# Patient Record
Sex: Female | Born: 1935 | Race: Black or African American | Hispanic: No | Smoking: Never smoker
Health system: Southern US, Community
[De-identification: ages and names within clinical notes are randomized; demographics above are authoritative.]

## PROBLEM LIST (undated history)

## (undated) DIAGNOSIS — G4733 Obstructive sleep apnea (adult) (pediatric): Secondary | ICD-10-CM

## (undated) DIAGNOSIS — G4730 Sleep apnea, unspecified: Secondary | ICD-10-CM

## (undated) DIAGNOSIS — I639 Cerebral infarction, unspecified: Secondary | ICD-10-CM

## (undated) DIAGNOSIS — D649 Anemia, unspecified: Secondary | ICD-10-CM

## (undated) DIAGNOSIS — E119 Type 2 diabetes mellitus without complications: Secondary | ICD-10-CM

## (undated) DIAGNOSIS — I272 Pulmonary hypertension, unspecified: Secondary | ICD-10-CM

## (undated) DIAGNOSIS — G43909 Migraine, unspecified, not intractable, without status migrainosus: Secondary | ICD-10-CM

## (undated) DIAGNOSIS — F329 Major depressive disorder, single episode, unspecified: Secondary | ICD-10-CM

## (undated) DIAGNOSIS — M1990 Unspecified osteoarthritis, unspecified site: Secondary | ICD-10-CM

## (undated) DIAGNOSIS — I10 Essential (primary) hypertension: Secondary | ICD-10-CM

## (undated) DIAGNOSIS — M545 Low back pain, unspecified: Secondary | ICD-10-CM

## (undated) DIAGNOSIS — C50911 Malignant neoplasm of unspecified site of right female breast: Secondary | ICD-10-CM

## (undated) DIAGNOSIS — F32A Depression, unspecified: Secondary | ICD-10-CM

## (undated) DIAGNOSIS — I739 Peripheral vascular disease, unspecified: Secondary | ICD-10-CM

## (undated) DIAGNOSIS — Z950 Presence of cardiac pacemaker: Secondary | ICD-10-CM

## (undated) DIAGNOSIS — M810 Age-related osteoporosis without current pathological fracture: Secondary | ICD-10-CM

## (undated) DIAGNOSIS — G8929 Other chronic pain: Secondary | ICD-10-CM

## (undated) DIAGNOSIS — Z9989 Dependence on other enabling machines and devices: Principal | ICD-10-CM

## (undated) DIAGNOSIS — E785 Hyperlipidemia, unspecified: Secondary | ICD-10-CM

## (undated) DIAGNOSIS — F419 Anxiety disorder, unspecified: Secondary | ICD-10-CM

## (undated) DIAGNOSIS — N952 Postmenopausal atrophic vaginitis: Secondary | ICD-10-CM

## (undated) HISTORY — DX: Postmenopausal atrophic vaginitis: N95.2

## (undated) HISTORY — PX: CATARACT EXTRACTION W/ INTRAOCULAR LENS  IMPLANT, BILATERAL: SHX1307

## (undated) HISTORY — PX: BREAST LUMPECTOMY: SHX2

## (undated) HISTORY — DX: Anemia, unspecified: D64.9

## (undated) HISTORY — DX: Age-related osteoporosis without current pathological fracture: M81.0

## (undated) HISTORY — DX: Pulmonary hypertension, unspecified: I27.20

## (undated) HISTORY — DX: Cerebral infarction, unspecified: I63.9

## (undated) HISTORY — DX: Sleep apnea, unspecified: G47.30

## (undated) HISTORY — DX: Hyperlipidemia, unspecified: E78.5

## (undated) HISTORY — DX: Obstructive sleep apnea (adult) (pediatric): G47.33

## (undated) HISTORY — DX: Dependence on other enabling machines and devices: Z99.89

## (undated) HISTORY — DX: Peripheral vascular disease, unspecified: I73.9

---

## 1999-12-24 DIAGNOSIS — C50911 Malignant neoplasm of unspecified site of right female breast: Secondary | ICD-10-CM

## 1999-12-24 HISTORY — DX: Malignant neoplasm of unspecified site of right female breast: C50.911

## 2000-07-26 HISTORY — PX: BREAST LUMPECTOMY: SHX2

## 2000-10-31 ENCOUNTER — Encounter: Admission: RE | Admit: 2000-10-31 | Discharge: 2000-10-31 | Payer: Self-pay | Admitting: General Surgery

## 2000-10-31 ENCOUNTER — Other Ambulatory Visit: Admission: RE | Admit: 2000-10-31 | Discharge: 2000-10-31 | Payer: Self-pay | Admitting: General Surgery

## 2000-10-31 ENCOUNTER — Encounter: Payer: Self-pay | Admitting: General Surgery

## 2000-10-31 ENCOUNTER — Encounter (INDEPENDENT_AMBULATORY_CARE_PROVIDER_SITE_OTHER): Payer: Self-pay | Admitting: Specialist

## 2000-11-11 ENCOUNTER — Encounter: Payer: Self-pay | Admitting: General Surgery

## 2000-11-11 ENCOUNTER — Ambulatory Visit (HOSPITAL_BASED_OUTPATIENT_CLINIC_OR_DEPARTMENT_OTHER): Admission: RE | Admit: 2000-11-11 | Discharge: 2000-11-12 | Payer: Self-pay | Admitting: General Surgery

## 2000-11-11 ENCOUNTER — Encounter (INDEPENDENT_AMBULATORY_CARE_PROVIDER_SITE_OTHER): Payer: Self-pay | Admitting: *Deleted

## 2001-05-05 ENCOUNTER — Ambulatory Visit: Admission: RE | Admit: 2001-05-05 | Discharge: 2001-08-03 | Payer: Self-pay | Admitting: Radiation Oncology

## 2001-06-29 ENCOUNTER — Ambulatory Visit (HOSPITAL_COMMUNITY): Admission: RE | Admit: 2001-06-29 | Discharge: 2001-06-29 | Payer: Self-pay | Admitting: Radiation Oncology

## 2001-09-10 ENCOUNTER — Encounter: Payer: Self-pay | Admitting: *Deleted

## 2001-09-10 ENCOUNTER — Encounter: Admission: RE | Admit: 2001-09-10 | Discharge: 2001-09-10 | Payer: Self-pay | Admitting: *Deleted

## 2001-10-27 ENCOUNTER — Encounter: Payer: Self-pay | Admitting: *Deleted

## 2001-10-27 ENCOUNTER — Encounter: Admission: RE | Admit: 2001-10-27 | Discharge: 2001-10-27 | Payer: Self-pay | Admitting: *Deleted

## 2002-04-27 ENCOUNTER — Encounter: Admission: RE | Admit: 2002-04-27 | Discharge: 2002-04-27 | Payer: Self-pay | Admitting: Internal Medicine

## 2002-04-27 ENCOUNTER — Encounter (HOSPITAL_BASED_OUTPATIENT_CLINIC_OR_DEPARTMENT_OTHER): Payer: Self-pay | Admitting: Internal Medicine

## 2002-11-02 ENCOUNTER — Encounter: Payer: Self-pay | Admitting: *Deleted

## 2002-11-02 ENCOUNTER — Encounter: Admission: RE | Admit: 2002-11-02 | Discharge: 2002-11-02 | Payer: Self-pay | Admitting: *Deleted

## 2003-01-26 ENCOUNTER — Encounter: Payer: Self-pay | Admitting: *Deleted

## 2003-01-26 ENCOUNTER — Ambulatory Visit (HOSPITAL_COMMUNITY): Admission: RE | Admit: 2003-01-26 | Discharge: 2003-01-26 | Payer: Self-pay | Admitting: *Deleted

## 2003-11-04 ENCOUNTER — Encounter: Admission: RE | Admit: 2003-11-04 | Discharge: 2003-11-04 | Payer: Self-pay | Admitting: Oncology

## 2004-04-19 ENCOUNTER — Other Ambulatory Visit: Admission: RE | Admit: 2004-04-19 | Discharge: 2004-04-19 | Payer: Self-pay | Admitting: Obstetrics and Gynecology

## 2004-09-24 ENCOUNTER — Encounter: Admission: RE | Admit: 2004-09-24 | Discharge: 2004-09-24 | Payer: Self-pay | Admitting: Oncology

## 2004-09-27 ENCOUNTER — Ambulatory Visit (HOSPITAL_COMMUNITY): Admission: RE | Admit: 2004-09-27 | Discharge: 2004-09-27 | Payer: Self-pay | Admitting: Oncology

## 2004-10-04 ENCOUNTER — Encounter: Admission: RE | Admit: 2004-10-04 | Discharge: 2004-10-04 | Payer: Self-pay | Admitting: Oncology

## 2004-10-16 ENCOUNTER — Other Ambulatory Visit: Admission: RE | Admit: 2004-10-16 | Discharge: 2004-10-16 | Payer: Self-pay | Admitting: Obstetrics and Gynecology

## 2004-10-25 ENCOUNTER — Ambulatory Visit: Payer: Self-pay | Admitting: Oncology

## 2004-11-05 ENCOUNTER — Encounter: Admission: RE | Admit: 2004-11-05 | Discharge: 2004-11-05 | Payer: Self-pay | Admitting: Oncology

## 2004-12-13 ENCOUNTER — Ambulatory Visit: Payer: Self-pay | Admitting: Oncology

## 2005-04-10 ENCOUNTER — Ambulatory Visit: Payer: Self-pay | Admitting: Hematology & Oncology

## 2005-04-22 ENCOUNTER — Other Ambulatory Visit: Admission: RE | Admit: 2005-04-22 | Discharge: 2005-04-22 | Payer: Self-pay | Admitting: Obstetrics and Gynecology

## 2005-06-14 ENCOUNTER — Encounter: Admission: RE | Admit: 2005-06-14 | Discharge: 2005-06-14 | Payer: Self-pay | Admitting: Internal Medicine

## 2005-11-01 ENCOUNTER — Ambulatory Visit: Payer: Self-pay | Admitting: Hematology & Oncology

## 2005-11-15 ENCOUNTER — Encounter: Admission: RE | Admit: 2005-11-15 | Discharge: 2005-11-15 | Payer: Self-pay | Admitting: Hematology & Oncology

## 2006-01-03 ENCOUNTER — Other Ambulatory Visit: Admission: RE | Admit: 2006-01-03 | Discharge: 2006-01-03 | Payer: Self-pay | Admitting: Obstetrics and Gynecology

## 2006-05-01 ENCOUNTER — Ambulatory Visit: Payer: Self-pay | Admitting: Hematology & Oncology

## 2006-05-05 LAB — CBC WITH DIFFERENTIAL/PLATELET
Basophils Absolute: 0 10*3/uL (ref 0.0–0.1)
Eosinophils Absolute: 0.2 10*3/uL (ref 0.0–0.5)
HGB: 11.6 g/dL (ref 11.6–15.9)
LYMPH%: 6.2 % — ABNORMAL LOW (ref 14.0–48.0)
MCV: 88.6 fL (ref 81.0–101.0)
MONO%: 8 % (ref 0.0–13.0)
NEUT#: 6.6 10*3/uL — ABNORMAL HIGH (ref 1.5–6.5)
Platelets: 230 10*3/uL (ref 145–400)
RBC: 3.87 10*6/uL (ref 3.70–5.32)

## 2006-05-05 LAB — COMPREHENSIVE METABOLIC PANEL
Alkaline Phosphatase: 47 U/L (ref 39–117)
BUN: 26 mg/dL — ABNORMAL HIGH (ref 6–23)
Glucose, Bld: 163 mg/dL — ABNORMAL HIGH (ref 70–99)
Total Bilirubin: 0.4 mg/dL (ref 0.3–1.2)

## 2006-05-06 LAB — ABO AND RH: Rh Type: POSITIVE

## 2006-05-26 LAB — CBC WITH DIFFERENTIAL/PLATELET
BASO%: 0.5 % (ref 0.0–2.0)
LYMPH%: 17.9 % (ref 14.0–48.0)
MCHC: 33.1 g/dL (ref 32.0–36.0)
MCV: 90.7 fL (ref 81.0–101.0)
MONO%: 7.5 % (ref 0.0–13.0)
Platelets: 256 10*3/uL (ref 145–400)
RBC: 4.05 10*6/uL (ref 3.70–5.32)

## 2006-06-16 ENCOUNTER — Ambulatory Visit: Payer: Self-pay | Admitting: Hematology & Oncology

## 2006-06-16 LAB — CBC WITH DIFFERENTIAL/PLATELET
BASO%: 0.4 % (ref 0.0–2.0)
EOS%: 2 % (ref 0.0–7.0)
LYMPH%: 18 % (ref 14.0–48.0)
MCH: 30 pg (ref 26.0–34.0)
MCHC: 33.6 g/dL (ref 32.0–36.0)
MONO#: 0.5 10*3/uL (ref 0.1–0.9)
Platelets: 248 10*3/uL (ref 145–400)
RBC: 3.97 10*6/uL (ref 3.70–5.32)
WBC: 6.7 10*3/uL (ref 3.9–10.0)
lymph#: 1.2 10*3/uL (ref 0.9–3.3)

## 2006-07-07 LAB — CBC WITH DIFFERENTIAL/PLATELET
Basophils Absolute: 0 10*3/uL (ref 0.0–0.1)
Eosinophils Absolute: 0.2 10*3/uL (ref 0.0–0.5)
HGB: 12.3 g/dL (ref 11.6–15.9)
LYMPH%: 20.8 % (ref 14.0–48.0)
MCV: 90.9 fL (ref 81.0–101.0)
MONO%: 9 % (ref 0.0–13.0)
NEUT#: 3.6 10*3/uL (ref 1.5–6.5)
NEUT%: 66.6 % (ref 39.6–76.8)
Platelets: 243 10*3/uL (ref 145–400)

## 2006-07-28 ENCOUNTER — Ambulatory Visit: Payer: Self-pay | Admitting: Hematology & Oncology

## 2006-07-28 LAB — CBC WITH DIFFERENTIAL/PLATELET
BASO%: 0.4 % (ref 0.0–2.0)
EOS%: 2.1 % (ref 0.0–7.0)
MCH: 30.1 pg (ref 26.0–34.0)
MCHC: 33.5 g/dL (ref 32.0–36.0)
RBC: 3.96 10*6/uL (ref 3.70–5.32)
RDW: 15.1 % — ABNORMAL HIGH (ref 11.3–14.5)
WBC: 7.3 10*3/uL (ref 3.9–10.0)
lymph#: 1.1 10*3/uL (ref 0.9–3.3)

## 2006-08-18 LAB — CBC WITH DIFFERENTIAL/PLATELET
Basophils Absolute: 0 10*3/uL (ref 0.0–0.1)
EOS%: 2.4 % (ref 0.0–7.0)
Eosinophils Absolute: 0.1 10*3/uL (ref 0.0–0.5)
HGB: 12.6 g/dL (ref 11.6–15.9)
MCH: 30.3 pg (ref 26.0–34.0)
MONO#: 0.5 10*3/uL (ref 0.1–0.9)
NEUT#: 4.2 10*3/uL (ref 1.5–6.5)
RDW: 15.4 % — ABNORMAL HIGH (ref 11.3–14.5)
WBC: 6.1 10*3/uL (ref 3.9–10.0)
lymph#: 1.2 10*3/uL (ref 0.9–3.3)

## 2006-09-08 LAB — CBC WITH DIFFERENTIAL/PLATELET
Basophils Absolute: 0 10*3/uL (ref 0.0–0.1)
Eosinophils Absolute: 0.2 10*3/uL (ref 0.0–0.5)
HGB: 11.9 g/dL (ref 11.6–15.9)
MCV: 89.9 fL (ref 81.0–101.0)
MONO%: 7.4 % (ref 0.0–13.0)
NEUT#: 5.5 10*3/uL (ref 1.5–6.5)
RDW: 15.1 % — ABNORMAL HIGH (ref 11.3–14.5)

## 2006-09-25 ENCOUNTER — Ambulatory Visit: Payer: Self-pay | Admitting: Hematology & Oncology

## 2006-10-20 LAB — CBC WITH DIFFERENTIAL/PLATELET
BASO%: 0.3 % (ref 0.0–2.0)
EOS%: 2.5 % (ref 0.0–7.0)
HCT: 36.4 % (ref 34.8–46.6)
LYMPH%: 15 % (ref 14.0–48.0)
MCH: 30.2 pg (ref 26.0–34.0)
MCHC: 33.7 g/dL (ref 32.0–36.0)
NEUT%: 76.4 % (ref 39.6–76.8)
Platelets: 243 10*3/uL (ref 145–400)
RBC: 4.06 10*6/uL (ref 3.70–5.32)

## 2006-11-06 ENCOUNTER — Ambulatory Visit: Payer: Self-pay | Admitting: Hematology & Oncology

## 2006-11-10 LAB — CBC WITH DIFFERENTIAL/PLATELET
BASO%: 0.5 % (ref 0.0–2.0)
HCT: 35 % (ref 34.8–46.6)
LYMPH%: 18.2 % (ref 14.0–48.0)
MCHC: 33.9 g/dL (ref 32.0–36.0)
MCV: 89.1 fL (ref 81.0–101.0)
MONO#: 0.6 10*3/uL (ref 0.1–0.9)
MONO%: 9.4 % (ref 0.0–13.0)
NEUT%: 68.8 % (ref 39.6–76.8)
Platelets: 239 10*3/uL (ref 145–400)
RBC: 3.93 10*6/uL (ref 3.70–5.32)
WBC: 6.1 10*3/uL (ref 3.9–10.0)

## 2006-11-17 ENCOUNTER — Encounter: Admission: RE | Admit: 2006-11-17 | Discharge: 2006-11-17 | Payer: Self-pay | Admitting: Internal Medicine

## 2006-12-01 LAB — CBC WITH DIFFERENTIAL/PLATELET
Basophils Absolute: 0 10*3/uL (ref 0.0–0.1)
Eosinophils Absolute: 0.2 10*3/uL (ref 0.0–0.5)
HGB: 12.2 g/dL (ref 11.6–15.9)
MCV: 90.6 fL (ref 81.0–101.0)
MONO#: 0.7 10*3/uL (ref 0.1–0.9)
NEUT#: 5.5 10*3/uL (ref 1.5–6.5)
RBC: 4.08 10*6/uL (ref 3.70–5.32)
RDW: 15.4 % — ABNORMAL HIGH (ref 11.3–14.5)
WBC: 7.7 10*3/uL (ref 3.9–10.0)
lymph#: 1.3 10*3/uL (ref 0.9–3.3)

## 2006-12-17 ENCOUNTER — Ambulatory Visit: Payer: Self-pay | Admitting: Hematology & Oncology

## 2006-12-22 LAB — CBC WITH DIFFERENTIAL/PLATELET
Basophils Absolute: 0 10*3/uL (ref 0.0–0.1)
Eosinophils Absolute: 0.2 10*3/uL (ref 0.0–0.5)
HCT: 35.5 % (ref 34.8–46.6)
HGB: 11.8 g/dL (ref 11.6–15.9)
LYMPH%: 13.5 % — ABNORMAL LOW (ref 14.0–48.0)
MCV: 89.6 fL (ref 81.0–101.0)
MONO#: 0.6 10*3/uL (ref 0.1–0.9)
MONO%: 6.7 % (ref 0.0–13.0)
NEUT#: 6.4 10*3/uL (ref 1.5–6.5)
NEUT%: 76.6 % (ref 39.6–76.8)
Platelets: 237 10*3/uL (ref 145–400)
RBC: 3.96 10*6/uL (ref 3.70–5.32)
WBC: 8.4 10*3/uL (ref 3.9–10.0)

## 2007-01-15 LAB — CBC WITH DIFFERENTIAL/PLATELET
BASO%: 0.4 % (ref 0.0–2.0)
Basophils Absolute: 0 10*3/uL (ref 0.0–0.1)
HCT: 39.4 % (ref 34.8–46.6)
HGB: 13.2 g/dL (ref 11.6–15.9)
MONO#: 0.6 10*3/uL (ref 0.1–0.9)
NEUT#: 5.3 10*3/uL (ref 1.5–6.5)
NEUT%: 74.8 % (ref 39.6–76.8)
WBC: 7.1 10*3/uL (ref 3.9–10.0)
lymph#: 1 10*3/uL (ref 0.9–3.3)

## 2007-01-29 ENCOUNTER — Ambulatory Visit: Payer: Self-pay | Admitting: Hematology & Oncology

## 2007-02-02 LAB — CBC WITH DIFFERENTIAL/PLATELET
Basophils Absolute: 0.1 10*3/uL (ref 0.0–0.1)
EOS%: 3.3 % (ref 0.0–7.0)
HCT: 39.5 % (ref 34.8–46.6)
HGB: 13.1 g/dL (ref 11.6–15.9)
MCH: 29.8 pg (ref 26.0–34.0)
MCV: 90.1 fL (ref 81.0–101.0)
NEUT%: 71.3 % (ref 39.6–76.8)
lymph#: 1.4 10*3/uL (ref 0.9–3.3)

## 2007-02-23 LAB — CBC WITH DIFFERENTIAL/PLATELET
Basophils Absolute: 0 10*3/uL (ref 0.0–0.1)
EOS%: 4.1 % (ref 0.0–7.0)
HGB: 12 g/dL (ref 11.6–15.9)
LYMPH%: 19 % (ref 14.0–48.0)
MCH: 29.4 pg (ref 26.0–34.0)
MCV: 88 fL (ref 81.0–101.0)
MONO%: 10.1 % (ref 0.0–13.0)
NEUT%: 66.3 % (ref 39.6–76.8)
Platelets: 217 10*3/uL (ref 145–400)
RDW: 15.1 % — ABNORMAL HIGH (ref 11.3–14.5)

## 2007-02-24 ENCOUNTER — Ambulatory Visit: Payer: Self-pay | Admitting: *Deleted

## 2007-04-30 ENCOUNTER — Ambulatory Visit: Payer: Self-pay | Admitting: Hematology & Oncology

## 2007-05-04 LAB — CBC WITH DIFFERENTIAL/PLATELET
BASO%: 0.4 % (ref 0.0–2.0)
Basophils Absolute: 0 10*3/uL (ref 0.0–0.1)
EOS%: 2.6 % (ref 0.0–7.0)
HGB: 11.9 g/dL (ref 11.6–15.9)
MCH: 29.8 pg (ref 26.0–34.0)
NEUT#: 6.9 10*3/uL — ABNORMAL HIGH (ref 1.5–6.5)
RBC: 3.98 10*6/uL (ref 3.70–5.32)
RDW: 14.7 % — ABNORMAL HIGH (ref 11.3–14.5)
lymph#: 1 10*3/uL (ref 0.9–3.3)

## 2007-05-04 LAB — COMPREHENSIVE METABOLIC PANEL
ALT: 9 U/L (ref 0–35)
AST: 16 U/L (ref 0–37)
Albumin: 4.4 g/dL (ref 3.5–5.2)
Alkaline Phosphatase: 52 U/L (ref 39–117)
BUN: 22 mg/dL (ref 6–23)
Calcium: 9.9 mg/dL (ref 8.4–10.5)
Chloride: 104 mEq/L (ref 96–112)
Potassium: 3.9 mEq/L (ref 3.5–5.3)
Sodium: 140 mEq/L (ref 135–145)
Total Protein: 7.7 g/dL (ref 6.0–8.3)

## 2007-11-03 ENCOUNTER — Emergency Department (HOSPITAL_COMMUNITY): Admission: EM | Admit: 2007-11-03 | Discharge: 2007-11-03 | Payer: Self-pay | Admitting: Emergency Medicine

## 2007-11-24 ENCOUNTER — Encounter: Admission: RE | Admit: 2007-11-24 | Discharge: 2007-11-24 | Payer: Self-pay | Admitting: Internal Medicine

## 2007-11-26 ENCOUNTER — Ambulatory Visit: Payer: Self-pay | Admitting: Hematology & Oncology

## 2007-11-26 LAB — COMPREHENSIVE METABOLIC PANEL
AST: 15 U/L (ref 0–37)
Albumin: 4.2 g/dL (ref 3.5–5.2)
Alkaline Phosphatase: 51 U/L (ref 39–117)
Glucose, Bld: 130 mg/dL — ABNORMAL HIGH (ref 70–99)
Potassium: 4.2 mEq/L (ref 3.5–5.3)
Sodium: 142 mEq/L (ref 135–145)
Total Bilirubin: 0.5 mg/dL (ref 0.3–1.2)
Total Protein: 7.9 g/dL (ref 6.0–8.3)

## 2007-11-26 LAB — CBC WITH DIFFERENTIAL/PLATELET
EOS%: 2.8 % (ref 0.0–7.0)
LYMPH%: 15.7 % (ref 14.0–48.0)
MCH: 29.2 pg (ref 26.0–34.0)
MCHC: 33.3 g/dL (ref 32.0–36.0)
MCV: 87.7 fL (ref 81.0–101.0)
MONO%: 7.1 % (ref 0.0–13.0)
Platelets: 223 10*3/uL (ref 145–400)
RBC: 3.99 10*6/uL (ref 3.70–5.32)
RDW: 14.9 % — ABNORMAL HIGH (ref 11.3–14.5)

## 2007-12-07 ENCOUNTER — Ambulatory Visit (HOSPITAL_COMMUNITY): Admission: RE | Admit: 2007-12-07 | Discharge: 2007-12-07 | Payer: Self-pay | Admitting: Hematology & Oncology

## 2008-02-04 ENCOUNTER — Ambulatory Visit: Payer: Self-pay | Admitting: Hematology & Oncology

## 2008-02-13 ENCOUNTER — Encounter: Admission: RE | Admit: 2008-02-13 | Discharge: 2008-02-13 | Payer: Self-pay | Admitting: Internal Medicine

## 2008-02-21 ENCOUNTER — Emergency Department (HOSPITAL_COMMUNITY): Admission: EM | Admit: 2008-02-21 | Discharge: 2008-02-21 | Payer: Self-pay | Admitting: Emergency Medicine

## 2008-04-12 ENCOUNTER — Other Ambulatory Visit: Admission: RE | Admit: 2008-04-12 | Discharge: 2008-04-12 | Payer: Self-pay | Admitting: Obstetrics & Gynecology

## 2008-05-21 ENCOUNTER — Encounter: Admission: RE | Admit: 2008-05-21 | Discharge: 2008-05-21 | Payer: Self-pay | Admitting: Orthopedic Surgery

## 2008-05-31 ENCOUNTER — Ambulatory Visit: Payer: Self-pay | Admitting: Hematology & Oncology

## 2008-06-02 LAB — CBC WITH DIFFERENTIAL/PLATELET
Basophils Absolute: 0.1 10*3/uL (ref 0.0–0.1)
Eosinophils Absolute: 0.3 10*3/uL (ref 0.0–0.5)
HGB: 12.3 g/dL (ref 11.6–15.9)
LYMPH%: 22.7 % (ref 14.0–48.0)
MCV: 89.6 fL (ref 81.0–101.0)
MONO#: 0.7 10*3/uL (ref 0.1–0.9)
MONO%: 10 % (ref 0.0–13.0)
NEUT#: 4.2 10*3/uL (ref 1.5–6.5)
Platelets: 198 10*3/uL (ref 145–400)
RDW: 13.7 % (ref 11.3–14.5)
WBC: 6.8 10*3/uL (ref 3.9–10.0)

## 2008-06-02 LAB — COMPREHENSIVE METABOLIC PANEL
Albumin: 4.4 g/dL (ref 3.5–5.2)
Alkaline Phosphatase: 52 U/L (ref 39–117)
BUN: 31 mg/dL — ABNORMAL HIGH (ref 6–23)
CO2: 22 mEq/L (ref 19–32)
Glucose, Bld: 111 mg/dL — ABNORMAL HIGH (ref 70–99)
Potassium: 4.3 mEq/L (ref 3.5–5.3)
Sodium: 140 mEq/L (ref 135–145)
Total Protein: 7.7 g/dL (ref 6.0–8.3)

## 2008-06-17 ENCOUNTER — Encounter: Admission: RE | Admit: 2008-06-17 | Discharge: 2008-06-17 | Payer: Self-pay | Admitting: Internal Medicine

## 2008-07-06 ENCOUNTER — Encounter: Admission: RE | Admit: 2008-07-06 | Discharge: 2008-07-06 | Payer: Self-pay | Admitting: Internal Medicine

## 2008-07-19 ENCOUNTER — Encounter: Admission: RE | Admit: 2008-07-19 | Discharge: 2008-07-19 | Payer: Self-pay | Admitting: Internal Medicine

## 2008-08-15 ENCOUNTER — Ambulatory Visit: Payer: Self-pay | Admitting: Hematology & Oncology

## 2008-08-15 LAB — CBC WITH DIFFERENTIAL/PLATELET
Basophils Absolute: 0 10*3/uL (ref 0.0–0.1)
Eosinophils Absolute: 0.1 10*3/uL (ref 0.0–0.5)
HGB: 12 g/dL (ref 11.6–15.9)
MONO#: 0.7 10*3/uL (ref 0.1–0.9)
NEUT#: 4.6 10*3/uL (ref 1.5–6.5)
RBC: 3.82 10*6/uL (ref 3.70–5.32)
RDW: 15.2 % — ABNORMAL HIGH (ref 11.3–14.5)
WBC: 6.6 10*3/uL (ref 3.9–10.0)
lymph#: 1.2 10*3/uL (ref 0.9–3.3)

## 2008-11-29 ENCOUNTER — Encounter: Admission: RE | Admit: 2008-11-29 | Discharge: 2008-11-29 | Payer: Self-pay | Admitting: Internal Medicine

## 2008-12-23 HISTORY — PX: COLONOSCOPY: SHX174

## 2009-04-18 ENCOUNTER — Other Ambulatory Visit: Admission: RE | Admit: 2009-04-18 | Discharge: 2009-04-18 | Payer: Self-pay | Admitting: Obstetrics & Gynecology

## 2009-12-01 ENCOUNTER — Encounter: Admission: RE | Admit: 2009-12-01 | Discharge: 2009-12-01 | Payer: Self-pay | Admitting: Internal Medicine

## 2010-03-02 ENCOUNTER — Encounter: Admission: RE | Admit: 2010-03-02 | Discharge: 2010-03-02 | Payer: Self-pay | Admitting: Internal Medicine

## 2010-04-25 LAB — HM PAP SMEAR: HM Pap smear: NEGATIVE

## 2010-05-01 ENCOUNTER — Encounter: Admission: RE | Admit: 2010-05-01 | Discharge: 2010-06-19 | Payer: Self-pay | Admitting: Neurology

## 2010-12-04 ENCOUNTER — Encounter
Admission: RE | Admit: 2010-12-04 | Discharge: 2010-12-04 | Payer: Self-pay | Source: Home / Self Care | Attending: Internal Medicine | Admitting: Internal Medicine

## 2011-04-21 ENCOUNTER — Emergency Department (HOSPITAL_COMMUNITY): Payer: Medicare Other

## 2011-04-21 ENCOUNTER — Emergency Department (HOSPITAL_COMMUNITY)
Admission: EM | Admit: 2011-04-21 | Discharge: 2011-04-21 | Disposition: A | Discharge status: Home / Self Care | Payer: Medicare Other | Attending: Emergency Medicine | Admitting: Emergency Medicine

## 2011-04-21 DIAGNOSIS — R0602 Shortness of breath: Secondary | ICD-10-CM | POA: Insufficient documentation

## 2011-04-21 DIAGNOSIS — R079 Chest pain, unspecified: Secondary | ICD-10-CM | POA: Insufficient documentation

## 2011-04-21 DIAGNOSIS — M25519 Pain in unspecified shoulder: Secondary | ICD-10-CM | POA: Insufficient documentation

## 2011-04-21 DIAGNOSIS — M62838 Other muscle spasm: Secondary | ICD-10-CM | POA: Insufficient documentation

## 2011-04-21 DIAGNOSIS — R109 Unspecified abdominal pain: Secondary | ICD-10-CM | POA: Insufficient documentation

## 2011-04-21 DIAGNOSIS — Z853 Personal history of malignant neoplasm of breast: Secondary | ICD-10-CM | POA: Insufficient documentation

## 2011-04-21 DIAGNOSIS — E119 Type 2 diabetes mellitus without complications: Secondary | ICD-10-CM | POA: Insufficient documentation

## 2011-04-21 DIAGNOSIS — M549 Dorsalgia, unspecified: Secondary | ICD-10-CM | POA: Insufficient documentation

## 2011-04-21 DIAGNOSIS — I10 Essential (primary) hypertension: Secondary | ICD-10-CM | POA: Insufficient documentation

## 2011-04-21 LAB — CBC
Hemoglobin: 12.9 g/dL (ref 12.0–15.0)
MCHC: 33.9 g/dL (ref 30.0–36.0)
Platelets: 223 10*3/uL (ref 150–400)
RDW: 14.3 % (ref 11.5–15.5)

## 2011-04-21 LAB — URINALYSIS, ROUTINE W REFLEX MICROSCOPIC
Ketones, ur: NEGATIVE mg/dL
Nitrite: NEGATIVE
Specific Gravity, Urine: 1.013 (ref 1.005–1.030)
pH: 7 (ref 5.0–8.0)

## 2011-04-21 LAB — DIFFERENTIAL
Basophils Absolute: 0 10*3/uL (ref 0.0–0.1)
Eosinophils Absolute: 0.2 10*3/uL (ref 0.0–0.7)
Lymphs Abs: 1.3 10*3/uL (ref 0.7–4.0)
Monocytes Relative: 9 % (ref 3–12)

## 2011-04-21 LAB — BASIC METABOLIC PANEL
CO2: 26 mEq/L (ref 19–32)
Calcium: 9.7 mg/dL (ref 8.4–10.5)
GFR calc Af Amer: 56 mL/min — ABNORMAL LOW (ref >60)
GFR calc non Af Amer: 46 mL/min — ABNORMAL LOW (ref >60)
Sodium: 139 mEq/L (ref 135–145)

## 2011-04-21 MED ORDER — IOHEXOL 350 MG/ML SOLN
100.0000 mL | Freq: Once | INTRAVENOUS | Status: AC | PRN
  Administered 2011-04-21: 100 mL via INTRAVENOUS

## 2011-06-24 ENCOUNTER — Encounter: Payer: Self-pay | Admitting: Podiatry

## 2011-06-24 DIAGNOSIS — E119 Type 2 diabetes mellitus without complications: Secondary | ICD-10-CM | POA: Insufficient documentation

## 2011-09-16 LAB — CBC
HCT: 33.6 — ABNORMAL LOW
MCV: 88
Platelets: 184
RDW: 16 — ABNORMAL HIGH

## 2011-09-16 LAB — URINALYSIS, ROUTINE W REFLEX MICROSCOPIC
Hgb urine dipstick: NEGATIVE
Nitrite: NEGATIVE
Specific Gravity, Urine: 1.008
Urobilinogen, UA: 0.2
pH: 5.5

## 2011-09-16 LAB — DIFFERENTIAL
Basophils Absolute: 0
Lymphocytes Relative: 13
Lymphs Abs: 0.7
Monocytes Absolute: 0.7
Monocytes Relative: 14 — ABNORMAL HIGH
Neutro Abs: 3.6

## 2011-09-16 LAB — COMPREHENSIVE METABOLIC PANEL
Albumin: 3.2 — ABNORMAL LOW
BUN: 20
Calcium: 8.6
Creatinine, Ser: 1.37 — ABNORMAL HIGH
GFR calc Af Amer: 45 — ABNORMAL LOW
Total Protein: 6.9

## 2011-09-16 LAB — URINE CULTURE: Colony Count: 1000

## 2011-10-30 ENCOUNTER — Other Ambulatory Visit: Payer: Self-pay | Admitting: Internal Medicine

## 2011-11-05 ENCOUNTER — Other Ambulatory Visit: Payer: Self-pay | Admitting: Internal Medicine

## 2011-11-05 DIAGNOSIS — N644 Mastodynia: Secondary | ICD-10-CM

## 2011-12-13 DIAGNOSIS — I779 Disorder of arteries and arterioles, unspecified: Secondary | ICD-10-CM

## 2011-12-13 HISTORY — DX: Disorder of arteries and arterioles, unspecified: I77.9

## 2011-12-27 DIAGNOSIS — E782 Mixed hyperlipidemia: Secondary | ICD-10-CM | POA: Diagnosis not present

## 2011-12-27 DIAGNOSIS — I10 Essential (primary) hypertension: Secondary | ICD-10-CM | POA: Diagnosis not present

## 2011-12-27 DIAGNOSIS — I739 Peripheral vascular disease, unspecified: Secondary | ICD-10-CM | POA: Diagnosis not present

## 2012-01-13 DIAGNOSIS — M79609 Pain in unspecified limb: Secondary | ICD-10-CM | POA: Diagnosis not present

## 2012-01-13 DIAGNOSIS — B351 Tinea unguium: Secondary | ICD-10-CM | POA: Diagnosis not present

## 2012-01-15 ENCOUNTER — Ambulatory Visit
Admission: RE | Admit: 2012-01-15 | Discharge: 2012-01-15 | Disposition: A | Discharge status: Home / Self Care | Payer: Medicare Other | Source: Ambulatory Visit | Attending: Internal Medicine | Admitting: Internal Medicine

## 2012-01-15 ENCOUNTER — Other Ambulatory Visit: Payer: Self-pay | Admitting: Internal Medicine

## 2012-01-15 DIAGNOSIS — N644 Mastodynia: Secondary | ICD-10-CM

## 2012-01-15 DIAGNOSIS — N63 Unspecified lump in unspecified breast: Secondary | ICD-10-CM | POA: Diagnosis not present

## 2012-01-22 DIAGNOSIS — Z79899 Other long term (current) drug therapy: Secondary | ICD-10-CM | POA: Diagnosis not present

## 2012-03-10 DIAGNOSIS — R269 Unspecified abnormalities of gait and mobility: Secondary | ICD-10-CM | POA: Diagnosis not present

## 2012-03-10 DIAGNOSIS — E1159 Type 2 diabetes mellitus with other circulatory complications: Secondary | ICD-10-CM | POA: Diagnosis not present

## 2012-03-10 DIAGNOSIS — R634 Abnormal weight loss: Secondary | ICD-10-CM | POA: Diagnosis not present

## 2012-03-10 DIAGNOSIS — R300 Dysuria: Secondary | ICD-10-CM | POA: Diagnosis not present

## 2012-04-13 DIAGNOSIS — E1149 Type 2 diabetes mellitus with other diabetic neurological complication: Secondary | ICD-10-CM | POA: Diagnosis not present

## 2012-04-13 DIAGNOSIS — Q828 Other specified congenital malformations of skin: Secondary | ICD-10-CM | POA: Diagnosis not present

## 2012-04-13 DIAGNOSIS — L608 Other nail disorders: Secondary | ICD-10-CM | POA: Diagnosis not present

## 2012-04-28 DIAGNOSIS — E782 Mixed hyperlipidemia: Secondary | ICD-10-CM | POA: Diagnosis not present

## 2012-04-28 DIAGNOSIS — I10 Essential (primary) hypertension: Secondary | ICD-10-CM | POA: Diagnosis not present

## 2012-06-03 DIAGNOSIS — Z01419 Encounter for gynecological examination (general) (routine) without abnormal findings: Secondary | ICD-10-CM | POA: Diagnosis not present

## 2012-06-03 DIAGNOSIS — Z124 Encounter for screening for malignant neoplasm of cervix: Secondary | ICD-10-CM | POA: Diagnosis not present

## 2012-06-24 DIAGNOSIS — I739 Peripheral vascular disease, unspecified: Secondary | ICD-10-CM | POA: Diagnosis not present

## 2012-06-24 DIAGNOSIS — E1159 Type 2 diabetes mellitus with other circulatory complications: Secondary | ICD-10-CM | POA: Diagnosis not present

## 2012-06-24 DIAGNOSIS — R350 Frequency of micturition: Secondary | ICD-10-CM | POA: Diagnosis not present

## 2012-07-13 DIAGNOSIS — M79609 Pain in unspecified limb: Secondary | ICD-10-CM | POA: Diagnosis not present

## 2012-07-13 DIAGNOSIS — B351 Tinea unguium: Secondary | ICD-10-CM | POA: Diagnosis not present

## 2012-08-03 ENCOUNTER — Emergency Department (HOSPITAL_COMMUNITY): Payer: Medicare Other

## 2012-08-03 ENCOUNTER — Encounter (HOSPITAL_COMMUNITY): Payer: Self-pay | Admitting: Emergency Medicine

## 2012-08-03 ENCOUNTER — Emergency Department (HOSPITAL_COMMUNITY)
Admission: EM | Admit: 2012-08-03 | Discharge: 2012-08-03 | Disposition: A | Discharge status: Home / Self Care | Payer: Medicare Other | Attending: Emergency Medicine | Admitting: Emergency Medicine

## 2012-08-03 DIAGNOSIS — I10 Essential (primary) hypertension: Secondary | ICD-10-CM | POA: Diagnosis not present

## 2012-08-03 DIAGNOSIS — M545 Low back pain, unspecified: Secondary | ICD-10-CM | POA: Insufficient documentation

## 2012-08-03 DIAGNOSIS — Z043 Encounter for examination and observation following other accident: Secondary | ICD-10-CM | POA: Diagnosis not present

## 2012-08-03 DIAGNOSIS — E119 Type 2 diabetes mellitus without complications: Secondary | ICD-10-CM | POA: Insufficient documentation

## 2012-08-03 DIAGNOSIS — M549 Dorsalgia, unspecified: Secondary | ICD-10-CM | POA: Diagnosis not present

## 2012-08-03 HISTORY — DX: Essential (primary) hypertension: I10

## 2012-08-03 MED ORDER — OXYCODONE-ACETAMINOPHEN 5-325 MG PO TABS: ORAL_TABLET | ORAL | Status: AC

## 2012-08-03 NOTE — ED Provider Notes (Signed)
History     CSN: [id number]  Arrival date & time 08/03/12  1045   First MD Initiated Contact with Patient 08/03/12 1152      Chief Complaint  Patient presents with  . Fall    7/28 tripped and fell at home  . Back Pain    low back aching pain, stiffness    (Consider location/radiation/quality/duration/timing/severity/associated sxs/prior treatment) HPI  This is an 78 year old female in no acute distress complaining of bilateral low back pain status post trip and fall 2 weeks ago. Patient did not seek medical care at that time and has no imaging. Patient reports a normal distal sensation bilaterally. With no change in bowel or bladder function. Pain is described as severe at 8/10 sharp and exacerbated by weightbearing. She has been taking Tylenol for pain control with little relief.  PCP Dr. Vonita Moss.   Past Medical History  Diagnosis Date  . Diabetes mellitus   . Hypertension   . Cancer     Past Surgical History  Procedure Date  . Breast surgery     Family History  Problem Relation Age of Onset  . Diabetes Other     History  Substance Use Topics  . Smoking status: Never Smoker   . Smokeless tobacco: Not on file  . Alcohol Use: No    OB History    Grav Para Term Preterm Abortions TAB SAB Ect Mult Living                  Review of Systems  Musculoskeletal: Positive for back pain.  Neurological: Negative for weakness.  All other systems reviewed and are negative.    Allergies  Review of patient's allergies indicates no known allergies.  Home Medications   Current Outpatient Rx  Name Route Sig Dispense Refill  . CARVEDILOL 25 MG PO TABS Oral Take 25 mg by mouth 2 (two) times daily with a meal.    . GLIMEPIRIDE 1 MG PO TABS Oral Take 0.5 mg by mouth daily before breakfast.    . LISINOPRIL 20 MG PO TABS Oral Take 20 mg by mouth daily.    Marland Kitchen METFORMIN HCL ER 500 MG PO TB24 Oral Take 500 mg by mouth 2 (two) times daily.    . OMEGA-3-ACID ETHYL ESTERS 1 G  PO CAPS Oral Take 1 g by mouth daily.    Marland Kitchen PRAVASTATIN SODIUM 40 MG PO TABS Oral Take 40 mg by mouth daily.    Marland Kitchen PROMETHAZINE HCL 25 MG PO TABS Oral Take 25 mg by mouth 3 (three) times daily as needed. nausea      BP 125/75  Pulse 58  Temp 97.8 F (36.6 C)  Resp 18  SpO2 98%  Physical Exam  Nursing note and vitals reviewed. Constitutional: She is oriented to person, place, and time. She appears well-developed and well-nourished. No distress.  HENT:  Head: Normocephalic and atraumatic.  Right Ear: External ear normal.  Left Ear: External ear normal.  Mouth/Throat: Oropharynx is clear and moist.  Eyes: Conjunctivae and EOM are normal. Pupils are equal, round, and reactive to light.  Neck: Normal range of motion. Neck supple.  Cardiovascular: Normal rate.   Pulmonary/Chest: Effort normal and breath sounds normal.  Abdominal: Soft. Bowel sounds are normal.  Musculoskeletal: Normal range of motion.       No midline tenderness or step-offs appreciated, straight leg raise is negative bilaterally  Neurological: She is alert and oriented to person, place, and time. She exhibits normal muscle tone.  Coordination normal.       And was with a coordinated gait. Strength is 5 out of 5x4 extremities. Sensation intact to light touch and pinprick x4 extremities  Psychiatric: She has a normal mood and affect.    ED Course  Procedures (including critical care time)  Labs Reviewed - No data to display Dg Lumbar Spine Complete  08/03/2012  *RADIOLOGY REPORT*  Clinical Data: Larey Seat.  Back pain radiating to the right leg  LUMBAR SPINE - COMPLETE 4+ VIEW  Comparison: 02/21/2008  Findings: No evidence of fracture.  Chronic fusion from L3-L5. Adjacent segment degenerative disc disease at L1-2, L2-3 and L5-S1. No subluxation.  Aortic calcification incidentally noted.  IMPRESSION: No acute finding.  Chronic fusion L3-L5.  Chronic degenerative changes above and below that.  These have worsened since 2009.   Original Report Authenticated By: Thomasenia Sales, M.D.     1. Lumbago       MDM  I feel that lumbar sacral spine film is warranted given her age and the trauma that she sustained in light of the fact the pain is severe several weeks after the initial accident. X-rays reveal no fractures but a worsening DJD. I have given the patient Percocet with extensive conversation on fall precautions and have advised her to follow up with her primary care for referral to orthopedics and possible physical therapy as a better option for pain control.  Pt verbalized understanding and agrees with care plan. Outpatient follow-up and return precautions given.            Wynetta Emery, PA-C 08/03/12 1544

## 2012-08-03 NOTE — ED Notes (Signed)
Pt fell one month ago, c/o persistant low back pain

## 2012-08-03 NOTE — ED Provider Notes (Signed)
Medical screening examination/treatment/procedure(s) were performed by non-physician practitioner and as supervising physician I was immediately available for consultation/collaboration.   Gwyneth Sprout, MD 08/03/12 [phone]

## 2012-08-31 DIAGNOSIS — E1159 Type 2 diabetes mellitus with other circulatory complications: Secondary | ICD-10-CM | POA: Diagnosis not present

## 2012-08-31 DIAGNOSIS — L259 Unspecified contact dermatitis, unspecified cause: Secondary | ICD-10-CM | POA: Diagnosis not present

## 2012-08-31 DIAGNOSIS — I10 Essential (primary) hypertension: Secondary | ICD-10-CM | POA: Diagnosis not present

## 2012-10-05 DIAGNOSIS — M79609 Pain in unspecified limb: Secondary | ICD-10-CM | POA: Diagnosis not present

## 2012-10-05 DIAGNOSIS — B351 Tinea unguium: Secondary | ICD-10-CM | POA: Diagnosis not present

## 2012-10-09 DIAGNOSIS — Z1331 Encounter for screening for depression: Secondary | ICD-10-CM | POA: Diagnosis not present

## 2012-10-09 DIAGNOSIS — R030 Elevated blood-pressure reading, without diagnosis of hypertension: Secondary | ICD-10-CM | POA: Diagnosis not present

## 2012-10-09 DIAGNOSIS — R269 Unspecified abnormalities of gait and mobility: Secondary | ICD-10-CM | POA: Diagnosis not present

## 2012-10-09 DIAGNOSIS — E1159 Type 2 diabetes mellitus with other circulatory complications: Secondary | ICD-10-CM | POA: Diagnosis not present

## 2012-11-11 DIAGNOSIS — H6120 Impacted cerumen, unspecified ear: Secondary | ICD-10-CM | POA: Diagnosis not present

## 2012-12-09 ENCOUNTER — Other Ambulatory Visit: Payer: Self-pay | Admitting: Internal Medicine

## 2012-12-09 DIAGNOSIS — Z1231 Encounter for screening mammogram for malignant neoplasm of breast: Secondary | ICD-10-CM

## 2013-01-04 DIAGNOSIS — M79609 Pain in unspecified limb: Secondary | ICD-10-CM | POA: Diagnosis not present

## 2013-01-04 DIAGNOSIS — B351 Tinea unguium: Secondary | ICD-10-CM | POA: Diagnosis not present

## 2013-01-04 DIAGNOSIS — L03039 Cellulitis of unspecified toe: Secondary | ICD-10-CM | POA: Diagnosis not present

## 2013-01-06 DIAGNOSIS — H35039 Hypertensive retinopathy, unspecified eye: Secondary | ICD-10-CM | POA: Diagnosis not present

## 2013-01-06 DIAGNOSIS — H26499 Other secondary cataract, unspecified eye: Secondary | ICD-10-CM | POA: Diagnosis not present

## 2013-01-19 ENCOUNTER — Ambulatory Visit
Admission: RE | Admit: 2013-01-19 | Discharge: 2013-01-19 | Disposition: A | Discharge status: Home / Self Care | Payer: Medicare Other | Source: Ambulatory Visit | Attending: Internal Medicine | Admitting: Internal Medicine

## 2013-01-19 DIAGNOSIS — Z1231 Encounter for screening mammogram for malignant neoplasm of breast: Secondary | ICD-10-CM | POA: Diagnosis not present

## 2013-01-20 ENCOUNTER — Other Ambulatory Visit: Payer: Self-pay | Admitting: Internal Medicine

## 2013-01-20 DIAGNOSIS — R928 Other abnormal and inconclusive findings on diagnostic imaging of breast: Secondary | ICD-10-CM

## 2013-01-22 DIAGNOSIS — E1159 Type 2 diabetes mellitus with other circulatory complications: Secondary | ICD-10-CM | POA: Diagnosis not present

## 2013-01-22 DIAGNOSIS — I10 Essential (primary) hypertension: Secondary | ICD-10-CM | POA: Diagnosis not present

## 2013-02-02 ENCOUNTER — Ambulatory Visit
Admission: RE | Admit: 2013-02-02 | Discharge: 2013-02-02 | Disposition: A | Discharge status: Home / Self Care | Payer: Medicare Other | Source: Ambulatory Visit | Attending: Internal Medicine | Admitting: Internal Medicine

## 2013-02-02 ENCOUNTER — Other Ambulatory Visit: Payer: Self-pay | Admitting: Internal Medicine

## 2013-02-02 DIAGNOSIS — R928 Other abnormal and inconclusive findings on diagnostic imaging of breast: Secondary | ICD-10-CM

## 2013-02-08 ENCOUNTER — Ambulatory Visit
Admission: RE | Admit: 2013-02-08 | Discharge: 2013-02-08 | Disposition: A | Discharge status: Home / Self Care | Payer: Medicare Other | Source: Ambulatory Visit | Attending: Internal Medicine | Admitting: Internal Medicine

## 2013-02-08 DIAGNOSIS — D249 Benign neoplasm of unspecified breast: Secondary | ICD-10-CM | POA: Diagnosis not present

## 2013-02-08 DIAGNOSIS — R928 Other abnormal and inconclusive findings on diagnostic imaging of breast: Secondary | ICD-10-CM

## 2013-03-10 ENCOUNTER — Encounter: Payer: Self-pay | Admitting: *Deleted

## 2013-03-23 DIAGNOSIS — E782 Mixed hyperlipidemia: Secondary | ICD-10-CM | POA: Diagnosis not present

## 2013-03-23 DIAGNOSIS — Z79899 Other long term (current) drug therapy: Secondary | ICD-10-CM | POA: Diagnosis not present

## 2013-03-23 HISTORY — PX: TOENAIL EXCISION: SUR558

## 2013-03-29 DIAGNOSIS — Q828 Other specified congenital malformations of skin: Secondary | ICD-10-CM | POA: Diagnosis not present

## 2013-03-29 DIAGNOSIS — E1149 Type 2 diabetes mellitus with other diabetic neurological complication: Secondary | ICD-10-CM | POA: Diagnosis not present

## 2013-03-29 DIAGNOSIS — M79609 Pain in unspecified limb: Secondary | ICD-10-CM | POA: Diagnosis not present

## 2013-03-29 DIAGNOSIS — B351 Tinea unguium: Secondary | ICD-10-CM | POA: Diagnosis not present

## 2013-04-14 ENCOUNTER — Other Ambulatory Visit (HOSPITAL_COMMUNITY): Payer: Self-pay | Admitting: Cardiovascular Disease

## 2013-04-14 DIAGNOSIS — I739 Peripheral vascular disease, unspecified: Secondary | ICD-10-CM

## 2013-04-19 ENCOUNTER — Ambulatory Visit: Payer: Self-pay

## 2013-04-19 ENCOUNTER — Other Ambulatory Visit: Payer: Self-pay | Admitting: Occupational Medicine

## 2013-04-19 DIAGNOSIS — R7612 Nonspecific reaction to cell mediated immunity measurement of gamma interferon antigen response without active tuberculosis: Secondary | ICD-10-CM

## 2013-04-22 ENCOUNTER — Ambulatory Visit (HOSPITAL_COMMUNITY)
Admission: RE | Admit: 2013-04-22 | Discharge: 2013-04-22 | Disposition: A | Discharge status: Home / Self Care | Payer: Medicare Other | Source: Ambulatory Visit | Attending: Cardiovascular Disease | Admitting: Cardiovascular Disease

## 2013-04-22 DIAGNOSIS — I6529 Occlusion and stenosis of unspecified carotid artery: Secondary | ICD-10-CM | POA: Diagnosis not present

## 2013-04-22 DIAGNOSIS — I739 Peripheral vascular disease, unspecified: Secondary | ICD-10-CM

## 2013-04-26 NOTE — Progress Notes (Signed)
Carotid Duplex Completed. 

## 2013-04-28 DIAGNOSIS — Z6826 Body mass index (BMI) 26.0-26.9, adult: Secondary | ICD-10-CM | POA: Diagnosis not present

## 2013-04-28 DIAGNOSIS — E1159 Type 2 diabetes mellitus with other circulatory complications: Secondary | ICD-10-CM | POA: Diagnosis not present

## 2013-04-28 DIAGNOSIS — I739 Peripheral vascular disease, unspecified: Secondary | ICD-10-CM | POA: Diagnosis not present

## 2013-04-28 DIAGNOSIS — I10 Essential (primary) hypertension: Secondary | ICD-10-CM | POA: Diagnosis not present

## 2013-04-28 DIAGNOSIS — M545 Low back pain: Secondary | ICD-10-CM | POA: Diagnosis not present

## 2013-06-01 ENCOUNTER — Encounter: Payer: Self-pay | Admitting: *Deleted

## 2013-06-02 ENCOUNTER — Ambulatory Visit (INDEPENDENT_AMBULATORY_CARE_PROVIDER_SITE_OTHER): Payer: Medicare Other | Admitting: Cardiovascular Disease

## 2013-06-02 ENCOUNTER — Encounter: Payer: Self-pay | Admitting: Cardiovascular Disease

## 2013-06-02 ENCOUNTER — Encounter (HOSPITAL_COMMUNITY): Payer: Self-pay | Admitting: Cardiovascular Disease

## 2013-06-02 VITALS — BP 120/78 | HR 62 | Resp 18 | Ht 65.0 in | Wt 153.0 lb

## 2013-06-02 DIAGNOSIS — I739 Peripheral vascular disease, unspecified: Secondary | ICD-10-CM

## 2013-06-02 DIAGNOSIS — R079 Chest pain, unspecified: Secondary | ICD-10-CM

## 2013-06-02 DIAGNOSIS — E785 Hyperlipidemia, unspecified: Secondary | ICD-10-CM | POA: Diagnosis not present

## 2013-06-02 DIAGNOSIS — Z79899 Other long term (current) drug therapy: Secondary | ICD-10-CM

## 2013-06-02 DIAGNOSIS — G4733 Obstructive sleep apnea (adult) (pediatric): Secondary | ICD-10-CM

## 2013-06-02 DIAGNOSIS — I10 Essential (primary) hypertension: Secondary | ICD-10-CM

## 2013-06-02 DIAGNOSIS — E119 Type 2 diabetes mellitus without complications: Secondary | ICD-10-CM

## 2013-06-02 NOTE — Patient Instructions (Addendum)
Your physician has requested that you have a lower extremity arterial duplex. This test is an ultrasound of the arteries in the legs. It looks at arterial blood flow in the legs. Allow one hour for Lower Arterial scans. There are no restrictions or special instructions.Vascular Ultrasound Vascular ultrasound is a test to see if you have blood flow problems or clots in your veins. Ultrasound uses harmless sound waves to take pictures of the inside of your body. A device (transducer) is held up against your body to capture these pictures. The continually changing images can be recorded on videotape or film. Diagnostic ultrasound imaging may also be called sonography or ultrasonography. There are different types of vascular ultrasound exams. An aortic ultrasound can show enlargement of the artery (aneurysm) or masses. A carotid ultrasound can show if there is blockage (atherosclerosis) of the large arteries in the neck that supply most of the blood to the brain. Vascular ultrasound may be used almost anywhere throughout the body. Some of the most common sites where it is used are in the neck, heart, abdomen, and legs. There are several types of ultrasound, including:  Continuous wave Doppler ultrasound. This type of ultrasound uses the change in pitch of the sound waves to provide information about blood flow through a blood vessel. Your caregiver listens to the sounds produced by the transducer.  Duplex ultrasound. This type of ultrasound uses standard ultrasound methods to produce a picture of a blood vessel and surrounding organs. In addition, a computer provides added information about the speed and direction of blood flow through the blood vessel. With this type of ultrasound it is possible to see the structures inside the body and to evaluate blood flow within those structures at the same time. Blood flow in individual blood vessels is most commonly evaluated by duplex ultrasound.  Color Doppler  ultrasound. This type of ultrasound uses standard ultrasound methods to produce a picture of a blood vessel. In addition, a computer converts the Doppler sounds into colors that are overlaid on the image of the blood vessel. These colors represent the speed and direction of blood flow through the vessel.  Power Doppler ultrasound. This type of ultrasound is up to 5 times more sensitive than color Doppler ultrasound. Power Doppler ultrasound can also get images that are difficult or impossible to get using standard color Doppler ultrasound. Power Doppler ultrasound is used most commonly to evaluate blood flow through vessels within organs, such as the liver or kidneys. RISKS AND COMPLICATIONS Ultrasound has been used for many years and has never shown any harmful effects. Studies in humans have shown no direct link between the use of ultrasound and future problems from the ultrasound. BEFORE THE PROCEDURE For most Doppler ultrasound exams, no preparation is necessary. Your caregiver may ask you not to eat the morning of your exam if the ultrasound scan involves your upper abdomen. PROCEDURE There is no pain in an ultrasound exam. A gel is applied to your skin and the transducer is then placed on the area to be examined. The gel may feel cool. The gel wipes off easily, but it is a good idea to wear clothing that is easily washable. The images from inside your body are displayed on one or more monitors, which look like small television screens. The room is usually darkened during the exam. This makes it easier to see the images on the monitor. The ultrasound exam should take 30 to 60 minutes. The length of the exam depends on  many things including the portion of your body to be examined and the complexity of your body's anatomy. An ultrasound that looks at hardening of the arteries (arteriosclerosis) may take more scanning time. AFTER THE PROCEDURE You can safely drive home and return to regular activities  immediately after your exam. In many cases, follow-up exams are necessary to check on your condition or your response to therapy. Ask when your test results will be ready. Make sure you get your test results. Document Released: 12/20/2004 Document Revised: 03/02/2012 Document Reviewed: 06/14/2011 [facility] Patient Information 2014 [place], [place].  Your physician recommends that you schedule a follow-up appointment in: 1 year (June 2015) Your physician recommends that you return for lab work in: October 2014

## 2013-06-07 DIAGNOSIS — I10 Essential (primary) hypertension: Secondary | ICD-10-CM | POA: Insufficient documentation

## 2013-06-07 DIAGNOSIS — I739 Peripheral vascular disease, unspecified: Secondary | ICD-10-CM | POA: Insufficient documentation

## 2013-06-07 DIAGNOSIS — G4733 Obstructive sleep apnea (adult) (pediatric): Secondary | ICD-10-CM | POA: Insufficient documentation

## 2013-06-07 DIAGNOSIS — E785 Hyperlipidemia, unspecified: Secondary | ICD-10-CM | POA: Insufficient documentation

## 2013-06-07 NOTE — Assessment & Plan Note (Signed)
Reportedly well controlled

## 2013-06-07 NOTE — Assessment & Plan Note (Signed)
Although the lesion on her left foot appears superficial she clearly has pulse deficits and should undergo lower extremity arterial duplex studies. There is ample evidence that she has peripheral vascular disease with heavy calcification of the abdominal aorta and stenoses of the visceral arteries noted during the abdominal CT scans. She does not describe intermittent claudication but is quite sedentary. There was also evidence of carotid atherosclerosis but without meaningful obstruction by a duplex ultrasound was recently performed in December 2012. She does not have known coronary disease and had a normal nuclear stress test in December of 2012.

## 2013-06-07 NOTE — Assessment & Plan Note (Signed)
Target LDL cholesterol less than 409 preferably less than 70. The most recent lipid profile that I have is from April 1. At that time we recommended she increase her pravastatin dose, but the patient called back saying that she was not sure whether or not she was taking this medication at all. She was subsequently restarted on her statin and will be rechecking a lipid profile.

## 2013-06-07 NOTE — Assessment & Plan Note (Signed)
Well controlled 

## 2013-06-07 NOTE — Progress Notes (Signed)
Patient ID: Aris Everts, female   DOB: [birth year], 79 y.o.   MRN: [id number]      Reason for office visit PAD, HTN, hyperlipidemia followup  Mrs. Cagley is an 79 year old woman with numerous risk factors for atherosclerosis including hyperlipidemia diabetes mellitus and hypertension. She has developed a superficial sore on the left foot , on the lateral surface of her metatarsal area. It is not painful or associated erythema swelling fever or chills, but it is not resolving.  She is known to have peripheral arterial atherosclerotic disease with stenosis of the left subclavian artery, origin of the celiac trunk and mild narrowing of the ostium of the right renal artery, all detected incidentally on CT angiography. 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 She has never complained of angina or intermittent claudication and does not have symptoms suggestive of mesenteric ischemia either.  She thinks her blood sugar is well-controlled. She says a typical morning blood sugar was like the one today at 107. She does not know what her most recent hemoglobin A1c was. When we last checked her lipid profile the LDL cholesterol was higher than desirable but she was not taking her pravastatin (she thinks).  No Known Allergies  Current Outpatient Prescriptions  Medication Sig Dispense Refill  . aspirin 81 MG tablet Take 81 mg by mouth daily.      . calcium-vitamin D (OSCAL WITH D) 500-200 MG-UNIT per tablet Take 1 tablet by mouth 2 (two) times daily.      . carvedilol (COREG) 25 MG tablet Take 25 mg by mouth 2 (two) times daily with a meal.      . co-enzyme Q-10 30 MG capsule Take 30 mg by mouth daily.      Marland Kitchen gabapentin (NEURONTIN) 100 MG capsule       . glimepiride (AMARYL) 1 MG tablet Take 0.5 mg by mouth daily before breakfast.      . lisinopril (PRINIVIL,ZESTRIL) 20 MG tablet Take 20 mg by mouth daily.      . metFORMIN (GLUCOPHAGE-XR) 500 MG 24 hr tablet Take  500 mg by mouth 2 (two) times daily.      Marland Kitchen NIFEdipine (PROCARDIA-XL/ADALAT CC) 30 MG 24 hr tablet Take 30 mg by mouth daily.      Marland Kitchen omega-3 acid ethyl esters (LOVAZA) 1 G capsule Take 1 g by mouth daily.      . pravastatin (PRAVACHOL) 40 MG tablet Take 40 mg by mouth daily.      . promethazine (PHENERGAN) 25 MG tablet Take 25 mg by mouth 3 (three) times daily as needed. nausea      . vitamin B-12 (CYANOCOBALAMIN) 1000 MCG tablet Take 1,000 mcg by mouth daily.       No current facility-administered medications for this visit.    Past Medical History  Diagnosis Date  . Diabetes mellitus   . Hypertension   . Cancer   . Hyperlipemia   . Pulmonary hypertension     moderate echo 11/07/10  . Carotid arterial disease 12/13/11     mild by doppler  . PVD (peripheral vascular disease)   . OSA (obstructive sleep apnea)   . Anemia   . Atrophic vaginitis   . Sleep apnea   . Osteoporosis   . CVA (cerebral vascular accident)     Past Surgical History  Procedure Laterality Date  . Breast surgery      right 07/26/2000  . Breast lumpectomy Right  chemo/radiation  . Breast lumpectomy Left     benign  . Colonoscopy  2010    Family History  Problem Relation Age of Onset  . Diabetes Other   . Diabetes Brother   . Heart failure Daughter 50    History   Social History  . Marital Status: Divorced    Spouse Name: N/A    Number of Children: N/A  . Years of Education: N/A   Occupational History  . Not on file.   Social History Main Topics  . Smoking status: Never Smoker   . Smokeless tobacco: Not on file  . Alcohol Use: No  . Drug Use: Not on file  . Sexually Active: Not on file   Other Topics Concern  . Not on file   Social History Narrative  . No narrative on file    Review of systems: The patient specifically denies any chest pain at rest or with exertion, dyspnea at rest or with exertion, orthopnea, paroxysmal nocturnal dyspnea, syncope, palpitations, focal  neurological deficits, intermittent claudication, lower extremity edema, unexplained weight gain, cough, hemoptysis or wheezing.  The patient also denies abdominal pain, nausea, vomiting, dysphagia, diarrhea, constipation, polyuria, polydipsia, dysuria, hematuria, frequency, urgency, abnormal bleeding or bruising, fever, chills, unexpected weight changes, mood swings, change in skin or hair texture, change in voice quality, auditory or visual problems, allergic reactions or rashes, new musculoskeletal complaints other than usual "aches and pains".   PHYSICAL EXAM BP 120/78  Pulse 62  Resp 18  Ht 5\' 5"  (1.651 m)  Wt 153 lb (69.4 kg)  BMI 25.46 kg/m2  General: Alert, oriented x3, no distress Head: no evidence of trauma, PERRL, EOMI, no exophtalmos or lid lag, no myxedema, no xanthelasma; normal ears, nose and oropharynx Neck: normal jugular venous pulsations and no hepatojugular reflux; brisk carotid pulses without delay and no carotid bruits Chest: clear to auscultation, no signs of consolidation by percussion or palpation, normal fremitus, symmetrical and full respiratory excursions Cardiovascular: normal position and quality of the apical impulse, regular rhythm, normal first and second heart sounds, no murmurs, rubs or gallops Abdomen: no tenderness or distention, no masses by palpation, no abnormal pulsatility or arterial bruits, normal bowel sounds, no hepatosplenomegaly Extremities: no clubbing, cyanosis or edema; 2+ radial, ulnar and brachial pulses bilaterally; 2+ right femoral, posterior tibial and dorsalis pedis pulses; 2+ left femoral, posterior tibial and dorsalis pedis pulses; no subclavian or femoral bruits Neurological: grossly nonfocal   EKG: Sinus rhythm, poor anterior R wave progression unchanged from previous tracings  Lipid Panel  April 1: Total cholesterol 187, TG 53, HDL 64, LDL 112  Creatinine 1.12 normal liver function tests  January 2013: Total cholesterol 114,  TG 55, HDL 41, LDL 62  BMET    Component Value Date/Time   NA 139 04/21/2011 1606   K 3.9 04/21/2011 1606   CL 106 04/21/2011 1606   CO2 26 04/21/2011 1606   GLUCOSE 110* 04/21/2011 1606   BUN 18 04/21/2011 1606   CREATININE 1.13 04/21/2011 1606   CALCIUM 9.7 04/21/2011 1606   GFRNONAA 46* 04/21/2011 1606   GFRAA  Value: 56        The eGFR has been calculated using the MDRD equation. This calculation has not been validated in all clinical situations. eGFR's persistently <60 mL/min signify possible Chronic Kidney Disease.* 04/21/2011 1606     ASSESSMENT AND PLAN PAD (peripheral artery disease) Although the lesion on her left foot appears superficial she clearly has pulse deficits and  should undergo lower extremity arterial duplex studies. There is ample evidence that she has peripheral vascular disease with heavy calcification of the abdominal aorta and stenoses of the visceral arteries noted during the abdominal CT scans. She does not describe intermittent claudication but is quite sedentary. There was also evidence of carotid atherosclerosis but without meaningful obstruction by a duplex ultrasound was recently performed in December 2012. She does not have known coronary disease and had a normal nuclear stress test in December of 2012.  HTN (hypertension) Well controlled  Dyslipidemia Target LDL cholesterol less than 161 preferably less than 70. The most recent lipid profile that I have is from April 1. At that time we recommended she increase her pravastatin dose, but the patient called back saying that she was not sure whether or not she was taking this medication at all. She was subsequently restarted on her statin and will be rechecking a lipid profile.  Diabetes mellitus Reportedly well controlled  Orders Placed This Encounter  Procedures  . Lipid Profile  . Comp Met (CMET)  . EKG 12-Lead  . Lower Extremity Arterial Duplex Bilateral   Meds ordered this encounter  Medications  .  gabapentin (NEURONTIN) 100 MG capsule    Sig:     Shynice Sigel  Thurmon Fair, MD, [facility] [phone] office [phone] pager

## 2013-06-08 ENCOUNTER — Encounter: Payer: Self-pay | Admitting: Nurse Practitioner

## 2013-06-08 ENCOUNTER — Ambulatory Visit: Payer: Self-pay | Admitting: Nurse Practitioner

## 2013-06-08 DIAGNOSIS — Z01419 Encounter for gynecological examination (general) (routine) without abnormal findings: Secondary | ICD-10-CM

## 2013-06-18 ENCOUNTER — Ambulatory Visit (HOSPITAL_COMMUNITY)
Admission: RE | Admit: 2013-06-18 | Discharge: 2013-06-18 | Disposition: A | Discharge status: Home / Self Care | Payer: Medicare Other | Source: Ambulatory Visit | Attending: Cardiovascular Disease | Admitting: Cardiovascular Disease

## 2013-06-18 DIAGNOSIS — I70219 Atherosclerosis of native arteries of extremities with intermittent claudication, unspecified extremity: Secondary | ICD-10-CM

## 2013-06-18 DIAGNOSIS — I739 Peripheral vascular disease, unspecified: Secondary | ICD-10-CM | POA: Insufficient documentation

## 2013-06-18 NOTE — Progress Notes (Signed)
Arterial Duplex Lower Ext. Completed. Trudi Morgenthaler, RDMS, RVT  

## 2013-06-21 DIAGNOSIS — E1149 Type 2 diabetes mellitus with other diabetic neurological complication: Secondary | ICD-10-CM | POA: Diagnosis not present

## 2013-06-21 DIAGNOSIS — M79609 Pain in unspecified limb: Secondary | ICD-10-CM | POA: Diagnosis not present

## 2013-06-21 DIAGNOSIS — B351 Tinea unguium: Secondary | ICD-10-CM | POA: Diagnosis not present

## 2013-07-06 ENCOUNTER — Telehealth: Payer: Self-pay | Admitting: *Deleted

## 2013-07-06 NOTE — Telephone Encounter (Signed)
Abnormal results of dopplers called to patient.  Will make an appt.

## 2013-07-14 ENCOUNTER — Encounter: Payer: Self-pay | Admitting: Cardiovascular Disease

## 2013-07-14 ENCOUNTER — Ambulatory Visit (INDEPENDENT_AMBULATORY_CARE_PROVIDER_SITE_OTHER): Payer: Medicare Other | Admitting: Cardiovascular Disease

## 2013-07-14 VITALS — BP 130/80 | HR 62 | Resp 16 | Ht 65.0 in | Wt 151.3 lb

## 2013-07-14 DIAGNOSIS — E785 Hyperlipidemia, unspecified: Secondary | ICD-10-CM | POA: Diagnosis not present

## 2013-07-14 DIAGNOSIS — I10 Essential (primary) hypertension: Secondary | ICD-10-CM

## 2013-07-14 DIAGNOSIS — I739 Peripheral vascular disease, unspecified: Secondary | ICD-10-CM

## 2013-07-14 DIAGNOSIS — E119 Type 2 diabetes mellitus without complications: Secondary | ICD-10-CM

## 2013-07-14 DIAGNOSIS — R21 Rash and other nonspecific skin eruption: Secondary | ICD-10-CM | POA: Diagnosis not present

## 2013-07-14 NOTE — Patient Instructions (Addendum)
You have been referred to [facility]. Your physician recommends that you schedule a follow-up appointment in: 1 year(July 2015)

## 2013-07-18 ENCOUNTER — Encounter: Payer: Self-pay | Admitting: Cardiovascular Disease

## 2013-07-18 NOTE — Assessment & Plan Note (Signed)
Although she has occlusion of infrapopliteal vessels bilaterally, she has two-vessel runoff and good ABIs, suggesting that vascular supply is not the cause for her foot rash. I recommended that she see a dermatologist. At this point revascularization procedures did not appear to be justified, and I told her that for infrapopliteal vessels these have a much lower likelihood of success especially in the long term. There are no large inflow vessels that required revascularization. The mainstay of treatment is prevention of disease progression by aggressive treatment of diabetes, dyslipidemia and avoidance of tobacco.

## 2013-07-18 NOTE — Progress Notes (Signed)
Patient ID: Donna Owens, female   DOB: 1928-01-29, 79 y.o.   MRN: [id number]     Reason for office visit Peripheral arterial disease  [place] continues to complain of hyperpigmentation of her feet and has developed a large desquamating rash on the external surface of the dorsum of her left foot. She has received some type of cream from her podiatrist but this has not helped. It is not itchy or painful but seems to be enlarging an area.  Her lower showed a duplex ultrasound did show evidence of peripheral arterial disease but this appears to be primarily infrapopliteal. There is only mild plaque in the iliacs and femorals. On the right side she has total occlusion of the anterior tibial with collateral reconstitution of the dorsalis pedis and a high-grade stenosis in the proximal posterior tibial. However the ankle-brachial index is very good at 1.1. On the left side there is occlusion of the posterior tibial artery with an ABI of 0.91.  She does not describe intermittent claudication. She is more troubled by pain in her lower back.    No Known Allergies  Current Outpatient Prescriptions  Medication Sig Dispense Refill  . aspirin 81 MG tablet Take 81 mg by mouth daily.      . calcium-vitamin D (OSCAL WITH D) 500-200 MG-UNIT per tablet Take 1 tablet by mouth 2 (two) times daily.      . carvedilol (COREG) 25 MG tablet Take 25 mg by mouth 2 (two) times daily with a meal.      . co-enzyme Q-10 30 MG capsule Take 30 mg by mouth daily.      Marland Kitchen gabapentin (NEURONTIN) 100 MG capsule 100 mg 2 (two) times daily as needed.       Marland Kitchen glimepiride (AMARYL) 1 MG tablet Take 0.5 mg by mouth daily before breakfast.      . lisinopril (PRINIVIL,ZESTRIL) 20 MG tablet Take 20 mg by mouth daily.      . metFORMIN (GLUCOPHAGE-XR) 500 MG 24 hr tablet Take 500 mg by mouth 2 (two) times daily.      Marland Kitchen NIFEdipine (PROCARDIA-XL/ADALAT CC) 30 MG 24 hr tablet Take 30 mg by mouth daily.      Marland Kitchen omega-3 acid ethyl esters  (LOVAZA) 1 G capsule Take 1 g by mouth daily.      . pravastatin (PRAVACHOL) 40 MG tablet Take 40 mg by mouth daily.      . promethazine (PHENERGAN) 25 MG tablet Take 25 mg by mouth 3 (three) times daily as needed. nausea      . vitamin B-12 (CYANOCOBALAMIN) 1000 MCG tablet Take 1,000 mcg by mouth daily.       No current facility-administered medications for this visit.    Past Medical History  Diagnosis Date  . Diabetes mellitus   . Hypertension   . Cancer   . Hyperlipemia   . Pulmonary hypertension     moderate echo 11/07/10  . Carotid arterial disease 12/13/11     mild by doppler  . PVD (peripheral vascular disease)   . OSA (obstructive sleep apnea)   . Anemia   . Atrophic vaginitis   . Sleep apnea   . Osteoporosis   . CVA (cerebral vascular accident)     Past Surgical History  Procedure Laterality Date  . Breast surgery      right 07/26/2000  . Breast lumpectomy Right     chemo/radiation  . Breast lumpectomy Left     benign  . Colonoscopy  2010    Family History  Problem Relation Age of Onset  . Diabetes Other   . Diabetes Brother   . Heart failure Daughter 48    History   Social History  . Marital Status: Divorced    Spouse Name: N/A    Number of Children: N/A  . Years of Education: N/A   Occupational History  . Not on file.   Social History Main Topics  . Smoking status: Never Smoker   . Smokeless tobacco: Not on file  . Alcohol Use: No  . Drug Use: Not on file  . Sexually Active: Not on file   Other Topics Concern  . Not on file   Social History Narrative  . No narrative on file    Review of systems: The patient specifically denies any chest pain at rest or with exertion, dyspnea at rest or with exertion, orthopnea, paroxysmal nocturnal dyspnea, syncope, palpitations, focal neurological deficits, intermittent claudication, lower extremity edema, unexplained weight gain, cough, hemoptysis or wheezing.  The patient also denies abdominal  pain, nausea, vomiting, dysphagia, diarrhea, constipation, polyuria, polydipsia, dysuria, hematuria, frequency, urgency, abnormal bleeding or bruising, fever, chills, unexpected weight changes, mood swings, change in skin or hair texture, change in voice quality, auditory or visual problems, allergic reactions or rashes, new musculoskeletal complaints other than usual "aches and pains".   PHYSICAL EXAM BP 130/80  Pulse 62  Resp 16  Ht 5\' 5"  (1.651 m)  Wt 151 lb 4.8 oz (68.629 kg)  BMI 25.18 kg/m2  General: Alert, oriented x3, no distress Head: no evidence of trauma, PERRL, EOMI, no exophtalmos or lid lag, no myxedema, no xanthelasma; normal ears, nose and oropharynx Neck: normal jugular venous pulsations and no hepatojugular reflux; brisk carotid pulses without delay and no carotid bruits Chest: clear to auscultation, no signs of consolidation by percussion or palpation, normal fremitus, symmetrical and full respiratory excursions Cardiovascular: normal position and quality of the apical impulse, regular rhythm, normal first and second heart sounds, no murmurs, rubs or gallops Abdomen: no tenderness or distention, no masses by palpation, no abnormal pulsatility or arterial bruits, normal bowel sounds, no hepatosplenomegaly Extremities: no clubbing, cyanosis or edema; 2+ radial, ulnar and brachial pulses bilaterally; 2+ right femoral, posterior tibial and 1+dorsalis pedis pulses; 2+ left femoral, absent posterior tibial and 1+ dorsalis pedis pulses; no subclavian or femoral bruits Neurological: grossly nonfocal   EKG: Sinus rhythm with PACs  Lipid Panel   January 2013 cholesterol 114, triglycerides 55, HDL 41, LDL 62   April 2014 cholesterol 187, triglycerides 53, HDL 64, LDL 112  BMET    Component Value Date/Time   NA 139 04/21/2011 1606   K 3.9 04/21/2011 1606   CL 106 04/21/2011 1606   CO2 26 04/21/2011 1606   GLUCOSE 110* 04/21/2011 1606   BUN 18 04/21/2011 1606   CREATININE 1.13  04/21/2011 1606   CALCIUM 9.7 04/21/2011 1606   GFRNONAA 46* 04/21/2011 1606   GFRAA  Value: 56        The eGFR has been calculated using the MDRD equation. This calculation has not been validated in all clinical situations. eGFR's persistently <60 mL/min signify possible Chronic Kidney Disease.* 04/21/2011 1606     ASSESSMENT AND PLAN Diabetes mellitus Well-controlled per her report hemoglobin A1c 6.5 %  Dyslipidemia Most recent lipid profile just a couple of months ago showed slightly higher than desirable LDL cholesterol at 112. She was not taking her pravastatin appropriate for time. Of note in January  2013 on pravastatin 40 mg daily her LDL was excellent at only 62. She has been more compliant with her medication now. We will recheck a lipid profile in a few months.  HTN (hypertension) Satisfactory control  PAD (peripheral artery disease) Although she has occlusion of infrapopliteal vessels bilaterally, she has two-vessel runoff and good ABIs, suggesting that vascular supply is not the cause for her foot rash. I recommended that she see a dermatologist. At this point revascularization procedures did not appear to be justified, and I told her that for infrapopliteal vessels these have a much lower likelihood of success especially in the long term. There are no large inflow vessels that required revascularization. The mainstay of treatment is prevention of disease progression by aggressive treatment of diabetes, dyslipidemia and avoidance of tobacco.   Orders Placed This Encounter  Procedures  . Ambulatory referral to Dermatology   No orders of the defined types were placed in this encounter.    Junious Silk, MD, [facility] [phone] office [phone] pager

## 2013-07-18 NOTE — Assessment & Plan Note (Signed)
Well-controlled per her report hemoglobin A1c 6.5 %

## 2013-07-18 NOTE — Assessment & Plan Note (Signed)
Satisfactory control. 

## 2013-07-18 NOTE — Assessment & Plan Note (Signed)
Most recent lipid profile just a couple of months ago showed slightly higher than desirable LDL cholesterol at 112. She was not taking her pravastatin appropriate for time. Of note in January 2013 on pravastatin 40 mg daily her LDL was excellent at only 62. She has been more compliant with her medication now. We will recheck a lipid profile in a few months.

## 2013-07-23 ENCOUNTER — Ambulatory Visit (INDEPENDENT_AMBULATORY_CARE_PROVIDER_SITE_OTHER): Payer: Medicare Other | Admitting: Nurse Practitioner

## 2013-07-23 ENCOUNTER — Encounter: Payer: Self-pay | Admitting: Nurse Practitioner

## 2013-07-23 VITALS — BP 114/68 | HR 66 | Resp 12 | Ht 65.25 in | Wt 152.8 lb

## 2013-07-23 DIAGNOSIS — Z124 Encounter for screening for malignant neoplasm of cervix: Secondary | ICD-10-CM

## 2013-07-23 DIAGNOSIS — Z01419 Encounter for gynecological examination (general) (routine) without abnormal findings: Secondary | ICD-10-CM | POA: Diagnosis not present

## 2013-07-23 NOTE — Patient Instructions (Addendum)

## 2013-07-23 NOTE — Progress Notes (Signed)
79 y.o. [id number] Divorced African American Fe here for annual exam. Recent left breast stereotactic biopsy 02/09/13 with benign findings of calcifications. She had no GYN concerns and does not want pelvic exam.  She has problems with circulation of her lower legs and has seen a cardiovascular surgeon for this. She had a recent fall backwards into a storage bin and relayed how she got out - quite funny!  No LMP recorded. Patient is postmenopausal.          Sexually active: no  The current method of family planning is post menopausal status.    Exercising: no  The patient does not participate in regular exercise at present. Smoker:  no  Health Maintenance: Pap:  04/24/2010  negative MMG:  01/19/2013 biopsy 2/14 Colonoscopy:  05/2013 few polyps, repeat within a year to remove poly[ps BMD:   02/2011  Spine -0.2/ left hip neck -1.5/ left total -0.3 (PCP follows) TDaP:  PCP maintains tetanus.  Shingles vaccine needed Labs: PCP maintains all labs and urine. PCP will recheck Vit D   reports that she has never smoked. She has never used smokeless tobacco. She reports that she does not drink alcohol or use illicit drugs.  Past Medical History  Diagnosis Date  . Diabetes mellitus   . Hypertension   . Cancer   . Hyperlipemia   . Pulmonary hypertension     moderate echo 11/07/10  . Carotid arterial disease 12/13/11     mild by doppler  . PVD (peripheral vascular disease)   . OSA (obstructive sleep apnea)   . Anemia   . Atrophic vaginitis   . Sleep apnea   . Osteoporosis   . CVA (cerebral vascular accident)     Past Surgical History  Procedure Laterality Date  . Breast surgery      right 07/26/2000  . Breast lumpectomy Right     chemo/radiation  . Breast lumpectomy Left     benign  . Colonoscopy  2010  . Ingrown toenails      removed in 03/2013    Current Outpatient Prescriptions  Medication Sig Dispense Refill  . acetaminophen (TYLENOL) 500 MG tablet Take 500 mg by mouth every 6 (six)  hours as needed for pain.      Marland Kitchen aspirin 81 MG tablet Take 81 mg by mouth daily.      . calcium-vitamin D (OSCAL WITH D) 500-200 MG-UNIT per tablet Take 1 tablet by mouth 2 (two) times daily.      . carvedilol (COREG) 25 MG tablet Take 25 mg by mouth 2 (two) times daily with a meal.      . co-enzyme Q-10 30 MG capsule Take 30 mg by mouth daily.      Marland Kitchen gabapentin (NEURONTIN) 100 MG capsule 100 mg 2 (two) times daily as needed.       Marland Kitchen lisinopril (PRINIVIL,ZESTRIL) 20 MG tablet Take 20 mg by mouth daily.      . metFORMIN (GLUCOPHAGE-XR) 500 MG 24 hr tablet Take 500 mg by mouth 2 (two) times daily.      Marland Kitchen NIFEdipine (PROCARDIA-XL/ADALAT CC) 30 MG 24 hr tablet Take 30 mg by mouth daily.      . pravastatin (PRAVACHOL) 40 MG tablet Take 40 mg by mouth daily.      . promethazine (PHENERGAN) 25 MG tablet Take 25 mg by mouth 3 (three) times daily as needed. nausea      . vitamin B-12 (CYANOCOBALAMIN) 1000 MCG tablet Take 1,000 mcg by mouth  daily.       No current facility-administered medications for this visit.    Family History  Problem Relation Age of Onset  . Diabetes Other   . Diabetes Brother   . Heart failure Daughter 40    ROS:  Pertinent items are noted in HPI.  Otherwise, a comprehensive ROS was negative.  Exam:   BP 114/68  Pulse 66  Resp 12  Ht 5' 5.25" (1.657 m)  Wt 152 lb 12.8 oz (69.31 kg)  BMI 25.24 kg/m2 Height: 5' 5.25" (165.7 cm)  Ht Readings from Last 3 Encounters:  07/23/13 5' 5.25" (1.657 m)  07/14/13 5\' 5"  (1.651 m)  06/02/13 5\' 5"  (1.651 m)    General appearance: alert, cooperative and appears stated age Head: Normocephalic, without obvious abnormality, atraumatic Neck: no adenopathy, supple, symmetrical, trachea midline and thyroid normal to inspection and palpation Lungs: clear to auscultation bilaterally Breasts: normal appearance, no masses or tenderness, surgical chages and radiation changes on right Heart: regular rate and rhythm Abdomen: soft,  non-tender; no masses,  no organomegaly Extremities: extremities normal, atraumatic, no cyanosis or edema Skin: Skin color, texture, turgor normal. No rashes or lesions Lymph nodes: Cervical, supraclavicular, and axillary nodes normal. No abnormal inguinal nodes palpated Neurologic: Grossly normal   Pelvic: Exam deferred per patient request. States no problems.           A:  Well Woman with normal exam  S/P Right breast cancer 2001 with chemo and radiation  S/P Lumpectomy left 1979  S/P Biopsy Left 01/2013  P:   Pap smear as per guidelines   Mammogram due 01/2014  counseled on breast self exam, adequate intake of calcium and vitamin D, diet and exercise  She is strongly counseled not get on step stools or uneven grounds to prevent falls. She lives independently and tries to use precaution not to fall. return annually or prn  An After Visit Summary was printed and given to the patient.

## 2013-07-26 DIAGNOSIS — L821 Other seborrheic keratosis: Secondary | ICD-10-CM | POA: Diagnosis not present

## 2013-07-26 DIAGNOSIS — L259 Unspecified contact dermatitis, unspecified cause: Secondary | ICD-10-CM | POA: Diagnosis not present

## 2013-07-28 DIAGNOSIS — E785 Hyperlipidemia, unspecified: Secondary | ICD-10-CM | POA: Diagnosis not present

## 2013-07-28 DIAGNOSIS — E1159 Type 2 diabetes mellitus with other circulatory complications: Secondary | ICD-10-CM | POA: Diagnosis not present

## 2013-07-28 NOTE — Progress Notes (Signed)
Encounter reviewed by Dr. Sada Mazzoni Silva.  

## 2013-08-04 DIAGNOSIS — G4733 Obstructive sleep apnea (adult) (pediatric): Secondary | ICD-10-CM | POA: Diagnosis not present

## 2013-08-04 DIAGNOSIS — H6120 Impacted cerumen, unspecified ear: Secondary | ICD-10-CM | POA: Diagnosis not present

## 2013-08-04 DIAGNOSIS — I739 Peripheral vascular disease, unspecified: Secondary | ICD-10-CM | POA: Diagnosis not present

## 2013-08-04 DIAGNOSIS — Z0000 Encounter for general adult medical examination without abnormal findings: Secondary | ICD-10-CM | POA: Diagnosis not present

## 2013-08-04 DIAGNOSIS — E1159 Type 2 diabetes mellitus with other circulatory complications: Secondary | ICD-10-CM | POA: Diagnosis not present

## 2013-08-04 DIAGNOSIS — R269 Unspecified abnormalities of gait and mobility: Secondary | ICD-10-CM | POA: Diagnosis not present

## 2013-08-04 DIAGNOSIS — M545 Low back pain: Secondary | ICD-10-CM | POA: Diagnosis not present

## 2013-08-09 DIAGNOSIS — Z1212 Encounter for screening for malignant neoplasm of rectum: Secondary | ICD-10-CM | POA: Diagnosis not present

## 2013-08-17 DIAGNOSIS — L259 Unspecified contact dermatitis, unspecified cause: Secondary | ICD-10-CM | POA: Diagnosis not present

## 2013-09-13 DIAGNOSIS — E1149 Type 2 diabetes mellitus with other diabetic neurological complication: Secondary | ICD-10-CM | POA: Diagnosis not present

## 2013-09-13 DIAGNOSIS — B351 Tinea unguium: Secondary | ICD-10-CM | POA: Diagnosis not present

## 2013-09-13 DIAGNOSIS — M79609 Pain in unspecified limb: Secondary | ICD-10-CM | POA: Diagnosis not present

## 2013-09-22 DIAGNOSIS — L259 Unspecified contact dermatitis, unspecified cause: Secondary | ICD-10-CM | POA: Diagnosis not present

## 2013-11-06 ENCOUNTER — Other Ambulatory Visit: Payer: Self-pay | Admitting: Cardiovascular Disease

## 2013-12-13 ENCOUNTER — Ambulatory Visit (INDEPENDENT_AMBULATORY_CARE_PROVIDER_SITE_OTHER): Payer: Medicare Other | Admitting: Podiatry

## 2013-12-13 ENCOUNTER — Encounter: Payer: Self-pay | Admitting: Podiatry

## 2013-12-13 VITALS — BP 124/54 | HR 61 | Resp 16

## 2013-12-13 DIAGNOSIS — B351 Tinea unguium: Secondary | ICD-10-CM | POA: Diagnosis not present

## 2013-12-13 DIAGNOSIS — M79609 Pain in unspecified limb: Secondary | ICD-10-CM | POA: Diagnosis not present

## 2013-12-13 NOTE — Patient Instructions (Signed)
Diabetes and Foot Care Diabetes may cause you to have problems because of poor blood supply (circulation) to your feet and legs. This may cause the skin on your feet to become thinner, break easier, and heal more slowly. Your skin may become dry, and the skin may peel and crack. You may also have nerve damage in your legs and feet causing decreased feeling in them. You may not notice minor injuries to your feet that could lead to infections or more serious problems. Taking care of your feet is one of the most important things you can do for yourself.  HOME CARE INSTRUCTIONS  Wear shoes at all times, even in the house. Do not go barefoot. Bare feet are easily injured.  Check your feet daily for blisters, cuts, and redness. If you cannot see the bottom of your feet, use a mirror or ask someone for help.  Wash your feet with warm water (do not use hot water) and mild soap. Then pat your feet and the areas between your toes until they are completely dry. Do not soak your feet as this can dry your skin.  Apply a moisturizing lotion or petroleum jelly (that does not contain alcohol and is unscented) to the skin on your feet and to dry, brittle toenails. Do not apply lotion between your toes.  Trim your toenails straight across. Do not dig under them or around the cuticle. File the edges of your nails with an emery board or nail file.  Do not cut corns or calluses or try to remove them with medicine.  Wear clean socks or stockings every day. Make sure they are not too tight. Do not wear knee-high stockings since they may decrease blood flow to your legs.  Wear shoes that fit properly and have enough cushioning. To break in new shoes, wear them for just a few hours a day. This prevents you from injuring your feet. Always look in your shoes before you put them on to be sure there are no objects inside.  Do not cross your legs. This may decrease the blood flow to your feet.  If you find a minor scrape,  cut, or break in the skin on your feet, keep it and the skin around it clean and dry. These areas may be cleansed with mild soap and water. Do not cleanse the area with peroxide, alcohol, or iodine.  When you remove an adhesive bandage, be sure not to damage the skin around it.  If you have a wound, look at it several times a day to make sure it is healing.  Do not use heating pads or hot water bottles. They may burn your skin. If you have lost feeling in your feet or legs, you may not know it is happening until it is too late.  Make sure your health care provider performs a complete foot exam at least annually or more often if you have foot problems. Report any cuts, sores, or bruises to your health care provider immediately. SEEK MEDICAL CARE IF:   You have an injury that is not healing.  You have cuts or breaks in the skin.  You have an ingrown nail.  You notice redness on your legs or feet.  You feel burning or tingling in your legs or feet.  You have pain or cramps in your legs and feet.  Your legs or feet are numb.  Your feet always feel cold. SEEK IMMEDIATE MEDICAL CARE IF:   There is increasing redness,   swelling, or pain in or around a wound.  There is a red line that goes up your leg.  Pus is coming from a wound.  You develop a fever or as directed by your health care provider.  You notice a bad smell coming from an ulcer or wound. Document Released: 12/06/2000 Document Revised: 08/11/2013 Document Reviewed: 05/18/2013 [employer] Patient Information 2014 [employer]  

## 2013-12-13 NOTE — Progress Notes (Signed)
Subjective:     Patient ID: Donna Owens, female   DOB: 1927-12-27, 80 y.o.   MRN: [id number]  HPI patient presents with painful nailbeds that are thick and impossible for her to cut 1-5 both feet along with long-term diabetes   Review of Systems     Objective:   Physical Exam Neurovascular status unchanged with patient well oriented x3 and nail  disease with thickness 1-5 both feet that are painful when pressed  Assessment:     Mycotic nail infection with pain 1-5 both feet    Plan:     Debridement of painful nailbeds 1-5 both feet with no bleeding noted

## 2014-01-04 DIAGNOSIS — R195 Other fecal abnormalities: Secondary | ICD-10-CM | POA: Diagnosis not present

## 2014-01-12 DIAGNOSIS — K648 Other hemorrhoids: Secondary | ICD-10-CM | POA: Diagnosis not present

## 2014-01-20 ENCOUNTER — Ambulatory Visit (INDEPENDENT_AMBULATORY_CARE_PROVIDER_SITE_OTHER): Payer: Medicare Other | Admitting: Cardiovascular Disease

## 2014-01-20 ENCOUNTER — Encounter: Payer: Self-pay | Admitting: Cardiovascular Disease

## 2014-01-20 VITALS — BP 120/76 | HR 64 | Resp 16 | Ht 65.0 in | Wt 147.9 lb

## 2014-01-20 DIAGNOSIS — E782 Mixed hyperlipidemia: Secondary | ICD-10-CM | POA: Diagnosis not present

## 2014-01-20 DIAGNOSIS — R0789 Other chest pain: Secondary | ICD-10-CM

## 2014-01-20 DIAGNOSIS — Z79899 Other long term (current) drug therapy: Secondary | ICD-10-CM

## 2014-01-20 DIAGNOSIS — R5381 Other malaise: Secondary | ICD-10-CM

## 2014-01-20 DIAGNOSIS — R198 Other specified symptoms and signs involving the digestive system and abdomen: Secondary | ICD-10-CM | POA: Diagnosis not present

## 2014-01-20 DIAGNOSIS — E119 Type 2 diabetes mellitus without complications: Secondary | ICD-10-CM

## 2014-01-20 DIAGNOSIS — R5383 Other fatigue: Secondary | ICD-10-CM

## 2014-01-20 NOTE — Patient Instructions (Signed)
Your physician recommends that you return for lab work in: [place].  Your physician recommends that you return for lab work in: at your  [place].

## 2014-01-25 DIAGNOSIS — H26499 Other secondary cataract, unspecified eye: Secondary | ICD-10-CM | POA: Diagnosis not present

## 2014-01-26 DIAGNOSIS — K648 Other hemorrhoids: Secondary | ICD-10-CM | POA: Diagnosis not present

## 2014-01-29 ENCOUNTER — Encounter: Payer: Self-pay | Admitting: Cardiovascular Disease

## 2014-01-29 NOTE — Progress Notes (Signed)
Patient ID: Donna Owens, female   DOB: 1928-06-03, 80 y.o.   MRN: [id number]     Reason for office visit Follow up PAD and risk factor management  Donna Owens is an 80 year old woman with numerous risk factors for atherosclerosis including hyperlipidemia diabetes mellitus and hypertension. She is known to have peripheral arterial atherosclerotic disease with stenosis of the left subclavian artery, origin of the celiac trunk and mild narrowing of the ostium of the right renal artery, all detected incidentally on CT angiography. 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 She has never complained of angina or intermittent claudication and does not have symptoms suggestive of mesenteric ischemia either.  Since her visit last June, she has had only one episode of chest tightness she relates to fatigue.  It happened at the end of a busy day, but not during actual activity. Occasional dyspnea.  No edema or dizziness.  Tripped and fell 1/22 in the bathroom hitting her right hip and arm causing bruising.  She did not have any alteration in consciousness. she did not go to the ER or doctor   No Known Allergies  Current Outpatient Prescriptions  Medication Sig Dispense Refill  . acetaminophen (TYLENOL) 500 MG tablet Take 500 mg by mouth every 6 (six) hours as needed for pain.      Marland Kitchen aspirin 81 MG tablet Take 81 mg by mouth daily.      . calcium-vitamin D (OSCAL WITH D) 500-200 MG-UNIT per tablet Take 1 tablet by mouth 2 (two) times daily.      . carvedilol (COREG) 25 MG tablet Take 25 mg by mouth 2 (two) times daily with a meal.      . co-enzyme Q-10 30 MG capsule Take 30 mg by mouth daily.      . Ferrous Sulfate (IRON) 325 (65 FE) MG TABS Take 1 tablet by mouth daily.      Marland Kitchen lisinopril (PRINIVIL,ZESTRIL) 20 MG tablet Take 20 mg by mouth daily.      . metFORMIN (GLUCOPHAGE-XR) 500 MG 24 hr tablet Take 500 mg by mouth 2 (two) times daily.      Marland Kitchen NIFEdipine  (PROCARDIA-XL/ADALAT CC) 30 MG 24 hr tablet Take 30 mg by mouth daily.      . pravastatin (PRAVACHOL) 40 MG tablet TAKE 1 TABLET BY MOUTH EVERY EVENING  30 tablet  6  . promethazine (PHENERGAN) 25 MG tablet Take 25 mg by mouth 3 (three) times daily as needed. nausea      . vitamin B-12 (CYANOCOBALAMIN) 1000 MCG tablet Take 1,000 mcg by mouth daily.      . diazepam (VALIUM) 5 MG tablet Take 5 mg by mouth as needed.      . gabapentin (NEURONTIN) 100 MG capsule 100 mg 2 (two) times daily as needed.        No current facility-administered medications for this visit.    Past Medical History  Diagnosis Date  . Diabetes mellitus   . Hypertension   . Cancer   . Hyperlipemia   . Pulmonary hypertension     moderate echo 11/07/10  . Carotid arterial disease 12/13/11     mild by doppler  . PVD (peripheral vascular disease)   . OSA (obstructive sleep apnea)   . Anemia   . Atrophic vaginitis   . Sleep apnea   . Osteoporosis   . CVA (cerebral vascular accident)   . Blood transfusion without reported diagnosis  Past Surgical History  Procedure Laterality Date  . Breast surgery      right 07/26/2000  . Breast lumpectomy Right     chemo/radiation  . Breast lumpectomy Left     benign  . Colonoscopy  2010  . Ingrown toenails      removed in 03/2013    Family History  Problem Relation Age of Onset  . Diabetes Other   . Diabetes Brother   . Heart failure Daughter 16    History   Social History  . Marital Status: Divorced    Spouse Name: N/A    Number of Children: N/A  . Years of Education: N/A   Occupational History  . Not on file.   Social History Main Topics  . Smoking status: Never Smoker   . Smokeless tobacco: Never Used  . Alcohol Use: No  . Drug Use: No  . Sexual Activity: No   Other Topics Concern  . Not on file   Social History Narrative  . No narrative on file    Review of systems: The patient specifically denies any chest pain with exertion, dyspnea  with exertion, orthopnea, paroxysmal nocturnal dyspnea, syncope, palpitations, focal neurological deficits, intermittent claudication, lower extremity edema, unexplained weight gain, cough, hemoptysis or wheezing.  The patient also denies abdominal pain, nausea, vomiting, dysphagia, diarrhea, constipation, polyuria, polydipsia, dysuria, hematuria, frequency, urgency, abnormal bleeding or bruising, fever, chills, unexpected weight changes, mood swings, change in skin or hair texture, change in voice quality, auditory or visual problems, allergic reactions or rashes, new musculoskeletal complaints other than usual "aches and pains".   PHYSICAL EXAM BP 120/76  Pulse 64  Resp 16  Ht '5\' 5"'  (1.651 m)  Wt 67.087 kg (147 lb 14.4 oz)  BMI 24.61 kg/m2 General: Alert, oriented x3, no distress  Head: no evidence of trauma, PERRL, EOMI, no exophtalmos or lid lag, no myxedema, no xanthelasma; normal ears, nose and oropharynx  Neck: normal jugular venous pulsations and no hepatojugular reflux; brisk carotid pulses without delay and no carotid bruits  Chest: clear to auscultation, no signs of consolidation by percussion or palpation, normal fremitus, symmetrical and full respiratory excursions  Cardiovascular: normal position and quality of the apical impulse, regular rhythm, normal first and second heart sounds, no murmurs, rubs or gallops  Abdomen: no tenderness or distention, no masses by palpation, no abnormal pulsatility or arterial bruits, normal bowel sounds, no hepatosplenomegaly  Extremities: no clubbing, cyanosis or edema; 2+ radial, ulnar and brachial pulses bilaterally; 2+ right femoral, posterior tibial and dorsalis pedis pulses; 2+ left femoral, posterior tibial and dorsalis pedis pulses; no subclavian or femoral bruits  Neurological: grossly nonfocal   EKG: Sinus rhythm, poor anterior R wave progression unchanged from previous tracings   BMET    Component Value Date/Time   NA 139 04/21/2011  1606   K 3.9 04/21/2011 1606   CL 106 04/21/2011 1606   CO2 26 04/21/2011 1606   GLUCOSE 110* 04/21/2011 1606   BUN 18 04/21/2011 1606   CREATININE 1.13 04/21/2011 1606   CALCIUM 9.7 04/21/2011 1606   GFRNONAA 46* 04/21/2011 1606   GFRAA  Value: 56        The eGFR has been calculated using the MDRD equation. This calculation has not been validated in all clinical situations. eGFR's persistently <60 mL/min signify possible Chronic Kidney Disease.* 04/21/2011 1606     ASSESSMENT AND PLAN  Her symptoms do not sound compatible with exertional angina, CHF or intermittent claudication. The focus  is on risk factor modification. Time to recheck metabolic risk factors. Meds were not changed. Reevaluate her anemia for which she takes Fe supplements.  Orders Placed This Encounter  Procedures  . Lipid Profile  . Comp Met (CMET)  . CBC  . Hemoglobin A1c  . EKG 12-Lead   Meds ordered this encounter  Medications  . diazepam (VALIUM) 5 MG tablet    Sig: Take 5 mg by mouth as needed.  . Ferrous Sulfate (IRON) 325 (65 FE) MG TABS    Sig: Take 1 tablet by mouth daily.    Holli Humbles, MD, [place] [phone] office [phone] pager

## 2014-02-02 DIAGNOSIS — E119 Type 2 diabetes mellitus without complications: Secondary | ICD-10-CM | POA: Diagnosis not present

## 2014-02-02 DIAGNOSIS — E782 Mixed hyperlipidemia: Secondary | ICD-10-CM | POA: Diagnosis not present

## 2014-02-02 DIAGNOSIS — R5381 Other malaise: Secondary | ICD-10-CM | POA: Diagnosis not present

## 2014-02-02 DIAGNOSIS — Z79899 Other long term (current) drug therapy: Secondary | ICD-10-CM | POA: Diagnosis not present

## 2014-02-02 LAB — COMPREHENSIVE METABOLIC PANEL
ALBUMIN: 3.9 g/dL (ref 3.5–5.2)
ALK PHOS: 46 U/L (ref 39–117)
ALT: 8 U/L (ref 0–35)
AST: 14 U/L (ref 0–37)
BILIRUBIN TOTAL: 0.7 mg/dL (ref 0.2–1.2)
BUN: 21 mg/dL (ref 6–23)
CO2: 26 mEq/L (ref 19–32)
Calcium: 9.3 mg/dL (ref 8.4–10.5)
Chloride: 106 mEq/L (ref 96–112)
Creat: 1.28 mg/dL — ABNORMAL HIGH (ref 0.50–1.10)
GLUCOSE: 92 mg/dL (ref 70–99)
POTASSIUM: 4 meq/L (ref 3.5–5.3)
SODIUM: 140 meq/L (ref 135–145)
TOTAL PROTEIN: 7.1 g/dL (ref 6.0–8.3)

## 2014-02-02 LAB — CBC
HCT: 38.2 % (ref 36.0–46.0)
Hemoglobin: 12.4 g/dL (ref 12.0–15.0)
MCH: 30.1 pg (ref 26.0–34.0)
MCHC: 32.5 g/dL (ref 30.0–36.0)
MCV: 92.7 fL (ref 78.0–100.0)
PLATELETS: 206 10*3/uL (ref 150–400)
RBC: 4.12 MIL/uL (ref 3.87–5.11)
RDW: 14.5 % (ref 11.5–15.5)
WBC: 6.4 10*3/uL (ref 4.0–10.5)

## 2014-02-02 LAB — LIPID PANEL
CHOL/HDL RATIO: 2.6 ratio
Cholesterol: 139 mg/dL (ref 0–200)
HDL: 54 mg/dL (ref >39)
LDL CALC: 76 mg/dL (ref 0–99)
Triglycerides: 47 mg/dL (ref <150)
VLDL: 9 mg/dL (ref 0–40)

## 2014-02-03 LAB — HEMOGLOBIN A1C
HEMOGLOBIN A1C: 5.4 % (ref <5.7)
Mean Plasma Glucose: 108 mg/dL (ref <117)

## 2014-02-09 ENCOUNTER — Other Ambulatory Visit: Payer: Self-pay

## 2014-02-23 DIAGNOSIS — K648 Other hemorrhoids: Secondary | ICD-10-CM | POA: Diagnosis not present

## 2014-03-03 DIAGNOSIS — M545 Low back pain, unspecified: Secondary | ICD-10-CM | POA: Diagnosis not present

## 2014-03-03 DIAGNOSIS — R269 Unspecified abnormalities of gait and mobility: Secondary | ICD-10-CM | POA: Diagnosis not present

## 2014-03-03 DIAGNOSIS — IMO0002 Reserved for concepts with insufficient information to code with codable children: Secondary | ICD-10-CM | POA: Diagnosis not present

## 2014-03-03 DIAGNOSIS — I739 Peripheral vascular disease, unspecified: Secondary | ICD-10-CM | POA: Diagnosis not present

## 2014-03-03 DIAGNOSIS — E1159 Type 2 diabetes mellitus with other circulatory complications: Secondary | ICD-10-CM | POA: Diagnosis not present

## 2014-03-10 ENCOUNTER — Ambulatory Visit: Payer: Medicare Other | Admitting: Podiatry

## 2014-03-10 DIAGNOSIS — M5137 Other intervertebral disc degeneration, lumbosacral region: Secondary | ICD-10-CM | POA: Diagnosis not present

## 2014-03-10 DIAGNOSIS — IMO0001 Reserved for inherently not codable concepts without codable children: Secondary | ICD-10-CM | POA: Diagnosis not present

## 2014-03-10 DIAGNOSIS — M545 Low back pain, unspecified: Secondary | ICD-10-CM | POA: Diagnosis not present

## 2014-03-10 DIAGNOSIS — M47817 Spondylosis without myelopathy or radiculopathy, lumbosacral region: Secondary | ICD-10-CM | POA: Diagnosis not present

## 2014-03-15 ENCOUNTER — Other Ambulatory Visit: Payer: Self-pay

## 2014-03-16 ENCOUNTER — Other Ambulatory Visit: Payer: Self-pay | Admitting: Internal Medicine

## 2014-03-16 DIAGNOSIS — N644 Mastodynia: Secondary | ICD-10-CM

## 2014-03-23 DIAGNOSIS — M47817 Spondylosis without myelopathy or radiculopathy, lumbosacral region: Secondary | ICD-10-CM | POA: Diagnosis not present

## 2014-03-24 ENCOUNTER — Other Ambulatory Visit: Payer: Self-pay

## 2014-03-29 ENCOUNTER — Ambulatory Visit
Admission: RE | Admit: 2014-03-29 | Discharge: 2014-03-29 | Disposition: A | Discharge status: Home / Self Care | Payer: Medicare Other | Source: Ambulatory Visit | Attending: Internal Medicine | Admitting: Internal Medicine

## 2014-03-29 ENCOUNTER — Other Ambulatory Visit: Payer: Self-pay | Admitting: Internal Medicine

## 2014-03-29 DIAGNOSIS — N644 Mastodynia: Secondary | ICD-10-CM

## 2014-03-29 DIAGNOSIS — N6489 Other specified disorders of breast: Secondary | ICD-10-CM | POA: Diagnosis not present

## 2014-03-29 DIAGNOSIS — Z853 Personal history of malignant neoplasm of breast: Secondary | ICD-10-CM | POA: Diagnosis not present

## 2014-03-29 DIAGNOSIS — M47817 Spondylosis without myelopathy or radiculopathy, lumbosacral region: Secondary | ICD-10-CM | POA: Diagnosis not present

## 2014-03-31 ENCOUNTER — Encounter: Payer: Self-pay | Admitting: Podiatry

## 2014-03-31 ENCOUNTER — Ambulatory Visit (INDEPENDENT_AMBULATORY_CARE_PROVIDER_SITE_OTHER): Payer: Medicare Other | Admitting: Podiatry

## 2014-03-31 VITALS — BP 145/64 | HR 60 | Resp 12

## 2014-03-31 DIAGNOSIS — M79609 Pain in unspecified limb: Secondary | ICD-10-CM

## 2014-03-31 DIAGNOSIS — B351 Tinea unguium: Secondary | ICD-10-CM

## 2014-03-31 DIAGNOSIS — M47817 Spondylosis without myelopathy or radiculopathy, lumbosacral region: Secondary | ICD-10-CM | POA: Diagnosis not present

## 2014-04-03 NOTE — Progress Notes (Signed)
Subjective:     Patient ID: Donna Owens, female   DOB: [birth year], 80 y.o.   MRN: [id number]  HPI patient presents with thick nailbeds 1-5 both feet that she is unable to trim and states to get painful as they grow long   Review of Systems     Objective:   Physical Exam Neurovascular status unchanged well oriented x3 with thick brittle nailbeds 1-5 both feet    Assessment:     Mycotic nail infection with pain 1-5 both feet    Plan:     Debridement painful nailbeds 1-5 both feet with no bleeding noted

## 2014-04-06 DIAGNOSIS — M47817 Spondylosis without myelopathy or radiculopathy, lumbosacral region: Secondary | ICD-10-CM | POA: Diagnosis not present

## 2014-04-07 DIAGNOSIS — M5137 Other intervertebral disc degeneration, lumbosacral region: Secondary | ICD-10-CM | POA: Diagnosis not present

## 2014-04-07 DIAGNOSIS — IMO0002 Reserved for concepts with insufficient information to code with codable children: Secondary | ICD-10-CM | POA: Diagnosis not present

## 2014-04-07 DIAGNOSIS — M47817 Spondylosis without myelopathy or radiculopathy, lumbosacral region: Secondary | ICD-10-CM | POA: Diagnosis not present

## 2014-04-08 DIAGNOSIS — M47817 Spondylosis without myelopathy or radiculopathy, lumbosacral region: Secondary | ICD-10-CM | POA: Diagnosis not present

## 2014-04-12 DIAGNOSIS — M47817 Spondylosis without myelopathy or radiculopathy, lumbosacral region: Secondary | ICD-10-CM | POA: Diagnosis not present

## 2014-04-15 DIAGNOSIS — M47817 Spondylosis without myelopathy or radiculopathy, lumbosacral region: Secondary | ICD-10-CM | POA: Diagnosis not present

## 2014-04-20 DIAGNOSIS — M47817 Spondylosis without myelopathy or radiculopathy, lumbosacral region: Secondary | ICD-10-CM | POA: Diagnosis not present

## 2014-04-21 DIAGNOSIS — M545 Low back pain, unspecified: Secondary | ICD-10-CM | POA: Diagnosis not present

## 2014-04-21 DIAGNOSIS — M47817 Spondylosis without myelopathy or radiculopathy, lumbosacral region: Secondary | ICD-10-CM | POA: Diagnosis not present

## 2014-04-21 DIAGNOSIS — M5137 Other intervertebral disc degeneration, lumbosacral region: Secondary | ICD-10-CM | POA: Diagnosis not present

## 2014-04-21 DIAGNOSIS — IMO0002 Reserved for concepts with insufficient information to code with codable children: Secondary | ICD-10-CM | POA: Diagnosis not present

## 2014-04-27 DIAGNOSIS — M4712 Other spondylosis with myelopathy, cervical region: Secondary | ICD-10-CM | POA: Diagnosis not present

## 2014-04-27 DIAGNOSIS — M47817 Spondylosis without myelopathy or radiculopathy, lumbosacral region: Secondary | ICD-10-CM | POA: Diagnosis not present

## 2014-04-27 DIAGNOSIS — IMO0001 Reserved for inherently not codable concepts without codable children: Secondary | ICD-10-CM | POA: Diagnosis not present

## 2014-05-03 DIAGNOSIS — IMO0001 Reserved for inherently not codable concepts without codable children: Secondary | ICD-10-CM | POA: Diagnosis not present

## 2014-05-03 DIAGNOSIS — M4712 Other spondylosis with myelopathy, cervical region: Secondary | ICD-10-CM | POA: Diagnosis not present

## 2014-05-03 DIAGNOSIS — M47817 Spondylosis without myelopathy or radiculopathy, lumbosacral region: Secondary | ICD-10-CM | POA: Diagnosis not present

## 2014-05-05 DIAGNOSIS — M47817 Spondylosis without myelopathy or radiculopathy, lumbosacral region: Secondary | ICD-10-CM | POA: Diagnosis not present

## 2014-05-05 DIAGNOSIS — IMO0001 Reserved for inherently not codable concepts without codable children: Secondary | ICD-10-CM | POA: Diagnosis not present

## 2014-05-05 DIAGNOSIS — M4712 Other spondylosis with myelopathy, cervical region: Secondary | ICD-10-CM | POA: Diagnosis not present

## 2014-05-06 DIAGNOSIS — H26499 Other secondary cataract, unspecified eye: Secondary | ICD-10-CM | POA: Diagnosis not present

## 2014-05-06 DIAGNOSIS — E119 Type 2 diabetes mellitus without complications: Secondary | ICD-10-CM | POA: Diagnosis not present

## 2014-05-13 DIAGNOSIS — IMO0001 Reserved for inherently not codable concepts without codable children: Secondary | ICD-10-CM | POA: Diagnosis not present

## 2014-05-13 DIAGNOSIS — M47817 Spondylosis without myelopathy or radiculopathy, lumbosacral region: Secondary | ICD-10-CM | POA: Diagnosis not present

## 2014-05-13 DIAGNOSIS — M4712 Other spondylosis with myelopathy, cervical region: Secondary | ICD-10-CM | POA: Diagnosis not present

## 2014-05-20 DIAGNOSIS — IMO0001 Reserved for inherently not codable concepts without codable children: Secondary | ICD-10-CM | POA: Diagnosis not present

## 2014-05-20 DIAGNOSIS — M4712 Other spondylosis with myelopathy, cervical region: Secondary | ICD-10-CM | POA: Diagnosis not present

## 2014-05-20 DIAGNOSIS — M47817 Spondylosis without myelopathy or radiculopathy, lumbosacral region: Secondary | ICD-10-CM | POA: Diagnosis not present

## 2014-05-25 DIAGNOSIS — M47817 Spondylosis without myelopathy or radiculopathy, lumbosacral region: Secondary | ICD-10-CM | POA: Diagnosis not present

## 2014-05-25 DIAGNOSIS — M4712 Other spondylosis with myelopathy, cervical region: Secondary | ICD-10-CM | POA: Diagnosis not present

## 2014-05-25 DIAGNOSIS — IMO0001 Reserved for inherently not codable concepts without codable children: Secondary | ICD-10-CM | POA: Diagnosis not present

## 2014-05-26 DIAGNOSIS — I10 Essential (primary) hypertension: Secondary | ICD-10-CM | POA: Diagnosis not present

## 2014-05-26 DIAGNOSIS — I739 Peripheral vascular disease, unspecified: Secondary | ICD-10-CM | POA: Diagnosis not present

## 2014-05-26 DIAGNOSIS — R609 Edema, unspecified: Secondary | ICD-10-CM | POA: Diagnosis not present

## 2014-05-26 DIAGNOSIS — IMO0002 Reserved for concepts with insufficient information to code with codable children: Secondary | ICD-10-CM | POA: Diagnosis not present

## 2014-05-26 DIAGNOSIS — E1159 Type 2 diabetes mellitus with other circulatory complications: Secondary | ICD-10-CM | POA: Diagnosis not present

## 2014-05-27 DIAGNOSIS — R609 Edema, unspecified: Secondary | ICD-10-CM | POA: Diagnosis not present

## 2014-05-31 ENCOUNTER — Other Ambulatory Visit: Payer: Self-pay | Admitting: Cardiovascular Disease

## 2014-05-31 NOTE — Telephone Encounter (Signed)
Rx was sent to pharmacy electronically. 

## 2014-06-03 DIAGNOSIS — M47817 Spondylosis without myelopathy or radiculopathy, lumbosacral region: Secondary | ICD-10-CM | POA: Diagnosis not present

## 2014-06-03 DIAGNOSIS — IMO0001 Reserved for inherently not codable concepts without codable children: Secondary | ICD-10-CM | POA: Diagnosis not present

## 2014-06-03 DIAGNOSIS — M4712 Other spondylosis with myelopathy, cervical region: Secondary | ICD-10-CM | POA: Diagnosis not present

## 2014-06-09 DIAGNOSIS — M545 Low back pain, unspecified: Secondary | ICD-10-CM | POA: Diagnosis not present

## 2014-06-09 DIAGNOSIS — IMO0002 Reserved for concepts with insufficient information to code with codable children: Secondary | ICD-10-CM | POA: Diagnosis not present

## 2014-06-09 DIAGNOSIS — M4712 Other spondylosis with myelopathy, cervical region: Secondary | ICD-10-CM | POA: Diagnosis not present

## 2014-06-09 DIAGNOSIS — IMO0001 Reserved for inherently not codable concepts without codable children: Secondary | ICD-10-CM | POA: Diagnosis not present

## 2014-06-09 DIAGNOSIS — M5137 Other intervertebral disc degeneration, lumbosacral region: Secondary | ICD-10-CM | POA: Diagnosis not present

## 2014-06-09 DIAGNOSIS — M47817 Spondylosis without myelopathy or radiculopathy, lumbosacral region: Secondary | ICD-10-CM | POA: Diagnosis not present

## 2014-06-13 DIAGNOSIS — R609 Edema, unspecified: Secondary | ICD-10-CM | POA: Diagnosis not present

## 2014-06-13 DIAGNOSIS — M1990 Unspecified osteoarthritis, unspecified site: Secondary | ICD-10-CM | POA: Diagnosis not present

## 2014-06-13 DIAGNOSIS — H20019 Primary iridocyclitis, unspecified eye: Secondary | ICD-10-CM | POA: Diagnosis not present

## 2014-06-13 DIAGNOSIS — H43399 Other vitreous opacities, unspecified eye: Secondary | ICD-10-CM | POA: Diagnosis not present

## 2014-06-13 DIAGNOSIS — G4733 Obstructive sleep apnea (adult) (pediatric): Secondary | ICD-10-CM | POA: Diagnosis not present

## 2014-06-13 DIAGNOSIS — E1159 Type 2 diabetes mellitus with other circulatory complications: Secondary | ICD-10-CM | POA: Diagnosis not present

## 2014-06-13 DIAGNOSIS — IMO0002 Reserved for concepts with insufficient information to code with codable children: Secondary | ICD-10-CM | POA: Diagnosis not present

## 2014-06-13 DIAGNOSIS — H26499 Other secondary cataract, unspecified eye: Secondary | ICD-10-CM | POA: Diagnosis not present

## 2014-06-13 DIAGNOSIS — I739 Peripheral vascular disease, unspecified: Secondary | ICD-10-CM | POA: Diagnosis not present

## 2014-06-16 DIAGNOSIS — M4712 Other spondylosis with myelopathy, cervical region: Secondary | ICD-10-CM | POA: Diagnosis not present

## 2014-06-16 DIAGNOSIS — IMO0001 Reserved for inherently not codable concepts without codable children: Secondary | ICD-10-CM | POA: Diagnosis not present

## 2014-06-16 DIAGNOSIS — M47817 Spondylosis without myelopathy or radiculopathy, lumbosacral region: Secondary | ICD-10-CM | POA: Diagnosis not present

## 2014-06-20 ENCOUNTER — Institutional Professional Consult (permissible substitution): Payer: Medicare Other | Admitting: Neurology

## 2014-06-20 ENCOUNTER — Ambulatory Visit (INDEPENDENT_AMBULATORY_CARE_PROVIDER_SITE_OTHER): Payer: Medicare Other | Admitting: Neurology

## 2014-06-20 ENCOUNTER — Encounter (INDEPENDENT_AMBULATORY_CARE_PROVIDER_SITE_OTHER): Payer: Self-pay

## 2014-06-20 ENCOUNTER — Encounter: Payer: Self-pay | Admitting: Neurology

## 2014-06-20 VITALS — BP 153/75 | HR 58 | Ht 65.5 in | Wt 143.0 lb

## 2014-06-20 DIAGNOSIS — H20019 Primary iridocyclitis, unspecified eye: Secondary | ICD-10-CM | POA: Diagnosis not present

## 2014-06-20 DIAGNOSIS — E0842 Diabetes mellitus due to underlying condition with diabetic polyneuropathy: Secondary | ICD-10-CM

## 2014-06-20 DIAGNOSIS — G4731 Primary central sleep apnea: Secondary | ICD-10-CM

## 2014-06-20 DIAGNOSIS — G4730 Sleep apnea, unspecified: Secondary | ICD-10-CM | POA: Diagnosis not present

## 2014-06-20 DIAGNOSIS — E1349 Other specified diabetes mellitus with other diabetic neurological complication: Secondary | ICD-10-CM

## 2014-06-20 DIAGNOSIS — G4733 Obstructive sleep apnea (adult) (pediatric): Secondary | ICD-10-CM | POA: Insufficient documentation

## 2014-06-20 DIAGNOSIS — E1142 Type 2 diabetes mellitus with diabetic polyneuropathy: Secondary | ICD-10-CM | POA: Diagnosis not present

## 2014-06-20 DIAGNOSIS — Z9989 Dependence on other enabling machines and devices: Principal | ICD-10-CM

## 2014-06-20 HISTORY — DX: Obstructive sleep apnea (adult) (pediatric): G47.33

## 2014-06-20 MED ORDER — GABAPENTIN 300 MG PO CAPS: 300.0000 mg | ORAL_CAPSULE | ORAL | Status: DC

## 2014-06-20 NOTE — Progress Notes (Signed)
[facility]  Provider:  Larey Seat, M D  Referring Provider: Leanna Battles, MD Primary Care Physician:  [name], MD  Chief Complaint  Patient presents with  . New Evaluation    Room 11  . Sleep consult    HPI:   Donna Owens is a 80 y.o.  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 She is a  right handed  female , who is seen here as a referral from Dr. Philip Aspen for a re-evaluation of known OSA , treated with CPAP.    The patient's PSG study from 08-16-10. At the time the patient had a history of hypertension, diabetes,  insomnia, headaches,  nocturia , excessive daytime sleepiness and witnessed apnea.  The Epworth score was 14  Points,  the BMI was 24. The AHI was mild at 10.8 mostly consistent of central apnea followed by his obstructive apnea. During REM sleep her AHI escalated to surgery 1.7 per hour snoring was rather mild rash by mouth and was were not clinically significant. The heart rate was slow borderline bradycardic with a possible second-degree heart block.  Positive airway pressure was recommended. The study was interpreted by my colleague Dr. Lonzo Cloud of [facility] .  Donna Owens and I met after a referral form Dr Krista Blue , her primary neurologist  in 2010 or 2011, when she was evaluated for OSA and diagnosed with mild -moderate form of REM dependent OSA.  She brought her CPAP machine today to this visit. She had been out of  compliance , not using CPAP  for about a year. She explained to me that she had a letter from me, explaining she wouldn't need CPAP any longer, given that the diagnosis was that of REM dependent sleep apnea, complex apnea , overall mild degree .  She was diagnosed with diabetes and diabetic neuropathy and her pain is interrupting her sleep. She has ankle edema, a doppler vein test was negative.   She has a new quality of headaches, reportedly affecting the left temple ,  radiating to the left neck. No photophobia, no jaw claudication, no palpable temple induration.  No associated nausea. The HA seems to have a tension headache, improving under PT .   i will re order Pt with a special attention to gait stability and fall prevention.  Her sleep habits have not changed.   Her bedtime starts about 11.00 she likes to watch the weather channel and  news on TV. Her TV is located in her bedroom.  She sometimes reads until she falls asleep, about 30 minutes after going to bed. She wakes twice for nocturia.  She wakes often from leg pain, a condition that doesn't affect her sleep latency much , but causes   sleep fragmentation. She only sleeps 4-5 hours each night. She sometimes naps. The likeliest time for a nap is 3- 4 PM. She drinks rarely ever caffeine.  No ETOH or tobacco use ever.  She wakes not restored nor refreshed , and has neck stiffness, a dry mouth, but no headaches. Her head feels heavy.   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     Review of Systems: Out of a complete 14 system review, the patient complains of only the following symptoms, and all other reviewed systems are negative. Epworth  5   ,FSS 26   ,GDS  6  . 06-20-14   History   Social History  . Marital Status: Divorced    Spouse Name: N/A    Number of Children: 1  . Years of Education: BSN   Occupational History  . RETIRED    Social History Main Topics  . Smoking status: Never Smoker   . Smokeless tobacco: Never Used  . Alcohol Use: No  . Drug Use: No  . Sexual Activity: No   Other Topics Concern  . Not on file   Social History Narrative   Patient is divorced and lives alone.   Patient has one adult daughter.   Patient is retired.   Patient has a BSN degree in nursing.   Patient is right-handed.   Patient drinks one soda per week.    Family History  Problem  Relation Age of Onset  . Diabetes Other   . Diabetes Brother   . Heart failure Daughter 7    Past Medical History  Diagnosis Date  . Diabetes mellitus   . Hypertension   . Cancer   . Hyperlipemia   . Pulmonary hypertension     moderate echo 11/07/10  . Carotid arterial disease 12/13/11     mild by doppler  . PVD (peripheral vascular disease)   . OSA (obstructive sleep apnea)   . Anemia   . Atrophic vaginitis   . Sleep apnea   . Osteoporosis   . CVA (cerebral vascular accident)   . Blood transfusion without reported diagnosis   . OSA on CPAP 06/20/2014    Past Surgical History  Procedure Laterality Date  . Breast surgery      right 07/26/2000  . Breast lumpectomy Right     chemo/radiation  . Breast lumpectomy Left     benign  . Colonoscopy  2010  . Ingrown toenails      removed in 03/2013    Current Outpatient Prescriptions  Medication Sig Dispense Refill  . acetaminophen (TYLENOL) 500 MG tablet Take 500 mg by mouth every 6 (six) hours as needed for pain.      Marland Kitchen aspirin 81 MG tablet Take 81 mg by mouth daily.      . calcium-vitamin D (OSCAL WITH D) 500-200 MG-UNIT per tablet Take 1 tablet by mouth 2 (two) times daily.      . carvedilol (COREG) 25 MG tablet Take 25 mg by mouth 2 (two) times daily with a meal.      . co-enzyme Q-10 30 MG capsule Take 30 mg by mouth daily.      . diazepam (VALIUM) 5 MG tablet Take 5 mg by mouth as needed.      . Ferrous Sulfate (IRON) 325 (65 FE) MG TABS Take 1 tablet by mouth every other day.       . lisinopril (PRINIVIL,ZESTRIL) 20 MG tablet Take 20 mg by mouth daily.      . metFORMIN (GLUCOPHAGE-XR) 500 MG 24 hr tablet Take 500 mg by mouth 2 (two) times daily.      . pravastatin (PRAVACHOL) 40 MG tablet TAKE 1 TABLET BY MOUTH EVERY EVENING  30 tablet  7  . prednisoLONE acetate (PRED FORTE) 1 % ophthalmic suspension 1 drop 2 (two) times daily. In left eye      . promethazine (PHENERGAN) 25 MG tablet Take 25 mg by mouth 3 (three) times  daily as needed. nausea      . vitamin B-12 (CYANOCOBALAMIN) 1000 MCG  tablet Take 1,000 mcg by mouth daily.       No current facility-administered medications for this visit.    Allergies as of 06/20/2014  . (No Known Allergies)    Vitals: BP 153/75  Pulse 58  Ht 5' 5.5" (1.664 m)  Wt 143 lb (64.864 kg)  BMI 23.43 kg/m2 Last Weight:  Wt Readings from Last 1 Encounters:  06/20/14 143 lb (64.864 kg)   Last Height:   Ht Readings from Last 1 Encounters:  06/20/14 5' 5.5" (1.664 m)    Physical exam:  General: The patient is awake, alert and appears not in acute distress. The patient is well groomed. Head: Normocephalic, atraumatic. Neck is supple. Mallampati 4 , neck circumference:13.5 inches  Cardiovascular:  Regular rate and rhythm, slow , without  murmurs or carotid bruit, and without distended neck veins. Respiratory: Lungs are clear to auscultation. Skin:  Without evidence of edema, or rash Trunk:  patient has normal posture.  Neurologic exam : The patient is awake and alert, oriented to place and time.  Memory subjective  described as intact.  There is a normal attention span & concentration ability. Speech is fluent without dysarthria, dysphonia or aphasia. Mood and affect are appropriate.  Cranial nerves: Pupils are equal and briskly reactive to light. Funduscopic exam without  evidence of pallor or edema.  She is status post cataract surgery . Extraocular movements  in vertical and horizontal planes intact and without nystagmus. Visual fields by finger perimetry are intact. Hearing to finger rub intact.  Facial sensation intact to fine touch. Facial motor strength is symmetric and tongue and uvula move midline.  Motor exam:  Normal tone ,muscle bulk and symmetric strength in all extremities. Patient has a strong grip.   Sensory:  Fine touch, pinprick and vibration were tested in all extremities.  Vibration is decreased in both feet beginning at the ankle.   Proprioception is  normal.  Coordination: Rapid alternating movements in the fingers/hands is tested and normal.  Finger-to-nose maneuver  with evidence of ataxia, dysmetria  but not  tremor.  Gait and station: Patient walks without assistive device, climbs up to the exam table.  Strength within normal limits. Stance is wider based , stable .  Steps are unfragmented. Romberg testing is normal- yet the patient had several falls over the last 6 month ( 3 or 4) .  Deep tendon reflexes: in the upper and lower extremities are attenuated, the right knee has been hurting and patella reflex is attenuated.   Babinski maneuver response is downgoing.   Assessment:  After physical and neurologic examination, review of laboratory studies, imaging, neurophysiology testing and pre-existing records, assessment is   1) pain related poorer sleep and cumulative sleep deprivation.  Not sure apnea plays a role.  2) OSA / CSA was mild in 2011, will recheck now after weight loss. The patient gained and lost weight in th time between her last PSG and now.  3) neuropathy related sensory loss and less gait stability, increased fall risk. Decreased proprioception.    Plan:  Treatment plan and additional workup ;  Sleep retesting.  Neurontin for leg pain- recommended dose 300 mg at night only - can be taken 1-2 hours prior to bedtime.  Fall risk assessment with PT

## 2014-06-20 NOTE — Patient Instructions (Signed)
Insomnia Insomnia is frequent trouble falling and/or staying asleep. Insomnia can be a long term problem or a short term problem. Both are common. Insomnia can be a short term problem when the wakefulness is related to a certain stress or worry. Long term insomnia is often related to ongoing stress during waking hours and/or poor sleeping habits. Overtime, sleep deprivation itself can make the problem worse. Every little thing feels more severe because you are overtired and your ability to cope is decreased. CAUSES   Stress, anxiety, and depression.  Poor sleeping habits.  Distractions such as TV in the bedroom.  Naps close to bedtime.  Engaging in emotionally charged conversations before bed.  Technical reading before sleep.  Alcohol and other sedatives. They may make the problem worse. They can hurt normal sleep patterns and normal dream activity.  Stimulants such as caffeine for several hours prior to bedtime.  Pain syndromes and shortness of breath can cause insomnia.  Exercise late at night.  Changing time zones may cause sleeping problems (jet lag). It is sometimes helpful to have someone observe your sleeping patterns. They should look for periods of not breathing during the night (sleep apnea). They should also look to see how long those periods last. If you live alone or observers are uncertain, you can also be observed at a sleep clinic where your sleep patterns will be professionally monitored. Sleep apnea requires a checkup and treatment. Give your caregivers your medical history. Give your caregivers observations your family has made about your sleep.  SYMPTOMS   Not feeling rested in the morning.  Anxiety and restlessness at bedtime.  Difficulty falling and staying asleep. TREATMENT   Your caregiver may prescribe treatment for an underlying medical disorders. Your caregiver can give advice or help if you are using alcohol or other drugs for self-medication. Treatment  of underlying problems will usually eliminate insomnia problems.  Medications can be prescribed for short time use. They are generally not recommended for lengthy use.  Over-the-counter sleep medicines are not recommended for lengthy use. They can be habit forming.  You can promote easier sleeping by making lifestyle changes such as:  Using relaxation techniques that help with breathing and reduce muscle tension.  Exercising earlier in the day.  Changing your diet and the time of your last meal. No night time snacks.  Establish a regular time to go to bed.  Counseling can help with stressful problems and worry.  Soothing music and white noise may be helpful if there are background noises you cannot remove.  Stop tedious detailed work at least one hour before bedtime. HOME CARE INSTRUCTIONS   Keep a diary. Inform your caregiver about your progress. This includes any medication side effects. See your caregiver regularly. Take note of:  Times when you are asleep.  Times when you are awake during the night.  The quality of your sleep.  How you feel the next day. This information will help your caregiver care for you.  Get out of bed if you are still awake after 15 minutes. Read or do some quiet activity. Keep the lights down. Wait until you feel sleepy and go back to bed.  Keep regular sleeping and waking hours. Avoid naps.  Exercise regularly.  Avoid distractions at bedtime. Distractions include watching television or engaging in any intense or detailed activity like attempting to balance the household checkbook.  Develop a bedtime ritual. Keep a familiar routine of bathing, brushing your teeth, climbing into bed at the same   time each night, listening to soothing music. Routines increase the success of falling to sleep faster.  Use relaxation techniques. This can be using breathing and muscle tension release routines. It can also include visualizing peaceful scenes. You can  also help control troubling or intruding thoughts by keeping your mind occupied with boring or repetitive thoughts like the old concept of counting sheep. You can make it more creative like imagining planting one beautiful flower after another in your backyard garden.  During your day, work to eliminate stress. When this is not possible use some of the previous suggestions to help reduce the anxiety that accompanies stressful situations. MAKE SURE YOU:   Understand these instructions.  Will watch your condition.  Will get help right away if you are not doing well or get worse. Document Released: 12/06/2000 Document Revised: 03/02/2012 Document Reviewed: 01/06/2008 Exit[employer] Patient Information 2015 [employer]. This information is not intended to replace advice given to you by your health care Tasheika Kitzmiller. Make sure you discuss any questions you have with your health care Michie Molnar.  

## 2014-06-22 DIAGNOSIS — G4733 Obstructive sleep apnea (adult) (pediatric): Secondary | ICD-10-CM | POA: Diagnosis not present

## 2014-06-22 DIAGNOSIS — R609 Edema, unspecified: Secondary | ICD-10-CM | POA: Diagnosis not present

## 2014-06-22 DIAGNOSIS — I4892 Unspecified atrial flutter: Secondary | ICD-10-CM | POA: Diagnosis not present

## 2014-06-22 DIAGNOSIS — E1159 Type 2 diabetes mellitus with other circulatory complications: Secondary | ICD-10-CM | POA: Diagnosis not present

## 2014-06-22 DIAGNOSIS — IMO0001 Reserved for inherently not codable concepts without codable children: Secondary | ICD-10-CM | POA: Diagnosis not present

## 2014-06-22 DIAGNOSIS — M47817 Spondylosis without myelopathy or radiculopathy, lumbosacral region: Secondary | ICD-10-CM | POA: Diagnosis not present

## 2014-06-22 DIAGNOSIS — M4712 Other spondylosis with myelopathy, cervical region: Secondary | ICD-10-CM | POA: Diagnosis not present

## 2014-06-29 ENCOUNTER — Encounter: Payer: Self-pay | Admitting: Cardiology

## 2014-06-29 ENCOUNTER — Ambulatory Visit (INDEPENDENT_AMBULATORY_CARE_PROVIDER_SITE_OTHER): Payer: Medicare Other | Admitting: Cardiology

## 2014-06-29 VITALS — BP 158/78 | HR 58 | Ht 65.0 in | Wt 142.4 lb

## 2014-06-29 DIAGNOSIS — R079 Chest pain, unspecified: Secondary | ICD-10-CM

## 2014-06-29 DIAGNOSIS — R5383 Other fatigue: Secondary | ICD-10-CM | POA: Diagnosis not present

## 2014-06-29 DIAGNOSIS — Z7901 Long term (current) use of anticoagulants: Secondary | ICD-10-CM

## 2014-06-29 DIAGNOSIS — R5381 Other malaise: Secondary | ICD-10-CM

## 2014-06-29 DIAGNOSIS — Z9989 Dependence on other enabling machines and devices: Secondary | ICD-10-CM

## 2014-06-29 DIAGNOSIS — Z79899 Other long term (current) drug therapy: Secondary | ICD-10-CM

## 2014-06-29 DIAGNOSIS — I483 Typical atrial flutter: Secondary | ICD-10-CM

## 2014-06-29 DIAGNOSIS — IMO0001 Reserved for inherently not codable concepts without codable children: Secondary | ICD-10-CM | POA: Diagnosis not present

## 2014-06-29 DIAGNOSIS — I4892 Unspecified atrial flutter: Secondary | ICD-10-CM

## 2014-06-29 DIAGNOSIS — M47817 Spondylosis without myelopathy or radiculopathy, lumbosacral region: Secondary | ICD-10-CM | POA: Diagnosis not present

## 2014-06-29 DIAGNOSIS — I10 Essential (primary) hypertension: Secondary | ICD-10-CM

## 2014-06-29 DIAGNOSIS — I209 Angina pectoris, unspecified: Secondary | ICD-10-CM

## 2014-06-29 DIAGNOSIS — G4733 Obstructive sleep apnea (adult) (pediatric): Secondary | ICD-10-CM

## 2014-06-29 DIAGNOSIS — M4712 Other spondylosis with myelopathy, cervical region: Secondary | ICD-10-CM | POA: Diagnosis not present

## 2014-06-29 NOTE — Progress Notes (Signed)
07/03/2014   PCP: [name], MD   Chief Complaint  Patient presents with  . Follow-up    pt c/o flutter in chest started this past weekend     Primary Cardiologist:Dr. Jerilynn Mages. Croitoru   HPI:80 year old woman with numerous risk factors for atherosclerosis including hyperlipidemia diabetes mellitus and hypertension. She is known to have peripheral arterial atherosclerotic disease with stenosis of the left subclavian artery, origin of the celiac trunk and mild narrowing of the ostium of the right renal artery, all detected incidentally on CT angiography. 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 She has never complained of angina or intermittent claudication and does not have symptoms suggestive of mesenteric ischemia either.  She presents today with fluttering in her chest.  Started this past weekend.  No chest pain, no SOB.  Her EKG today reveals a flutter.  Rate is controlled.   No Known Allergies  Current Outpatient Prescriptions  Medication Sig Dispense Refill  . acetaminophen (TYLENOL) 500 MG tablet Take 500 mg by mouth every 6 (six) hours as needed for pain.      Marland Kitchen aspirin 81 MG tablet Take 81 mg by mouth daily.      . calcium-vitamin D (OSCAL WITH D) 500-200 MG-UNIT per tablet Take 1 tablet by mouth 2 (two) times daily.      . carvedilol (COREG) 25 MG tablet Take 25 mg by mouth 2 (two) times daily with a meal.      . co-enzyme Q-10 30 MG capsule Take 30 mg by mouth daily.      . diazepam (VALIUM) 5 MG tablet Take 5 mg by mouth as needed.      . Ferrous Sulfate (IRON) 325 (65 FE) MG TABS Take 1 tablet by mouth every other day.       . gabapentin (NEURONTIN) 300 MG capsule Take 1 capsule (300 mg total) by mouth as directed. Take one at one hour prior to bed time.  90 capsule  1  . lisinopril (PRINIVIL,ZESTRIL) 20 MG tablet Take 20 mg by mouth daily.      . metFORMIN (GLUCOPHAGE-XR) 500 MG 24 hr tablet Take 500 mg by  mouth 2 (two) times daily.      . pravastatin (PRAVACHOL) 40 MG tablet TAKE 1 TABLET BY MOUTH EVERY EVENING  30 tablet  7  . prednisoLONE acetate (PRED FORTE) 1 % ophthalmic suspension 1 drop 2 (two) times daily. In left eye      . promethazine (PHENERGAN) 25 MG tablet Take 25 mg by mouth 3 (three) times daily as needed. nausea      . vitamin B-12 (CYANOCOBALAMIN) 1000 MCG tablet Take 1,000 mcg by mouth daily.       No current facility-administered medications for this visit.    Past Medical History  Diagnosis Date  . Diabetes mellitus   . Hypertension   . Cancer   . Hyperlipemia   . Pulmonary hypertension     moderate echo 11/07/10  . Carotid arterial disease 12/13/11     mild by doppler  . PVD (peripheral vascular disease)   . OSA (obstructive sleep apnea)   . Anemia   . Atrophic vaginitis   . Sleep apnea   . Osteoporosis   . CVA (cerebral vascular accident)   . Blood transfusion without reported diagnosis   . OSA on CPAP 06/20/2014    Past Surgical History  Procedure Laterality Date  .  Breast surgery      right 07/26/2000  . Breast lumpectomy Right     chemo/radiation  . Breast lumpectomy Left     benign  . Colonoscopy  2010  . Ingrown toenails      removed in 03/2013    PVX:YIAXKPV:VZ colds or fevers, no weight changes Skin:no rashes or ulcers HEENT:no blurred vision, no congestion CV:see HPI PUL:see HPI GI:no diarrhea constipation or melena, no indigestion GU:no hematuria, no dysuria MS:no joint pain, no claudication Neuro:no syncope, no lightheadedness Endo:+ diabetes, no thyroid disease  Wt Readings from Last 3 Encounters:  06/29/14 142 lb 6.4 oz (64.592 kg)  06/20/14 143 lb (64.864 kg)  01/20/14 147 lb 14.4 oz (67.087 kg)    PHYSICAL EXAM BP 158/78  Pulse 58  Ht 5\' 5"  (1.651 m)  Wt 142 lb 6.4 oz (64.592 kg)  BMI 23.70 kg/m2 General:Pleasant affect, NAD Skin:Warm and dry, brisk capillary refill HEENT:normocephalic, sclera clear, mucus membranes  moist Neck:supple, no JVD, no bruits  Heart:S1S2 irreg irreg but rate controlled without murmur, gallup, rub or click Lungs:clear without rales, rhonchi, or wheezes SMO:LMBE, non tender, + BS, do not palpate liver spleen or masses Ext:no lower ext edema, 2+ pedal pulses, 2+ radial pulses Neuro:alert and oriented, MAE, follows commands, + facial symmetry  EKG: a flutter with variable AV block HR 34 old anteroseptal infarct  -- new flutter  ASSESSMENT AND PLAN Atrial flutter New a flutter with varying degrees of block, rate is controlled and she is asymptomatic.  I have started Eliquis 5 mg BID- 1 month of samples given.  Briefly discussed with Dr. Jerilynn Mages. Croitoru - will plan for DCCV in 3 weeks, though we will see in the office day before to see rhythm and evaluate.  She is agreeable to this plan.   Chronic anticoagulation, started 06/29/14 for a flutter Beginning eliquis 5 mg BID, follow up in 3 weeks.  OSA on CPAP stable  HTN (hypertension) Stable to slightly higher

## 2014-06-29 NOTE — Patient Instructions (Addendum)
1. Your physician recommends that you schedule a follow-up appointment in: the day before your procedure with either Dr. Sallyanne Kuster or APP.  2. Your physician has recommended that you have a Cardioversion (DCCV). Electrical Cardioversion uses a jolt of electricity to your heart either through paddles or wired patches attached to your chest. This is a controlled, usually prescheduled, procedure. Defibrillation is done under light anesthesia in the hospital, and you usually go home the day of the procedure. This is done to get your heart back into a normal rhythm. You are not awake for the procedure. Please see the instruction sheet given to you today.  3. Take Eliquis 5 mg two times a day as directed  4. We will schedule an Echo of your heart to look at the overall function of the heart muscle   5. Have your labs done today  6.Stop Aspirin

## 2014-06-30 ENCOUNTER — Encounter: Payer: Self-pay | Admitting: Cardiovascular Disease

## 2014-06-30 LAB — CBC WITH DIFFERENTIAL/PLATELET
BASOS PCT: 1 % (ref 0–1)
Basophils Absolute: 0.1 10*3/uL (ref 0.0–0.1)
EOS ABS: 0.1 10*3/uL (ref 0.0–0.7)
EOS PCT: 2 % (ref 0–5)
HCT: 35.1 % — ABNORMAL LOW (ref 36.0–46.0)
Hemoglobin: 11.4 g/dL — ABNORMAL LOW (ref 12.0–15.0)
LYMPHS ABS: 0.9 10*3/uL (ref 0.7–4.0)
Lymphocytes Relative: 14 % (ref 12–46)
MCH: 29.4 pg (ref 26.0–34.0)
MCHC: 32.5 g/dL (ref 30.0–36.0)
MCV: 90.5 fL (ref 78.0–100.0)
MONOS PCT: 9 % (ref 3–12)
Monocytes Absolute: 0.6 10*3/uL (ref 0.1–1.0)
Neutro Abs: 4.8 10*3/uL (ref 1.7–7.7)
Neutrophils Relative %: 74 % (ref 43–77)
PLATELETS: 215 10*3/uL (ref 150–400)
RBC: 3.88 MIL/uL (ref 3.87–5.11)
RDW: 15.2 % (ref 11.5–15.5)
WBC: 6.5 10*3/uL (ref 4.0–10.5)

## 2014-06-30 LAB — COMPLETE METABOLIC PANEL WITH GFR
ALK PHOS: 55 U/L (ref 39–117)
ALT: 11 U/L (ref 0–35)
AST: 20 U/L (ref 0–37)
Albumin: 4 g/dL (ref 3.5–5.2)
BILIRUBIN TOTAL: 0.6 mg/dL (ref 0.2–1.2)
BUN: 21 mg/dL (ref 6–23)
CO2: 26 meq/L (ref 19–32)
CREATININE: 1.13 mg/dL — AB (ref 0.50–1.10)
Calcium: 9.5 mg/dL (ref 8.4–10.5)
Chloride: 107 mEq/L (ref 96–112)
GFR, EST AFRICAN AMERICAN: 52 mL/min — AB
GFR, EST NON AFRICAN AMERICAN: 45 mL/min — AB
GLUCOSE: 116 mg/dL — AB (ref 70–99)
Potassium: 4.5 mEq/L (ref 3.5–5.3)
Sodium: 139 mEq/L (ref 135–145)
Total Protein: 7.1 g/dL (ref 6.0–8.3)

## 2014-06-30 LAB — TSH: TSH: 1.723 u[IU]/mL (ref 0.350–4.500)

## 2014-07-01 DIAGNOSIS — M47817 Spondylosis without myelopathy or radiculopathy, lumbosacral region: Secondary | ICD-10-CM | POA: Diagnosis not present

## 2014-07-01 DIAGNOSIS — M4712 Other spondylosis with myelopathy, cervical region: Secondary | ICD-10-CM | POA: Diagnosis not present

## 2014-07-01 DIAGNOSIS — IMO0001 Reserved for inherently not codable concepts without codable children: Secondary | ICD-10-CM | POA: Diagnosis not present

## 2014-07-03 DIAGNOSIS — Z7901 Long term (current) use of anticoagulants: Secondary | ICD-10-CM | POA: Insufficient documentation

## 2014-07-03 DIAGNOSIS — I4892 Unspecified atrial flutter: Secondary | ICD-10-CM | POA: Insufficient documentation

## 2014-07-03 NOTE — Assessment & Plan Note (Signed)
stable °

## 2014-07-03 NOTE — Assessment & Plan Note (Signed)
Beginning eliquis 5 mg BID, follow up in 3 weeks.

## 2014-07-03 NOTE — Assessment & Plan Note (Addendum)
New a flutter with varying degrees of block, rate is controlled and she is asymptomatic.  I have started Eliquis 5 mg BID- 1 month of samples given.  Briefly discussed with Dr. Jerilynn Mages. Croitoru - will plan for DCCV in 3 weeks, though we will see in the office day before to see rhythm and evaluate.  She is agreeable to this plan.  We will check 2 d Echo

## 2014-07-03 NOTE — Assessment & Plan Note (Signed)
Stable to slightly higher

## 2014-07-04 NOTE — Progress Notes (Signed)
Pt. Informed of bloodwork result

## 2014-07-07 ENCOUNTER — Ambulatory Visit (HOSPITAL_COMMUNITY)
Admission: RE | Admit: 2014-07-07 | Discharge: 2014-07-07 | Disposition: A | Discharge status: Home / Self Care | Payer: Medicare Other | Source: Ambulatory Visit | Attending: Cardiology | Admitting: Cardiology

## 2014-07-07 DIAGNOSIS — R079 Chest pain, unspecified: Secondary | ICD-10-CM | POA: Diagnosis not present

## 2014-07-07 DIAGNOSIS — I209 Angina pectoris, unspecified: Secondary | ICD-10-CM

## 2014-07-07 DIAGNOSIS — I369 Nonrheumatic tricuspid valve disorder, unspecified: Secondary | ICD-10-CM | POA: Diagnosis not present

## 2014-07-07 NOTE — Progress Notes (Signed)
2D Echo Performed 07/07/2014    Marygrace Drought, RCS

## 2014-07-08 ENCOUNTER — Encounter (HOSPITAL_COMMUNITY): Payer: Self-pay | Admitting: Pharmacy Technician

## 2014-07-08 DIAGNOSIS — M47817 Spondylosis without myelopathy or radiculopathy, lumbosacral region: Secondary | ICD-10-CM | POA: Diagnosis not present

## 2014-07-08 DIAGNOSIS — M4712 Other spondylosis with myelopathy, cervical region: Secondary | ICD-10-CM | POA: Diagnosis not present

## 2014-07-08 DIAGNOSIS — IMO0001 Reserved for inherently not codable concepts without codable children: Secondary | ICD-10-CM | POA: Diagnosis not present

## 2014-07-12 DIAGNOSIS — H20019 Primary iridocyclitis, unspecified eye: Secondary | ICD-10-CM | POA: Diagnosis not present

## 2014-07-12 DIAGNOSIS — H43399 Other vitreous opacities, unspecified eye: Secondary | ICD-10-CM | POA: Diagnosis not present

## 2014-07-12 DIAGNOSIS — H26499 Other secondary cataract, unspecified eye: Secondary | ICD-10-CM | POA: Diagnosis not present

## 2014-07-14 ENCOUNTER — Ambulatory Visit (INDEPENDENT_AMBULATORY_CARE_PROVIDER_SITE_OTHER): Payer: Medicare Other | Admitting: Podiatry

## 2014-07-14 ENCOUNTER — Encounter: Payer: Self-pay | Admitting: Podiatry

## 2014-07-14 DIAGNOSIS — M79609 Pain in unspecified limb: Secondary | ICD-10-CM

## 2014-07-14 DIAGNOSIS — M779 Enthesopathy, unspecified: Secondary | ICD-10-CM | POA: Diagnosis not present

## 2014-07-14 DIAGNOSIS — M79673 Pain in unspecified foot: Secondary | ICD-10-CM

## 2014-07-14 DIAGNOSIS — B351 Tinea unguium: Secondary | ICD-10-CM

## 2014-07-14 NOTE — Patient Instructions (Signed)
Diabetes and Foot Care Diabetes may cause you to have problems because of poor blood supply (circulation) to your feet and legs. This may cause the skin on your feet to become thinner, break easier, and heal more slowly. Your skin may become dry, and the skin may peel and crack. You may also have nerve damage in your legs and feet causing decreased feeling in them. You may not notice minor injuries to your feet that could lead to infections or more serious problems. Taking care of your feet is one of the most important things you can do for yourself.  HOME CARE INSTRUCTIONS  Wear shoes at all times, even in the house. Do not go barefoot. Bare feet are easily injured.  Check your feet daily for blisters, cuts, and redness. If you cannot see the bottom of your feet, use a mirror or ask someone for help.  Wash your feet with warm water (do not use hot water) and mild soap. Then pat your feet and the areas between your toes until they are completely dry. Do not soak your feet as this can dry your skin.  Apply a moisturizing lotion or petroleum jelly (that does not contain alcohol and is unscented) to the skin on your feet and to dry, brittle toenails. Do not apply lotion between your toes.  Trim your toenails straight across. Do not dig under them or around the cuticle. File the edges of your nails with an emery board or nail file.  Do not cut corns or calluses or try to remove them with medicine.  Wear clean socks or stockings every day. Make sure they are not too tight. Do not wear knee-high stockings since they may decrease blood flow to your legs.  Wear shoes that fit properly and have enough cushioning. To break in new shoes, wear them for just a few hours a day. This prevents you from injuring your feet. Always look in your shoes before you put them on to be sure there are no objects inside.  Do not cross your legs. This may decrease the blood flow to your feet.  If you find a minor scrape,  cut, or break in the skin on your feet, keep it and the skin around it clean and dry. These areas may be cleansed with mild soap and water. Do not cleanse the area with peroxide, alcohol, or iodine.  When you remove an adhesive bandage, be sure not to damage the skin around it.  If you have a wound, look at it several times a day to make sure it is healing.  Do not use heating pads or hot water bottles. They may burn your skin. If you have lost feeling in your feet or legs, you may not know it is happening until it is too late.  Make sure your health care provider performs a complete foot exam at least annually or more often if you have foot problems. Report any cuts, sores, or bruises to your health care provider immediately. SEEK MEDICAL CARE IF:   You have an injury that is not healing.  You have cuts or breaks in the skin.  You have an ingrown nail.  You notice redness on your legs or feet.  You feel burning or tingling in your legs or feet.  You have pain or cramps in your legs and feet.  Your legs or feet are numb.  Your feet always feel cold. SEEK IMMEDIATE MEDICAL CARE IF:   There is increasing redness,   swelling, or pain in or around a wound.  There is a red line that goes up your leg.  Pus is coming from a wound.  You develop a fever or as directed by your health care provider.  You notice a bad smell coming from an ulcer or wound. Document Released: 12/06/2000 Document Revised: 08/11/2013 Document Reviewed: 05/18/2013 Exit[employer] Patient Information 2015 [employer]. This information is not intended to replace advice given to you by your health care provider. Make sure you discuss any questions you have with your health care provider.  

## 2014-07-16 NOTE — Progress Notes (Signed)
Subjective:     Patient ID: Donna Owens, female   DOB: 1928/04/29, 80 y.o.   MRN: [id number]  HPI patient presents with thick toenails on both feet that she cannot cut and are painful   Review of Systems     Objective:   Physical Exam Neurovascular status intact with thick yellow brittle nails 1-5 both feet    Assessment:     Mycotic nail infection is with pain 1-5 both feet    Plan:     Debridement painful nailbeds 1-5 both feet with no iatrogenic bleeding noted

## 2014-07-20 ENCOUNTER — Encounter: Payer: Self-pay | Admitting: Cardiology

## 2014-07-20 ENCOUNTER — Ambulatory Visit (INDEPENDENT_AMBULATORY_CARE_PROVIDER_SITE_OTHER): Payer: Medicare Other | Admitting: Cardiology

## 2014-07-20 VITALS — HR 59 | Ht 65.0 in | Wt 140.2 lb

## 2014-07-20 DIAGNOSIS — I4892 Unspecified atrial flutter: Secondary | ICD-10-CM

## 2014-07-20 NOTE — Progress Notes (Signed)
Patient ID: Donna Owens, female   DOB: August 01, 1928, 80 y.o.   MRN: [id number]    07/20/2014 Donna Owens   1928/06/10  [id number]  Primary Physicia Donna Lopes, MD Primary Cardiologist: Dr Sallyanne Kuster  HPI:  The patient is a 80 year old African American female, followed by Dr. Sallyanne Kuster. She has numerous risk factors for atherosclerosis including hyperlipidemia, diabetes and hypertension. She also has known peripheral arterial atherosclerotic disease with stenosis at the left subclavian artery, at the origin of the celiac trunk as well as mild narrowing of the ostium of the right renal artery, all detected incidentally on CT angiography. 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 She has never complained of angina or intermittent claudication and also denied symptoms suggestive of mesenteric ischemia.  She was recently seen in clinic by Cecilie Kicks, NP, on 06/29/2014. At that time, she presented with a complaint of palpitations. An EKG demonstrated that she was in atrial flutter with a slow ventricular response of 58 beats per minute. Dr. Sallyanne Kuster was notified. It was recommended that she be started on oral anticoagulation. She was placed on Eliquis 5 mg twice a day, with the plan to pursue direct current cardioversion in 3 weeks, if still in atrial flutter.  She presents to clinic today for followup. She is accompanied by her 78 daughter. Repeat EKG demonstrates continuation of atrial flutter with variable AV block. Ventricular rate is 59 beats per minute. She notes mild palpitations but denies dizziness, lightheadedness, syncope/near-syncope, chest discomfort or shortness of breath. She states she has been fully compliant with her Eliquis and has not missed any doses. She also denies any abnormal bleeding and no falls.   Current Outpatient Prescriptions  Medication Sig Dispense Refill  . apixaban (ELIQUIS) 5 MG TABS tablet Take 5 mg by mouth 2 (two)  times daily.      Marland Kitchen aspirin 81 MG tablet Take 81 mg by mouth daily.      . calcium-vitamin D (OSCAL WITH D) 500-200 MG-UNIT per tablet Take 1 tablet by mouth daily with breakfast.       . carvedilol (COREG) 25 MG tablet Take 25 mg by mouth 2 (two) times daily with a meal.      . Cholecalciferol 1000 UNITS TBDP Take 1,000 Units by mouth daily.      . Coenzyme Q10 (CO Q 10) 100 MG CAPS Take 1 capsule by mouth daily.      . diazepam (VALIUM) 5 MG tablet Take 5 mg by mouth as needed.      . Ferrous Sulfate (IRON) 325 (65 FE) MG TABS Take 1 tablet by mouth every other day.       . gabapentin (NEURONTIN) 100 MG capsule Take 100 mg by mouth at bedtime.      Marland Kitchen glimepiride (AMARYL) 1 MG tablet Take 0.5 mg by mouth daily with breakfast.      . hydroxypropyl methylcellulose (ISOPTO TEARS) 2.5 % ophthalmic solution Place 1 drop into both eyes 3 (three) times daily as needed for dry eyes.      . hydrOXYzine (ATARAX/VISTARIL) 25 MG tablet Take 25 mg by mouth 3 (three) times daily as needed for itching.      Marland Kitchen lisinopril (PRINIVIL,ZESTRIL) 20 MG tablet Take 1 tablet by mouth daily.      . metFORMIN (GLUCOPHAGE-XR) 500 MG 24 hr tablet Take 500 mg by mouth 2 (two) times daily.      . pravastatin (PRAVACHOL) 40 MG tablet  Take 40 mg by mouth daily.      . prednisoLONE acetate (PRED FORTE) 1 % ophthalmic suspension Place 1 drop into the left eye 3 (three) times daily.       . promethazine (PHENERGAN) 25 MG tablet Take 25 mg by mouth 3 (three) times daily as needed. nausea      . vitamin B-12 (CYANOCOBALAMIN) 1000 MCG tablet Take 1,000 mcg by mouth daily.       No current facility-administered medications for this visit.    No Known Allergies  History   Social History  . Marital Status: Divorced    Spouse Name: N/A    Number of Children: 1  . Years of Education: BSN   Occupational History  . RETIRED    Social History Main Topics  . Smoking status: Never Smoker   . Smokeless tobacco: Never Used  .  Alcohol Use: No  . Drug Use: No  . Sexual Activity: No   Other Topics Concern  . Not on file   Social History Narrative   Patient is divorced and lives alone.   Patient has one adult daughter.   Patient is retired.   Patient has a BSN degree in nursing.   Patient is right-handed.   Patient drinks one soda per week.     Review of Systems: General: negative for chills, fever, night sweats or weight changes.  Cardiovascular: negative for chest pain, dyspnea on exertion, edema, orthopnea, palpitations, paroxysmal nocturnal dyspnea or shortness of breath Dermatological: negative for rash Respiratory: negative for cough or wheezing Urologic: negative for hematuria Abdominal: negative for nausea, vomiting, diarrhea, bright red blood per rectum, melena, or hematemesis Neurologic: negative for visual changes, syncope, or dizziness All other systems reviewed and are otherwise negative except as noted above.    Pulse 59, height 5\' 5"  (1.651 m), weight 140 lb 3.2 oz (63.594 kg).  General appearance: alert, cooperative and no distress Neck: no carotid bruit and no JVD Lungs: clear to auscultation bilaterally Heart: irregularly irregular rhythm Extremities: no LEE Pulses: 2+ and symmetric Skin: warm and dry Neurologic: Grossly normal  EKG ATRIAL flutter. Ventricular rate 59 beats per minute  ASSESSMENT AND PLAN:   1. Atrial flutter: EKG today demonstrates continuation of atrial flutter with a controlled ventricular response. She reports that she has been compliant with 3 weeks of uninterrupted anticoagulation with Eliquis. She is scheduled for direct current cardioversion with Dr. Sallyanne Kuster at [facility] tomorrow at 12 PM.   Plan: OP DCCV tomorrow in an attempt to restore normal sinus rhythm.  Brian Kocourek, BRITTAINYPA-C 07/20/2014 1:34 PM

## 2014-07-21 ENCOUNTER — Encounter (HOSPITAL_COMMUNITY): Payer: Medicare Other | Admitting: Anesthesiology

## 2014-07-21 ENCOUNTER — Ambulatory Visit (HOSPITAL_COMMUNITY): Payer: Medicare Other | Admitting: Anesthesiology

## 2014-07-21 ENCOUNTER — Encounter (HOSPITAL_COMMUNITY): Payer: Self-pay | Admitting: *Deleted

## 2014-07-21 ENCOUNTER — Encounter (HOSPITAL_COMMUNITY): Payer: Self-pay

## 2014-07-21 ENCOUNTER — Encounter (HOSPITAL_COMMUNITY)
Admission: RE | Disposition: A | Discharge status: Home / Self Care | Payer: Self-pay | Source: Ambulatory Visit | Attending: Cardiovascular Disease

## 2014-07-21 ENCOUNTER — Ambulatory Visit (HOSPITAL_COMMUNITY): Admit: 2014-07-21 | Payer: Self-pay | Admitting: Cardiovascular Disease

## 2014-07-21 ENCOUNTER — Ambulatory Visit (HOSPITAL_COMMUNITY)
Admission: RE | Admit: 2014-07-21 | Discharge: 2014-07-21 | Disposition: A | Discharge status: Home / Self Care | Payer: Medicare Other | Source: Ambulatory Visit | Attending: Cardiovascular Disease | Admitting: Cardiovascular Disease

## 2014-07-21 DIAGNOSIS — I771 Stricture of artery: Secondary | ICD-10-CM | POA: Diagnosis not present

## 2014-07-21 DIAGNOSIS — Z7901 Long term (current) use of anticoagulants: Secondary | ICD-10-CM | POA: Diagnosis not present

## 2014-07-21 DIAGNOSIS — I10 Essential (primary) hypertension: Secondary | ICD-10-CM | POA: Diagnosis not present

## 2014-07-21 DIAGNOSIS — E119 Type 2 diabetes mellitus without complications: Secondary | ICD-10-CM | POA: Diagnosis not present

## 2014-07-21 DIAGNOSIS — Z7982 Long term (current) use of aspirin: Secondary | ICD-10-CM | POA: Insufficient documentation

## 2014-07-21 DIAGNOSIS — E785 Hyperlipidemia, unspecified: Secondary | ICD-10-CM | POA: Insufficient documentation

## 2014-07-21 DIAGNOSIS — I4891 Unspecified atrial fibrillation: Secondary | ICD-10-CM | POA: Diagnosis not present

## 2014-07-21 DIAGNOSIS — I4892 Unspecified atrial flutter: Secondary | ICD-10-CM | POA: Diagnosis not present

## 2014-07-21 DIAGNOSIS — Z8673 Personal history of transient ischemic attack (TIA), and cerebral infarction without residual deficits: Secondary | ICD-10-CM | POA: Diagnosis not present

## 2014-07-21 DIAGNOSIS — G4730 Sleep apnea, unspecified: Secondary | ICD-10-CM | POA: Insufficient documentation

## 2014-07-21 DIAGNOSIS — I483 Typical atrial flutter: Secondary | ICD-10-CM

## 2014-07-21 HISTORY — PX: CARDIOVERSION: SHX1299

## 2014-07-21 LAB — GLUCOSE, CAPILLARY: GLUCOSE-CAPILLARY: 115 mg/dL — AB (ref 70–99)

## 2014-07-21 SURGERY — CARDIOVERSION
Anesthesia: Monitor Anesthesia Care

## 2014-07-21 MED ORDER — LISINOPRIL 20 MG PO TABS: 40.0000 mg | ORAL_TABLET | Freq: Every day | ORAL | Status: DC

## 2014-07-21 MED ORDER — SODIUM CHLORIDE 0.9 % IJ SOLN: 3.0000 mL | Freq: Two times a day (BID) | INTRAMUSCULAR | Status: DC

## 2014-07-21 MED ORDER — SODIUM CHLORIDE 0.9 % IJ SOLN: 3.0000 mL | INTRAMUSCULAR | Status: DC | PRN

## 2014-07-21 MED ORDER — PROPOFOL 10 MG/ML IV BOLUS
INTRAVENOUS | Status: DC | PRN
  Administered 2014-07-21: 40 mg via INTRAVENOUS

## 2014-07-21 MED ORDER — SODIUM CHLORIDE 0.9 % IV SOLN: 250.0000 mL | INTRAVENOUS | Status: DC

## 2014-07-21 MED ORDER — SODIUM CHLORIDE 0.9 % IV SOLN
INTRAVENOUS | Status: DC | PRN
  Administered 2014-07-21: 12:00:00 via INTRAVENOUS

## 2014-07-21 MED ORDER — LIDOCAINE HCL (CARDIAC) 20 MG/ML IV SOLN
INTRAVENOUS | Status: DC | PRN
  Administered 2014-07-21: 70 mg via INTRAVENOUS

## 2014-07-21 NOTE — Anesthesia Preprocedure Evaluation (Signed)
Anesthesia Evaluation  Patient identified by MRN, date of birth, ID band Patient awake    Reviewed: Allergy & Precautions, H&P , NPO status , Patient's Chart, lab work & pertinent test results  Airway Mallampati: II  Neck ROM: full    Dental   Pulmonary sleep apnea ,          Cardiovascular hypertension, + Peripheral Vascular Disease + dysrhythmias Atrial Fibrillation     Neuro/Psych CVA    GI/Hepatic   Endo/Other  diabetes, Type 2  Renal/GU      Musculoskeletal   Abdominal   Peds  Hematology   Anesthesia Other Findings   Reproductive/Obstetrics                           Anesthesia Physical Anesthesia Plan  ASA: III  Anesthesia Plan: MAC   Post-op Pain Management:    Induction: Intravenous  Airway Management Planned: Mask  Additional Equipment:   Intra-op Plan:   Post-operative Plan:   Informed Consent: I have reviewed the patients History and Physical, chart, labs and discussed the procedure including the risks, benefits and alternatives for the proposed anesthesia with the patient or authorized representative who has indicated his/her understanding and acceptance.     Plan Discussed with: CRNA, Anesthesiologist and Surgeon  Anesthesia Plan Comments:         Anesthesia Quick Evaluation

## 2014-07-21 NOTE — Progress Notes (Signed)
Late Entry: Md aware of elevated BP with some changes on bp meds.

## 2014-07-21 NOTE — Interval H&P Note (Signed)
History and Physical Interval Note:  07/21/2014 11:40 AM  Donna Owens  has presented today for surgery, with the diagnosis of AFIB  The various methods of treatment have been discussed with the patient and family. After consideration of risks, benefits and other options for treatment, the patient has consented to  Procedure(s): CARDIOVERSION (N/A) as a surgical intervention .  The patient's history has been reviewed, patient examined, no change in status, stable for surgery.  I have reviewed the patient's chart and labs.  Questions were answered to the patient's satisfaction.     Marnette Perkins

## 2014-07-21 NOTE — Transfer of Care (Signed)
Immediate Anesthesia Transfer of Care Note  Patient: Donna Owens  Procedure(s) Performed: Procedure(s): CARDIOVERSION (N/A)  Patient Location: Endoscopy Unit  Anesthesia Type:General  Level of Consciousness: awake and alert   Airway & Oxygen Therapy: Patient Spontanous Breathing and Patient connected to nasal cannula oxygen  Post-op Assessment: Report given to PACU RN, Post -op Vital signs reviewed and stable and Patient moving all extremities  Post vital signs: Reviewed and stable  Complications: No apparent anesthesia complications

## 2014-07-21 NOTE — Anesthesia Postprocedure Evaluation (Signed)
  Anesthesia Post-op Note  Patient: Donna Owens  Procedure(s) Performed: Procedure(s): CARDIOVERSION (N/A)  Patient Location: Endoscopy Unit  Anesthesia Type:General  Level of Consciousness: awake, alert  and oriented  Airway and Oxygen Therapy: Patient Spontanous Breathing and Patient connected to nasal cannula oxygen  Post-op Pain: none  Post-op Assessment: Post-op Vital signs reviewed  Post-op Vital Signs: Reviewed and stable  Last Vitals:  Filed Vitals:   07/21/14 1245  BP: 199/69  Pulse: 58  Temp:   Resp: 18    Complications: No apparent anesthesia complications

## 2014-07-21 NOTE — Op Note (Signed)
Procedure: Electrical Cardioversion Indications:  Atrial Flutter  Procedure Details:  Consent: Risks of procedure as well as the alternatives and risks of each were explained to the (patient/caregiver).  Consent for procedure obtained.  Time Out: Verified patient identification, verified procedure, site/side was marked, verified correct patient position, special equipment/implants available, medications/allergies/relevent history reviewed, required imaging and test results available.  Performed  Patient placed on cardiac monitor, pulse oximetry, supplemental oxygen as necessary.  Sedation given: IV propofol, Anesthesiology Pacer pads placed anterior and posterior chest.  Cardioverted 1 time(s).  Cardioverted at 120J. Sync biphasic  Evaluation: Findings: Post procedure EKG shows: NSR Complications: None Patient did tolerate procedure well.  Time Spent Directly with the Patient:  30 minutes   Arneta Mahmood 07/21/2014, 12:07 PM

## 2014-07-21 NOTE — H&P (View-Only) (Signed)
Patient ID: Donna Owens, female   DOB: 1928/07/14, 80 y.o.   MRN: [id number]    07/20/2014 Donna Owens   Aug 31, 1928  [id number]  Primary Physicia [name], MD Primary Cardiologist: Dr Sallyanne Kuster  HPI:  The patient is a 80 year old African American female, followed by Dr. Sallyanne Kuster. She has numerous risk factors for atherosclerosis including hyperlipidemia, diabetes and hypertension. She also has known peripheral arterial atherosclerotic disease with stenosis at the left subclavian artery, at the origin of the celiac trunk as well as mild narrowing of the ostium of the right renal artery, all detected incidentally on CT angiography. 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 She has never complained of angina or intermittent claudication and also denied symptoms suggestive of mesenteric ischemia.  She was recently seen in clinic by Cecilie Kicks, NP, on 07/78/2015. At that time, she presented with a complaint of palpitations. An EKG demonstrated that she was in atrial flutter with a slow ventricular response of 58 beats per minute. Dr. Sallyanne Kuster was notified. It was recommended that she be started on oral anticoagulation. She was placed on Eliquis 5 mg twice a day, with the plan to pursue direct current cardioversion in 3 weeks, if still in atrial flutter.  She presents to clinic today for followup. She is accompanied by her daughter. Repeat EKG demonstrates continuation of atrial flutter with variable AV block. Ventricular rate is 59 beats per minute. She notes mild palpitations but denies dizziness, lightheadedness, syncope/near-syncope, chest discomfort or shortness of breath. She states she has been fully compliant with her Eliquis and has not missed any doses. She also denies any abnormal bleeding and no falls.   Current Outpatient Prescriptions  Medication Sig Dispense Refill  . apixaban (ELIQUIS) 5 MG TABS tablet Take 5 mg by mouth 2 (two)  times daily.      Marland Kitchen aspirin 81 MG tablet Take 81 mg by mouth daily.      . calcium-vitamin D (OSCAL WITH D) 500-200 MG-UNIT per tablet Take 1 tablet by mouth daily with breakfast.       . carvedilol (COREG) 25 MG tablet Take 25 mg by mouth 2 (two) times daily with a meal.      . Cholecalciferol 1000 UNITS TBDP Take 1,000 Units by mouth daily.      . Coenzyme Q10 (CO Q 10) 100 MG CAPS Take 1 capsule by mouth daily.      . diazepam (VALIUM) 5 MG tablet Take 5 mg by mouth as needed.      . Ferrous Sulfate (IRON) 325 (65 FE) MG TABS Take 1 tablet by mouth every other day.       . gabapentin (NEURONTIN) 100 MG capsule Take 100 mg by mouth at bedtime.      Marland Kitchen glimepiride (AMARYL) 1 MG tablet Take 0.5 mg by mouth daily with breakfast.      . hydroxypropyl methylcellulose (ISOPTO TEARS) 2.5 % ophthalmic solution Place 1 drop into both eyes 3 (three) times daily as needed for dry eyes.      . hydrOXYzine (ATARAX/VISTARIL) 25 MG tablet Take 25 mg by mouth 3 (three) times daily as needed for itching.      Marland Kitchen lisinopril (PRINIVIL,ZESTRIL) 20 MG tablet Take 1 tablet by mouth daily.      . metFORMIN (GLUCOPHAGE-XR) 500 MG 24 hr tablet Take 500 mg by mouth 2 (two) times daily.      . pravastatin (PRAVACHOL) 40 MG tablet  Take 40 mg by mouth daily.      . prednisoLONE acetate (PRED FORTE) 1 % ophthalmic suspension Place 1 drop into the left eye 3 (three) times daily.       . promethazine (PHENERGAN) 25 MG tablet Take 25 mg by mouth 3 (three) times daily as needed. nausea      . vitamin B-12 (CYANOCOBALAMIN) 1000 MCG tablet Take 1,000 mcg by mouth daily.       No current facility-administered medications for this visit.    No Known Allergies  History   Social History  . Marital Status: Divorced    Spouse Name: N/A    Number of Children: 1  . Years of Education: BSN   Occupational History  . RETIRED    Social History Main Topics  . Smoking status: Never Smoker   . Smokeless tobacco: Never Used  .  Alcohol Use: No  . Drug Use: No  . Sexual Activity: No   Other Topics Concern  . Not on file   Social History Narrative   Patient is divorced and lives alone.   Patient has one adult daughter.   Patient is retired.   Patient has a BSN degree in nursing.   Patient is right-handed.   Patient drinks one soda per week.     Review of Systems: General: negative for chills, fever, night sweats or weight changes.  Cardiovascular: negative for chest pain, dyspnea on exertion, edema, orthopnea, palpitations, paroxysmal nocturnal dyspnea or shortness of breath Dermatological: negative for rash Respiratory: negative for cough or wheezing Urologic: negative for hematuria Abdominal: negative for nausea, vomiting, diarrhea, bright red blood per rectum, melena, or hematemesis Neurologic: negative for visual changes, syncope, or dizziness All other systems reviewed and are otherwise negative except as noted above.    Pulse 59, height 5\' 5"  (1.651 m), weight 140 lb 3.2 oz (63.594 kg).  General appearance: alert, cooperative and no distress Neck: no carotid bruit and no JVD Lungs: clear to auscultation bilaterally Heart: irregularly irregular rhythm Extremities: no LEE Pulses: 2+ and symmetric Skin: warm and dry Neurologic: Grossly normal  EKG ATRIAL flutter. Ventricular rate 59 beats per minute  ASSESSMENT AND PLAN:   1. Atrial flutter: EKG today demonstrates continuation of atrial flutter with a controlled ventricular response. She reports that she has been compliant with 3 weeks of uninterrupted anticoagulation with Eliquis. She is scheduled for direct current cardioversion with Dr. Sallyanne Kuster at [facility] tomorrow at 12 PM.   Plan: OP DCCV tomorrow in an attempt to restore normal sinus rhythm.  Donna Owens, BRITTAINYPA-C 07/20/2014 1:34 PM

## 2014-07-25 ENCOUNTER — Encounter (HOSPITAL_COMMUNITY): Payer: Self-pay | Admitting: Cardiovascular Disease

## 2014-07-25 ENCOUNTER — Telehealth: Payer: Self-pay | Admitting: Cardiovascular Disease

## 2014-07-25 MED ORDER — APIXABAN 5 MG PO TABS: 5.0000 mg | ORAL_TABLET | Freq: Two times a day (BID) | ORAL | Status: DC

## 2014-07-25 NOTE — Telephone Encounter (Signed)
New message     Pt had a cardiac conversion last Thursday----she has questions to ask the nurse.

## 2014-07-25 NOTE — Telephone Encounter (Signed)
Calling to verify her meds.  Needs samples of Eliquis - 2 week supply given will pick up tomorrow.

## 2014-07-27 ENCOUNTER — Telehealth: Payer: Self-pay | Admitting: Cardiovascular Disease

## 2014-07-27 NOTE — Telephone Encounter (Signed)
Returned call to patient she wanted to ask Dr.Croitoru if ok to go back to physical therapy for her back.Also if ok to volunteer at [facility] once a week.Dr.Croitoru out of office will send message to him for advice.

## 2014-07-27 NOTE — Telephone Encounter (Signed)
Returned call to patient Dr.Croitoru advised ok to go back to physical therapy and ok to volunteer.

## 2014-07-27 NOTE — Telephone Encounter (Signed)
Yes, to both PT and Volunteering.

## 2014-07-27 NOTE — Telephone Encounter (Signed)
Is wanting to know if she can go back to Volunteering at the [facility] once a Week . Please Call  Thanks

## 2014-07-29 ENCOUNTER — Other Ambulatory Visit: Payer: Self-pay

## 2014-07-29 MED ORDER — APIXABAN 5 MG PO TABS: 5.0000 mg | ORAL_TABLET | Freq: Two times a day (BID) | ORAL | Status: DC

## 2014-08-04 ENCOUNTER — Encounter: Payer: Self-pay | Admitting: Neurology

## 2014-08-04 ENCOUNTER — Ambulatory Visit (INDEPENDENT_AMBULATORY_CARE_PROVIDER_SITE_OTHER): Payer: Medicare Other | Admitting: Neurology

## 2014-08-04 DIAGNOSIS — G4731 Primary central sleep apnea: Secondary | ICD-10-CM

## 2014-08-04 DIAGNOSIS — R0609 Other forms of dyspnea: Secondary | ICD-10-CM | POA: Diagnosis not present

## 2014-08-04 DIAGNOSIS — R351 Nocturia: Secondary | ICD-10-CM | POA: Diagnosis not present

## 2014-08-04 DIAGNOSIS — R0989 Other specified symptoms and signs involving the circulatory and respiratory systems: Secondary | ICD-10-CM

## 2014-08-04 DIAGNOSIS — E0842 Diabetes mellitus due to underlying condition with diabetic polyneuropathy: Secondary | ICD-10-CM

## 2014-08-04 DIAGNOSIS — Z9989 Dependence on other enabling machines and devices: Principal | ICD-10-CM

## 2014-08-04 DIAGNOSIS — G4733 Obstructive sleep apnea (adult) (pediatric): Secondary | ICD-10-CM

## 2014-08-05 ENCOUNTER — Encounter: Payer: Self-pay | Admitting: *Deleted

## 2014-08-05 ENCOUNTER — Ambulatory Visit (INDEPENDENT_AMBULATORY_CARE_PROVIDER_SITE_OTHER): Payer: Medicare Other | Admitting: *Deleted

## 2014-08-05 VITALS — BP 144/66 | HR 67 | Ht 64.0 in | Wt 134.8 lb

## 2014-08-05 DIAGNOSIS — I4891 Unspecified atrial fibrillation: Secondary | ICD-10-CM

## 2014-08-05 NOTE — Progress Notes (Signed)
Patient presented for EKG 2 weeks post DCCV.  EKG reviewed By Dr. Stanford Breed.

## 2014-08-09 DIAGNOSIS — IMO0001 Reserved for inherently not codable concepts without codable children: Secondary | ICD-10-CM | POA: Diagnosis not present

## 2014-08-09 DIAGNOSIS — M4712 Other spondylosis with myelopathy, cervical region: Secondary | ICD-10-CM | POA: Diagnosis not present

## 2014-08-09 DIAGNOSIS — M47817 Spondylosis without myelopathy or radiculopathy, lumbosacral region: Secondary | ICD-10-CM | POA: Diagnosis not present

## 2014-08-11 DIAGNOSIS — M4712 Other spondylosis with myelopathy, cervical region: Secondary | ICD-10-CM | POA: Diagnosis not present

## 2014-08-11 DIAGNOSIS — M47817 Spondylosis without myelopathy or radiculopathy, lumbosacral region: Secondary | ICD-10-CM | POA: Diagnosis not present

## 2014-08-11 DIAGNOSIS — IMO0001 Reserved for inherently not codable concepts without codable children: Secondary | ICD-10-CM | POA: Diagnosis not present

## 2014-08-16 DIAGNOSIS — M47817 Spondylosis without myelopathy or radiculopathy, lumbosacral region: Secondary | ICD-10-CM | POA: Diagnosis not present

## 2014-08-16 DIAGNOSIS — IMO0001 Reserved for inherently not codable concepts without codable children: Secondary | ICD-10-CM | POA: Diagnosis not present

## 2014-08-16 DIAGNOSIS — M4712 Other spondylosis with myelopathy, cervical region: Secondary | ICD-10-CM | POA: Diagnosis not present

## 2014-08-18 DIAGNOSIS — IMO0001 Reserved for inherently not codable concepts without codable children: Secondary | ICD-10-CM | POA: Diagnosis not present

## 2014-08-18 DIAGNOSIS — M47817 Spondylosis without myelopathy or radiculopathy, lumbosacral region: Secondary | ICD-10-CM | POA: Diagnosis not present

## 2014-08-18 DIAGNOSIS — M4712 Other spondylosis with myelopathy, cervical region: Secondary | ICD-10-CM | POA: Diagnosis not present

## 2014-08-22 ENCOUNTER — Telehealth: Payer: Self-pay | Admitting: Neurology

## 2014-08-22 NOTE — Telephone Encounter (Signed)
I called the patient and left a message for her to return a call to the office to review the results of her sleep study.

## 2014-08-23 DIAGNOSIS — M47817 Spondylosis without myelopathy or radiculopathy, lumbosacral region: Secondary | ICD-10-CM | POA: Diagnosis not present

## 2014-08-23 DIAGNOSIS — M4712 Other spondylosis with myelopathy, cervical region: Secondary | ICD-10-CM | POA: Diagnosis not present

## 2014-08-23 DIAGNOSIS — IMO0001 Reserved for inherently not codable concepts without codable children: Secondary | ICD-10-CM | POA: Diagnosis not present

## 2014-08-24 ENCOUNTER — Telehealth: Payer: Self-pay | Admitting: Cardiovascular Disease

## 2014-08-24 MED ORDER — APIXABAN 5 MG PO TABS: 5.0000 mg | ORAL_TABLET | Freq: Two times a day (BID) | ORAL | Status: DC

## 2014-08-24 NOTE — Telephone Encounter (Signed)
Rx was sent to pharmacy electronically. 

## 2014-08-24 NOTE — Telephone Encounter (Signed)
Need a new prescription for Eliquis 5 mg #90. Please call to [phone].

## 2014-08-30 ENCOUNTER — Telehealth: Payer: Self-pay | Admitting: Cardiovascular Disease

## 2014-08-30 DIAGNOSIS — M47817 Spondylosis without myelopathy or radiculopathy, lumbosacral region: Secondary | ICD-10-CM | POA: Diagnosis not present

## 2014-08-30 DIAGNOSIS — IMO0001 Reserved for inherently not codable concepts without codable children: Secondary | ICD-10-CM | POA: Diagnosis not present

## 2014-08-30 DIAGNOSIS — M4712 Other spondylosis with myelopathy, cervical region: Secondary | ICD-10-CM | POA: Diagnosis not present

## 2014-08-30 MED ORDER — APIXABAN 5 MG PO TABS: 5.0000 mg | ORAL_TABLET | Freq: Two times a day (BID) | ORAL | Status: DC

## 2014-08-30 NOTE — Telephone Encounter (Signed)
Samples of eliquis provided. Patient would like to see if she qualifies for any type of assistance program and/or figure out what other options are available.   Message forwarded to [place], PharmD to advise

## 2014-08-30 NOTE — Telephone Encounter (Signed)
Pt called in stating that she needs financial assistance with the Eliquis because she can not afford it.  She also stated that she is almost out of the samples she was given. Please call  Thanks

## 2014-08-31 ENCOUNTER — Other Ambulatory Visit: Payer: Self-pay | Admitting: Pharmacist Clinician (PhC)/ Clinical Pharmacy Specialist

## 2014-08-31 ENCOUNTER — Telehealth: Payer: Self-pay | Admitting: Cardiovascular Disease

## 2014-08-31 MED ORDER — APIXABAN 5 MG PO TABS: 5.0000 mg | ORAL_TABLET | Freq: Two times a day (BID) | ORAL | Status: DC

## 2014-08-31 NOTE — Telephone Encounter (Signed)
LMOM for patient to call for information regarding patient assistance for Eliquis

## 2014-08-31 NOTE — Telephone Encounter (Signed)
Returning your call. °

## 2014-08-31 NOTE — Telephone Encounter (Signed)
Forwarded to [employer]

## 2014-09-01 ENCOUNTER — Telehealth: Payer: Self-pay | Admitting: *Deleted

## 2014-09-01 DIAGNOSIS — IMO0001 Reserved for inherently not codable concepts without codable children: Secondary | ICD-10-CM | POA: Diagnosis not present

## 2014-09-01 DIAGNOSIS — M4712 Other spondylosis with myelopathy, cervical region: Secondary | ICD-10-CM | POA: Diagnosis not present

## 2014-09-01 DIAGNOSIS — M47817 Spondylosis without myelopathy or radiculopathy, lumbosacral region: Secondary | ICD-10-CM | POA: Diagnosis not present

## 2014-09-01 NOTE — Telephone Encounter (Signed)
Prior Auth sent to [employer] for [employer].

## 2014-09-01 NOTE — Telephone Encounter (Signed)
Prior Auth from [employer] through 09/11/15

## 2014-09-01 NOTE — Telephone Encounter (Signed)
LMOM, patient wants information on PA for eliquis

## 2014-09-01 NOTE — Telephone Encounter (Signed)
Spoke with patient, she was needing prior authorization, not patient assistance for Eliquis.   [employer], her prior Josem Kaufmann was taken care of, her copay is $35/month.  Called patient back and let her know.

## 2014-09-19 ENCOUNTER — Other Ambulatory Visit: Payer: Self-pay | Admitting: *Deleted

## 2014-09-19 ENCOUNTER — Telehealth: Payer: Self-pay | Admitting: Cardiovascular Disease

## 2014-09-19 MED ORDER — LISINOPRIL 20 MG PO TABS: 40.0000 mg | ORAL_TABLET | Freq: Every day | ORAL | Status: DC

## 2014-09-19 NOTE — Telephone Encounter (Signed)
Left message for patient to call, ? She is taking lisinopril tid?

## 2014-09-19 NOTE — Telephone Encounter (Signed)
Pt called in stating that Dr.C changed her dosage for Lisinopril. He now wants her to take 40mg  tid. She is not out of this medication and would like it refilled. Please call  Thanks

## 2014-09-19 NOTE — Telephone Encounter (Signed)
Sorry Hilda Blades its suppose to be bid

## 2014-10-05 ENCOUNTER — Ambulatory Visit: Payer: Self-pay | Admitting: Cardiovascular Disease

## 2014-10-05 DIAGNOSIS — I10 Essential (primary) hypertension: Secondary | ICD-10-CM | POA: Diagnosis not present

## 2014-10-05 DIAGNOSIS — R269 Unspecified abnormalities of gait and mobility: Secondary | ICD-10-CM | POA: Diagnosis not present

## 2014-10-05 DIAGNOSIS — H6123 Impacted cerumen, bilateral: Secondary | ICD-10-CM | POA: Diagnosis not present

## 2014-10-05 DIAGNOSIS — M1990 Unspecified osteoarthritis, unspecified site: Secondary | ICD-10-CM | POA: Diagnosis not present

## 2014-10-05 DIAGNOSIS — E1151 Type 2 diabetes mellitus with diabetic peripheral angiopathy without gangrene: Secondary | ICD-10-CM | POA: Diagnosis not present

## 2014-10-05 DIAGNOSIS — I4892 Unspecified atrial flutter: Secondary | ICD-10-CM | POA: Diagnosis not present

## 2014-10-05 DIAGNOSIS — I739 Peripheral vascular disease, unspecified: Secondary | ICD-10-CM | POA: Diagnosis not present

## 2014-10-05 DIAGNOSIS — N183 Chronic kidney disease, stage 3 (moderate): Secondary | ICD-10-CM | POA: Diagnosis not present

## 2014-10-05 DIAGNOSIS — Z1389 Encounter for screening for other disorder: Secondary | ICD-10-CM | POA: Diagnosis not present

## 2014-10-13 ENCOUNTER — Ambulatory Visit (INDEPENDENT_AMBULATORY_CARE_PROVIDER_SITE_OTHER): Payer: Medicare Other | Admitting: Podiatry

## 2014-10-13 DIAGNOSIS — B351 Tinea unguium: Secondary | ICD-10-CM

## 2014-10-13 DIAGNOSIS — M79673 Pain in unspecified foot: Secondary | ICD-10-CM

## 2014-10-13 NOTE — Progress Notes (Signed)
   Subjective:    Patient ID: Donna Owens, female    DOB: [birth year], 80 y.o.   MRN: [id number]  HPI  Pt presents for nail debridement Review of Systems     Objective:   Physical Exam pulses are palpable bilateral. Nails are thick yellow dystrophic onychomycotic and painful palpation.        Assessment & Plan:  Assessment: Pain in limb secondary to onychomycosis 1 through 5 bilateral.  Plan: Debridement of nails 1 through 5 bilateral covered service secondary to pain. Followup with her in 3 months.

## 2014-10-20 ENCOUNTER — Ambulatory Visit (INDEPENDENT_AMBULATORY_CARE_PROVIDER_SITE_OTHER): Payer: Medicare Other | Admitting: Cardiovascular Disease

## 2014-10-20 ENCOUNTER — Encounter: Payer: Self-pay | Admitting: Cardiovascular Disease

## 2014-10-20 VITALS — BP 162/70 | HR 60 | Ht 64.0 in | Wt 136.7 lb

## 2014-10-20 DIAGNOSIS — E785 Hyperlipidemia, unspecified: Secondary | ICD-10-CM

## 2014-10-20 DIAGNOSIS — K559 Vascular disorder of intestine, unspecified: Secondary | ICD-10-CM | POA: Diagnosis not present

## 2014-10-20 DIAGNOSIS — I10 Essential (primary) hypertension: Secondary | ICD-10-CM | POA: Diagnosis not present

## 2014-10-20 DIAGNOSIS — I739 Peripheral vascular disease, unspecified: Secondary | ICD-10-CM | POA: Diagnosis not present

## 2014-10-20 DIAGNOSIS — I483 Typical atrial flutter: Secondary | ICD-10-CM

## 2014-10-20 NOTE — Patient Instructions (Signed)
Your physician has requested that you have an abdominal aorta duplex. During this test, an ultrasound is used to evaluate the aorta. Allow 30 minutes for this exam. Do not eat after midnight the day before and avoid carbonated beverages  Check your BP daily and keep a record over the next 2 weeks.  If the top number stays above 140 please contact our office.  Dr. Sallyanne Kuster recommends that you schedule a follow-up appointment in: One year.

## 2014-10-20 NOTE — Progress Notes (Signed)
Patient ID: Donna Owens, female   DOB: 1928-05-05, 80 y.o.   MRN: [id number]     Reason for office visit HTN, PAD, atrial flutter  Donna Owens is an 80 year old woman with numerous risk factors for atherosclerosis including hyperlipidemia diabetes mellitus and hypertension.   She is known to have peripheral arterial atherosclerotic disease with stenosis of the left subclavian artery, origin of the celiac trunk and mild narrowing of the ostium of the right renal artery, all detected incidentally on CT angiography. 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 She has never complained of angina or intermittent claudication and does not have symptoms suggestive of mesenteric ischemia either.   In July 2015 she presented with palpitations related to atrial flutter with slow ventricular response. Cardioversion was successfully performed on July 30. She is on chronic anticoagulation with Eliquis. Does not have a history of stroke or transient ischemic attack or other embolic events and has not had bleeding complications.   She has preserved left ventricular systolic function and moderate LVH by echo. She had moderate tricuspid regurgitation, moderate pulmonary hypertension and a moderately dilated right atrium with preserved right ventricular function. Right heart enlargement and pulmonary hypertension are possibly related to obstructive sleep apnea which has been intermittently treated with CPAP since 2011.  She is steadily losing weight and is not sure why. Her appetite is poor. Thyroid function was normal in July. She takes metformin for diabetes mellitus and reports a hemoglobin A1c less than 6 (will get her results from Dr. Sharlett Iles). Denies intestinal angina    No Known Allergies  Current Outpatient Prescriptions  Medication Sig Dispense Refill  . apixaban (ELIQUIS) 5 MG TABS tablet Take 1 tablet (5 mg total) by mouth 2 (two) times daily.  60 tablet  3    . calcium-vitamin D (OSCAL WITH D) 500-200 MG-UNIT per tablet Take 1 tablet by mouth daily with breakfast.       . carvedilol (COREG) 25 MG tablet Take 25 mg by mouth 2 (two) times daily with a meal.      . Cholecalciferol 1000 UNITS TBDP Take 1,000 Units by mouth daily.      . Coenzyme Q10 (CO Q 10) 100 MG CAPS Take 1 capsule by mouth daily.      . diazepam (VALIUM) 5 MG tablet Take 5 mg by mouth as needed.      . Ferrous Sulfate (IRON) 325 (65 FE) MG TABS Take 1 tablet by mouth every other day.       . gabapentin (NEURONTIN) 100 MG capsule Take 100 mg by mouth at bedtime.      Marland Kitchen glimepiride (AMARYL) 1 MG tablet Take 0.5 mg by mouth daily with breakfast.      . hydrOXYzine (ATARAX/VISTARIL) 25 MG tablet Take 25 mg by mouth 3 (three) times daily as needed for itching.      Marland Kitchen lisinopril (PRINIVIL,ZESTRIL) 20 MG tablet Take 2 tablets (40 mg total) by mouth daily.  180 tablet  3  . metFORMIN (GLUCOPHAGE-XR) 500 MG 24 hr tablet Take 500 mg by mouth 2 (two) times daily.      Marland Kitchen NIFEDICAL XL 30 MG 24 hr tablet       . pravastatin (PRAVACHOL) 40 MG tablet Take 40 mg by mouth daily.      . promethazine (PHENERGAN) 25 MG tablet Take 25 mg by mouth 3 (three) times daily as needed. nausea      . vitamin B-12 (  CYANOCOBALAMIN) 1000 MCG tablet Take 1,000 mcg by mouth daily.       No current facility-administered medications for this visit.    Past Medical History  Diagnosis Date  . Diabetes mellitus   . Hypertension   . Cancer   . Hyperlipemia   . Pulmonary hypertension     moderate echo 11/07/10  . Carotid arterial disease 12/13/11     mild by doppler  . PVD (peripheral vascular disease)   . OSA (obstructive sleep apnea)   . Anemia   . Atrophic vaginitis   . Sleep apnea   . Osteoporosis   . CVA (cerebral vascular accident)   . Blood transfusion without reported diagnosis   . OSA on CPAP 06/20/2014    Past Surgical History  Procedure Laterality Date  . Breast surgery      right 07/26/2000   . Breast lumpectomy Right     chemo/radiation  . Breast lumpectomy Left     benign  . Colonoscopy  2010  . Ingrown toenails      removed in 03/2013  . Cardioversion N/A 07/21/2014    Procedure: CARDIOVERSION;  Surgeon: Sanda Klein, MD;  Location: [facility];  Service: Cardiovascular;  Laterality: N/A;    Family History  Problem Relation Age of Onset  . Diabetes Other   . Diabetes Brother   . Heart failure Daughter 109    History   Social History  . Marital Status: Divorced    Spouse Name: N/A    Number of Children: 1  . Years of Education: BSN   Occupational History  . RETIRED    Social History Main Topics  . Smoking status: Never Smoker   . Smokeless tobacco: Never Used  . Alcohol Use: No  . Drug Use: No  . Sexual Activity: No   Other Topics Concern  . Not on file   Social History Narrative   Patient is divorced and lives alone.   Patient has one adult daughter.   Patient is retired.   Patient has a BSN degree in nursing.   Patient is right-handed.   Patient drinks one soda per week.    Review of systems: Unexplained gradual weight loss. Minimal ankle swelling may be calcium channel blocker related The patient specifically denies any chest pain at rest or with exertion, dyspnea at rest or with exertion, orthopnea, paroxysmal nocturnal dyspnea, syncope, palpitations, focal neurological deficits, intermittent claudication, lower extremity edema, unexplained weight gain, cough, hemoptysis or wheezing.  The patient also denies abdominal pain, nausea, vomiting, dysphagia, diarrhea, constipation, polyuria, polydipsia, dysuria, hematuria, frequency, urgency, abnormal bleeding or bruising, fever, chills, unexpected weight changes, mood swings, change in skin or hair texture, change in voice quality, auditory or visual problems, allergic reactions or rashes, new musculoskeletal complaints other than usual "aches and pains".   PHYSICAL EXAM BP 162/70  Pulse 60  Ht  _0  (1.626 m)  Wt 136 lb 11.2 oz (62.007 kg)  BMI 23.45 kg/m2  General: Alert, oriented x3, no distress Head: no evidence of trauma, PERRL, EOMI, no exophtalmos or lid lag, no myxedema, no xanthelasma; normal ears, nose and oropharynx Neck: normal jugular venous pulsations and no hepatojugular reflux; brisk carotid pulses without delay and no carotid bruits Chest: clear to auscultation, no signs of consolidation by percussion or palpation, normal fremitus, symmetrical and full respiratory excursions Cardiovascular: normal position and quality of the apical impulse, regular rhythm, normal first and second heart sounds, no murmurs, rubs or gallops Abdomen: no tenderness  or distention, no masses by palpation, no abnormal pulsatility or arterial bruits, normal bowel sounds, no hepatosplenomegaly Extremities: no clubbing, cyanosis or edema; 2+ radial, ulnar and brachial pulses bilaterally; 2+ right femoral, posterior tibial and dorsalis pedis pulses; 2+ left femoral, posterior tibial and dorsalis pedis pulses; no subclavian or femoral bruits Neurological: grossly nonfocal  Lipid Panel     Component Value Date/Time   CHOL 139 02/02/2014 1106   TRIG 47 02/02/2014 1106   HDL 54 02/02/2014 1106   CHOLHDL 2.6 02/02/2014 1106   VLDL 9 02/02/2014 1106   LDLCALC 76 02/02/2014 1106    BMET    Component Value Date/Time   NA 139 06/29/2014 1607   K 4.5 06/29/2014 1607   CL 107 06/29/2014 1607   CO2 26 06/29/2014 1607   GLUCOSE 116* 06/29/2014 1607   BUN 21 06/29/2014 1607   CREATININE 1.13* 06/29/2014 1607   CREATININE 1.13 04/21/2011 1606   CALCIUM 9.5 06/29/2014 1607   GFRNONAA 45* 06/29/2014 1607   GFRNONAA 46* 04/21/2011 1606   GFRAA 52* 06/29/2014 1607   GFRAA  Value: 56        The eGFR has been calculated using the MDRD equation. This calculation has not been validated in all clinical situations. eGFR's persistently <60 mL/min signify possible Chronic Kidney Disease.* 04/21/2011 1606     ASSESSMENT AND  PLAN PAD (peripheral artery disease) She does not have intestinal angina but I wonder whether her celiac artery stenosis could be playing a role in her unexplained weight loss. Will perform an abdominal aortic ultrasound to evaluate the celiac and superior mesenteric arteries, also reevaluate her known renal artery stenosis.  Atrial flutter In normal rhythm today and no symptoms of recurrent palpitations since her cardioversion. Continue anticoagulation indefinitely. She has only had one clinically evident episodes so far. Antiarrhythmics are not justified, nor is ablation at this point. She has moderate pulmonary hypertension and right heart enlargement which probably explain the occurrence of atrial flutter.  It is likely that she has residual cor pulmonale from previous problems with obstructive sleep apnea. She had a repeat sleep study performed about a month ago and reports that this showed no evidence of residual sleep apnea. She is no longer using CPAP.  Dyslipidemia Excellent lipid profile.  Diabetes mellitus Excellent hemoglobin A1c. Consider stopping metformin which may be contributed to weight loss.   No orders of the defined types were placed in this encounter.   Meds ordered this encounter  Medications  . NIFEDICAL XL 30 MG 24 hr tablet    Sig:     Holli Humbles, MD, [facility] [phone] office [phone] pager

## 2014-10-20 NOTE — Assessment & Plan Note (Signed)
In normal rhythm today and no symptoms of recurrent palpitations since her cardioversion. Continue anticoagulation indefinitely. She has only had one clinically evident episodes so far. Antiarrhythmics are not justified, nor is ablation at this point. She has moderate pulmonary hypertension and right heart enlargement which probably explain the occurrence of atrial flutter.  It is likely that she has residual cor pulmonale from previous problems with obstructive sleep apnea. She had a repeat sleep study performed about a month ago and reports that this showed no evidence of residual sleep apnea. She is no longer using CPAP.

## 2014-10-20 NOTE — Assessment & Plan Note (Signed)
Excellent hemoglobin A1c. Consider stopping metformin which may be contributed to weight loss.

## 2014-10-20 NOTE — Assessment & Plan Note (Signed)
She does not have intestinal angina but I wonder whether her celiac artery stenosis could be playing a role in her unexplained weight loss. Will perform an abdominal aortic ultrasound to evaluate the celiac and superior mesenteric arteries, also reevaluate her known renal artery stenosis.

## 2014-10-20 NOTE — Assessment & Plan Note (Signed)
Excellent lipid profile.

## 2014-10-24 ENCOUNTER — Encounter: Payer: Self-pay | Admitting: Cardiovascular Disease

## 2014-10-31 ENCOUNTER — Telehealth (HOSPITAL_COMMUNITY): Payer: Self-pay | Admitting: *Deleted

## 2014-11-02 ENCOUNTER — Encounter (HOSPITAL_COMMUNITY): Payer: Self-pay

## 2014-11-02 ENCOUNTER — Ambulatory Visit (HOSPITAL_COMMUNITY)
Admission: RE | Admit: 2014-11-02 | Discharge: 2014-11-02 | Disposition: A | Discharge status: Home / Self Care | Payer: Medicare Other | Source: Ambulatory Visit | Attending: Cardiovascular Disease | Admitting: Cardiovascular Disease

## 2014-11-02 DIAGNOSIS — K559 Vascular disorder of intestine, unspecified: Secondary | ICD-10-CM

## 2014-11-02 NOTE — Progress Notes (Signed)
Aorta Duplex Completed. No evidence of AAA. Oda Cogan, BS, RDMS, RVT

## 2014-11-03 ENCOUNTER — Other Ambulatory Visit (HOSPITAL_COMMUNITY): Payer: Self-pay | Admitting: Cardiovascular Disease

## 2014-11-03 DIAGNOSIS — I701 Atherosclerosis of renal artery: Secondary | ICD-10-CM

## 2014-11-04 ENCOUNTER — Ambulatory Visit (HOSPITAL_COMMUNITY)
Admission: RE | Admit: 2014-11-04 | Discharge: 2014-11-04 | Disposition: A | Discharge status: Home / Self Care | Payer: Medicare Other | Source: Ambulatory Visit | Attending: Internal Medicine | Admitting: Internal Medicine

## 2014-11-04 DIAGNOSIS — E119 Type 2 diabetes mellitus without complications: Secondary | ICD-10-CM | POA: Diagnosis not present

## 2014-11-04 DIAGNOSIS — I10 Essential (primary) hypertension: Secondary | ICD-10-CM | POA: Insufficient documentation

## 2014-11-04 DIAGNOSIS — I701 Atherosclerosis of renal artery: Secondary | ICD-10-CM | POA: Diagnosis not present

## 2014-11-04 DIAGNOSIS — E785 Hyperlipidemia, unspecified: Secondary | ICD-10-CM | POA: Diagnosis not present

## 2014-11-04 NOTE — Progress Notes (Addendum)
Renal Duplex Completed. There was no evidence of evidence of celiac artery, SMA or renal artery stenosis noted.  Oda Cogan, BS, RDMS, RVT

## 2014-11-08 ENCOUNTER — Telehealth: Payer: Self-pay | Admitting: *Deleted

## 2014-11-08 NOTE — Telephone Encounter (Signed)
-----   Message from Sanda Klein, MD sent at 11/07/2014  5:06 PM EST ----- No sign of blockages in intestinal arteries on Doppler

## 2014-11-09 MED ORDER — NIFEDIPINE ER OSMOTIC RELEASE 30 MG PO TB24: 30.0000 mg | ORAL_TABLET | Freq: Every day | ORAL | Status: DC

## 2014-11-09 NOTE — Telephone Encounter (Signed)
-----   Message from Sanda Klein, MD sent at 11/07/2014  5:06 PM EST ----- No sign of blockages in intestinal arteries on Doppler

## 2014-11-09 NOTE — Telephone Encounter (Signed)
Message left with [name] to have his mother call with test results and medication adjustments.  8:04am  Patient came by the office about the phone call this AM.  Very nervous.  States she has not been on nifedical since June when her PCP D/C'd.  Will restart 30mg  daily - Rx sent to pharmacy.  Patient voiced understanding and will continue to keep a log and send into the office.  Info sent to Dr. Loletha Grayer.

## 2014-11-09 NOTE — Telephone Encounter (Signed)
Results of renal dopplers given.  Patient voiced understanding.

## 2014-12-29 ENCOUNTER — Other Ambulatory Visit: Payer: Medicare Other

## 2015-01-09 ENCOUNTER — Ambulatory Visit (INDEPENDENT_AMBULATORY_CARE_PROVIDER_SITE_OTHER): Payer: Medicare Other | Admitting: Podiatry

## 2015-01-09 ENCOUNTER — Telehealth: Payer: Self-pay

## 2015-01-09 DIAGNOSIS — B351 Tinea unguium: Secondary | ICD-10-CM | POA: Diagnosis not present

## 2015-01-09 DIAGNOSIS — M79673 Pain in unspecified foot: Secondary | ICD-10-CM | POA: Diagnosis not present

## 2015-01-09 DIAGNOSIS — B353 Tinea pedis: Secondary | ICD-10-CM

## 2015-01-09 NOTE — Telephone Encounter (Signed)
Left message for patient telling her that I contacted [facility] and I was awaiting their response in regards to an ointment for her foot

## 2015-01-11 NOTE — Progress Notes (Addendum)
Patient ID: Donna Owens, female   DOB: 1928-04-12, 81 y.o.   MRN: [id number]  Subjective: 81 year old female returns the office for painful, elongated, discolored toenails which are painful particularly with pressure. She denies any recent redness or drainage from around the nail Owens. She also states that she has some areas of dry scaly patches on her left foot along the outside aspect of the foot and on the bottom of her foot. She states that she previously had these areas since she was referred to dermatology, Dr. Leanna Battles, for similar issue. She states that that time she was prescribed some kind appointment to applied to the area which resolved the lesions. The past couple months she states that she has had reoccurrence and is asking if she should see Dr. Philip Aspen again. She denies any redness or drainage from these lesions and denies any itching. No other complaints at this time.  Objective: AAO 3, NAD DP/PT pulses palpable, CRT less than 3 seconds Protective sensation appears to be intact with Derrel Nip monofilament, Achilles tendon reflex intact. Nails hypertrophic, dystrophic, elongated, brittle, discolored 10. No surrounding erythema or drainage from the nail borders. On the left lateral aspect of the foot there is an annular area of dried, scaly skin. There is no underlying erythema, edema or signs of infection. There is no tenderness associated lesion. There is also a smaller area on the plantar aspect of the left midfoot. No other lesions identified bilaterally. No other open lesions or pre-ulcerative lesions. No pain with calf compression, swelling, warmth, erythema. No areas of tenderness to bilateral lower extremities other than the nails. No overlying edema, erythema, increase in warmth to bilateral lower extremities.  Assessment: 81 year old female presents for symptomatic onychomycosis; left foot scaly rash  Plan: -Nail sharply debrided 10 without  complication/bleeding. -Discussed possible etiologies of the left foot lesions. It appears almost to be and eczema-type rash. Will contact Dr. Shon Baton office to try to identify which cream was prescribed or will refer to him for further evaluation. -Follow-up in 3 months or sooner should any problems arise. In the meantime, encouraged to call the office with any questions, concerns, change in symptoms. -Also of note the patient had a 3 PM scheduled appointment and she was very upset with me when I walked in the room to see her at 3:15 PM. She will follow up with Dr. Paulla Dolly, as she normally sees him.    *Spoke to the dermatology office on 1/20 and she previously had triamcinolone cream 0.1%. Will order

## 2015-01-12 ENCOUNTER — Other Ambulatory Visit: Payer: Self-pay | Admitting: Podiatry

## 2015-01-12 ENCOUNTER — Telehealth: Payer: Self-pay | Admitting: *Deleted

## 2015-01-12 MED ORDER — TRIAMCINOLONE ACETONIDE 0.1 % EX CREA
1.0000 | TOPICAL_CREAM | Freq: Two times a day (BID) | CUTANEOUS | Status: DC
Start: 2015-01-12 — End: 2018-07-22

## 2015-01-12 NOTE — Telephone Encounter (Signed)
Informed pt the Triamcinolone 0.1 % 30 gram had been called to [facility] on [address].

## 2015-01-25 ENCOUNTER — Emergency Department (HOSPITAL_COMMUNITY): Payer: Medicare Other

## 2015-01-25 ENCOUNTER — Encounter (HOSPITAL_COMMUNITY): Payer: Self-pay

## 2015-01-25 ENCOUNTER — Emergency Department (HOSPITAL_COMMUNITY)
Admission: EM | Admit: 2015-01-25 | Discharge: 2015-01-25 | Disposition: A | Discharge status: Home / Self Care | Payer: Medicare Other | Attending: Emergency Medicine | Admitting: Emergency Medicine

## 2015-01-25 DIAGNOSIS — G4733 Obstructive sleep apnea (adult) (pediatric): Secondary | ICD-10-CM | POA: Diagnosis not present

## 2015-01-25 DIAGNOSIS — Z9981 Dependence on supplemental oxygen: Secondary | ICD-10-CM | POA: Diagnosis not present

## 2015-01-25 DIAGNOSIS — Z8742 Personal history of other diseases of the female genital tract: Secondary | ICD-10-CM | POA: Diagnosis not present

## 2015-01-25 DIAGNOSIS — Z79899 Other long term (current) drug therapy: Secondary | ICD-10-CM | POA: Diagnosis not present

## 2015-01-25 DIAGNOSIS — R42 Dizziness and giddiness: Secondary | ICD-10-CM | POA: Insufficient documentation

## 2015-01-25 DIAGNOSIS — Z7902 Long term (current) use of antithrombotics/antiplatelets: Secondary | ICD-10-CM | POA: Diagnosis not present

## 2015-01-25 DIAGNOSIS — Z853 Personal history of malignant neoplasm of breast: Secondary | ICD-10-CM | POA: Insufficient documentation

## 2015-01-25 DIAGNOSIS — Z862 Personal history of diseases of the blood and blood-forming organs and certain disorders involving the immune mechanism: Secondary | ICD-10-CM | POA: Diagnosis not present

## 2015-01-25 DIAGNOSIS — Z7952 Long term (current) use of systemic steroids: Secondary | ICD-10-CM | POA: Insufficient documentation

## 2015-01-25 DIAGNOSIS — Z8673 Personal history of transient ischemic attack (TIA), and cerebral infarction without residual deficits: Secondary | ICD-10-CM | POA: Insufficient documentation

## 2015-01-25 DIAGNOSIS — E119 Type 2 diabetes mellitus without complications: Secondary | ICD-10-CM | POA: Insufficient documentation

## 2015-01-25 DIAGNOSIS — R51 Headache: Secondary | ICD-10-CM | POA: Diagnosis not present

## 2015-01-25 DIAGNOSIS — I10 Essential (primary) hypertension: Secondary | ICD-10-CM | POA: Diagnosis not present

## 2015-01-25 LAB — I-STAT TROPONIN, ED: TROPONIN I, POC: 0 ng/mL (ref 0.00–0.08)

## 2015-01-25 LAB — CBC WITH DIFFERENTIAL/PLATELET
Basophils Absolute: 0 10*3/uL (ref 0.0–0.1)
Basophils Relative: 0 % (ref 0–1)
EOS ABS: 0.2 10*3/uL (ref 0.0–0.7)
Eosinophils Relative: 3 % (ref 0–5)
HCT: 33.2 % — ABNORMAL LOW (ref 36.0–46.0)
HEMOGLOBIN: 10.9 g/dL — AB (ref 12.0–15.0)
LYMPHS PCT: 19 % (ref 12–46)
Lymphs Abs: 1.1 10*3/uL (ref 0.7–4.0)
MCH: 30.2 pg (ref 26.0–34.0)
MCHC: 32.8 g/dL (ref 30.0–36.0)
MCV: 92 fL (ref 78.0–100.0)
MONOS PCT: 9 % (ref 3–12)
Monocytes Absolute: 0.5 10*3/uL (ref 0.1–1.0)
Neutro Abs: 4 10*3/uL (ref 1.7–7.7)
Neutrophils Relative %: 69 % (ref 43–77)
Platelets: 167 10*3/uL (ref 150–400)
RBC: 3.61 MIL/uL — AB (ref 3.87–5.11)
RDW: 14 % (ref 11.5–15.5)
WBC: 5.8 10*3/uL (ref 4.0–10.5)

## 2015-01-25 LAB — URINALYSIS, ROUTINE W REFLEX MICROSCOPIC
Bilirubin Urine: NEGATIVE
GLUCOSE, UA: NEGATIVE mg/dL
Hgb urine dipstick: NEGATIVE
Ketones, ur: NEGATIVE mg/dL
Leukocytes, UA: NEGATIVE
NITRITE: NEGATIVE
PH: 7.5 (ref 5.0–8.0)
Protein, ur: NEGATIVE mg/dL
Specific Gravity, Urine: 1.008 (ref 1.005–1.030)
Urobilinogen, UA: 0.2 mg/dL (ref 0.0–1.0)

## 2015-01-25 LAB — BASIC METABOLIC PANEL
Anion gap: 6 (ref 5–15)
BUN: 22 mg/dL (ref 6–23)
CALCIUM: 9.2 mg/dL (ref 8.4–10.5)
CHLORIDE: 105 mmol/L (ref 96–112)
CO2: 25 mmol/L (ref 19–32)
CREATININE: 1.39 mg/dL — AB (ref 0.50–1.10)
GFR calc Af Amer: 39 mL/min — ABNORMAL LOW (ref >90)
GFR calc non Af Amer: 34 mL/min — ABNORMAL LOW (ref >90)
GLUCOSE: 98 mg/dL (ref 70–99)
POTASSIUM: 4.3 mmol/L (ref 3.5–5.1)
Sodium: 136 mmol/L (ref 135–145)

## 2015-01-25 LAB — CBG MONITORING, ED: Glucose-Capillary: 89 mg/dL (ref 70–99)

## 2015-01-25 MED ORDER — LORAZEPAM 2 MG/ML IJ SOLN
0.5000 mg | Freq: Once | INTRAMUSCULAR | Status: AC
  Administered 2015-01-25: 0.5 mg via INTRAVENOUS
  Filled 2015-01-25: qty 1

## 2015-01-25 NOTE — ED Notes (Signed)
Pt unable to stand for othostatic vitals she stated she felt dizzy

## 2015-01-25 NOTE — ED Notes (Signed)
She awoke to find herself "lightheaded".  She denies any sensation of spinning or rotating.  She states she has a remote history of this "but not for a long time and it wasn't this bad".  She is alert and oriented x 4 with clear speech.  Her granddaughter is with her.  She moves all extremities with ease on command.  She denies any pain, but finds this sensation of "lightheadedness" to be disconcerting.  Her skin is normal, warm and dry and she is breathing normally.

## 2015-01-25 NOTE — ED Notes (Signed)
Pt claustrophobic; given sedation prior to transport.

## 2015-01-25 NOTE — ED Notes (Signed)
Pt transported to MRI 

## 2015-01-25 NOTE — Discharge Instructions (Signed)
Follow up with your doctor for further evaluation as needed. Return to the ED with worsening or concerning symptoms.

## 2015-01-25 NOTE — ED Provider Notes (Signed)
CSN: [id number]     Arrival date & time 01/25/15  0923 History   First MD Initiated Contact with Patient 01/25/15 [phone]     Chief Complaint  Patient presents with  . Dizziness     (Consider location/radiation/quality/duration/timing/severity/associated sxs/prior Treatment) HPI Comments: Pt is an 81 year old female who presents to the ED c/o dizziness and difficulty focusing her vision. She states she hasn't been feeling herself x1wk. When asked to elaborate she has difficulty just stating she feels "cloudy." This morning she woke up with dizziness and room spinning sensation at 0600. This sensation is continuous. She also c/o that she has been unable to focus her vision. She endorses improvement in the dizziness/room spinning feeling since this morning, but still is having trouble focusing her vision. The dizziness is improved with seated or supine position today, but she denies positional exacerbations over the last week. She denies any near syncope, loss of vision, stiff neck, CP, palpitations, SOB/DOE, NVD, urinary symptoms, or numbness/tingling in extremities past baseline. The patient is currently anticoagulated and does note hitting her head x3d ago on the car door frame while attempting to exit car, but denies any worsening symptoms immediately after.  Patient is a 81 y.o. female presenting with dizziness.  Dizziness Associated symptoms: no chest pain, no diarrhea, no nausea, no palpitations, no shortness of breath and no vomiting     Past Medical History  Diagnosis Date  . Diabetes mellitus   . Hypertension   . Cancer   . Hyperlipemia   . Pulmonary hypertension     moderate echo 11/07/10  . Carotid arterial disease 12/13/11     mild by doppler  . PVD (peripheral vascular disease)   . OSA (obstructive sleep apnea)   . Anemia   . Atrophic vaginitis   . Sleep apnea   . Osteoporosis   . CVA (cerebral vascular accident)   . Blood transfusion without reported diagnosis   . OSA on  CPAP 06/20/2014   Past Surgical History  Procedure Laterality Date  . Breast surgery      right 07/26/2000  . Breast lumpectomy Right     chemo/radiation  . Breast lumpectomy Left     benign  . Colonoscopy  2010  . Ingrown toenails      removed in 03/2013  . Cardioversion N/A 07/21/2014    Procedure: CARDIOVERSION;  Surgeon: Sanda Klein, MD;  Location: [facility];  Service: Cardiovascular;  Laterality: N/A;   Family History  Problem Relation Age of Onset  . Diabetes Other   . Diabetes Brother   . Heart failure Daughter 40   History  Substance Use Topics  . Smoking status: Never Smoker   . Smokeless tobacco: Never Used  . Alcohol Use: No   OB History    Gravida Para Term Preterm AB TAB SAB Ectopic Multiple Living   4 2   2  2   1      Review of Systems  Constitutional: Negative for fever, chills and fatigue.  HENT: Negative for trouble swallowing.   Eyes: Negative for visual disturbance.  Respiratory: Negative for shortness of breath.   Cardiovascular: Negative for chest pain and palpitations.  Gastrointestinal: Negative for nausea, vomiting, abdominal pain and diarrhea.  Genitourinary: Negative for dysuria and difficulty urinating.  Musculoskeletal: Negative for arthralgias and neck pain.  Skin: Negative for color change.  Neurological: Positive for dizziness and light-headedness. Negative for weakness.  Psychiatric/Behavioral: Negative for dysphoric mood.      Allergies  Review of patient's allergies indicates no known allergies.  Home Medications   Prior to Admission medications   Medication Sig Start Date End Date Taking? Authorizing Provider  acetaminophen (TYLENOL) 500 MG tablet Take 500 mg by mouth every 6 (six) hours as needed for moderate pain.   Yes Historical Provider, MD  apixaban (ELIQUIS) 5 MG TABS tablet Take 1 tablet (5 mg total) by mouth 2 (two) times daily. 08/31/14  Yes Mihai Croitoru, MD  calcium-vitamin D (OSCAL WITH D) 500-200 MG-UNIT per  tablet Take 1 tablet by mouth daily with breakfast.    Yes Historical Provider, MD  carvedilol (COREG) 25 MG tablet Take 25 mg by mouth 2 (two) times daily with a meal.   Yes Historical Provider, MD  Cholecalciferol (VITAMIN D-3) 1000 UNITS CAPS Take 1 capsule by mouth daily.   Yes Historical Provider, MD  Coenzyme Q10 (CO Q 10) 100 MG CAPS Take 1 capsule by mouth daily.   Yes Historical Provider, MD  diazepam (VALIUM) 5 MG tablet Take 5 mg by mouth every 8 (eight) hours as needed for anxiety.  11/06/13  Yes Historical Provider, MD  Ferrous Sulfate (IRON) 325 (65 FE) MG TABS Take 1 tablet by mouth every other day.    Yes Historical Provider, MD  gabapentin (NEURONTIN) 100 MG capsule Take 100-200 mg by mouth at bedtime.    Yes Historical Provider, MD  glimepiride (AMARYL) 1 MG tablet Take 0.5 mg by mouth daily with breakfast.   Yes Historical Provider, MD  hydrOXYzine (ATARAX/VISTARIL) 25 MG tablet Take 25 mg by mouth 3 (three) times daily as needed for itching.   Yes Historical Provider, MD  lisinopril (PRINIVIL,ZESTRIL) 20 MG tablet Take 2 tablets (40 mg total) by mouth daily. 09/19/14  Yes Minus Breeding, MD  metFORMIN (GLUCOPHAGE-XR) 500 MG 24 hr tablet Take 500 mg by mouth 2 (two) times daily.   Yes Historical Provider, MD  pravastatin (PRAVACHOL) 40 MG tablet Take 40 mg by mouth daily.   Yes Historical Provider, MD  PREDNISOLONE ACETATE OP Place 1 drop into both eyes 4 (four) times daily.   Yes Historical Provider, MD  promethazine (PHENERGAN) 25 MG tablet Take 25 mg by mouth 3 (three) times daily as needed. nausea   Yes Historical Provider, MD  triamcinolone cream (KENALOG) 0.1 % Apply 1 application topically 2 (two) times daily. Patient taking differently: Apply 1 application topically 2 (two) times daily. On left foot. 01/12/15  Yes Celesta Gentile, DPM  vitamin B-12 (CYANOCOBALAMIN) 1000 MCG tablet Take 1,000 mcg by mouth daily.   Yes Historical Provider, MD  NIFEdipine (NIFEDICAL XL) 30 MG  24 hr tablet Take 1 tablet (30 mg total) by mouth daily. Patient not taking: Reported on 01/25/2015 11/09/14   Mihai Croitoru, MD   BP 167/65 mmHg  Pulse 73  Temp(Src) 98 F (36.7 C) (Oral)  Resp 18  SpO2 100% Physical Exam  Constitutional: She is oriented to person, place, and time. She appears well-developed and well-nourished. No distress.  HENT:  Head: Normocephalic and atraumatic.  Mouth/Throat: Oropharynx is clear and moist. No oropharyngeal exudate.  Eyes: Conjunctivae and EOM are normal. Pupils are equal, round, and reactive to light.  Neck: Normal range of motion.  Cardiovascular: Normal rate and regular rhythm.  Exam reveals no gallop and no friction rub.   No murmur heard. Pulmonary/Chest: Effort normal and breath sounds normal. She has no wheezes. She has no rales. She exhibits no tenderness.  Abdominal: Soft. She exhibits no distension. There  is no tenderness. There is no rebound.  Musculoskeletal: Normal range of motion.  Neurological: She is alert and oriented to person, place, and time. No cranial nerve deficit. Coordination normal.  Speech is goal-oriented. Moves limbs without ataxia. Patient not able to stand due to room spinning dizziness.   Skin: Skin is warm and dry.  Psychiatric: She has a normal mood and affect. Her behavior is normal.  Nursing note and vitals reviewed.   ED Course  Procedures (including critical care time) Labs Review Labs Reviewed  CBC WITH DIFFERENTIAL/PLATELET - Abnormal; Notable for the following:    RBC 3.61 (*)    Hemoglobin 10.9 (*)    HCT 33.2 (*)    All other components within normal limits  BASIC METABOLIC PANEL - Abnormal; Notable for the following:    Creatinine, Ser 1.39 (*)    GFR calc non Af Amer 34 (*)    GFR calc Af Amer 39 (*)    All other components within normal limits  URINALYSIS, ROUTINE W REFLEX MICROSCOPIC  CBG MONITORING, ED  I-STAT TROPOININ, ED    Imaging Review Ct Head Wo Contrast  01/25/2015   CLINICAL  DATA:  Lightheadedness today. Dizziness for 1 week. Initial encounter.  EXAM: CT HEAD WITHOUT CONTRAST  TECHNIQUE: Contiguous axial images were obtained from the base of the skull through the vertex without intravenous contrast.  COMPARISON:  MRI brain 03/02/2010.  PET-CT 12/07/2007.  FINDINGS: There is no evidence of acute intracranial hemorrhage, mass lesion, brain edema or extra-axial fluid collection. The ventricles and subarachnoid spaces are appropriately sized for age. There is no CT evidence of acute cortical infarction. Patchy periventricular white matter disease appears stable. Low lying cerebellar tonsils are noted without change.  The visualized paranasal sinuses, mastoid air cells and middle ears are clear. There is an 8 mm ossification just posterior to the left internal auditory canal which is unchanged from the prior studies. There is no widening of the IAC. There is also stable ossification anteriorly in the left middle cranial fossa. Both of these findings are noted on image 7. The calvarium is intact.  IMPRESSION: 1. No acute intracranial findings demonstrated. 2. 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   Electronically Signed   By: Camie Patience M.D.   On: 01/25/2015 10:56   Mr Brain Wo Contrast  01/25/2015   CLINICAL DATA:  Dizziness. Rule out infarct. Remote history breast cancer  EXAM: MRI HEAD WITHOUT CONTRAST  TECHNIQUE: Multiplanar, multiecho pulse sequences of the brain and surrounding structures were obtained without intravenous contrast.  COMPARISON:  MRI 03/02/2010  FINDINGS: Mild atrophy. Negative for hydrocephalus. Syrinx in the cervical spinal cord again noted and may be slightly larger compared with the prior study. More detailed evaluation could be obtained with cervical spine MRI  Negative for acute infarct. Mild chronic microvascular ischemic change in the white matter and pons.   Negative for hemorrhage.  Negative for mass or edema.  Mucosal edema in the left maxillary sinus.  IMPRESSION: Negative for acute infarct. Chronic microvascular ischemic changes in the white matter and pons  Cervical spinal cord syrinx appears larger compared with the prior study. Further evaluation could be obtained with cervical spine MRI without with contrast.   Electronically Signed   By: Franchot Gallo M.D.   On: 01/25/2015 12:57     EKG Interpretation   Date/Time:  Wednesday January 25 2015 09:33:17 EST Ventricular  Rate:  62 PR Interval:  131 QRS Duration: 73 QT Interval:  376 QTC Calculation: 382 R Axis:   11 Text Interpretation:  sinus bradycardia, ?heart block new since prior  tracing Long R-R with ventricular escape Low voltage, extremity and  precordial leads Anteroseptal infarct, old Baseline wander in lead(s) I II  aVR V4 Confirmed by KNAPP  MD-J, JON ([id number]) on 01/25/2015 9:41:37 AM      MDM   Final diagnoses:  Dizziness  Lightheadedness    1:36 PM Patient's labs, urinalysis and imaging unremarkable for acute changes. Patient's imaging unremarkable for acute changes. Patient initially not able to ambulate due to dizziness but her symptoms have resolved since being in the ED. In the setting of unremarkable MRI and improvement of symptoms, patient can likely be discharged. Patient feels comfortable going home and has follow up with her PCP tomorrow. Patient instructed to return with worsening or concerning symptoms.     Alvina Chou, PA-C 01/25/15 [place] Alvino Chapel, MD 01/26/15 [phone]

## 2015-01-26 DIAGNOSIS — E785 Hyperlipidemia, unspecified: Secondary | ICD-10-CM | POA: Diagnosis not present

## 2015-01-26 DIAGNOSIS — I10 Essential (primary) hypertension: Secondary | ICD-10-CM | POA: Diagnosis not present

## 2015-01-26 DIAGNOSIS — Z008 Encounter for other general examination: Secondary | ICD-10-CM | POA: Diagnosis not present

## 2015-01-26 DIAGNOSIS — E1151 Type 2 diabetes mellitus with diabetic peripheral angiopathy without gangrene: Secondary | ICD-10-CM | POA: Diagnosis not present

## 2015-02-03 DIAGNOSIS — R634 Abnormal weight loss: Secondary | ICD-10-CM | POA: Diagnosis not present

## 2015-02-03 DIAGNOSIS — E785 Hyperlipidemia, unspecified: Secondary | ICD-10-CM | POA: Diagnosis not present

## 2015-02-03 DIAGNOSIS — R269 Unspecified abnormalities of gait and mobility: Secondary | ICD-10-CM | POA: Diagnosis not present

## 2015-02-03 DIAGNOSIS — M545 Low back pain: Secondary | ICD-10-CM | POA: Diagnosis not present

## 2015-02-03 DIAGNOSIS — G4733 Obstructive sleep apnea (adult) (pediatric): Secondary | ICD-10-CM | POA: Diagnosis not present

## 2015-02-03 DIAGNOSIS — I739 Peripheral vascular disease, unspecified: Secondary | ICD-10-CM | POA: Diagnosis not present

## 2015-02-03 DIAGNOSIS — N183 Chronic kidney disease, stage 3 (moderate): Secondary | ICD-10-CM | POA: Diagnosis not present

## 2015-02-03 DIAGNOSIS — E1151 Type 2 diabetes mellitus with diabetic peripheral angiopathy without gangrene: Secondary | ICD-10-CM | POA: Diagnosis not present

## 2015-02-03 DIAGNOSIS — Z0000 Encounter for general adult medical examination without abnormal findings: Secondary | ICD-10-CM | POA: Diagnosis not present

## 2015-02-03 DIAGNOSIS — I4892 Unspecified atrial flutter: Secondary | ICD-10-CM | POA: Diagnosis not present

## 2015-02-03 DIAGNOSIS — F329 Major depressive disorder, single episode, unspecified: Secondary | ICD-10-CM | POA: Diagnosis not present

## 2015-02-03 DIAGNOSIS — Z6821 Body mass index (BMI) 21.0-21.9, adult: Secondary | ICD-10-CM | POA: Diagnosis not present

## 2015-02-09 DIAGNOSIS — Z1212 Encounter for screening for malignant neoplasm of rectum: Secondary | ICD-10-CM | POA: Diagnosis not present

## 2015-02-15 DIAGNOSIS — N183 Chronic kidney disease, stage 3 (moderate): Secondary | ICD-10-CM | POA: Diagnosis not present

## 2015-02-15 DIAGNOSIS — M810 Age-related osteoporosis without current pathological fracture: Secondary | ICD-10-CM | POA: Diagnosis not present

## 2015-03-15 ENCOUNTER — Other Ambulatory Visit: Payer: Self-pay

## 2015-03-15 DIAGNOSIS — Z1231 Encounter for screening mammogram for malignant neoplasm of breast: Secondary | ICD-10-CM

## 2015-03-31 ENCOUNTER — Ambulatory Visit
Admission: RE | Admit: 2015-03-31 | Discharge: 2015-03-31 | Disposition: A | Discharge status: Home / Self Care | Payer: Medicare Other | Source: Ambulatory Visit

## 2015-03-31 DIAGNOSIS — Z1231 Encounter for screening mammogram for malignant neoplasm of breast: Secondary | ICD-10-CM | POA: Diagnosis not present

## 2015-04-13 ENCOUNTER — Ambulatory Visit (INDEPENDENT_AMBULATORY_CARE_PROVIDER_SITE_OTHER): Payer: Medicare Other

## 2015-04-13 DIAGNOSIS — M79673 Pain in unspecified foot: Secondary | ICD-10-CM | POA: Diagnosis not present

## 2015-04-13 DIAGNOSIS — B351 Tinea unguium: Secondary | ICD-10-CM

## 2015-04-13 MED ORDER — METANX 3-35-2 MG PO TABS: 1.0000 | ORAL_TABLET | Freq: Two times a day (BID) | ORAL | Status: DC

## 2015-04-13 NOTE — Progress Notes (Signed)
Presents today chief complaint of painful elongated toenails.  Objective: Pulses are palpable bilateral nails are thick, yellow dystrophic onychomycosis and painful palpation.   Assessment: Onychomycosis with pain in limb.  Plan: Treatment of nails in thickness and length as covered service secondary to pain.  

## 2015-04-18 ENCOUNTER — Telehealth: Payer: Self-pay | Admitting: Nurse Practitioner

## 2015-04-18 ENCOUNTER — Encounter (HOSPITAL_COMMUNITY): Payer: Self-pay | Admitting: *Deleted

## 2015-04-18 ENCOUNTER — Emergency Department (HOSPITAL_COMMUNITY)
Admission: EM | Admit: 2015-04-18 | Discharge: 2015-04-18 | Disposition: A | Discharge status: Home / Self Care | Payer: Medicare Other | Attending: Emergency Medicine | Admitting: Emergency Medicine

## 2015-04-18 ENCOUNTER — Emergency Department (HOSPITAL_COMMUNITY): Payer: Medicare Other

## 2015-04-18 DIAGNOSIS — R471 Dysarthria and anarthria: Secondary | ICD-10-CM | POA: Diagnosis not present

## 2015-04-18 DIAGNOSIS — R200 Anesthesia of skin: Secondary | ICD-10-CM | POA: Diagnosis not present

## 2015-04-18 DIAGNOSIS — E785 Hyperlipidemia, unspecified: Secondary | ICD-10-CM | POA: Diagnosis not present

## 2015-04-18 DIAGNOSIS — R479 Unspecified speech disturbances: Secondary | ICD-10-CM | POA: Insufficient documentation

## 2015-04-18 DIAGNOSIS — Z7952 Long term (current) use of systemic steroids: Secondary | ICD-10-CM | POA: Diagnosis not present

## 2015-04-18 DIAGNOSIS — G4733 Obstructive sleep apnea (adult) (pediatric): Secondary | ICD-10-CM | POA: Diagnosis not present

## 2015-04-18 DIAGNOSIS — Z79899 Other long term (current) drug therapy: Secondary | ICD-10-CM | POA: Insufficient documentation

## 2015-04-18 DIAGNOSIS — Z859 Personal history of malignant neoplasm, unspecified: Secondary | ICD-10-CM | POA: Diagnosis not present

## 2015-04-18 DIAGNOSIS — D329 Benign neoplasm of meninges, unspecified: Secondary | ICD-10-CM | POA: Diagnosis not present

## 2015-04-18 DIAGNOSIS — I10 Essential (primary) hypertension: Secondary | ICD-10-CM | POA: Diagnosis not present

## 2015-04-18 DIAGNOSIS — Z9981 Dependence on supplemental oxygen: Secondary | ICD-10-CM | POA: Diagnosis not present

## 2015-04-18 DIAGNOSIS — E860 Dehydration: Secondary | ICD-10-CM | POA: Insufficient documentation

## 2015-04-18 DIAGNOSIS — Z8742 Personal history of other diseases of the female genital tract: Secondary | ICD-10-CM | POA: Insufficient documentation

## 2015-04-18 DIAGNOSIS — M810 Age-related osteoporosis without current pathological fracture: Secondary | ICD-10-CM | POA: Insufficient documentation

## 2015-04-18 DIAGNOSIS — R42 Dizziness and giddiness: Secondary | ICD-10-CM | POA: Diagnosis present

## 2015-04-18 DIAGNOSIS — E119 Type 2 diabetes mellitus without complications: Secondary | ICD-10-CM | POA: Diagnosis not present

## 2015-04-18 DIAGNOSIS — D649 Anemia, unspecified: Secondary | ICD-10-CM | POA: Insufficient documentation

## 2015-04-18 LAB — CBC WITH DIFFERENTIAL/PLATELET
BASOS ABS: 0 10*3/uL (ref 0.0–0.1)
Basophils Relative: 0 % (ref 0–1)
Eosinophils Absolute: 0.2 10*3/uL (ref 0.0–0.7)
Eosinophils Relative: 2 % (ref 0–5)
HEMATOCRIT: 35.6 % — AB (ref 36.0–46.0)
Hemoglobin: 11.6 g/dL — ABNORMAL LOW (ref 12.0–15.0)
LYMPHS ABS: 1 10*3/uL (ref 0.7–4.0)
LYMPHS PCT: 15 % (ref 12–46)
MCH: 30.1 pg (ref 26.0–34.0)
MCHC: 32.6 g/dL (ref 30.0–36.0)
MCV: 92.5 fL (ref 78.0–100.0)
Monocytes Absolute: 0.5 10*3/uL (ref 0.1–1.0)
Monocytes Relative: 7 % (ref 3–12)
Neutro Abs: 5 10*3/uL (ref 1.7–7.7)
Neutrophils Relative %: 76 % (ref 43–77)
PLATELETS: 186 10*3/uL (ref 150–400)
RBC: 3.85 MIL/uL — ABNORMAL LOW (ref 3.87–5.11)
RDW: 13.8 % (ref 11.5–15.5)
WBC: 6.6 10*3/uL (ref 4.0–10.5)

## 2015-04-18 LAB — URINALYSIS, ROUTINE W REFLEX MICROSCOPIC
BILIRUBIN URINE: NEGATIVE
GLUCOSE, UA: NEGATIVE mg/dL
Hgb urine dipstick: NEGATIVE
Ketones, ur: NEGATIVE mg/dL
Leukocytes, UA: NEGATIVE
Nitrite: NEGATIVE
Protein, ur: NEGATIVE mg/dL
Specific Gravity, Urine: 1.013 (ref 1.005–1.030)
Urobilinogen, UA: 0.2 mg/dL (ref 0.0–1.0)
pH: 7 (ref 5.0–8.0)

## 2015-04-18 LAB — BASIC METABOLIC PANEL
Anion gap: 6 (ref 5–15)
BUN: 28 mg/dL — AB (ref 6–23)
CALCIUM: 9.3 mg/dL (ref 8.4–10.5)
CO2: 25 mmol/L (ref 19–32)
Chloride: 108 mmol/L (ref 96–112)
Creatinine, Ser: 1.19 mg/dL — ABNORMAL HIGH (ref 0.50–1.10)
GFR calc Af Amer: 47 mL/min — ABNORMAL LOW (ref >90)
GFR calc non Af Amer: 40 mL/min — ABNORMAL LOW (ref >90)
GLUCOSE: 127 mg/dL — AB (ref 70–99)
Potassium: 4 mmol/L (ref 3.5–5.1)
SODIUM: 139 mmol/L (ref 135–145)

## 2015-04-18 LAB — CBG MONITORING, ED: GLUCOSE-CAPILLARY: 76 mg/dL (ref 70–99)

## 2015-04-18 MED ORDER — ACETAMINOPHEN 325 MG PO TABS
650.0000 mg | ORAL_TABLET | Freq: Once | ORAL | Status: AC
Start: 2015-04-18 — End: 2015-04-18
  Administered 2015-04-18: 650 mg via ORAL
  Filled 2015-04-18: qty 2

## 2015-04-18 MED ORDER — SODIUM CHLORIDE 0.9 % IV BOLUS (SEPSIS)
500.0000 mL | Freq: Once | INTRAVENOUS | Status: AC
  Administered 2015-04-18: 500 mL via INTRAVENOUS

## 2015-04-18 MED ORDER — DIAZEPAM 5 MG PO TABS
5.0000 mg | ORAL_TABLET | Freq: Once | ORAL | Status: AC
  Administered 2015-04-18: 5 mg via ORAL
  Filled 2015-04-18: qty 1

## 2015-04-18 MED ORDER — DIAZEPAM 5 MG PO TABS: 5.0000 mg | ORAL_TABLET | Freq: Three times a day (TID) | ORAL | Status: DC | PRN

## 2015-04-18 NOTE — ED Notes (Addendum)
Patient states she has been having episodes of dizziness on and off for a few months,but more severe this month. She states that it occurs with movement and at rest. She takes tylenol and believes that it helps. She has tingling in her extremities but has a history of diabetic neuropathy. She does complain of numbness and heaviness only in the left arm at times.She denies changes in vision, but states that during these episodes she has difficulty speaking. She knows what she wants to say but cannot formulate the words. Patient is speaking clearly and coherently at this time, but states the last episode was this morning.

## 2015-04-18 NOTE — Telephone Encounter (Addendum)
FYI ;Patient canceled her aex 05/18/15 (patient thought her appointment was today) with [facility]. Patient is on her way to the ER, not feeling well. Patient will call later to reschedule.

## 2015-04-18 NOTE — ED Provider Notes (Signed)
CSN: [id number]     Arrival date & time 04/18/15  1134 History   First MD Initiated Contact with Patient 04/18/15 1217     Chief Complaint  Patient presents with  . Dizziness     (Consider location/radiation/quality/duration/timing/severity/associated sxs/prior Treatment) HPI  This is an 81 year old female with history of diabetes, hypertension, hyperlipidemia, coronary artery disease who presents with dizziness. Patient reports several months history of intermittent headache and dizziness. She describes dizziness as feeling off balance. Denies any room spinning dizziness. Denies any weakness, numbness, tingling, vision changes.  Patient reports intermittent occipital headache. She also reports in room and speech difficulties. She describes difficulty formulating words. She denies any chest pain or shortness of breath. She states that sometimes she feels her left arm goes numb. She has not seen her primary doctor for this. She was seen in February with similar complaints that have had an MRI to rule out stroke as a cause of her symptoms. MRI negative.  Past Medical History  Diagnosis Date  . Diabetes mellitus   . Hypertension   . Cancer   . Hyperlipemia   . Pulmonary hypertension     moderate echo 11/07/10  . Carotid arterial disease 12/13/11     mild by doppler  . PVD (peripheral vascular disease)   . OSA (obstructive sleep apnea)   . Anemia   . Atrophic vaginitis   . Sleep apnea   . Osteoporosis   . CVA (cerebral vascular accident)   . Blood transfusion without reported diagnosis   . OSA on CPAP 06/20/2014   Past Surgical History  Procedure Laterality Date  . Breast surgery      right 07/26/2000  . Breast lumpectomy Right     chemo/radiation  . Breast lumpectomy Left     benign  . Colonoscopy  2010  . Ingrown toenails      removed in 03/2013  . Cardioversion N/A 07/21/2014    Procedure: CARDIOVERSION;  Surgeon: Sanda Klein, MD;  Location: [facility];  Service:  Cardiovascular;  Laterality: N/A;   Family History  Problem Relation Age of Onset  . Diabetes Other   . Diabetes Brother   . Heart failure Daughter 40   History  Substance Use Topics  . Smoking status: Never Smoker   . Smokeless tobacco: Never Used  . Alcohol Use: No   OB History    Gravida Para Term Preterm AB TAB SAB Ectopic Multiple Living   4 2   2  2   1      Review of Systems  Constitutional: Negative for fever.  Respiratory: Negative for cough, chest tightness and shortness of breath.   Cardiovascular: Negative for chest pain.  Gastrointestinal: Negative for nausea, vomiting and abdominal pain.  Genitourinary: Negative for dysuria.  Musculoskeletal: Negative for back pain.  Neurological: Positive for dizziness, speech difficulty and numbness. Negative for tremors and headaches.  Psychiatric/Behavioral: Negative for confusion.  All other systems reviewed and are negative.     Allergies  Review of patient's allergies indicates no known allergies.  Home Medications   Prior to Admission medications   Medication Sig Start Date End Date Taking? Authorizing Provider  acetaminophen (TYLENOL) 500 MG tablet Take 500 mg by mouth every 6 (six) hours as needed for moderate pain.   Yes Historical Provider, MD  apixaban (ELIQUIS) 5 MG TABS tablet Take 1 tablet (5 mg total) by mouth 2 (two) times daily. 08/31/14  Yes Mihai Croitoru, MD  calcium-vitamin D (OSCAL WITH D) 500-200  MG-UNIT per tablet Take 1 tablet by mouth daily with breakfast.    Yes Historical Provider, MD  carvedilol (COREG) 25 MG tablet Take 25 mg by mouth 2 (two) times daily with a meal.   Yes Historical Provider, MD  Cholecalciferol (VITAMIN D-3) 1000 UNITS CAPS Take 1 capsule by mouth daily.   Yes Historical Provider, MD  Coenzyme Q10 (CO Q 10) 100 MG CAPS Take 1 capsule by mouth daily.   Yes Historical Provider, MD  glimepiride (AMARYL) 1 MG tablet Take 0.5 mg by mouth daily with breakfast.   Yes Historical  Provider, MD  hydrOXYzine (ATARAX/VISTARIL) 25 MG tablet Take 25 mg by mouth 3 (three) times daily as needed for itching.   Yes Historical Provider, MD  lisinopril (PRINIVIL,ZESTRIL) 20 MG tablet Take 2 tablets (40 mg total) by mouth daily. Patient taking differently: Take 20 mg by mouth 2 (two) times daily.  09/19/14  Yes Minus Breeding, MD  metFORMIN (GLUCOPHAGE-XR) 500 MG 24 hr tablet Take 500 mg by mouth 2 (two) times daily.   Yes Historical Provider, MD  Polyethyl Glycol-Propyl Glycol (SYSTANE OP) Apply 1 drop to eye 2 (two) times daily.   Yes Historical Provider, MD  pravastatin (PRAVACHOL) 40 MG tablet Take 40 mg by mouth daily.   Yes Historical Provider, MD  promethazine (PHENERGAN) 25 MG tablet Take 25 mg by mouth daily. nausea   Yes Historical Provider, MD  triamcinolone cream (KENALOG) 0.1 % Apply 1 application topically 2 (two) times daily. Patient taking differently: Apply 1 application topically 2 (two) times daily as needed (rash/sore). On left foot. 01/12/15  Yes Trula Slade, DPM  vitamin B-12 (CYANOCOBALAMIN) 1000 MCG tablet Take 1,000 mcg by mouth 2 (two) times daily.    Yes Historical Provider, MD  diazepam (VALIUM) 5 MG tablet Take 1 tablet (5 mg total) by mouth every 8 (eight) hours as needed (dizziness). 04/18/15   Merryl Hacker, MD  multivitamin (METANX) 3-35-2 MG TABS tablet Take 1 tablet by mouth 2 (two) times daily. Patient not taking: Reported on 04/18/2015 04/13/15   Harriet Masson, DPM  NIFEdipine (NIFEDICAL XL) 30 MG 24 hr tablet Take 1 tablet (30 mg total) by mouth daily. Patient not taking: Reported on 01/25/2015 11/09/14   Mihai Croitoru, MD   BP 163/60 mmHg  Pulse 63  Temp(Src) 98 F (36.7 C) (Oral)  Resp 16  SpO2 100% Physical Exam  Constitutional: She is oriented to person, place, and time. She appears well-developed and well-nourished. No distress.  HENT:  Head: Normocephalic and atraumatic.  Mouth/Throat: Oropharynx is clear and moist.  Eyes: EOM  are normal. Pupils are equal, round, and reactive to light.  Neck: Neck supple.  Cardiovascular: Normal rate, regular rhythm and normal heart sounds.   No murmur heard. Pulmonary/Chest: Effort normal and breath sounds normal. No respiratory distress. She has no wheezes.  Abdominal: Soft. Bowel sounds are normal. There is no tenderness.  Musculoskeletal: She exhibits no edema.  Neurological: She is alert and oriented to person, place, and time.  Cranial nerves II through XII intact, normal gait, 5 out of 5 strength in all 470s, no dysmetria to finger-nose-finger  Skin: Skin is warm and dry.  Psychiatric: She has a normal mood and affect.  Nursing note and vitals reviewed.   ED Course  Procedures (including critical care time) Labs Review Labs Reviewed  CBC WITH DIFFERENTIAL/PLATELET - Abnormal; Notable for the following:    RBC 3.85 (*)    Hemoglobin 11.6 (*)  HCT 35.6 (*)    All other components within normal limits  BASIC METABOLIC PANEL - Abnormal; Notable for the following:    Glucose, Bld 127 (*)    BUN 28 (*)    Creatinine, Ser 1.19 (*)    GFR calc non Af Amer 40 (*)    GFR calc Af Amer 47 (*)    All other components within normal limits  URINALYSIS, ROUTINE W REFLEX MICROSCOPIC  CBG MONITORING, ED    Imaging Review Ct Head (brain) Wo Contrast  04/18/2015   CLINICAL DATA:  Intermittent dizziness, progressing. Intermittent dysarthria  EXAM: CT HEAD WITHOUT CONTRAST  TECHNIQUE: Contiguous axial images were obtained from the base of the skull through the vertex without intravenous contrast.  COMPARISON:  Head CT January 25, 2015; brain MRI January 25, 2015  FINDINGS: Mild diffuse atrophy is stable. There is no intracranial mass effect, hemorrhage, extra-axial fluid collection, or midline shift. A calcified meningioma without surrounding edema measuring 8 x 6 mm slightly superior and medial to the left internal auditory canal is stable. A calcified meningioma without  surrounding edema along the lateral aspect of the left middle cranial fossa measuring 1.2 x 0.8 cm is also stable. No new similar calcified lesions are identified. There is patchy small vessel disease in the centra semiovale bilaterally, stable. There is no new gray-white compartment lesion. No acute infarct apparent. Mastoid air cells are clear. The bony calvarium appears intact.  IMPRESSION: Mild atrophy with patchy supratentorial small vessel disease, stable. Small calcified meningiomas without surrounding edema are stable and are not felt to have clinical significance. No mass effect, edema, or hemorrhage apparent. No appreciable change from prior studies. Note that the syrinx in the upper cervical region seen on recent MR is not appreciable on CT examination.   Electronically Signed   By: Lowella Grip III M.D.   On: 04/18/2015 14:16     EKG Interpretation None      MDM   Final diagnoses:  Dizziness  Dehydration    Patient presents with intermittent episodes of dizziness and headache. Also reports intermittent episodes of speech difficulty. Early asymptomatic. Last episode this morning. Is on Eliquis as an outpatient. Neurologic exam is completely normal. No evidence of cerebellar dysfunction. Patient has fluent speech. Patient was seen and evaluated for similar symptoms in February and had a MRI at that time which was reassuring. Patient noted to be mildly orthostatic with drop in blood pressure upon standing and symptoms of dizziness. Patient given fluids and reports improvement. Patient was also given Valium which she thinks helped. While TIA is a consideration, suspect mild dehydration and/or peripheral vertigo as the cause of the patient's symptoms and have low suspicion for TIA.   Patient does have carotid Dopplers which showed mild disease back in 2013. She is on anticoagulation for stroke prevention. CT negative. We'll not obtain an MRI at this time given that she had an MRI for the  same indication in February. Discussed with patient follow-up with her primary physician who can refer her to neurology if she continues to have persistent symptoms. Patient is okay with this plan and states understanding.  After history, exam, and medical workup I feel the patient has been appropriately medically screened and is safe for discharge home. Pertinent diagnoses were discussed with the patient. Patient was given return precautions.     Merryl Hacker, MD 04/19/15 1452

## 2015-04-18 NOTE — Discharge Instructions (Signed)
You were seen today for dizziness. He were found to be slightly dehydrated. Your workup is largely unremarkable. You need follow-up with your primary physician. He will be given a short course of Valium if your dizziness returns. You need to follow-up with your primary physician if symptoms persist.  Dehydration Dehydration is when you lose more fluids from the body than you take in. Vital organs such as the kidneys, brain, and heart cannot function without a proper amount of fluids and salt. Any loss of fluids from the body can cause dehydration.  Older adults are at a higher risk of dehydration than younger adults. As we age, our bodies are less able to conserve water and do not respond to temperature changes as well. Also, older adults do not become thirsty as easily or quickly. Because of this, older adults often do not realize they need to increase fluids to avoid dehydration.  CAUSES   Vomiting.  Diarrhea.  Excessive sweating.  Excessive urination.  Fever.  Certain medicines, such as blood pressure medicines called diuretics.  Poorly controlled blood sugars. SIGNS AND SYMPTOMS  Mild dehydration:  Thirst.  Dry lips.  Slightly dry mouth. Moderate dehydration:  Very dry mouth.  Sunken eyes.  Skin does not bounce back quickly when lightly pinched and released.  Dark urine and decreased urine production.  Decreased tear production.  Headache. Severe dehydration:  Very dry mouth.  Extreme thirst.  Rapid, weak pulse (more than 100 beats per minute at rest).  Cold hands and feet.  Not able to sweat in spite of heat.  Rapid breathing.  Blue lips.  Confusion and lethargy.  Difficulty being awakened.  Minimal urine production.  No tears. DIAGNOSIS  Your health care provider will diagnose dehydration based on your symptoms and your exam. Blood and urine tests will help confirm the diagnosis. The diagnostic evaluation should also identify the cause of  dehydration. TREATMENT  Treatment of mild or moderate dehydration can often be done at home by increasing the amount of fluids that you drink. It is best to drink small amounts of fluid more often. Drinking too much at one time can make vomiting worse. Severe dehydration needs to be treated at the hospital. You may be given IV fluids that contain water and electrolytes. HOME CARE INSTRUCTIONS   Ask your health care provider about specific rehydration instructions.  Drink enough fluids to keep your urine clear or pale yellow.  Drink small amounts frequently if you have nausea and vomiting.  Eat as you normally do.  Avoid:  Foods or drinks high in sugar.  Carbonated drinks.  Juice.  Extremely hot or cold fluids.  Drinks with caffeine.  Fatty, greasy foods.  Alcohol.  Tobacco.  Overeating.  Gelatin desserts.  Wash your hands well to avoid spreading bacteria and viruses.  Only take over-the-counter or prescription medicines for pain, discomfort, or fever as directed by your health care provider.  Ask your health care provider if you should continue all prescribed and over-the-counter medicines.  Keep all follow-up appointments with your health care provider. SEEK MEDICAL CARE IF:  You have abdominal pain, and it increases or stays in one area (localizes).  You have a rash, stiff neck, or severe headache.  You are irritable, sleepy, or difficult to awaken.  You are weak, dizzy, or extremely thirsty.  You have a fever. SEEK IMMEDIATE MEDICAL CARE IF:   You are unable to keep fluids down, or you get worse despite treatment.  You have frequent episodes  of vomiting or diarrhea.  You have blood or green matter (bile) in your vomit.  You have blood in your stool, or your stool looks black and tarry.  You have not urinated in 6-8 hours, or you have only urinated a small amount of very dark urine.  You faint. MAKE SURE YOU:   Understand these  instructions.  Will watch your condition.  Will get help right away if you are not doing well or get worse. Document Released: 02/29/2004 Document Revised: 12/14/2013 Document Reviewed: 08/16/2013 [facility] Patient Information 2015 [place], [place]. This information is not intended to replace advice given to you by your health care provider. Make sure you discuss any questions you have with your health care provider. Dizziness Dizziness is a common problem. It is a feeling of unsteadiness or light-headedness. You may feel like you are about to faint. Dizziness can lead to injury if you stumble or fall. A person of any age group can suffer from dizziness, but dizziness is more common in older adults. CAUSES  Dizziness can be caused by many different things, including:  Middle ear problems.  Standing for too long.  Infections.  An allergic reaction.  Aging.  An emotional response to something, such as the sight of blood.  Side effects of medicines.  Tiredness.  Problems with circulation or blood pressure.  Excessive use of alcohol or medicines, or illegal drug use.  Breathing too fast (hyperventilation).  An irregular heart rhythm (arrhythmia).  A low red blood cell count (anemia).  Pregnancy.  Vomiting, diarrhea, fever, or other illnesses that cause body fluid loss (dehydration).  Diseases or conditions such as Parkinson's disease, high blood pressure (hypertension), diabetes, and thyroid problems.  Exposure to extreme heat. DIAGNOSIS  Your health care provider will ask about your symptoms, perform a physical exam, and perform an electrocardiogram (ECG) to record the electrical activity of your heart. Your health care provider may also perform other heart or blood tests to determine the cause of your dizziness. These may include:  Transthoracic echocardiogram (TTE). During echocardiography, sound waves are used to evaluate how blood flows through your  heart.  Transesophageal echocardiogram (TEE).  Cardiac monitoring. This allows your health care provider to monitor your heart rate and rhythm in real time.  Holter monitor. This is a portable device that records your heartbeat and can help diagnose heart arrhythmias. It allows your health care provider to track your heart activity for several days if needed.  Stress tests by exercise or by giving medicine that makes the heart beat faster. TREATMENT  Treatment of dizziness depends on the cause of your symptoms and can vary greatly. HOME CARE INSTRUCTIONS   Drink enough fluids to keep your urine clear or pale yellow. This is especially important in very hot weather. In older adults, it is also important in cold weather.  Take your medicine exactly as directed if your dizziness is caused by medicines. When taking blood pressure medicines, it is especially important to get up slowly.  Rise slowly from chairs and steady yourself until you feel okay.  In the morning, first sit up on the side of the bed. When you feel okay, stand slowly while holding onto something until you know your balance is fine.  Move your legs often if you need to stand in one place for a long time. Tighten and relax your muscles in your legs while standing.  Have someone stay with you for 1-2 days if dizziness continues to be a problem. Do  this until you feel you are well enough to stay alone. Have the person call your health care provider if he or she notices changes in you that are concerning.  Do not drive or use heavy machinery if you feel dizzy.  Do not drink alcohol. SEEK IMMEDIATE MEDICAL CARE IF:   Your dizziness or light-headedness gets worse.  You feel nauseous or vomit.  You have problems talking, walking, or using your arms, hands, or legs.  You feel weak.  You are not thinking clearly or you have trouble forming sentences. It may take a friend or family member to notice this.  You have chest  pain, abdominal pain, shortness of breath, or sweating.  Your vision changes.  You notice any bleeding.  You have side effects from medicine that seems to be getting worse rather than better. MAKE SURE YOU:   Understand these instructions.  Will watch your condition.  Will get help right away if you are not doing well or get worse. Document Released: 06/04/2001 Document Revised: 12/14/2013 Document Reviewed: 06/28/2011 [facility] Patient Information 2015 [place], [place]. This information is not intended to replace advice given to you by your health care provider. Make sure you discuss any questions you have with your health care provider.

## 2015-04-18 NOTE — ED Notes (Signed)
Pt said CBG was low for her.  Pt given some OJ.

## 2015-04-18 NOTE — ED Notes (Signed)
Bed: VK18 Expected date:  Expected time:  Means of arrival:  Comments: For hall D

## 2015-04-18 NOTE — Telephone Encounter (Signed)
Can you keep her in mind and call in the next few days to check on her.  Just concerned she is a very nice lady.

## 2015-04-24 ENCOUNTER — Encounter: Payer: Self-pay | Admitting: Nurse Practitioner

## 2015-04-24 ENCOUNTER — Ambulatory Visit (INDEPENDENT_AMBULATORY_CARE_PROVIDER_SITE_OTHER): Payer: Medicare Other | Admitting: Nurse Practitioner

## 2015-04-24 VITALS — BP 110/66 | HR 64 | Ht 65.25 in | Wt 135.0 lb

## 2015-04-24 DIAGNOSIS — Z124 Encounter for screening for malignant neoplasm of cervix: Secondary | ICD-10-CM | POA: Diagnosis not present

## 2015-04-24 DIAGNOSIS — Z01419 Encounter for gynecological examination (general) (routine) without abnormal findings: Secondary | ICD-10-CM

## 2015-04-24 NOTE — Progress Notes (Signed)
Patient ID: Donna Owens, female   DOB: [birth year], 81 y.o.   MRN: [id number] 81 y.o. [id number] Divorced  African American Fe here for annual exam.  She has had recent health problems with dizziness and light headed.  Recent hospital stay.  She lives alone and finds that she is too tired or weak to stand and prepare meals.  Since her last visit here she has lost weight.  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  She is not working in the same area because she was staying cold all the time.  She gets great joy at helping others.  No LMP recorded. Patient is postmenopausal.          Sexually active: No.  The current method of family planning is abstinence and post menopausal status.    Exercising: Yes.    physical therapy on legs and back to help with stability and lifting following Cardioversion Smoker:  no  Health Maintenance: Pap:  04/24/10, negative  MMG:  03/31/15, Bi-Rads 1:  Negative Colonoscopy:  05/2013 few polyps, repeat within a year to remove polyps BMD:   02/2011 Spine -0.2/ left hip neck -1.5/ left total -0.3 (PCP follows), 02/15/15, Dr. Philip Aspen, has not heard results TDaP:  PCP Labs: PCP and ED visit 04/18/15   reports that she has never smoked. She has never used smokeless tobacco. She reports that she does not drink alcohol or use illicit drugs.  Past Medical History  Diagnosis Date  . Diabetes mellitus   . Hypertension   . Cancer   . Hyperlipemia   . Pulmonary hypertension     moderate echo 11/07/10  . Carotid arterial disease 12/13/11     mild by doppler  . PVD (peripheral vascular disease)   . OSA (obstructive sleep apnea)   . Anemia   . Atrophic vaginitis   . Sleep apnea   . Osteoporosis   . CVA (cerebral vascular accident)   . Blood transfusion without reported diagnosis   . OSA on CPAP 06/20/2014    Past Surgical History  Procedure Laterality Date  . Breast surgery      right 07/26/2000  . Breast lumpectomy Right     chemo/radiation  .  Breast lumpectomy Left     benign  . Colonoscopy  2010  . Ingrown toenails      removed in 03/2013  . Cardioversion N/A 07/21/2014    Procedure: CARDIOVERSION;  Surgeon: Sanda Klein, MD;  Location: [facility];  Service: Cardiovascular;  Laterality: N/A;    Current Outpatient Prescriptions  Medication Sig Dispense Refill  . acetaminophen (TYLENOL) 500 MG tablet Take 500 mg by mouth every 6 (six) hours as needed for moderate pain.    Marland Kitchen apixaban (ELIQUIS) 5 MG TABS tablet Take 1 tablet (5 mg total) by mouth 2 (two) times daily. 60 tablet 3  . calcium-vitamin D (OSCAL WITH D) 500-200 MG-UNIT per tablet Take 1 tablet by mouth daily with breakfast.     . carvedilol (COREG) 25 MG tablet Take 25 mg by mouth 2 (two) times daily with a meal.    . Cholecalciferol (VITAMIN D-3) 1000 UNITS CAPS Take 1 capsule by mouth daily.    . Coenzyme Q10 (CO Q 10) 100 MG CAPS Take 1 capsule by mouth daily.    . diazepam (VALIUM) 5 MG tablet Take 1 tablet (5 mg total) by mouth every 8 (eight) hours as needed (dizziness). 5 tablet 0  . hydrOXYzine (ATARAX/VISTARIL) 25  MG tablet Take 25 mg by mouth 3 (three) times daily as needed for itching.    Marland Kitchen lisinopril (PRINIVIL,ZESTRIL) 20 MG tablet Take 2 tablets (40 mg total) by mouth daily. (Patient taking differently: Take 20 mg by mouth 2 (two) times daily. ) 180 tablet 3  . metFORMIN (GLUCOPHAGE-XR) 500 MG 24 hr tablet Take 500 mg by mouth 2 (two) times daily.    . multivitamin (METANX) 3-35-2 MG TABS tablet Take 1 tablet by mouth 2 (two) times daily. 60 tablet 11  . NIFEdipine (NIFEDICAL XL) 30 MG 24 hr tablet Take 1 tablet (30 mg total) by mouth daily. 90 tablet 1  . Polyethyl Glycol-Propyl Glycol (SYSTANE OP) Apply 1 drop to eye 2 (two) times daily. SYSTANE    . pravastatin (PRAVACHOL) 40 MG tablet Take 40 mg by mouth daily.    . promethazine (PHENERGAN) 25 MG tablet Take 25 mg by mouth daily. nausea    . triamcinolone cream (KENALOG) 0.1 % Apply 1 application  topically 2 (two) times daily. (Patient taking differently: Apply 1 application topically 2 (two) times daily as needed (rash/sore). On left foot.) 30 g 0  . vitamin B-12 (CYANOCOBALAMIN) 1000 MCG tablet Take 1,000 mcg by mouth 2 (two) times daily.      No current facility-administered medications for this visit.    Family History  Problem Relation Age of Onset  . Diabetes Other   . Diabetes Brother   . Hypertension Brother   . Heart failure Daughter 40    ROS:  Pertinent items are noted in HPI.  Otherwise, a comprehensive ROS was negative.  Exam:   BP 110/66 mmHg  Pulse 64  Ht 5' 5.25" (1.657 m)  Wt 135 lb (61.236 kg)  BMI 22.30 kg/m2 Height: 5' 5.25" (165.7 cm) Ht Readings from Last 3 Encounters:  04/24/15 5' 5.25" (1.657 m)  10/20/14 5\' 4"  (1.626 m)  08/05/14 5\' 4"  (1.626 m)    General appearance: alert, cooperative and appears stated age Head: Normocephalic, without obvious abnormality, atraumatic Neck: no adenopathy, supple, symmetrical, trachea midline and thyroid normal to inspection and palpation Lungs: clear to auscultation bilaterally Breasts: normal appearance, no masses or tenderness, with surgical and radiation changes on the right. Heart: regular rate and rhythm Abdomen: soft, non-tender; no masses,  no organomegaly Extremities: extremities normal, atraumatic, no cyanosis or edema Skin: Skin color, texture, turgor normal. No rashes or lesions Lymph nodes: Cervical, supraclavicular, and axillary nodes normal. No abnormal inguinal nodes palpated Neurologic: Grossly normal   Pelvic: per request pelvic not done as she is not feeling well.                Chaperone present: no  A:  Well Woman with normal exam  S/P Right breast cancer 2001 with chemo and radiation S/P Lumpectomy left 1979 S/P Biopsy Left 01/2013   P:   Reviewed health and wellness pertinent to exam  Pap smear not taken today  Mammogram is due 4/17  Continue to  exercise caution at home and not fall  Ask her to contact other family members when she needs help or to prepare her a meal  Support is given  Counseled on breast self exam, mammography screening, adequate intake of calcium and vitamin D, diet and exercise, Kegel's exercises return annually or prn  An After Visit Summary was printed and given to the patient.

## 2015-04-24 NOTE — Patient Instructions (Signed)

## 2015-04-24 NOTE — Telephone Encounter (Signed)
Left message to call Kaitlyn at [phone]. 

## 2015-04-26 NOTE — Telephone Encounter (Signed)
Patient was seen for annual exam on 04/24/2015 with Donna Owens, [place].  Routing to provider for final review. Patient agreeable to disposition. Patient aware MD will review message and nurse will return call with any additional instructions or change of disposition. Will close encounter.

## 2015-04-30 NOTE — Progress Notes (Signed)
Encounter reviewed by Dr. Patton Swisher Silva.  

## 2015-05-03 DIAGNOSIS — C50912 Malignant neoplasm of unspecified site of left female breast: Secondary | ICD-10-CM | POA: Diagnosis not present

## 2015-05-03 DIAGNOSIS — I739 Peripheral vascular disease, unspecified: Secondary | ICD-10-CM | POA: Diagnosis not present

## 2015-05-03 DIAGNOSIS — I10 Essential (primary) hypertension: Secondary | ICD-10-CM | POA: Diagnosis not present

## 2015-05-03 DIAGNOSIS — I4892 Unspecified atrial flutter: Secondary | ICD-10-CM | POA: Diagnosis not present

## 2015-05-03 DIAGNOSIS — R269 Unspecified abnormalities of gait and mobility: Secondary | ICD-10-CM | POA: Diagnosis not present

## 2015-05-03 DIAGNOSIS — Z6822 Body mass index (BMI) 22.0-22.9, adult: Secondary | ICD-10-CM | POA: Diagnosis not present

## 2015-05-03 DIAGNOSIS — E1151 Type 2 diabetes mellitus with diabetic peripheral angiopathy without gangrene: Secondary | ICD-10-CM | POA: Diagnosis not present

## 2015-05-03 DIAGNOSIS — R600 Localized edema: Secondary | ICD-10-CM | POA: Diagnosis not present

## 2015-05-03 DIAGNOSIS — R51 Headache: Secondary | ICD-10-CM | POA: Diagnosis not present

## 2015-05-03 DIAGNOSIS — N183 Chronic kidney disease, stage 3 (moderate): Secondary | ICD-10-CM | POA: Diagnosis not present

## 2015-05-10 ENCOUNTER — Other Ambulatory Visit: Payer: Self-pay | Admitting: Cardiovascular Disease

## 2015-05-10 NOTE — Telephone Encounter (Signed)
Rx(s) sent to pharmacy electronically.  

## 2015-05-18 ENCOUNTER — Ambulatory Visit: Payer: Medicare Other | Admitting: Nurse Practitioner

## 2015-06-05 ENCOUNTER — Encounter: Payer: Self-pay | Admitting: Cardiovascular Disease

## 2015-06-05 ENCOUNTER — Ambulatory Visit (INDEPENDENT_AMBULATORY_CARE_PROVIDER_SITE_OTHER): Payer: Medicare Other | Admitting: Cardiovascular Disease

## 2015-06-05 VITALS — BP 140/64 | HR 70 | Ht 65.0 in | Wt 134.9 lb

## 2015-06-05 DIAGNOSIS — E785 Hyperlipidemia, unspecified: Secondary | ICD-10-CM

## 2015-06-05 DIAGNOSIS — R0789 Other chest pain: Secondary | ICD-10-CM

## 2015-06-05 DIAGNOSIS — I10 Essential (primary) hypertension: Secondary | ICD-10-CM

## 2015-06-05 DIAGNOSIS — R296 Repeated falls: Secondary | ICD-10-CM | POA: Diagnosis not present

## 2015-06-05 DIAGNOSIS — Z7901 Long term (current) use of anticoagulants: Secondary | ICD-10-CM

## 2015-06-05 MED ORDER — APIXABAN 5 MG PO TABS: 5.0000 mg | ORAL_TABLET | Freq: Two times a day (BID) | ORAL | Status: DC

## 2015-06-05 MED ORDER — ASPIRIN EC 81 MG PO TBEC: 81.0000 mg | DELAYED_RELEASE_TABLET | Freq: Every day | ORAL | Status: DC

## 2015-06-05 NOTE — Progress Notes (Signed)
Patient ID: Donna Owens, female   DOB: March 26, 1928, 81 y.o.   MRN: [id number]     Cardiology Office Note   Date:  06/06/2015   ID:  Donna Owens, DOB 10-23-1928, MRN [id number]  PCP:  Donna Lopes, MD  Cardiologist:   Sanda Klein, MD   Chief Complaint  Patient presents with  . Follow-up    States she has been falling due to tripping over her feet - did not go to the ER most recent fall which was Sunday.  No SOB. Occas. chest tightness.  Mild ankle edema.      History of Present Illness: Donna Owens is a 81 y.o. 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 She also has hypertension, diabetes mellitus and hyperlipidemia.   Roughly one year ago she had atrial flutter with slow ventricular response and underwent cardioversion and she has been on chronic anticoagulation with apixaban ever since and has not had any bleeding complications. Has been no documented recurrence of her arrhythmia since then She does not have a history of stroke or other embolic events. CHADSVasc 6. Unfortunately she has recently had several falls, many of which had led to emergency room evaluations. Recently she fell at home and hit her head but did not have any bleeding. She did not go to the emergency room for that event. She is not sure what she is falling but has very unsteady gait. She has given up driving. She denies frank vertigo and has not experienced syncope. She describes herself as "tripping over her own feet". An MRI in February for dizziness and intermittent headache and difficulty speaking was negative.  She has had mild ankle swelling but denies angina or dyspnea. She has lost a lot of weight and does not know why. Hours function is normal.  She has preserved left ventricular systolic function and moderate LVH by echo. She had  moderate tricuspid regurgitation, moderate pulmonary hypertension and a moderately dilated right atrium with preserved right ventricular function. Right heart enlargement and pulmonary hypertension are possibly related to obstructive sleep apnea which has been intermittently treated with CPAP since 2011. She has a previous history of right breast lumpectomy with chemotherapy and radiation.  Past Medical History  Diagnosis Date  . Diabetes mellitus   . Hypertension   . Cancer   . Hyperlipemia   . Pulmonary hypertension     moderate echo 11/07/10  . Carotid arterial disease 12/13/11     mild by doppler  . PVD (peripheral vascular disease)   . OSA (obstructive sleep apnea)   . Anemia   . Atrophic vaginitis   . Sleep apnea   . Osteoporosis   . CVA (cerebral vascular accident)   . Blood transfusion without reported diagnosis   . OSA on CPAP 06/20/2014    Past Surgical History  Procedure Laterality Date  . Breast surgery      right 07/26/2000  . Breast lumpectomy Right     chemo/radiation  . Breast lumpectomy Left     benign  . Colonoscopy  2010  . Ingrown toenails      removed in 03/2013  . Cardioversion N/A 07/21/2014    Procedure: CARDIOVERSION;  Surgeon: Sanda Klein, MD;  Location: [facility];  Service: Cardiovascular;  Laterality: N/A;     Current Outpatient Prescriptions  Medication Sig Dispense Refill  .  acetaminophen (TYLENOL) 500 MG tablet Take 500 mg by mouth every 6 (six) hours as needed for moderate pain.    Marland Kitchen apixaban (ELIQUIS) 5 MG TABS tablet Take 1 tablet (5 mg total) by mouth 2 (two) times daily. HOLD UNTIL INSTRUCTED TO RESTART 60 tablet 3  . calcium-vitamin D (OSCAL WITH D) 500-200 MG-UNIT per tablet Take 1 tablet by mouth daily with breakfast.     . carvedilol (COREG) 25 MG tablet Take 25 mg by mouth 2 (two) times daily with a meal.    . Cholecalciferol (VITAMIN D-3) 1000 UNITS CAPS Take 1 capsule by mouth daily.    . Coenzyme Q10 (CO Q 10) 100 MG CAPS Take  1 capsule by mouth daily.    . diazepam (VALIUM) 5 MG tablet Take 1 tablet (5 mg total) by mouth every 8 (eight) hours as needed (dizziness). 5 tablet 0  . hydrOXYzine (ATARAX/VISTARIL) 25 MG tablet Take 25 mg by mouth 3 (three) times daily as needed for itching.    Marland Kitchen lisinopril (PRINIVIL,ZESTRIL) 20 MG tablet Take 2 tablets (40 mg total) by mouth daily. (Patient taking differently: Take 20 mg by mouth 2 (two) times daily. ) 180 tablet 3  . metFORMIN (GLUCOPHAGE-XR) 500 MG 24 hr tablet Take 500 mg by mouth 2 (two) times daily.    . multivitamin (METANX) 3-35-2 MG TABS tablet Take 1 tablet by mouth 2 (two) times daily. 60 tablet 11  . NIFEdipine (NIFEDICAL XL) 30 MG 24 hr tablet Take 1 tablet (30 mg total) by mouth daily. 90 tablet 1  . Polyethyl Glycol-Propyl Glycol (SYSTANE OP) Apply 1 drop to eye 2 (two) times daily. SYSTANE    . pravastatin (PRAVACHOL) 40 MG tablet Take 40 mg by mouth daily.    . promethazine (PHENERGAN) 25 MG tablet Take 25 mg by mouth daily. nausea    . topiramate (TOPAMAX) 25 MG tablet Take 25 mg by mouth daily.  12  . triamcinolone cream (KENALOG) 0.1 % Apply 1 application topically 2 (two) times daily. (Patient taking differently: Apply 1 application topically 2 (two) times daily as needed (rash/sore). On left foot.) 30 g 0  . vitamin B-12 (CYANOCOBALAMIN) 1000 MCG tablet Take 1,000 mcg by mouth 2 (two) times daily.     Marland Kitchen aspirin EC 81 MG tablet Take 1 tablet (81 mg total) by mouth daily. 30 tablet 11   No current facility-administered medications for this visit.    Allergies:   Review of patient's allergies indicates no known allergies.    Social History:  The patient  reports that she has never smoked. She has never used smokeless tobacco. She reports that she does not drink alcohol or use illicit drugs.   Family History:  The patient's family history includes Diabetes in her brother and other; Heart failure (age of onset: 69) in her daughter; Hypertension in her  brother.    ROS:  Please see the history of present illness.    Otherwise, review of systems positive for none.   All other systems are reviewed and negative.    PHYSICAL EXAM: VS:  BP 140/64 mmHg  Pulse 70  Ht 5\' 5"  (1.651 m)  Wt 134 lb 14.4 oz (61.19 kg)  BMI 22.45 kg/m2 , BMI Body mass index is 22.45 kg/(m^2). Orthostatic vital signs checked today Lying 130/66, heart rate 61 Sitting 134/72, heart rate 66 Standing 129/66, heart rate 68 After 3 minutes 132/65, heart rate 66; feeling unsteady while upright  General: Alert, oriented x3, no  distress Head: no evidence of trauma, PERRL, EOMI, no exophtalmos or lid lag, no myxedema, no xanthelasma; normal ears, nose and oropharynx Neck: normal jugular venous pulsations and no hepatojugular reflux; brisk carotid pulses without delay and no carotid bruits Chest: clear to auscultation, no signs of consolidation by percussion or palpation, normal fremitus, symmetrical and full respiratory excursions Cardiovascular: normal position and quality of the apical impulse, regular rhythm, normal first and second heart sounds, no murmurs, rubs or gallops Abdomen: no tenderness or distention, no masses by palpation, no abnormal pulsatility or arterial bruits, normal bowel sounds, no hepatosplenomegaly Extremities: no clubbing, cyanosis or edema; 2+ radial, ulnar and brachial pulses bilaterally; 2+ right femoral, posterior tibial and dorsalis pedis pulses; 2+ left femoral, posterior tibial and dorsalis pedis pulses; no subclavian or femoral bruits Neurological: grossly nonfocal Psych: euthymic mood, full affect   EKG:  EKG is ordered today. The ekg ordered today demonstrates sinus rhythm, QS pattern in leads V1-V3, QTC 378 ms   Recent Labs: 06/29/2014: ALT 11; TSH 1.723 04/18/2015: BUN 28*; Creatinine, Ser 1.19*; Hemoglobin 11.6*; Platelets 186; Potassium 4.0; Sodium 139    Lipid Panel    Component Value Date/Time   CHOL 139 02/02/2014 1106    TRIG 47 02/02/2014 1106   HDL 54 02/02/2014 1106   CHOLHDL 2.6 02/02/2014 1106   VLDL 9 02/02/2014 1106   LDLCALC 76 02/02/2014 1106      Wt Readings from Last 3 Encounters:  06/05/15 134 lb 14.4 oz (61.19 kg)  04/24/15 135 lb (61.236 kg)  10/20/14 136 lb 11.2 oz (62.007 kg)      ASSESSMENT AND PLAN:  PAD (peripheral artery disease) She does not have intestinal angina but I wonder whether her celiac artery stenosis could be playing a role in her unexplained weight loss. Abdominal aortic ultrasound did not show any evidence that her CT described visceral artery stenoses are significant from a hemodynamic standpoint. There was no evidence of stenosis across the celiac artery or renal arteries.   Atrial flutter In normal rhythm today and no symptoms of recurrent palpitations since her cardioversion. I'm very concerned about the high risk of intracranial hemorrhage with frequent falls and head injury. Currently does not appear to have any evidence of intracranial hemorrhage. I believe that at least temporarily we should discontinue her anticoagulation until the cause of her falls is identified and addressed. Will stop the anti-coagulum and and start aspirin 81 mg daily. I understand she will get a walker soon. I think this is a good first step, I have also recommended she have an official neurology evaluation.  Dyslipidemia Excellent lipid profile.  Unsteady gait and falls No clear explanation. She does not have orthostatic hypotension. Blood pressure control is in the desirable range/high normal.  Current medicines are reviewed at length with the patient today.  The patient does not have concerns regarding medicines.  The following changes have been made:  Stop eliquis  Labs/ tests ordered today include:  Orders Placed This Encounter  Procedures  . Ambulatory referral to Neurology  . EKG 12-Lead    Patient Instructions  You have been referred to neurology to assess your frequent  falls.  Stop the Eliquis for now until it is determined why you are having falls.  Start Aspirin while off of Eliquis 81mg  daily.  Dr. Sallyanne Kuster recommends that you schedule a follow-up appointment in: 3 months.        Mikael Spray, MD  06/06/2015 11:07 AM    Aritza Brunet,  MD, [facility] [phone] office [phone] pager

## 2015-06-05 NOTE — Patient Instructions (Addendum)
You have been referred to neurology to assess your frequent falls.  Stop the Eliquis for now until it is determined why you are having falls.  Start Aspirin while off of Eliquis 81mg  daily.  Dr. Sallyanne Kuster recommends that you schedule a follow-up appointment in: 3 months.

## 2015-06-16 ENCOUNTER — Telehealth: Payer: Self-pay | Admitting: Cardiovascular Disease

## 2015-06-16 NOTE — Telephone Encounter (Signed)
Pt called in stating that Dr. Loletha Grayer was going to refer her a neurologist and she has not heard from the office for an appt as of yet. Please call back  Thanks

## 2015-06-19 ENCOUNTER — Telehealth: Payer: Self-pay | Admitting: Neurology

## 2015-06-19 DIAGNOSIS — E1151 Type 2 diabetes mellitus with diabetic peripheral angiopathy without gangrene: Secondary | ICD-10-CM | POA: Diagnosis not present

## 2015-06-19 DIAGNOSIS — Z6822 Body mass index (BMI) 22.0-22.9, adult: Secondary | ICD-10-CM | POA: Diagnosis not present

## 2015-06-19 DIAGNOSIS — L299 Pruritus, unspecified: Secondary | ICD-10-CM | POA: Diagnosis not present

## 2015-06-19 DIAGNOSIS — R269 Unspecified abnormalities of gait and mobility: Secondary | ICD-10-CM | POA: Diagnosis not present

## 2015-06-19 DIAGNOSIS — I4892 Unspecified atrial flutter: Secondary | ICD-10-CM | POA: Diagnosis not present

## 2015-06-19 DIAGNOSIS — R030 Elevated blood-pressure reading, without diagnosis of hypertension: Secondary | ICD-10-CM | POA: Diagnosis not present

## 2015-06-19 NOTE — Telephone Encounter (Signed)
Called pt back and advised her that if her mom has fallen and hit her head, she needs to see her PCP as soon as possible, and if she is experiencing symptoms to go to the ER, and not wait for a month to see Dr. Brett Fairy. Pt's daughter said she was going to call Dr. Sharlett Iles and get an appt and call us back to made an appt with Dr. Brett Fairy.

## 2015-06-19 NOTE — Telephone Encounter (Signed)
Patient's daughter [name] is calling as she states her mother needs to see the doctor sooner than 7/28 as she has fallen and hit head.  Could you please work her mother in.  Thanks!

## 2015-06-20 ENCOUNTER — Telehealth: Payer: Self-pay | Admitting: *Deleted

## 2015-06-20 DIAGNOSIS — H26493 Other secondary cataract, bilateral: Secondary | ICD-10-CM | POA: Diagnosis not present

## 2015-06-20 DIAGNOSIS — H538 Other visual disturbances: Secondary | ICD-10-CM | POA: Diagnosis not present

## 2015-06-20 NOTE — Telephone Encounter (Signed)
Called pt and asked her to come at 12:50 instead so Dr. Jaynee Eagles would have enough time with her since she is new to her. Pt verbalized this would be okay.

## 2015-06-20 NOTE — Telephone Encounter (Signed)
Can this encounter be closed?

## 2015-06-21 ENCOUNTER — Ambulatory Visit (INDEPENDENT_AMBULATORY_CARE_PROVIDER_SITE_OTHER): Payer: Medicare Other | Admitting: Neurology

## 2015-06-21 ENCOUNTER — Telehealth: Payer: Self-pay | Admitting: Neurology

## 2015-06-21 ENCOUNTER — Encounter: Payer: Self-pay | Admitting: Neurology

## 2015-06-21 ENCOUNTER — Other Ambulatory Visit: Payer: Self-pay | Admitting: Neurology

## 2015-06-21 VITALS — BP 96/50 | HR 64 | Temp 98.1°F | Ht 65.0 in | Wt 141.6 lb

## 2015-06-21 DIAGNOSIS — F05 Delirium due to known physiological condition: Secondary | ICD-10-CM

## 2015-06-21 DIAGNOSIS — H538 Other visual disturbances: Secondary | ICD-10-CM

## 2015-06-21 DIAGNOSIS — R413 Other amnesia: Secondary | ICD-10-CM | POA: Diagnosis not present

## 2015-06-21 DIAGNOSIS — W19XXXA Unspecified fall, initial encounter: Secondary | ICD-10-CM | POA: Diagnosis not present

## 2015-06-21 DIAGNOSIS — R270 Ataxia, unspecified: Secondary | ICD-10-CM | POA: Diagnosis not present

## 2015-06-21 DIAGNOSIS — N3000 Acute cystitis without hematuria: Secondary | ICD-10-CM | POA: Diagnosis not present

## 2015-06-21 DIAGNOSIS — R42 Dizziness and giddiness: Secondary | ICD-10-CM

## 2015-06-21 DIAGNOSIS — R410 Disorientation, unspecified: Secondary | ICD-10-CM

## 2015-06-21 LAB — URINALYSIS, ROUTINE W REFLEX MICROSCOPIC
BILIRUBIN UA: NEGATIVE
Glucose, UA: NEGATIVE
NITRITE UA: NEGATIVE
Protein, UA: NEGATIVE
RBC, UA: NEGATIVE
SPEC GRAV UA: 1.025 (ref 1.005–1.030)
UUROB: 0.2 mg/dL (ref 0.2–1.0)
pH, UA: 6 (ref 5.0–7.5)

## 2015-06-21 LAB — COMPREHENSIVE METABOLIC PANEL
ALT: 20 IU/L (ref 0–32)
AST: 21 IU/L (ref 0–40)
Albumin/Globulin Ratio: 1.6 (ref 1.1–2.5)
Albumin: 4.1 g/dL (ref 3.5–4.7)
Alkaline Phosphatase: 51 IU/L (ref 39–117)
BUN/Creatinine Ratio: 21 (ref 11–26)
BUN: 33 mg/dL — ABNORMAL HIGH (ref 8–27)
Bilirubin Total: 0.3 mg/dL (ref 0.0–1.2)
CALCIUM: 9.6 mg/dL (ref 8.7–10.3)
CHLORIDE: 105 mmol/L (ref 96–108)
CO2: 27 mmol/L (ref 18–29)
Creatinine, Ser: 1.55 mg/dL — ABNORMAL HIGH (ref 0.57–1.00)
GFR calc Af Amer: 35 mL/min/{1.73_m2} — ABNORMAL LOW (ref >59)
GFR calc non Af Amer: 30 mL/min/{1.73_m2} — ABNORMAL LOW (ref >59)
GLUCOSE: 141 mg/dL — AB (ref 65–99)
Globulin, Total: 2.6 g/dL (ref 1.5–4.5)
POTASSIUM: 4.8 mmol/L (ref 3.5–5.2)
Sodium: 139 mmol/L (ref 134–144)
TOTAL PROTEIN: 6.7 g/dL (ref 6.0–8.5)

## 2015-06-21 LAB — CBC
Hematocrit: 28.6 % — ABNORMAL LOW (ref 34.0–46.6)
Hemoglobin: 10 g/dL — ABNORMAL LOW (ref 11.1–15.9)
MCH: 30.7 pg (ref 26.6–33.0)
MCHC: 35 g/dL (ref 31.5–35.7)
MCV: 88 fL (ref 79–97)
PLATELETS: 160 10*3/uL (ref 150–379)
RBC: 3.26 x10E6/uL — AB (ref 3.77–5.28)
RDW: 13.5 % (ref 12.3–15.4)
WBC: 6.1 10*3/uL (ref 3.4–10.8)

## 2015-06-21 LAB — MICROSCOPIC EXAMINATION

## 2015-06-21 MED ORDER — SULFAMETHOXAZOLE-TRIMETHOPRIM 800-160 MG PO TABS: 1.0000 | ORAL_TABLET | Freq: Two times a day (BID) | ORAL | Status: DC

## 2015-06-21 NOTE — Progress Notes (Signed)
[facility]    Provider:  Dr Jaynee Eagles Referring Provider: Leanna Battles, MD Primary Care Physician:  [name], MD  CC:  Dizziness  HPI:  Donna Owens is a 81 y.o. female here as a referral from Dr. Philip Aspen for a fall. She has an urgent add-on today for the work in doctor because Dr. Roddie Mc is out of the office. Daughter wanted patient seen. She is accompanied by daughter today. She is lightheaded. Blood pressure this morning in the office is very low 96/50. Symptoms Started 2 months ago. She denies room spinning. She gets lightheasded like she is going to fall. She feels light headed in the morning when she gets up. She takes her blood pressure in the morning and iit is a little lower about 536 or 144 systolic. The dizziness is when she stands up and gets out of bed. No dizziness any other time during the day. She was getting up to go to the bathroom and she stood up and fell and her legs got caught up on herself and she slipped and fell. She hit her head.  she hit it on the right side of the head and tripped. She is more disoriented, going on for several weeks.     Reviewed notes, labs and imaging from outside physicians, which showed: Patient was seen in the ED in April after the fall. CT showed nothing acute (personally reviewed images). Labs at that time showed dehydration.  Review of Systems: Patient complains of symptoms per HPI as well as the following symptoms: Weight gain, fatigue, blurred vision, shortness of breath, snoring, increased thirst, incontinence, joint swelling, aching muscles, urination problems, incontinence, confusion, slurred speech, tremor, depression, decreased energy, snoring, restless legs. Pertinent negatives per HPI. All others negative.   History   Social History  . Marital Status: Divorced    Spouse Name: N/A  . Number of Children: 1  . Years of Education: BSN   Occupational History  . RETIRED    Social History Main  Topics  . Smoking status: Never Smoker   . Smokeless tobacco: Never Used  . Alcohol Use: No  . Drug Use: No  . Sexual Activity: No   Other Topics Concern  . Not on file   Social History Narrative   Patient is divorced and lives alone.   Patient has one adult daughter.   Patient is retired.   Patient has a BSN degree in nursing.   Patient is right-handed.   Patient drinks one soda per week.    Family History  Problem Relation Age of Onset  . Diabetes Other   . Diabetes Brother   . Hypertension Brother   . Heart failure Daughter 6    Past Medical History  Diagnosis Date  . Diabetes mellitus   . Hypertension   . Cancer   . Hyperlipemia   . Pulmonary hypertension     moderate echo 11/07/10  . Carotid arterial disease 12/13/11     mild by doppler  . PVD (peripheral vascular disease)   . OSA (obstructive sleep apnea)   . Anemia   . Atrophic vaginitis   . Sleep apnea   . Osteoporosis   . CVA (cerebral vascular accident)   . Blood transfusion without reported diagnosis   . OSA on CPAP 06/20/2014    Past Surgical History  Procedure Laterality Date  . Breast surgery      right 07/26/2000  . Breast lumpectomy Right     chemo/radiation  .  Breast lumpectomy Left     benign  . Colonoscopy  2010  . Ingrown toenails      removed in 03/2013  . Cardioversion N/A 07/21/2014    Procedure: CARDIOVERSION;  Surgeon: Sanda Klein, MD;  Location: [facility];  Service: Cardiovascular;  Laterality: N/A;    Current Outpatient Prescriptions  Medication Sig Dispense Refill  . acetaminophen (TYLENOL) 500 MG tablet Take 500 mg by mouth every 6 (six) hours as needed for moderate pain.    Marland Kitchen apixaban (ELIQUIS) 5 MG TABS tablet Take 1 tablet (5 mg total) by mouth 2 (two) times daily. HOLD UNTIL INSTRUCTED TO RESTART 60 tablet 3  . aspirin EC 81 MG tablet Take 1 tablet (81 mg total) by mouth daily. 30 tablet 11  . calcium-vitamin D (OSCAL WITH D) 500-200 MG-UNIT per tablet Take 1  tablet by mouth daily with breakfast.     . carvedilol (COREG) 25 MG tablet Take 25 mg by mouth 2 (two) times daily with a meal.    . Cholecalciferol (VITAMIN D-3) 1000 UNITS CAPS Take 1 capsule by mouth daily.    . Coenzyme Q10 (CO Q 10) 100 MG CAPS Take 1 capsule by mouth daily.    . diazepam (VALIUM) 5 MG tablet Take 1 tablet (5 mg total) by mouth every 8 (eight) hours as needed (dizziness). 5 tablet 0  . hydrOXYzine (ATARAX/VISTARIL) 25 MG tablet Take 25 mg by mouth 3 (three) times daily as needed for itching.    Marland Kitchen lisinopril (PRINIVIL,ZESTRIL) 20 MG tablet Take 2 tablets (40 mg total) by mouth daily. (Patient taking differently: Take 20 mg by mouth 2 (two) times daily. ) 180 tablet 3  . metFORMIN (GLUCOPHAGE-XR) 500 MG 24 hr tablet Take 500 mg by mouth 2 (two) times daily.    . multivitamin (METANX) 3-35-2 MG TABS tablet Take 1 tablet by mouth 2 (two) times daily. 60 tablet 11  . NIFEdipine (NIFEDICAL XL) 30 MG 24 hr tablet Take 1 tablet (30 mg total) by mouth daily. 90 tablet 1  . Polyethyl Glycol-Propyl Glycol (SYSTANE OP) Apply 1 drop to eye 2 (two) times daily. SYSTANE    . pravastatin (PRAVACHOL) 40 MG tablet Take 40 mg by mouth daily.    . promethazine (PHENERGAN) 25 MG tablet Take 25 mg by mouth daily. nausea    . sulfamethoxazole-trimethoprim (BACTRIM DS,SEPTRA DS) 800-160 MG per tablet Take 1 tablet by mouth 2 (two) times daily. 6 tablet 0  . topiramate (TOPAMAX) 25 MG tablet Take 25 mg by mouth daily.  12  . triamcinolone cream (KENALOG) 0.1 % Apply 1 application topically 2 (two) times daily. (Patient taking differently: Apply 1 application topically 2 (two) times daily as needed (rash/sore). On left foot.) 30 g 0  . vitamin B-12 (CYANOCOBALAMIN) 1000 MCG tablet Take 1,000 mcg by mouth 2 (two) times daily.      No current facility-administered medications for this visit.    Allergies as of 06/21/2015  . (No Known Allergies)    Vitals: BP 96/50 mmHg  Pulse 64  Temp(Src)  98.1 F (36.7 C) (Oral)  Ht 5\' 5"  (1.651 m)  Wt 141 lb 9.6 oz (64.229 kg)  BMI 23.56 kg/m2 Last Weight:  Wt Readings from Last 1 Encounters:  06/21/15 141 lb 9.6 oz (64.229 kg)   Last Height:   Ht Readings from Last 1 Encounters:  06/21/15 5\' 5"  (1.651 m)    Physical exam: Exam: Gen: NAD, conversant, well nourised,  CV: Irregular, no MRG. No Carotid Bruits. No peripheral edema, warm, nontender Eyes: Conjunctivae clear without exudates or hemorrhage  Neuro: Detailed Neurologic Exam  Speech:    Speech is normal; fluent and spontaneous with normal comprehension.  Cognition:    The patient is oriented to person, place, and time;  Cranial Nerves:    The pupils are equal, round, and reactive to light.  Visual fields are full to finger confrontation. Extraocular movements are intact. Trigeminal sensation is intact and the muscles of mastication are normal. The face is symmetric. The palate elevates in the midline. Hearing intact. Voice is normal. Shoulder shrug is normal. The tongue has normal motion without fasciculations.   Gait: Not Ataxic  Motor Observation:    No asymmetry, no atrophy, and no involuntary movements noted. Tone:    Normal muscle tone.    Strength:    Strength is symmetrical in the upper and lower limbs.      Sensation: intact to LT     Reflex Exam:     Assessment/Plan:   NIKIRA KUSHNIR is a 81 y.o. female here as a referral from Dr. Philip Aspen for a fall. She has an urgent add-on today for the work in doctor because Dr. Roddie Mc is out of the office. Daughter wanted patient seen. She is accompanied by daughter today. Patient reports 2 months of dizziness, lightheadedness, falls and recent confusion. Blood pressure this morning in the office is very low 96/50.  Home physical therapy for her gait and falls, she lives alone We'll get a stat CBC, CMP and urinalysis with reflex microscopy Discussed that her blood pressures. Lower on the office  today and she might be dehydrated again, this is likely the cause of her symptoms. Symptoms to sound orthostatic in nature. She needs to increase her fluid intake. She should follow-up Dr. Sharlett Iles Daughter's cell phone number is [phone]   Sarina Ill, MD  [facility] [address] [place], [place] [address]  Phone [phone] Fax [phone]  A total of 30 minutes was spent face-to-face with this patient. Over half this time was spent on counseling patient on the orthostatic hypotension diagnosis and different diagnostic and therapeutic options available.   Marland Kitchen

## 2015-06-21 NOTE — Telephone Encounter (Signed)
Patient was seen today for dizziness. Stat labs showed dehydration:  Creatinine 1.55 (baseline closer to 1.15), BUN   Dr. Bevelyn Buckles on [facility].

## 2015-06-21 NOTE — Patient Instructions (Signed)
Overall you are doing fairly well but I do want to suggest a few things today:   Remember to drink plenty of fluid, eat healthy meals and do not skip any meals. Try to eat protein with a every meal and eat a healthy snack such as fruit or nuts in between meals. Try to keep a regular sleep-wake schedule and try to exercise daily, particularly in the form of walking, 20-30 minutes a day, if you can.   As far as your medications are concerned, I would like to suggest: Will call Dr. Sharlett Iles about decreasing BP meds  As far as diagnostic testing: labs, MRi of the brain, Home PT  Our phone number is [phone]. We also have an after hours call service for urgent matters and there is a physician on-call for urgent questions. For any emergencies you know to call 911 or go to the nearest emergency room

## 2015-06-21 NOTE — Telephone Encounter (Signed)
Patient was seen today for dizziness. Vitals today in the office 95/50. Labs returned showed AKI with creatinine 1.55(baseline closer to 1.13) and BUN 33, UA shows 2+ WBC sterase, trace ketones, 11-30 WBCs and many bacteria. She is dehydrated with a UTI. Discussed with ptient as well as daughter. Will call in antibiotics. Patient is to go to the ED tonight if symptomatic and increase fluids. I also want her to see her pcp asap. thanks

## 2015-06-21 NOTE — Addendum Note (Signed)
Addended by: Sarina Ill B on: 06/21/2015 10:03 PM   Modules accepted: Orders

## 2015-06-22 ENCOUNTER — Encounter: Payer: Self-pay | Admitting: *Deleted

## 2015-06-22 ENCOUNTER — Ambulatory Visit: Payer: Medicare Other | Admitting: Podiatry

## 2015-06-22 NOTE — Progress Notes (Signed)
Faxed recent OV visit notes w/ low BP, and critical lab results from 06/21/15 to [phone] to Dr. Bevelyn Buckles, MD office. Received fax confirmation.

## 2015-06-23 ENCOUNTER — Ambulatory Visit
Admission: RE | Admit: 2015-06-23 | Discharge: 2015-06-23 | Disposition: A | Discharge status: Home / Self Care | Payer: Medicare Other | Source: Ambulatory Visit | Attending: Neurology | Admitting: Neurology

## 2015-06-23 DIAGNOSIS — R270 Ataxia, unspecified: Secondary | ICD-10-CM | POA: Diagnosis not present

## 2015-06-23 DIAGNOSIS — R42 Dizziness and giddiness: Secondary | ICD-10-CM

## 2015-06-23 DIAGNOSIS — H538 Other visual disturbances: Secondary | ICD-10-CM

## 2015-06-23 DIAGNOSIS — W19XXXA Unspecified fall, initial encounter: Secondary | ICD-10-CM

## 2015-06-23 DIAGNOSIS — N3000 Acute cystitis without hematuria: Secondary | ICD-10-CM

## 2015-06-23 DIAGNOSIS — R413 Other amnesia: Secondary | ICD-10-CM | POA: Diagnosis not present

## 2015-06-23 DIAGNOSIS — R410 Disorientation, unspecified: Secondary | ICD-10-CM

## 2015-06-23 DIAGNOSIS — F05 Delirium due to known physiological condition: Secondary | ICD-10-CM | POA: Diagnosis not present

## 2015-06-27 ENCOUNTER — Telehealth: Payer: Self-pay | Admitting: *Deleted

## 2015-06-27 NOTE — Telephone Encounter (Signed)
Pt states she would like to schedule an appt.

## 2015-06-28 ENCOUNTER — Telehealth: Payer: Self-pay | Admitting: *Deleted

## 2015-06-28 NOTE — Telephone Encounter (Signed)
Spoke with pt about stable CT head and it was unchanged from the one she had back in April. She verbalized understanding. Told pt to let her daughter know and she stated she will call and tell her.

## 2015-07-03 NOTE — Telephone Encounter (Signed)
Agree with assessment. Raeford Brandenburg, MD

## 2015-07-18 ENCOUNTER — Ambulatory Visit (INDEPENDENT_AMBULATORY_CARE_PROVIDER_SITE_OTHER): Payer: Medicare Other | Admitting: Podiatry

## 2015-07-18 ENCOUNTER — Encounter: Payer: Self-pay | Admitting: Podiatry

## 2015-07-18 DIAGNOSIS — B351 Tinea unguium: Secondary | ICD-10-CM | POA: Diagnosis not present

## 2015-07-18 DIAGNOSIS — M79673 Pain in unspecified foot: Secondary | ICD-10-CM | POA: Diagnosis not present

## 2015-07-18 NOTE — Progress Notes (Signed)
Patient ID: Donna Owens, female   DOB: 28-Nov-1928, 81 y.o.   MRN: [id number] Complaint:  Visit Type: Patient returns to my office for continued preventative foot care services. Complaint: Patient states" my nails have grown long and thick and become painful to walk and wear shoes" Patient has been diagnosed with DM .Marland Kitchen The patient presents for preventative foot care services. No changes to ROS  Podiatric Exam: Vascular: dorsalis pedis and posterior tibial pulses are palpable bilateral. Capillary return is immediate. Temperature gradient is WNL. Skin turgor WNL  Sensorium: Normal Semmes Weinstein monofilament test. Normal tactile sensation bilaterally. Nail Exam: Pt has thick disfigured discolored nails with subungual debris noted bilateral entire nail hallux through fifth toenails Ulcer Exam: There is no evidence of ulcer or pre-ulcerative changes or infection. Orthopedic Exam: Muscle tone and strength are WNL. No limitations in general ROM. No crepitus or effusions noted. Foot type and digits show no abnormalities. Bony prominences are unremarkable. Lower leg swelling noted. No swelling at this time at this visit. Skin: No Porokeratosis. No infection or ulcers  Diagnosis:  Onychomycosis, , Pain in right toe, pain in left toes  Treatment & Plan Procedures and Treatment: Consent by patient was obtained for treatment procedures. The patient understood the discussion of treatment and procedures well. All questions were answered thoroughly reviewed. Debridement of mycotic and hypertrophic toenails, 1 through 5 bilateral and clearing of subungual debris. No ulceration, no infection noted.  Return Visit-Office Procedure: Patient instructed to return to the office for a follow up visit 3 months for continued evaluation and treatment.

## 2015-07-20 ENCOUNTER — Telehealth: Payer: Self-pay | Admitting: Cardiovascular Disease

## 2015-07-20 ENCOUNTER — Encounter: Payer: Self-pay | Admitting: Neurology

## 2015-07-20 ENCOUNTER — Ambulatory Visit (INDEPENDENT_AMBULATORY_CARE_PROVIDER_SITE_OTHER): Payer: Medicare Other | Admitting: Neurology

## 2015-07-20 VITALS — BP 138/82 | HR 66 | Resp 20 | Ht 65.0 in | Wt 135.0 lb

## 2015-07-20 DIAGNOSIS — Z9989 Dependence on other enabling machines and devices: Principal | ICD-10-CM

## 2015-07-20 DIAGNOSIS — G4733 Obstructive sleep apnea (adult) (pediatric): Secondary | ICD-10-CM | POA: Diagnosis not present

## 2015-07-20 MED ORDER — HYDROXYZINE HCL 25 MG PO TABS: 25.0000 mg | ORAL_TABLET | Freq: Three times a day (TID) | ORAL | Status: DC | PRN

## 2015-07-20 NOTE — Progress Notes (Signed)
[facility]    Provider:  Dr Jaynee Eagles Referring Provider: Leanna Battles, MD Primary Care Physician:  [name], MD  CC:  Dizziness  HPI:  Donna Owens is a 81 y.o. female here as a referral from Dr. Philip Aspen for a fall. She has an urgent add-on today for the work in doctor because Dr. Roddie Mc is out of the office. Daughter wanted patient seen. She is accompanied by daughter today. She is lightheaded. Blood pressure this morning in the office is very low 96/50. Symptoms Started 2 months ago. She denies room spinning. She gets lightheasded like she is going to fall. She feels light headed in the morning when she gets up. She takes her blood pressure in the morning and iit is a little lower about 846 or 659 systolic. The dizziness is when she stands up and gets out of bed. No dizziness any other time during the day. She was getting up to go to the bathroom and she stood up and fell and her legs got caught up on herself and she slipped and fell. She hit her head.  she hit it on the right side of the head and tripped. She is more disoriented, going on for several weeks.     Reviewed notes, labs and imaging from outside physicians, which showed: Patient was seen in the ED in April after the fall. CT showed nothing acute (personally reviewed images). Labs at that time showed dehydration. Donna Owens is a 81 y.o. female here as a referral from Dr. Philip Aspen for a fall. She has an urgent add-on today for the work in doctor because Dr. Roddie Mc is out of the office. Daughter wanted patient seen. She is accompanied by daughter today. Patient reports 2 months of dizziness, lightheadedness, falls and recent confusion. Blood pressure this morning in the office is very low 96/50.  Home physical therapy for her gait and falls, she lives alone We'll get a stat CBC, CMP and urinalysis with reflex microscopy Discussed that her blood pressures. Lower on the office today and she might be  dehydrated again, this is likely the cause of her symptoms. Symptoms to sound orthostatic in nature. She needs to increase her fluid intake.She should follow-up Dr. Sharlett Iles. Daughter's cell phone number is [phone].   Interval history Donna Owens. From  07-20-15   The patient reports ongoing dizziness she was dehydrated in June been my colleague and partner Dr. Sarina Ill evaluated her.  Suffered a fall and Dr. Philip Aspen referred her here. She became an urgent "" work in Dr. patient that pressure in the office was 90/50.  The symptoms had begun 2 months earlier already in late April she denied any vertigo sensation of spinning but she gets but she confesses that she didn't like drinking water very much and he had took her blood pressure pills in the morning it seems that the medication in combination with the dehydration to her falls and dizziness. The dizziness now has continued. All she hit the right side of the head and tripped , remained for 2 weeks  disoriented after this this fall- meanwhile , this has improved. She increased her fluid intake. She developed severe lower extremity edema. Remains on all BP medications.  Labs reviewed after last visit. Her renal clearance is reduced by 50%, her creatinine was 1.5 for BUN 33 I believe that she was mainly dehydrated but she does not have a lot of muscle mass and so the creatinine is definitely elevated. She is  also anemic but claims that all her life this has been the case. The patient is of African-American and Native American descent.  ([place] - maternal grandmother).   She is also a patient of Dr. Annye Asa at the [facility]. She most likely has a cardiac reason for her fluid retention in both lower extremities. I assured her that her liver function was fine this was one of her concerns. Her renal function is decreased. I like for her to take a multivitamin with a little bit of iron and assured her that she does not  need a CPAP since she does not have enough apnea to justify the step. I think the recent confusion has cleared and she probably suffered a post concussion syndrome. Today's blood pressure has recovered to normal. She did have a urinary tract infection 4 weeks ago which has meanwhile been treated this also will have helped her to regain a normal cognitive status.  i encouraged her to speak to Dr Benn Moulder and Dr Philip Aspen.        Review of Systems: Patient complains of symptoms per HPI as well as the following symptoms: Weight gain, fatigue, blurred vision, shortness of breath, snoring, increased thirst, incontinence, joint swelling, aching muscles, urination problems, incontinence, confusion, slurred speech, tremor, depression, decreased energy, snoring, restless legs. Pertinent negatives per HPI. All others negative.   History   Social History  . Marital Status: Divorced    Spouse Name: N/A  . Number of Children: 1  . Years of Education: BSN   Occupational History  . RETIRED    Social History Main Topics  . Smoking status: Never Smoker   . Smokeless tobacco: Never Used  . Alcohol Use: No  . Drug Use: No  . Sexual Activity: No   Other Topics Concern  . Not on file   Social History Narrative   Patient is divorced and lives alone.   Patient has one adult daughter.   Patient is retired.   Patient has a BSN degree in nursing.   Patient is right-handed.   Patient drinks one soda per week.    Family History  Problem Relation Age of Onset  . Diabetes Other   . Diabetes Brother   . Hypertension Brother   . Heart failure Daughter 31    Past Medical History  Diagnosis Date  . Diabetes mellitus   . Hypertension   . Cancer   . Hyperlipemia   . Pulmonary hypertension     moderate echo 11/07/10  . Carotid arterial disease 12/13/11     mild by doppler  . PVD (peripheral vascular disease)   . OSA (obstructive sleep apnea)   . Anemia   . Atrophic vaginitis   . Sleep apnea    . Osteoporosis   . CVA (cerebral vascular accident)   . Blood transfusion without reported diagnosis   . OSA on CPAP 06/20/2014    Past Surgical History  Procedure Laterality Date  . Breast surgery      right 07/26/2000  . Breast lumpectomy Right     chemo/radiation  . Breast lumpectomy Left     benign  . Colonoscopy  2010  . Ingrown toenails      removed in 03/2013  . Cardioversion N/A 07/21/2014    Procedure: CARDIOVERSION;  Surgeon: Sanda Klein, MD;  Location: [facility];  Service: Cardiovascular;  Laterality: N/A;    Current Outpatient Prescriptions  Medication Sig Dispense Refill  . acetaminophen (TYLENOL) 500 MG  tablet Take 500 mg by mouth every 6 (six) hours as needed for moderate pain.    Marland Kitchen apixaban (ELIQUIS) 5 MG TABS tablet Take 1 tablet (5 mg total) by mouth 2 (two) times daily. HOLD UNTIL INSTRUCTED TO RESTART 60 tablet 3  . aspirin EC 81 MG tablet Take 1 tablet (81 mg total) by mouth daily. 30 tablet 11  . calcium-vitamin D (OSCAL WITH D) 500-200 MG-UNIT per tablet Take 1 tablet by mouth daily with breakfast.     . carvedilol (COREG) 25 MG tablet Take 25 mg by mouth 2 (two) times daily with a meal.    . Cholecalciferol (VITAMIN D-3) 1000 UNITS CAPS Take 1 capsule by mouth daily.    . Coenzyme Q10 (CO Q 10) 100 MG CAPS Take 1 capsule by mouth daily.    . diazepam (VALIUM) 5 MG tablet Take 1 tablet (5 mg total) by mouth every 8 (eight) hours as needed (dizziness). 5 tablet 0  . hydrOXYzine (ATARAX/VISTARIL) 25 MG tablet Take 25 mg by mouth 3 (three) times daily as needed for itching.    Marland Kitchen lisinopril (PRINIVIL,ZESTRIL) 20 MG tablet Take 2 tablets (40 mg total) by mouth daily. (Patient taking differently: Take 20 mg by mouth 2 (two) times daily. ) 180 tablet 3  . metFORMIN (GLUCOPHAGE-XR) 500 MG 24 hr tablet Take 500 mg by mouth 2 (two) times daily.    . multivitamin (METANX) 3-35-2 MG TABS tablet Take 1 tablet by mouth 2 (two) times daily. 60 tablet 11  . NIFEdipine  (NIFEDICAL XL) 30 MG 24 hr tablet Take 1 tablet (30 mg total) by mouth daily. 90 tablet 1  . Polyethyl Glycol-Propyl Glycol (SYSTANE OP) Apply 1 drop to eye 2 (two) times daily. SYSTANE    . pravastatin (PRAVACHOL) 40 MG tablet Take 40 mg by mouth daily.    . promethazine (PHENERGAN) 25 MG tablet Take 25 mg by mouth daily. nausea    . sulfamethoxazole-trimethoprim (BACTRIM DS,SEPTRA DS) 800-160 MG per tablet Take 1 tablet by mouth 2 (two) times daily. 6 tablet 0  . topiramate (TOPAMAX) 25 MG tablet Take 25 mg by mouth daily.  12  . triamcinolone cream (KENALOG) 0.1 % Apply 1 application topically 2 (two) times daily. (Patient taking differently: Apply 1 application topically 2 (two) times daily as needed (rash/sore). On left foot.) 30 g 0  . vitamin B-12 (CYANOCOBALAMIN) 1000 MCG tablet Take 1,000 mcg by mouth 2 (two) times daily.      No current facility-administered medications for this visit.    Allergies as of 07/20/2015  . (No Known Allergies)    Vitals: BP 138/82 mmHg  Pulse 66  Resp 20  Ht 5\' 5"  (1.651 m)  Wt 135 lb (61.236 kg)  BMI 22.47 kg/m2 Last Weight:  Wt Readings from Last 1 Encounters:  07/20/15 135 lb (61.236 kg)   Last Height:   Ht Readings from Last 1 Encounters:  07/20/15 5\' 5"  (1.651 m)   Physical exam:  General: The patient is awake, alert and appears not in acute distress. The patient is well groomed. Head: Normocephalic, atraumatic. Neck is supple. Mallampati 4 , neck circumference:13.5 inches  Cardiovascular:  Regular rate and rhythm, slow , without  murmurs or carotid bruit, and without distended neck veins. Respiratory: Lungs are clear to auscultation. Skin:  With evidence of lower extremity edema, not  rash Trunk:  patient has normal posture.  Neurologic exam : The patient is awake and alert, oriented to place and time.  Memory subjective  described as intact. Recovered . There is a normal attention span & concentration ability.  Speech is fluent  without dysarthria, dysphonia or aphasia. Mood and affect are appropriate.  Cranial nerves: Pupils are equal and briskly reactive to light. Funduscopic exam without  evidence of pallor or edema.  She is status post cataract surgery . Extraocular movements  in vertical and horizontal planes intact and without nystagmus. Visual fields by finger perimetry are intact. Hearing to finger rub intact.  Facial sensation intact to fine touch. Facial motor strength is symmetric and tongue and uvula move midline.  Motor exam:  Normal tone ,muscle bulk and symmetric strength in all extremities. Patient has a strong grip.   Sensory:  Fine touch, pinprick and vibration were tested in all extremities.  Vibration is decreased in both feet beginning at the ankle.  Proprioception is  normal.  Coordination: Rapid alternating movements in the fingers/hands is tested and normal.  Finger-to-nose maneuver  with evidence of ataxia, dysmetria  but not  tremor.  Gait and station: Patient walks without assistive device, climbs up to the exam table.  Strength within normal limits. Stance is wider based , stable .  Steps are unfragmented. Romberg testing is normal- yet the patient had several falls over the last 6 month ( 3 or 4) .  Deep tendon reflexes: in the upper and lower extremities are attenuated, the right knee has been hurting and patella reflex is attenuated.   Babinski maneuver response is downgoing.   Assessment:  After physical and neurologic examination, review of laboratory studies, imaging, neurophysiology testing and pre-existing records, assessment is   1) pain related poorer sleep and cumulative sleep deprivation.  Not sure apnea plays a role. She reports ongoing headaches that are present in the morning when she wakes. OSA was not found, therefor CPAP not needed. She mentioned chest pain and has agin palpitation , returned after her cardiac conversion last year. Needs to see Dr Benn Moulder.  2) OSA /  CSA was mild in 2011, no need for CPAP now after weight loss. The patient gained and lost weight in th time between her last PSG and now.  3) the patient reports ongoing dizziness she was dehydrated in June been my colleague and partner Dr. Sarina Ill evaluated her.  Suffered a fall and Dr. Philip Aspen referred her here. She became an urgent "" work in Dr. patient that pressure in the office was 90/50.  The symptoms had begun 2 months earlier already in late April she denied any vertigo sensation of spinning but she gets but she confesses that she didn't like drinking water very much and he had took her blood pressure pills in the morning it seems that the medication in combination with the dehydration to her falls and dizziness. The dizziness now has continued. All she hit the right side of the head and tripped , remained for 2 weeks  disoriented after this this fall- meanwhile , this has improved. 4) neuropathy related sensory loss and less gait stability, increased fall risk. Decreased proprioception.         Assessment/Plan:    I called Dr. Benn Moulder  and ask him to see the patient as soon as possible think she has returned to paroxysmal atrial fibrillation this is been explanation for both could lead to the headaches in the morning to shortness of breath to feeling dizzy and lightheaded and to her ankle edema. She has significant problems with heat and humidity tolerance.  Larey Seat, MD   [facility] [address] [place], [place] [address]  Phone [phone] Fax [phone]  A total of 30 minutes was spent face-to-face with this patient. Over half this time was spent on counseling patient on the orthostatic hypotension diagnosis and different diagnostic and therapeutic options available.   Marland Kitchen

## 2015-07-20 NOTE — Telephone Encounter (Signed)
Calling because she is in Afib and had a fall .Marland KitchenPlease call and lmsg on home phone . Patient is at the Dr. Westley Hummer office right now . Thanks

## 2015-07-20 NOTE — Telephone Encounter (Signed)
Spoke to patient. She has had recent falls and episodes where she feels palpitations. These are more or less daily. This has been occuring for at least 1 month. She is not observant of any problems of fatigue or shortness of breath when these occur.  Notes DCCV 1 year ago.  Seen in June by Dr. Loletha Grayer- taken off eliquis d/t concern of falls at that time.  Seen by neurology today and advised to follow up w/ [place] to see if sooner appt available than her sched visit on 9/14.  She also notes problem w/ ankles swelling. This is most noticeable in AM, gradually goes down throughout the day.  She is not aware of any weight gain, or orthopnea.  Informed pt I would f/u tomorrow to see if we can work in for flex visit or if sooner advised. Noted she does not want to see extender, however, if A fib Clinic appropriate to investigate her palpitations, I can discuss the benefits of this w/ her.  Will defer to [employer] for additional suggestions

## 2015-07-20 NOTE — Telephone Encounter (Signed)
Sounds good .Marland Kitchen Try to get her in to be seen.  Dr. Lemmie Evens

## 2015-07-21 ENCOUNTER — Telehealth: Payer: Self-pay | Admitting: *Deleted

## 2015-07-21 ENCOUNTER — Ambulatory Visit (HOSPITAL_COMMUNITY)
Admission: RE | Admit: 2015-07-21 | Discharge: 2015-07-21 | Disposition: A | Discharge status: Home / Self Care | Payer: Medicare Other | Source: Ambulatory Visit | Attending: Nurse Practitioner | Admitting: Nurse Practitioner

## 2015-07-21 ENCOUNTER — Encounter (HOSPITAL_COMMUNITY): Payer: Self-pay | Admitting: Nurse Practitioner

## 2015-07-21 VITALS — BP 160/74 | HR 53 | Ht 65.0 in | Wt 133.8 lb

## 2015-07-21 DIAGNOSIS — E119 Type 2 diabetes mellitus without complications: Secondary | ICD-10-CM | POA: Insufficient documentation

## 2015-07-21 DIAGNOSIS — Z7902 Long term (current) use of antithrombotics/antiplatelets: Secondary | ICD-10-CM | POA: Diagnosis not present

## 2015-07-21 DIAGNOSIS — I272 Other secondary pulmonary hypertension: Secondary | ICD-10-CM | POA: Diagnosis not present

## 2015-07-21 DIAGNOSIS — Z79899 Other long term (current) drug therapy: Secondary | ICD-10-CM | POA: Diagnosis not present

## 2015-07-21 DIAGNOSIS — Z833 Family history of diabetes mellitus: Secondary | ICD-10-CM | POA: Insufficient documentation

## 2015-07-21 DIAGNOSIS — R600 Localized edema: Secondary | ICD-10-CM | POA: Insufficient documentation

## 2015-07-21 DIAGNOSIS — Z8673 Personal history of transient ischemic attack (TIA), and cerebral infarction without residual deficits: Secondary | ICD-10-CM | POA: Diagnosis not present

## 2015-07-21 DIAGNOSIS — I4891 Unspecified atrial fibrillation: Secondary | ICD-10-CM | POA: Diagnosis not present

## 2015-07-21 DIAGNOSIS — I739 Peripheral vascular disease, unspecified: Secondary | ICD-10-CM | POA: Diagnosis not present

## 2015-07-21 DIAGNOSIS — G4733 Obstructive sleep apnea (adult) (pediatric): Secondary | ICD-10-CM | POA: Insufficient documentation

## 2015-07-21 DIAGNOSIS — I10 Essential (primary) hypertension: Secondary | ICD-10-CM | POA: Diagnosis not present

## 2015-07-21 DIAGNOSIS — Z8249 Family history of ischemic heart disease and other diseases of the circulatory system: Secondary | ICD-10-CM | POA: Diagnosis not present

## 2015-07-21 DIAGNOSIS — E785 Hyperlipidemia, unspecified: Secondary | ICD-10-CM | POA: Diagnosis not present

## 2015-07-21 DIAGNOSIS — Z7982 Long term (current) use of aspirin: Secondary | ICD-10-CM | POA: Insufficient documentation

## 2015-07-21 LAB — COMPREHENSIVE METABOLIC PANEL
ALBUMIN: 3.8 g/dL (ref 3.5–5.0)
ALT: 17 U/L (ref 14–54)
ANION GAP: 7 (ref 5–15)
AST: 23 U/L (ref 15–41)
Alkaline Phosphatase: 45 U/L (ref 38–126)
BUN: 21 mg/dL — ABNORMAL HIGH (ref 6–20)
CO2: 26 mmol/L (ref 22–32)
Calcium: 9.7 mg/dL (ref 8.9–10.3)
Chloride: 106 mmol/L (ref 101–111)
Creatinine, Ser: 1.34 mg/dL — ABNORMAL HIGH (ref 0.44–1.00)
GFR calc non Af Amer: 35 mL/min — ABNORMAL LOW (ref >60)
GFR, EST AFRICAN AMERICAN: 41 mL/min — AB (ref >60)
GLUCOSE: 117 mg/dL — AB (ref 65–99)
Potassium: 4.3 mmol/L (ref 3.5–5.1)
SODIUM: 139 mmol/L (ref 135–145)
Total Bilirubin: 0.7 mg/dL (ref 0.3–1.2)
Total Protein: 6.9 g/dL (ref 6.5–8.1)

## 2015-07-21 LAB — CBC
HEMATOCRIT: 34.1 % — AB (ref 36.0–46.0)
Hemoglobin: 11.6 g/dL — ABNORMAL LOW (ref 12.0–15.0)
MCH: 30.9 pg (ref 26.0–34.0)
MCHC: 34 g/dL (ref 30.0–36.0)
MCV: 90.7 fL (ref 78.0–100.0)
Platelets: 176 10*3/uL (ref 150–400)
RBC: 3.76 MIL/uL — ABNORMAL LOW (ref 3.87–5.11)
RDW: 14.3 % (ref 11.5–15.5)
WBC: 6.4 10*3/uL (ref 4.0–10.5)

## 2015-07-21 LAB — TSH: TSH: 0.863 u[IU]/mL (ref 0.350–4.500)

## 2015-07-21 NOTE — Telephone Encounter (Signed)
Spoke to Donna Owens this morning at [facility] - she is able to see patient, set up appt for her at 10:30am.   I called pt to inform - gave instructions, directions, number to call for A Fib clinic.  Pt states understanding of plan. Questions answered to patient's satisfaction.

## 2015-07-21 NOTE — Patient Instructions (Signed)
Butch Penny will call you after discussion with Dr. Loletha Grayer

## 2015-07-24 ENCOUNTER — Encounter (HOSPITAL_COMMUNITY): Payer: Self-pay | Admitting: Nurse Practitioner

## 2015-07-24 NOTE — Progress Notes (Signed)
Patient ID: Donna Owens, female   DOB: 1928-10-27, 81 y.o.   MRN: [id number]    Primary Care Physician: [name], MD Referring Physician: Rexford Maus Triage   Donna Owens is a 81 y.o. female with a h/o DM, HTN, PUL HTN, OSA,prior CVA, afib and frequent falls, last being  June of this year. Pt called into [facility] triage with c/o's of rt upper chest discomfort,(now resolved) dizziness and was asked to f/u with afib clinic. She has had two neurology visits, one end of June and another end of July for dizziness and falls. She was in the ED in April for a fall as well, thought to be due to dehydration. She was also urged to f/u sooner than September due to presence of irregular heartbeat/pedal edema with recent neuro appointment.    She is c/o dizziness but states this is her usual for sometime. She feels most dizzy when she first gets up but this will improve within a few hours. She states her balance is terrible and she stumbles all the time. Especially when she has to turn quickly, she will feel likely to fall. She has noticed increase in lower extremity edema over the last month. Ekg shows afib with slow ventricular response. She had similar findings on EKG one year ago, started on blood thinners and  after appropriate period of time had successful cardioversion.Blood thinners were d/ced in March due to falls.She may feel a few heart skpis but not really aware of afib.   Today, she denies symptoms of  shortness of breath, orthopnea, PND. Positive for lower extremity edema, chronic dizziness, few palpitations. Frequent falls.The patient is tolerating medications without difficulties and is otherwise without complaint today.   Past Medical History  Diagnosis Date  . Diabetes mellitus   . Hypertension   . Cancer   . Hyperlipemia   . Pulmonary hypertension     moderate echo 11/07/10  . Carotid arterial disease 12/13/11     mild by doppler  . PVD (peripheral vascular disease)   .  OSA (obstructive sleep apnea)   . Anemia   . Atrophic vaginitis   . Sleep apnea   . Osteoporosis   . CVA (cerebral vascular accident)   . Blood transfusion without reported diagnosis   . OSA on CPAP 06/20/2014   Past Surgical History  Procedure Laterality Date  . Breast surgery      right 07/26/2000  . Breast lumpectomy Right     chemo/radiation  . Breast lumpectomy Left     benign  . Colonoscopy  2010  . Ingrown toenails      removed in 03/2013  . Cardioversion N/A 07/21/2014    Procedure: CARDIOVERSION;  Surgeon: Sanda Klein, MD;  Location: [facility];  Service: Cardiovascular;  Laterality: N/A;    Current Outpatient Prescriptions  Medication Sig Dispense Refill  . acetaminophen (TYLENOL) 500 MG tablet Take 500 mg by mouth every 6 (six) hours as needed for moderate pain.    Marland Kitchen aspirin EC 81 MG tablet Take 1 tablet (81 mg total) by mouth daily. 30 tablet 11  . calcium-vitamin D (OSCAL WITH D) 500-200 MG-UNIT per tablet Take 1 tablet by mouth daily with breakfast.     . carvedilol (COREG) 25 MG tablet Take 25 mg by mouth 2 (two) times daily with a meal.    . Cholecalciferol (VITAMIN D-3) 1000 UNITS CAPS Take 1 capsule by mouth daily.    . Coenzyme Q10 (CO Q 10) 100 MG  CAPS Take 1 capsule by mouth daily.    . diazepam (VALIUM) 5 MG tablet Take 1 tablet (5 mg total) by mouth every 8 (eight) hours as needed (dizziness). 5 tablet 0  . hydrOXYzine (ATARAX/VISTARIL) 25 MG tablet Take 1 tablet (25 mg total) by mouth 3 (three) times daily as needed for itching. 30 tablet 2  . lisinopril (PRINIVIL,ZESTRIL) 20 MG tablet Take 2 tablets (40 mg total) by mouth daily. (Patient taking differently: Take 20 mg by mouth 2 (two) times daily. ) 180 tablet 3  . metFORMIN (GLUCOPHAGE-XR) 500 MG 24 hr tablet Take 500 mg by mouth 2 (two) times daily.    . multivitamin (METANX) 3-35-2 MG TABS tablet Take 1 tablet by mouth 2 (two) times daily. 60 tablet 11  . NIFEdipine (NIFEDICAL XL) 30 MG 24 hr tablet  Take 1 tablet (30 mg total) by mouth daily. 90 tablet 1  . Polyethyl Glycol-Propyl Glycol (SYSTANE OP) Apply 1 drop to eye 2 (two) times daily. SYSTANE    . pravastatin (PRAVACHOL) 40 MG tablet Take 40 mg by mouth daily.    . promethazine (PHENERGAN) 25 MG tablet Take 25 mg by mouth daily. nausea    . triamcinolone cream (KENALOG) 0.1 % Apply 1 application topically 2 (two) times daily. (Patient taking differently: Apply 1 application topically 2 (two) times daily as needed (rash/sore). On left foot.) 30 g 0  . vitamin B-12 (CYANOCOBALAMIN) 1000 MCG tablet Take 1,000 mcg by mouth 2 (two) times daily.     Marland Kitchen apixaban (ELIQUIS) 5 MG TABS tablet Take 1 tablet (5 mg total) by mouth 2 (two) times daily. HOLD UNTIL INSTRUCTED TO RESTART (Patient not taking: Reported on 07/21/2015) 60 tablet 3  . sulfamethoxazole-trimethoprim (BACTRIM DS,SEPTRA DS) 800-160 MG per tablet Take 1 tablet by mouth 2 (two) times daily. 6 tablet 0   No current facility-administered medications for this encounter.    No Known Allergies  History   Social History  . Marital Status: Divorced    Spouse Name: N/A  . Number of Children: 1  . Years of Education: BSN   Occupational History  . RETIRED    Social History Main Topics  . Smoking status: Never Smoker   . Smokeless tobacco: Never Used  . Alcohol Use: No  . Drug Use: No  . Sexual Activity: No   Other Topics Concern  . Not on file   Social History Narrative   Patient is divorced and lives alone.   Patient has one adult daughter.   Patient is retired.   Patient has a BSN degree in nursing.   Patient is right-handed.   Patient drinks one soda per week.    Family History  Problem Relation Age of Onset  . Diabetes Other   . Diabetes Brother   . Hypertension Brother   . Heart failure Daughter 40    ROS- All systems are reviewed and negative except as per the HPI above  Physical Exam: Filed Vitals:   07/21/15 1040  BP: 160/74  Pulse: 53  Height:  5\' 5"  (1.651 m)  Weight: 133 lb 12.8 oz (60.691 kg)    GEN- The patient is well appearing, alert and oriented x 3 today.   Head- normocephalic, atraumatic Eyes-  Sclera clear, conjunctiva pink Ears- hearing intact Oropharynx- clear Neck- supple, no JVP Lymph- no cervical lymphadenopathy Lungs- Clear to ausculation bilaterally, normal work of breathing Heart- Slow irregular rate and rhythm, no murmurs, rubs or gallops, PMI not laterally displaced  GI- soft, NT, ND, + BS Extremities- no clubbing, cyanosis, or edema MS- no significant deformity or atrophy Skin- no rash or lesion Psych- euthymic mood, full affect Neuro- strength and sensation are intact  EKG- Afib with slow ventricular response at 53 bpm, QRS int, 78 ms, QTc 388 ms. Epic records reviewed  Assessment and Plan: 1. Afib with slow ventricular response Pt currently not on blood thinners and is considered a fall risk. I will need to get Dr. Lurline Del input to see how to proceed, place back on blood thinners short term and cardiovert or rate control. Pt may benefit from backing off carvedilol if she is left in afib, for a higher ventricular response. Slower v response could contribute to dizziness as well.  2. Lower extremity edema Could be multifactorial, afib could be playing into this. Not a candidate for diuretics, due to  h/o dehydration. Elevate legs Avoid salt Compression socks  3. OSA Thought not to be a issue since pt has lost weight.  4. Falls/dizziness A cause for concern with blood thinners Previous dehydration Labs today, cbc, cmet, tsh   5. Chadsvasc score of at least 7 Concerning for CVA, due to fall risk off blood thinners, continue ASA.   Will staff message Dr. Loletha Grayer for further input/evaluation.   Donna Owens, [facility] [address] [place], [place] [address] [phone]

## 2015-07-25 ENCOUNTER — Telehealth (HOSPITAL_COMMUNITY): Payer: Self-pay | Admitting: *Deleted

## 2015-07-25 NOTE — Telephone Encounter (Signed)
Reduce carvedilol to half of current dose and I will see back in one week for evaluation.      ----- Message -----     From: Sanda Klein, MD     Sent: 07/25/2015 12:36 PM      To: Sherran Needs, NP        I think odds of successful long term maintenance of normal rhythm is low and bleeding risk is too high. Let's cut back on carvedilol to 1/2 of current dose. Thanks for seeing her.    MCr    Above note from Dr. Sallyanne Kuster after patient's visit with Roderic Palau, NP -- patient informed to reduce coreg dose to 12.5mg  twice a day and appointment made for 1 week follow up. Patient verbalized understanding.

## 2015-07-28 DIAGNOSIS — Z6821 Body mass index (BMI) 21.0-21.9, adult: Secondary | ICD-10-CM | POA: Diagnosis not present

## 2015-07-28 DIAGNOSIS — R269 Unspecified abnormalities of gait and mobility: Secondary | ICD-10-CM | POA: Diagnosis not present

## 2015-07-28 DIAGNOSIS — I4892 Unspecified atrial flutter: Secondary | ICD-10-CM | POA: Diagnosis not present

## 2015-07-28 DIAGNOSIS — I739 Peripheral vascular disease, unspecified: Secondary | ICD-10-CM | POA: Diagnosis not present

## 2015-07-28 DIAGNOSIS — G4733 Obstructive sleep apnea (adult) (pediatric): Secondary | ICD-10-CM | POA: Diagnosis not present

## 2015-07-28 DIAGNOSIS — R600 Localized edema: Secondary | ICD-10-CM | POA: Diagnosis not present

## 2015-07-31 ENCOUNTER — Other Ambulatory Visit: Payer: Self-pay

## 2015-07-31 ENCOUNTER — Ambulatory Visit (HOSPITAL_COMMUNITY)
Admission: RE | Admit: 2015-07-31 | Discharge: 2015-07-31 | Disposition: A | Discharge status: Home / Self Care | Payer: Medicare Other | Source: Ambulatory Visit | Attending: Nurse Practitioner | Admitting: Nurse Practitioner

## 2015-07-31 VITALS — BP 118/58 | HR 44 | Ht 65.0 in | Wt 137.2 lb

## 2015-07-31 DIAGNOSIS — Z8673 Personal history of transient ischemic attack (TIA), and cerebral infarction without residual deficits: Secondary | ICD-10-CM | POA: Insufficient documentation

## 2015-07-31 DIAGNOSIS — I10 Essential (primary) hypertension: Secondary | ICD-10-CM | POA: Diagnosis not present

## 2015-07-31 DIAGNOSIS — Z8249 Family history of ischemic heart disease and other diseases of the circulatory system: Secondary | ICD-10-CM | POA: Insufficient documentation

## 2015-07-31 DIAGNOSIS — I4891 Unspecified atrial fibrillation: Secondary | ICD-10-CM

## 2015-07-31 DIAGNOSIS — Z7982 Long term (current) use of aspirin: Secondary | ICD-10-CM | POA: Diagnosis not present

## 2015-07-31 DIAGNOSIS — R42 Dizziness and giddiness: Secondary | ICD-10-CM | POA: Diagnosis not present

## 2015-07-31 DIAGNOSIS — E119 Type 2 diabetes mellitus without complications: Secondary | ICD-10-CM | POA: Diagnosis not present

## 2015-07-31 DIAGNOSIS — R600 Localized edema: Secondary | ICD-10-CM | POA: Insufficient documentation

## 2015-07-31 DIAGNOSIS — I739 Peripheral vascular disease, unspecified: Secondary | ICD-10-CM | POA: Diagnosis not present

## 2015-07-31 DIAGNOSIS — E785 Hyperlipidemia, unspecified: Secondary | ICD-10-CM | POA: Insufficient documentation

## 2015-07-31 DIAGNOSIS — Z833 Family history of diabetes mellitus: Secondary | ICD-10-CM | POA: Insufficient documentation

## 2015-07-31 DIAGNOSIS — Z79899 Other long term (current) drug therapy: Secondary | ICD-10-CM | POA: Insufficient documentation

## 2015-07-31 DIAGNOSIS — Z7902 Long term (current) use of antithrombotics/antiplatelets: Secondary | ICD-10-CM | POA: Diagnosis not present

## 2015-07-31 DIAGNOSIS — I6523 Occlusion and stenosis of bilateral carotid arteries: Secondary | ICD-10-CM | POA: Insufficient documentation

## 2015-07-31 DIAGNOSIS — I272 Other secondary pulmonary hypertension: Secondary | ICD-10-CM | POA: Insufficient documentation

## 2015-07-31 MED ORDER — CARVEDILOL 12.5 MG PO TABS: 12.5000 mg | ORAL_TABLET | Freq: Two times a day (BID) | ORAL | Status: DC

## 2015-07-31 NOTE — Progress Notes (Signed)
Patient ID: Donna Owens, female   DOB: 11-19-1928, 81 y.o.   MRN: [id number]    Primary Care Physician: [name], MD Referring Physician: Rexford Maus Triage Cardiologist: Dr. Artist Beach is a 81 y.o. female with a h/o DM, HTN, PUL HTN, OSA,prior CVA, afib and frequent falls, last being  June of this year. Pt called into [facility] with c/o's of rt upper chest discomfort,(now resolved) dizziness and was asked to f/u with afib clinic. She has had two neurology visits, one end of June and another end of July for dizziness and falls. She was in the ED in April for a fall as well, thought to be due to dehydration. She was also urged to f/u sooner than September due to presence of irregular heartbeat/pedal edema with recent neuro appointment.     She states her balance is terrible and she stumbles all the time. Especially when she has to turn quickly, she will feel likely to fall. She has noticed increase in lower extremity edema over the last month. Ekg shows afib with slow ventricular response. She had similar findings on EKG one year ago, started on blood thinners and  after appropriate period of time had successful cardioversion.Blood thinners were d/ced in March due to falls.She may feel a few heart skpis but not really aware of afib.   On last visit, after discussion via staff message with Dr. Loletha Grayer, it was decided to keep pt off blood thinners due to poor balance and frequent falls, let her stay in afib but decrease BB to allow heart rate to come up. Pt did this for a few days but then was told by  PCP not to cut drug and a new rx would be sent in for 12.5 coreg bid. The new rx was not at drugstore when she went Friday pm, so for the remainder of the weekend and this am, she continued 25 mg bid,  heart rate is now 44 bpm. Surprisingly, pt is tolerating and says she doesn't not feel anymore lightheaded than usual. Pedal edema is better. She has been elevating feet when  sitting.  Today, she denies symptoms of  shortness of breath, orthopnea, PND. Positive for lower extremity edema, chronic dizziness, few palpitations. Frequent falls.The patient is tolerating medications without difficulties and is otherwise without complaint today.   Past Medical History  Diagnosis Date  . Diabetes mellitus   . Hypertension   . Cancer   . Hyperlipemia   . Pulmonary hypertension     moderate echo 11/07/10  . Carotid arterial disease 12/13/11     mild by doppler  . PVD (peripheral vascular disease)   . OSA (obstructive sleep apnea)   . Anemia   . Atrophic vaginitis   . Sleep apnea   . Osteoporosis   . CVA (cerebral vascular accident)   . Blood transfusion without reported diagnosis   . OSA on CPAP 06/20/2014   Past Surgical History  Procedure Laterality Date  . Breast surgery      right 07/26/2000  . Breast lumpectomy Right     chemo/radiation  . Breast lumpectomy Left     benign  . Colonoscopy  2010  . Ingrown toenails      removed in 03/2013  . Cardioversion N/A 07/21/2014    Procedure: CARDIOVERSION;  Surgeon: Sanda Klein, MD;  Location: [facility];  Service: Cardiovascular;  Laterality: N/A;    Current Outpatient Prescriptions  Medication Sig Dispense Refill  . acetaminophen (  TYLENOL) 500 MG tablet Take 500 mg by mouth every 6 (six) hours as needed for moderate pain.    Marland Kitchen aspirin EC 81 MG tablet Take 1 tablet (81 mg total) by mouth daily. 30 tablet 11  . calcium-vitamin D (OSCAL WITH D) 500-200 MG-UNIT per tablet Take 1 tablet by mouth daily with breakfast.     . carvedilol (COREG) 12.5 MG tablet Take 1 tablet (12.5 mg total) by mouth 2 (two) times daily with a meal. 60 tablet 2  . Cholecalciferol (VITAMIN D-3) 1000 UNITS CAPS Take 1 capsule by mouth daily.    . Coenzyme Q10 (CO Q 10) 100 MG CAPS Take 1 capsule by mouth daily.    . diazepam (VALIUM) 5 MG tablet Take 1 tablet (5 mg total) by mouth every 8 (eight) hours as needed (dizziness). 5 tablet  0  . hydrOXYzine (ATARAX/VISTARIL) 25 MG tablet Take 1 tablet (25 mg total) by mouth 3 (three) times daily as needed for itching. 30 tablet 2  . lisinopril (PRINIVIL,ZESTRIL) 20 MG tablet Take 2 tablets (40 mg total) by mouth daily. (Patient taking differently: Take 20 mg by mouth 2 (two) times daily. ) 180 tablet 3  . metFORMIN (GLUCOPHAGE-XR) 500 MG 24 hr tablet Take 500 mg by mouth 2 (two) times daily.    Marland Kitchen NIFEdipine (NIFEDICAL XL) 30 MG 24 hr tablet Take 1 tablet (30 mg total) by mouth daily. 90 tablet 1  . Polyethyl Glycol-Propyl Glycol (SYSTANE OP) Apply 1 drop to eye 2 (two) times daily. SYSTANE    . pravastatin (PRAVACHOL) 40 MG tablet Take 40 mg by mouth daily.    . promethazine (PHENERGAN) 25 MG tablet Take 25 mg by mouth daily. nausea    . triamcinolone cream (KENALOG) 0.1 % Apply 1 application topically 2 (two) times daily. (Patient taking differently: Apply 1 application topically 2 (two) times daily as needed (rash/sore). On left foot.) 30 g 0  . vitamin B-12 (CYANOCOBALAMIN) 1000 MCG tablet Take 1,000 mcg by mouth 2 (two) times daily.     Marland Kitchen apixaban (ELIQUIS) 5 MG TABS tablet Take 1 tablet (5 mg total) by mouth 2 (two) times daily. HOLD UNTIL INSTRUCTED TO RESTART (Patient not taking: Reported on 07/31/2015) 60 tablet 3  . multivitamin (METANX) 3-35-2 MG TABS tablet Take 1 tablet by mouth 2 (two) times daily. (Patient not taking: Reported on 07/31/2015) 60 tablet 11  . sulfamethoxazole-trimethoprim (BACTRIM DS,SEPTRA DS) 800-160 MG per tablet Take 1 tablet by mouth 2 (two) times daily. (Patient not taking: Reported on 07/31/2015) 6 tablet 0   No current facility-administered medications for this encounter.    No Known Allergies  History   Social History  . Marital Status: Divorced    Spouse Name: N/A  . Number of Children: 1  . Years of Education: BSN   Occupational History  . RETIRED    Social History Main Topics  . Smoking status: Never Smoker   . Smokeless tobacco: Never  Used  . Alcohol Use: No  . Drug Use: No  . Sexual Activity: No   Other Topics Concern  . Not on file   Social History Narrative   Patient is divorced and lives alone.   Patient has one adult daughter.   Patient is retired.   Patient has a BSN degree in nursing.   Patient is right-handed.   Patient drinks one soda per week.    Family History  Problem Relation Age of Onset  . Diabetes Other   .  Diabetes Brother   . Hypertension Brother   . Heart failure Daughter 40    ROS- All systems are reviewed and negative except as per the HPI above  Physical Exam: Filed Vitals:   07/31/15 1015  BP: 118/58  Pulse: 44  Height: 5\' 5"  (1.651 m)  Weight: 137 lb 3.2 oz (62.234 kg)    GEN- The patient is well appearing, alert and oriented x 3 today.   Head- normocephalic, atraumatic Eyes-  Sclera clear, conjunctiva pink Ears- hearing intact Oropharynx- clear Neck- supple, no JVP Lymph- no cervical lymphadenopathy Lungs- Clear to ausculation bilaterally, normal work of breathing Heart- Slow irregular rate and rhythm, no murmurs, rubs or gallops, PMI not laterally displaced GI- soft, NT, ND, + BS Extremities- no clubbing, cyanosis, or edema MS- no significant deformity or atrophy Skin- no rash or lesion Psych- euthymic mood, full affect Neuro- strength and sensation are intact  EKG- Afib with slow ventricular response at 44 bpm, QRS int, 74 ms, QTc 360 ms. Epic records reviewed  Assessment and Plan: 1. Afib with slow ventricular response Decrease coreg to 12.5 mg bid. New RX called in for pt. Prefers over cutting. F/u in one week for rate response. If no improvement, may need additional reduction in BB, may need to consider need for PPM.  2. Lower extremity edema Could be multifactorial, afib could be playing into this. Not a candidate for diuretics, due to  h/o dehydration. Elevate legs Avoid salt Compression socks Edema improved on this visit with elevating legs.  3.  OSA Thought not to be a issue since pt has lost weight.  4. Falls/dizziness A cause for concern with blood thinners Previous dehydration Stay off blood thinners per conversation with Dr. Loletha Grayer.  5. Chadsvasc score of at least 7 Concerning for CVA, due to fall risk off blood thinners, continue ASA.   F/u in one week.  Geroge Baseman Mikel [place], [facility] [address] [place], [place] [address] [phone]

## 2015-07-31 NOTE — Patient Instructions (Signed)
Your physician has recommended you make the following change in your medication:  1)Coreg 12.5mg  take 1 pill twice a day

## 2015-08-09 ENCOUNTER — Inpatient Hospital Stay (HOSPITAL_COMMUNITY): Admission: RE | Admit: 2015-08-09 | Payer: Medicare Other | Source: Ambulatory Visit | Admitting: Nurse Practitioner

## 2015-08-11 ENCOUNTER — Ambulatory Visit (HOSPITAL_COMMUNITY)
Admission: RE | Admit: 2015-08-11 | Discharge: 2015-08-11 | Disposition: A | Discharge status: Home / Self Care | Payer: Medicare Other | Source: Ambulatory Visit | Attending: Nurse Practitioner | Admitting: Nurse Practitioner

## 2015-08-11 ENCOUNTER — Encounter (HOSPITAL_COMMUNITY): Payer: Self-pay | Admitting: Nurse Practitioner

## 2015-08-11 VITALS — BP 132/62 | HR 57 | Ht 65.0 in | Wt 135.0 lb

## 2015-08-11 DIAGNOSIS — E119 Type 2 diabetes mellitus without complications: Secondary | ICD-10-CM | POA: Insufficient documentation

## 2015-08-11 DIAGNOSIS — E785 Hyperlipidemia, unspecified: Secondary | ICD-10-CM | POA: Diagnosis not present

## 2015-08-11 DIAGNOSIS — I4891 Unspecified atrial fibrillation: Secondary | ICD-10-CM | POA: Diagnosis present

## 2015-08-11 DIAGNOSIS — R42 Dizziness and giddiness: Secondary | ICD-10-CM | POA: Diagnosis not present

## 2015-08-11 DIAGNOSIS — I4819 Other persistent atrial fibrillation: Secondary | ICD-10-CM

## 2015-08-11 DIAGNOSIS — I481 Persistent atrial fibrillation: Secondary | ICD-10-CM | POA: Diagnosis not present

## 2015-08-11 DIAGNOSIS — Z833 Family history of diabetes mellitus: Secondary | ICD-10-CM | POA: Diagnosis not present

## 2015-08-11 DIAGNOSIS — I739 Peripheral vascular disease, unspecified: Secondary | ICD-10-CM | POA: Insufficient documentation

## 2015-08-11 DIAGNOSIS — Z8249 Family history of ischemic heart disease and other diseases of the circulatory system: Secondary | ICD-10-CM | POA: Insufficient documentation

## 2015-08-11 DIAGNOSIS — I272 Other secondary pulmonary hypertension: Secondary | ICD-10-CM | POA: Diagnosis not present

## 2015-08-11 DIAGNOSIS — R600 Localized edema: Secondary | ICD-10-CM | POA: Diagnosis not present

## 2015-08-11 DIAGNOSIS — I10 Essential (primary) hypertension: Secondary | ICD-10-CM | POA: Insufficient documentation

## 2015-08-11 DIAGNOSIS — Z8673 Personal history of transient ischemic attack (TIA), and cerebral infarction without residual deficits: Secondary | ICD-10-CM | POA: Insufficient documentation

## 2015-08-11 DIAGNOSIS — G4730 Sleep apnea, unspecified: Secondary | ICD-10-CM | POA: Diagnosis not present

## 2015-08-11 DIAGNOSIS — Z79899 Other long term (current) drug therapy: Secondary | ICD-10-CM | POA: Diagnosis not present

## 2015-08-11 DIAGNOSIS — M810 Age-related osteoporosis without current pathological fracture: Secondary | ICD-10-CM | POA: Diagnosis not present

## 2015-08-11 DIAGNOSIS — G4733 Obstructive sleep apnea (adult) (pediatric): Secondary | ICD-10-CM | POA: Diagnosis not present

## 2015-08-11 MED ORDER — CARVEDILOL 6.25 MG PO TABS: 6.2500 mg | ORAL_TABLET | Freq: Two times a day (BID) | ORAL | Status: DC

## 2015-08-11 NOTE — Progress Notes (Signed)
Patient ID: Donna Owens, female   DOB: 01-08-1928, 81 y.o.   MRN: [id number]    Primary Care Physician: [name], MD Referring Physician: Rexford Maus Triage Cardiologist: Dr. Artist Beach is a 81 y.o. female with a h/o DM, HTN, PUL HTN, OSA,prior CVA, afib and frequent falls, last being  June of this year. Pt called into [facility] with c/o's of rt upper chest discomfort,(now resolved) dizziness and was asked to f/u with afib clinic. She has had two neurology visits, one end of June and another end of July for dizziness and falls. She was in the ED in April for a fall as well, thought to be due to dehydration. She was also urged to f/u sooner than September due to presence of irregular heartbeat/pedal edema with recent neuro appointment.     She states her balance is terrible and she stumbles all the time. Especially when she has to turn quickly, she will feel likely to fall. She has noticed increase in lower extremity edema over the last month. Ekg shows afib with slow ventricular response. She had similar findings on EKG one year ago, started on blood thinners and  after appropriate period of time had successful cardioversion.Blood thinners were d/ced in March due to falls.She may feel a few heart skpis but not really aware of afib.   On last visit, after discussion via staff message with Dr. Loletha Grayer, it was decided to keep pt off blood thinners due to poor balance and frequent falls, let her stay in afib but decrease BB to allow heart rate to come up. Pt did this for a few days but then was told by  PCP not to cut drug and a new rx would be sent in for 12.5 coreg bid. The new rx was not at drugstore when she went Friday pm, so for the remainder of the weekend and this am, she continued 25 mg bid,  heart rate is now 44 bpm. Surprisingly, pt is tolerating and says she doesn't not feel anymore lightheaded than usual. Pedal edema is better. She has been elevating feet when  sitting.  She now returns 8/19 and carvedilol is at 12.5 mg bid and heart rate is up to 57 bpm. She is feeling better  with more energy and is less lightheaded. Stumbling less. She has not had any falls. I would like to see v. Rate into the 60's so will  decrease carvedilol to 6.25 mg bid.   Today, she denies symptoms of  shortness of breath, orthopnea, PND. Positive for lower extremity edema, chronic dizziness, few palpitations. Frequent falls.The patient is tolerating medications without difficulties and is otherwise without complaint today.   Past Medical History  Diagnosis Date  . Diabetes mellitus   . Hypertension   . Cancer   . Hyperlipemia   . Pulmonary hypertension     moderate echo 11/07/10  . Carotid arterial disease 12/13/11     mild by doppler  . PVD (peripheral vascular disease)   . OSA (obstructive sleep apnea)   . Anemia   . Atrophic vaginitis   . Sleep apnea   . Osteoporosis   . CVA (cerebral vascular accident)   . Blood transfusion without reported diagnosis   . OSA on CPAP 06/20/2014   Past Surgical History  Procedure Laterality Date  . Breast surgery      right 07/26/2000  . Breast lumpectomy Right     chemo/radiation  . Breast lumpectomy Left  benign  . Colonoscopy  2010  . Ingrown toenails      removed in 03/2013  . Cardioversion N/A 07/21/2014    Procedure: CARDIOVERSION;  Surgeon: Sanda Klein, MD;  Location: [facility];  Service: Cardiovascular;  Laterality: N/A;    Current Outpatient Prescriptions  Medication Sig Dispense Refill  . acetaminophen (TYLENOL) 500 MG tablet Take 500 mg by mouth every 6 (six) hours as needed for moderate pain.    Marland Kitchen aspirin EC 81 MG tablet Take 1 tablet (81 mg total) by mouth daily. 30 tablet 11  . calcium-vitamin D (OSCAL WITH D) 500-200 MG-UNIT per tablet Take 1 tablet by mouth daily with breakfast.     . carvedilol (COREG) 6.25 MG tablet Take 1 tablet (6.25 mg total) by mouth 2 (two) times daily with a meal. 60  tablet 3  . Cholecalciferol (VITAMIN D-3) 1000 UNITS CAPS Take 1 capsule by mouth daily.    . Coenzyme Q10 (CO Q 10) 100 MG CAPS Take 1 capsule by mouth daily.    . hydrOXYzine (ATARAX/VISTARIL) 25 MG tablet Take 1 tablet (25 mg total) by mouth 3 (three) times daily as needed for itching. 30 tablet 2  . lisinopril (PRINIVIL,ZESTRIL) 20 MG tablet Take 2 tablets (40 mg total) by mouth daily. (Patient taking differently: Take 20 mg by mouth 2 (two) times daily. ) 180 tablet 3  . metFORMIN (GLUCOPHAGE-XR) 500 MG 24 hr tablet Take 500 mg by mouth 2 (two) times daily.    . multivitamin (METANX) 3-35-2 MG TABS tablet Take 1 tablet by mouth 2 (two) times daily. 60 tablet 11  . NIFEdipine (NIFEDICAL XL) 30 MG 24 hr tablet Take 1 tablet (30 mg total) by mouth daily. 90 tablet 1  . Polyethyl Glycol-Propyl Glycol (SYSTANE OP) Apply 1 drop to eye 2 (two) times daily. SYSTANE    . pravastatin (PRAVACHOL) 40 MG tablet Take 40 mg by mouth daily.    Marland Kitchen triamcinolone cream (KENALOG) 0.1 % Apply 1 application topically 2 (two) times daily. (Patient taking differently: Apply 1 application topically 2 (two) times daily as needed (rash/sore). On left foot.) 30 g 0  . vitamin B-12 (CYANOCOBALAMIN) 1000 MCG tablet Take 1,000 mcg by mouth 2 (two) times daily.     . diazepam (VALIUM) 5 MG tablet Take 1 tablet (5 mg total) by mouth every 8 (eight) hours as needed (dizziness). (Patient not taking: Reported on 08/11/2015) 5 tablet 0  . promethazine (PHENERGAN) 25 MG tablet Take 25 mg by mouth daily. nausea     No current facility-administered medications for this encounter.    No Known Allergies  Social History   Social History  . Marital Status: Divorced    Spouse Name: N/A  . Number of Children: 1  . Years of Education: BSN   Occupational History  . RETIRED    Social History Main Topics  . Smoking status: Never Smoker   . Smokeless tobacco: Never Used  . Alcohol Use: No  . Drug Use: No  . Sexual Activity: No     Other Topics Concern  . Not on file   Social History Narrative   Patient is divorced and lives alone.   Patient has one adult daughter.   Patient is retired.   Patient has a BSN degree in nursing.   Patient is right-handed.   Patient drinks one soda per week.    Family History  Problem Relation Age of Onset  . Diabetes Other   . Diabetes Brother   .  Hypertension Brother   . Heart failure Daughter 40    ROS- All systems are reviewed and negative except as per the HPI above  Physical Exam: Filed Vitals:   08/11/15 1118  BP: 132/62  Pulse: 57  Height: 5\' 5"  (1.651 m)  Weight: 135 lb (61.236 kg)    GEN- The patient is well appearing, alert and oriented x 3 today.   Head- normocephalic, atraumatic Eyes-  Sclera clear, conjunctiva pink Ears- hearing intact Oropharynx- clear Neck- supple, no JVP Lymph- no cervical lymphadenopathy Lungs- Clear to ausculation bilaterally, normal work of breathing Heart- Slow irregular rate and rhythm, no murmurs, rubs or gallops, PMI not laterally displaced GI- soft, NT, ND, + BS Extremities- no clubbing, cyanosis, or edema MS- no significant deformity or atrophy Skin- no rash or lesion Psych- euthymic mood, full affect Neuro- strength and sensation are intact  EKG- Afib with slow ventricular response at 44 bpm, QRS int, 74 ms, QTc 360 ms. Epic records reviewed  Assessment and Plan: 1. Afib with slow ventricular response Decrease coreg to 6.25 mg mg bid, with improvement seen in v. rate with decrease to 12.5 bid.  2. Lower extremity edema Could be multifactorial, afib could be playing into this. Not a candidate for diuretics, due to  h/o dehydration, falls. Elevate legs Avoid salt Compression socks Edema improved on this visit with elevating legs.  3. OSA Thought not to be a issue since pt has lost weight.  4. Falls/dizziness A cause for concern with blood thinners Previous dehydration Stay off blood thinners per  conversation with Dr. Loletha Grayer.  5. Chadsvasc score of at least 7 Concerning for CVA, due to fall risk and fear of bleeding, esp head injury, off blood thinners, continue ASA.  F/u with Dr. Loletha Grayer as scheduled 9/14 Afib clinic as needed  Geroge Baseman. Niesha Bame, [facility] [address] [place], [place] [address] [phone]

## 2015-08-11 NOTE — Patient Instructions (Signed)
Your physician has recommended you make the following change in your medication:  1)Coreg 6.25mg  twice a day

## 2015-08-17 DIAGNOSIS — I739 Peripheral vascular disease, unspecified: Secondary | ICD-10-CM | POA: Diagnosis not present

## 2015-08-17 DIAGNOSIS — R269 Unspecified abnormalities of gait and mobility: Secondary | ICD-10-CM | POA: Diagnosis not present

## 2015-08-17 DIAGNOSIS — Z6821 Body mass index (BMI) 21.0-21.9, adult: Secondary | ICD-10-CM | POA: Diagnosis not present

## 2015-08-17 DIAGNOSIS — I4892 Unspecified atrial flutter: Secondary | ICD-10-CM | POA: Diagnosis not present

## 2015-08-17 DIAGNOSIS — E1151 Type 2 diabetes mellitus with diabetic peripheral angiopathy without gangrene: Secondary | ICD-10-CM | POA: Diagnosis not present

## 2015-09-05 DIAGNOSIS — H04123 Dry eye syndrome of bilateral lacrimal glands: Secondary | ICD-10-CM | POA: Diagnosis not present

## 2015-09-06 ENCOUNTER — Other Ambulatory Visit: Payer: Self-pay | Admitting: Cardiovascular Disease

## 2015-09-06 ENCOUNTER — Ambulatory Visit (INDEPENDENT_AMBULATORY_CARE_PROVIDER_SITE_OTHER): Payer: Medicare Other | Admitting: Cardiovascular Disease

## 2015-09-06 ENCOUNTER — Encounter: Payer: Self-pay | Admitting: Cardiovascular Disease

## 2015-09-06 VITALS — BP 118/60 | HR 60 | Resp 16 | Ht 65.0 in | Wt 130.7 lb

## 2015-09-06 DIAGNOSIS — Z9989 Dependence on other enabling machines and devices: Secondary | ICD-10-CM

## 2015-09-06 DIAGNOSIS — I10 Essential (primary) hypertension: Secondary | ICD-10-CM

## 2015-09-06 DIAGNOSIS — I739 Peripheral vascular disease, unspecified: Secondary | ICD-10-CM | POA: Diagnosis not present

## 2015-09-06 DIAGNOSIS — I483 Typical atrial flutter: Secondary | ICD-10-CM

## 2015-09-06 DIAGNOSIS — G4733 Obstructive sleep apnea (adult) (pediatric): Secondary | ICD-10-CM

## 2015-09-06 DIAGNOSIS — E785 Hyperlipidemia, unspecified: Secondary | ICD-10-CM | POA: Diagnosis not present

## 2015-09-06 MED ORDER — FUROSEMIDE 20 MG PO TABS: ORAL_TABLET | ORAL | Status: DC

## 2015-09-06 NOTE — Patient Instructions (Addendum)
Your physician recommends that you schedule a follow-up appointment FIRST AVAILABLE with Dr. Sallyanne Kuster.  Your physician has recommended you make the following change in your medication: STOP NIFEDIPINE. Start new prescription for furosemide as directed on the bottle. This has already been sent to your pharmacy.

## 2015-09-06 NOTE — Progress Notes (Signed)
Patient ID: Donna Owens, female   DOB: [birth year], 81 y.o.   MRN: [id number]     Cardiology Office Note   Date:  09/08/2015   ID:  Donna Owens, DOB 18-Mar-1928, MRN [id number]  PCP:  Donna Lopes, MD  Cardiologist:   Sanda Klein, MD   No chief complaint on file.     History of Present Illness: Donna Owens is a 81 y.o. female who presents for  Persistent atrial fibrillation , hypertension , hyperlipidemia and a previous history of obstructive sleep apnea that seems to have resolved after losing a lot of weight.  She remains very unsteady on her feet. She has fallen twice just in the last week , once while simply trying to sit in a chair. Due to her frequent falls she is no longer taking anticoagulant therapy. She complains of lower extremity edema and her blood pressure has frequently been low. Her heart rate is also quite slow and multiple medication adjustments have been recently made in the atrial fibrillation clinic.  It is quite likely that her weight loss has a lot to do with lower need for medications.  She has preserved left ventricular systolic function and moderate LVH by echo. She had moderate tricuspid regurgitation, moderate pulmonary hypertension and a moderately dilated right atrium with preserved right ventricular function. Right heart enlargement and pulmonary hypertension are possibly related to obstructive sleep apnea which has been intermittently treated with CPAP since 2011.  Most recent evaluation suggests that sleep apnea has resolved following weight loss.She has a previous history of right breast lumpectomy with chemotherapy and radiation.  Past Medical History  Diagnosis Date  . Diabetes mellitus   . Hypertension   . Cancer   . Hyperlipemia   . Pulmonary hypertension     moderate echo 11/07/10  . Carotid arterial disease 12/13/11     mild by doppler  . PVD (peripheral vascular disease)   . OSA (obstructive sleep apnea)   . Anemia   . Atrophic  vaginitis   . Sleep apnea   . Osteoporosis   . CVA (cerebral vascular accident)   . Blood transfusion without reported diagnosis   . OSA on CPAP 06/20/2014    Past Surgical History  Procedure Laterality Date  . Breast surgery      right 07/26/2000  . Breast lumpectomy Right     chemo/radiation  . Breast lumpectomy Left     benign  . Colonoscopy  2010  . Ingrown toenails      removed in 03/2013  . Cardioversion N/A 07/21/2014    Procedure: CARDIOVERSION;  Surgeon: Sanda Klein, MD;  Location: [facility];  Service: Cardiovascular;  Laterality: N/A;     Current Outpatient Prescriptions  Medication Sig Dispense Refill  . acetaminophen (TYLENOL) 500 MG tablet Take 500 mg by mouth every 6 (six) hours as needed for moderate pain.    Marland Kitchen aspirin EC 81 MG tablet Take 1 tablet (81 mg total) by mouth daily. 30 tablet 11  . calcium-vitamin D (OSCAL WITH D) 500-200 MG-UNIT per tablet Take 1 tablet by mouth daily with breakfast.     . carvedilol (COREG) 6.25 MG tablet Take 1 tablet (6.25 mg total) by mouth 2 (two) times daily with a meal. 60 tablet 3  . Cholecalciferol (VITAMIN D-3) 1000 UNITS CAPS Take 1 capsule by mouth daily.    . Coenzyme Q10 (CO Q 10) 100 MG CAPS Take 1 capsule by mouth daily.    . diazepam (VALIUM) 5  MG tablet Take 1 tablet (5 mg total) by mouth every 8 (eight) hours as needed (dizziness). 5 tablet 0  . hydrOXYzine (ATARAX/VISTARIL) 25 MG tablet Take 1 tablet (25 mg total) by mouth 3 (three) times daily as needed for itching. 30 tablet 2  . lisinopril (PRINIVIL,ZESTRIL) 20 MG tablet Take 2 tablets (40 mg total) by mouth daily. (Patient taking differently: Take 20 mg by mouth 2 (two) times daily. ) 180 tablet 3  . metFORMIN (GLUCOPHAGE-XR) 500 MG 24 hr tablet Take 500 mg by mouth 2 (two) times daily.    . multivitamin (METANX) 3-35-2 MG TABS tablet Take 1 tablet by mouth 2 (two) times daily. 60 tablet 11  . Polyethyl Glycol-Propyl Glycol (SYSTANE OP) Apply 1 drop to eye 2  (two) times daily. SYSTANE    . pravastatin (PRAVACHOL) 40 MG tablet Take 40 mg by mouth daily.    . promethazine (PHENERGAN) 25 MG tablet Take 25 mg by mouth daily. nausea    . triamcinolone cream (KENALOG) 0.1 % Apply 1 application topically 2 (two) times daily. (Patient taking differently: Apply 1 application topically 2 (two) times daily as needed (rash/sore). On left foot.) 30 g 0  . vitamin B-12 (CYANOCOBALAMIN) 1000 MCG tablet Take 1,000 mcg by mouth 2 (two) times daily.     . furosemide (LASIX) 20 MG tablet Take 1 tablet on Wednesday and Saturday 10 tablet 6   No current facility-administered medications for this visit.    Allergies:   Review of patient's allergies indicates no known allergies.    Social History:  The patient  reports that she has never smoked. She has never used smokeless tobacco. She reports that she does not drink alcohol or use illicit drugs.   Family History:  The patient's family history includes Diabetes in her brother and other; Heart failure (age of onset: 65) in her daughter; Hypertension in her brother.    ROS:  Please see the history of present illness.    Otherwise, review of systems positive for  Frequent falls that occur even when she simply tries to turn around or sit down in a chair.   All other systems are reviewed and negative.    PHYSICAL EXAM: VS:  BP 118/60 mmHg  Pulse 60  Resp 16  Ht 5\' 5"  (1.651 m)  Wt 130 lb 11.2 oz (59.285 kg)  BMI 21.75 kg/m2 , BMI Body mass index is 21.75 kg/(m^2).  General: Alert, oriented x3, no distress Head: no evidence of trauma, PERRL, EOMI, no exophtalmos or lid lag, no myxedema, no xanthelasma; normal ears, nose and oropharynx Neck: normal jugular venous pulsations and no hepatojugular reflux; brisk carotid pulses without delay and no carotid bruits Chest: clear to auscultation, no signs of consolidation by percussion or palpation, normal fremitus, symmetrical and full respiratory  excursions Cardiovascular: normal position and quality of the apical impulse, irregular rhythm, normal first and second heart sounds, no  murmurs, rubs or gallops Abdomen: no tenderness or distention, no masses by palpation, no abnormal pulsatility or arterial bruits, normal bowel sounds, no hepatosplenomegaly Extremities: no clubbing, cyanosis or edema; 2+ radial, ulnar and brachial pulses bilaterally; 2+ right femoral, posterior tibial and dorsalis pedis pulses; 2+ left femoral, posterior tibial and dorsalis pedis pulses; no subclavian or femoral bruits Neurological: grossly nonfocal Psych: euthymic mood, full affect   EKG:  EKG is not ordered today.   Recent Labs: 07/21/2015: ALT 17; BUN 21*; Creatinine, Ser 1.34*; Hemoglobin 11.6*; Platelets 176; Potassium 4.3; Sodium 139; TSH  0.863    Lipid Panel    Component Value Date/Time   CHOL 139 02/02/2014 1106   TRIG 47 02/02/2014 1106   HDL 54 02/02/2014 1106   CHOLHDL 2.6 02/02/2014 1106   VLDL 9 02/02/2014 1106   LDLCALC 76 02/02/2014 1106      Wt Readings from Last 3 Encounters:  09/06/15 130 lb 11.2 oz (59.285 kg)  08/11/15 135 lb (61.236 kg)  07/31/15 137 lb 3.2 oz (62.234 kg)     ASSESSMENT AND PLAN:  1.  Persistent atrial fibrillation with controlled ventricular response. Ventricular rate seems to be acceptable on the current much lower dose of carvedilol. She is not a good candidate for anticoagulation (and therefore not a candidate for cardioversion ) due to frequent falls.  2. History of pulmonary hypertension and right heart enlargement, presumably due to obstructive sleep apnea which is now felt to be resolved.  3. Hypertensive heart disease with left ventricular hypertrophy and diastolic heart failure.  She has evidence of edema. Diuretics have been curtailed due to presumed problems with dehydration as a cause of her falls.  We'll discontinue nifedipine since her blood pressure is very well controlled. This may help  resolution of the edema.  We'll also start with a very low dose of diuretics, administered just twice weekly for now. The dose can be titrated for resolution of edema.  4.  Progressive weight loss,  Now borderline underweight    Current medicines are reviewed at length with the patient today.  The patient has concerns regarding medicines.   Labs/ tests ordered today include:  No orders of the defined types were placed in this encounter.    Patient Instructions  Your physician recommends that you schedule a follow-up appointment FIRST AVAILABLE with Dr. Sallyanne Kuster.  Your physician has recommended you make the following change in your medication: STOP NIFEDIPINE. Start new prescription for furosemide as directed on the bottle. This has already been sent to your pharmacy.     Mikael Spray, MD  09/08/2015 11:25 AM    Sanda Klein, MD, [facility] [phone] office [phone] pager

## 2015-09-08 ENCOUNTER — Encounter: Payer: Self-pay | Admitting: Cardiovascular Disease

## 2015-09-21 ENCOUNTER — Telehealth: Payer: Self-pay | Admitting: Cardiovascular Disease

## 2015-09-21 DIAGNOSIS — I10 Essential (primary) hypertension: Secondary | ICD-10-CM

## 2015-09-21 DIAGNOSIS — Z23 Encounter for immunization: Secondary | ICD-10-CM | POA: Diagnosis not present

## 2015-09-21 MED ORDER — FUROSEMIDE 20 MG PO TABS: 20.0000 mg | ORAL_TABLET | ORAL | Status: DC

## 2015-09-21 NOTE — Telephone Encounter (Signed)
Increase furosemide to 4 days weekly , 20 mg each time  might need an updated prescription BMET 2 weeks please

## 2015-09-21 NOTE — Telephone Encounter (Signed)
Called patient.  Informed her of increase in Lasix frequency to Lasix 20 mg four days a week.  New RX sent to pharmacy  BMET ordered.  Patient will have lab drawn 10/13

## 2015-09-21 NOTE — Telephone Encounter (Signed)
Patient states she is taking generic Lasix 20 mg on Wednesday and Saturday for the swelling in her legs.  It does not seem to be helping.  Does the dosage need to be increased before she refills her prescription?  Please advise.

## 2015-09-21 NOTE — Telephone Encounter (Signed)
Returned patient phone call  Patient said that the swelling in her legs up to mid calf have gone down some taking lasix 20 mg on Wednesday and Saturday but not much.  Wonders if she needs to have her lasix dose increased.  Does not monitor weight, BP, P at home.  Watches salt intake in her diet

## 2015-10-05 ENCOUNTER — Telehealth: Payer: Self-pay | Admitting: Cardiovascular Disease

## 2015-10-05 ENCOUNTER — Other Ambulatory Visit: Payer: Self-pay | Admitting: *Deleted

## 2015-10-05 DIAGNOSIS — I10 Essential (primary) hypertension: Secondary | ICD-10-CM | POA: Diagnosis not present

## 2015-10-05 LAB — BASIC METABOLIC PANEL
BUN: 20 mg/dL (ref 7–25)
CO2: 27 mmol/L (ref 20–31)
Calcium: 9.2 mg/dL (ref 8.6–10.4)
Chloride: 104 mmol/L (ref 98–110)
Creat: 1.23 mg/dL — ABNORMAL HIGH (ref 0.60–0.88)
Glucose, Bld: 78 mg/dL (ref 65–99)
POTASSIUM: 4.3 mmol/L (ref 3.5–5.3)
SODIUM: 140 mmol/L (ref 135–146)

## 2015-10-05 NOTE — Telephone Encounter (Signed)
Needs a Lab order . Patient is at the lab now

## 2015-10-05 NOTE — Telephone Encounter (Signed)
BMP future order released electronically, Katrina @ [facility] aware.

## 2015-10-16 ENCOUNTER — Encounter: Payer: Self-pay | Admitting: Cardiovascular Disease

## 2015-10-16 ENCOUNTER — Ambulatory Visit (INDEPENDENT_AMBULATORY_CARE_PROVIDER_SITE_OTHER): Payer: Medicare Other | Admitting: Cardiovascular Disease

## 2015-10-16 VITALS — BP 156/66 | HR 52 | Ht 66.0 in | Wt 130.0 lb

## 2015-10-16 DIAGNOSIS — I10 Essential (primary) hypertension: Secondary | ICD-10-CM

## 2015-10-16 MED ORDER — CARVEDILOL 3.125 MG PO TABS: 3.1250 mg | ORAL_TABLET | Freq: Two times a day (BID) | ORAL | Status: DC

## 2015-10-16 MED ORDER — FUROSEMIDE 20 MG PO TABS: 20.0000 mg | ORAL_TABLET | ORAL | Status: DC

## 2015-10-16 MED ORDER — APIXABAN 2.5 MG PO TABS: 2.5000 mg | ORAL_TABLET | Freq: Two times a day (BID) | ORAL | Status: DC

## 2015-10-16 NOTE — Progress Notes (Signed)
Patient Donna Owens: Donna Owens, female   DOB: 03-Apr-1928, 81 y.o.   MRN: [id number]     Cardiology Office Note   Date:  10/16/2015   Donna Owens:  Donna Owens, DOB October 15, 1928, MRN [id number]  PCP:  [name], MD  Cardiologist:   Sanda Klein, MD   Chief Complaint  Patient presents with  . FOLLOW UP FOR AFIB  . Chest Pain  . Dizziness  . Edema    FEET, LEGS, AND ANKLES      History of Present Illness: Donna Owens is a 81 y.o. female who presents for follow-up for persistent atrial fibrillation with slow ventricular response, diastolic heart failure and systemic hypertension.  She continues to have worsening problems with lower extremity neuropathy. The pain is worse when she lies in bed, not when she walks. Her gait remains unsteady but she has had fewer falls. She is more careful and uses a walker. She has not fallen the last 6 months. She has not had any bleeding complications. She also denies focal neurological deficits.  Her weight is stable since the last appointment and appears that her rapid weight loss has stopped, however she also has some leg edema today and it is hard to say what her "optimal dry weight" is. She is currently only taking the furosemide twice weekly on Wednesdays and Saturdays.  She continues to complain of fatigue and her heart rate is quite slow despite the lower dose of carvedilol. Her blood pressure was a little high on arrival, still a little high at 142/60 mmHg later during the appointment. She has noted that her diastolic blood pressure tends to run quite low.  She has a remote history of ischemic stroke but anticoagulation was held after she had several falls. The dose of beta blocker has been progressively reduced due to slow ventricular response.    Past Medical History  Diagnosis Date  . Diabetes mellitus   . Hypertension   . Cancer ([place])   . Hyperlipemia   . Pulmonary hypertension ([place])     moderate echo 11/07/10  . Carotid arterial  disease ([place]) 12/13/11     mild by doppler  . PVD (peripheral vascular disease) ([place])   . OSA (obstructive sleep apnea)   . Anemia   . Atrophic vaginitis   . Sleep apnea   . Osteoporosis   . CVA (cerebral vascular accident) ([place])   . Blood transfusion without reported diagnosis   . OSA on CPAP 06/20/2014    Past Surgical History  Procedure Laterality Date  . Breast surgery      right 07/26/2000  . Breast lumpectomy Right     chemo/radiation  . Breast lumpectomy Left     benign  . Colonoscopy  2010  . Ingrown toenails      removed in 03/2013  . Cardioversion N/A 07/21/2014    Procedure: CARDIOVERSION;  Surgeon: Sanda Klein, MD;  Location: [facility];  Service: Cardiovascular;  Laterality: N/A;     Current Outpatient Prescriptions  Medication Sig Dispense Refill  . acetaminophen (TYLENOL) 500 MG tablet Take 500 mg by mouth every 6 (six) hours as needed for moderate pain.    . calcium-vitamin D (OSCAL WITH D) 500-200 MG-UNIT per tablet Take 1 tablet by mouth daily with breakfast.     . carvedilol (COREG) 3.125 MG tablet Take 1 tablet (3.125 mg total) by mouth 2 (two) times daily with a meal. 180 tablet 1  . Cholecalciferol (VITAMIN D-3) 1000 UNITS CAPS Take 1  capsule by mouth daily.    . Coenzyme Q10 (CO Q 10) 100 MG CAPS Take 1 capsule by mouth daily.    . diazepam (VALIUM) 5 MG tablet Take 1 tablet (5 mg total) by mouth every 8 (eight) hours as needed (dizziness). 5 tablet 0  . hydrOXYzine (ATARAX/VISTARIL) 25 MG tablet Take 1 tablet (25 mg total) by mouth 3 (three) times daily as needed for itching. 30 tablet 2  . lisinopril (PRINIVIL,ZESTRIL) 20 MG tablet Take 2 tablets (40 mg total) by mouth daily. (Patient taking differently: Take 20 mg by mouth 2 (two) times daily. ) 180 tablet 3  . metFORMIN (GLUCOPHAGE-XR) 500 MG 24 hr tablet Take 500 mg by mouth 2 (two) times daily.    Vladimir Faster Glycol-Propyl Glycol (SYSTANE OP) Apply 1 drop to eye 2 (two) times daily. SYSTANE    .  pravastatin (PRAVACHOL) 40 MG tablet Take 40 mg by mouth daily.    . promethazine (PHENERGAN) 25 MG tablet Take 25 mg by mouth daily. nausea    . triamcinolone cream (KENALOG) 0.1 % Apply 1 application topically 2 (two) times daily. (Patient taking differently: Apply 1 application topically 2 (two) times daily as needed (rash/sore). On left foot.) 30 g 0  . vitamin B-12 (CYANOCOBALAMIN) 1000 MCG tablet Take 1,000 mcg by mouth 2 (two) times daily.     Marland Kitchen apixaban (ELIQUIS) 2.5 MG TABS tablet Take 1 tablet (2.5 mg total) by mouth 2 (two) times daily. 180 tablet 1  . furosemide (LASIX) 20 MG tablet Take 1 tablet (20 mg total) by mouth 4 (four) times a week. TAKE ONE TABLET BY MOUTH MONDAYS/WEDNESDAY/FRIDAYS AND SATURDAYS 90 tablet 3   No current facility-administered medications for this visit.    Allergies:   Review of patient's allergies indicates no known allergies.    Social History:  The patient  reports that she has never smoked. She has never used smokeless tobacco. She reports that she does not drink alcohol or use illicit drugs.   Family History:  The patient's family history includes Diabetes in her brother and other; Heart failure (age of onset: 15) in her daughter; Hypertension in her brother.    ROS:  Please see the history of present illness.    Otherwise, review of systems positive for none.   All other systems are reviewed and negative.    PHYSICAL EXAM: VS:  BP 156/66 mmHg  Pulse 52  Ht 5\' 6"  (1.676 m)  Wt 130 lb (58.968 kg)  BMI 20.99 kg/m2 , BMI Body mass index is 20.99 kg/(m^2).  General: Alert, oriented x3, no distress Head: no evidence of trauma, PERRL, EOMI, no exophtalmos or lid lag, no myxedema, no xanthelasma; normal ears, nose and oropharynx Neck: normal jugular venous pulsations and no hepatojugular reflux; brisk carotid pulses without delay and no carotid bruits Chest: clear to auscultation, no signs of consolidation by percussion or palpation, normal  fremitus, symmetrical and full respiratory excursions Cardiovascular: normal position and quality of the apical impulse, irregular rhythm, normal first and second heart sounds, no murmurs, rubs or gallops Abdomen: no tenderness or distention, no masses by palpation, no abnormal pulsatility or arterial bruits, normal bowel sounds, no hepatosplenomegaly Extremities: no clubbing, cyanosis; 1-2+ pretibial edema; 2+ radial, ulnar and brachial pulses bilaterally; 2+ right femoral, posterior tibial and dorsalis pedis pulses; 2+ left femoral, posterior tibial and dorsalis pedis pulses; no subclavian or femoral bruits Neurological: grossly nonfocal Psych: euthymic mood, full affect   EKG:  EKG is ordered  today. The ekg ordered today demonstrates atrial fibrillation with slow ventricular response, poor R-wave progression, no ST segment changes   Recent Labs: 07/21/2015: ALT 17; Hemoglobin 11.6*; Platelets 176; TSH 0.863 10/05/2015: BUN 20; Creat 1.23*; Potassium 4.3; Sodium 140    Lipid Panel    Component Value Date/Time   CHOL 139 02/02/2014 1106   TRIG 47 02/02/2014 1106   HDL 54 02/02/2014 1106   CHOLHDL 2.6 02/02/2014 1106   VLDL 9 02/02/2014 1106   LDLCALC 76 02/02/2014 1106      Wt Readings from Last 3 Encounters:  10/16/15 130 lb (58.968 kg)  09/06/15 130 lb 11.2 oz (59.285 kg)  08/11/15 135 lb (61.236 kg)      Other studies Reviewed: Additional studies/ records that were reviewed today include: is from atrial fibrillation clinic.   ASSESSMENT AND PLAN:  1. Atrial fibrillation with slow ventricular response. Will decrease the carvedilol further to 3.125 mg twice a day. She has very high embolic risk, review stroke, CHADSVasc 16 (age 17, CVA 2, gender, HTN, CHF, DM). Now that she seems to have fewer falls and low risk for injury, would restart Eliquis in a dose adjusted for advanced age and low body mass  2. Diastolic heart failure, chronic with some signs of hypervolemia.  Increase furosemide to 4 days weekly  3. Systemic hypertension, essential with tendency to diastolic hypotension. Reevaluate blood pressure in several weeks on the new regimen of diuretics/beta blocker    Current medicines are reviewed at length with the patient today.  The patient does not have concerns regarding medicines.   Labs/ tests ordered today include:  Orders Placed This Encounter  Procedures  . EKG 12-Lead      Patient Instructions  Your physician has recommended you make the following change in your medication:   STOP ASPIRIN  START ELIQUIS 2.5MG  TWICE DAILY  DECREASE CARVEDILOL TO 3.125MG  TWICE DAILY  TAKE THE FUROSEMIDE 20MG  4 TIMES WEEKLY  -  MONDAYS/WEDNESDAYS/FRIDAYS/SATURDAYS  SOMEONE WILL BE CONTACTING YOU WITH THE HEART FAILURE CLINIC TO BE SEEN IN ABOUT 6 WEEKS.  Dr. Sallyanne Kuster recommends that you schedule a follow-up appointment in: [place], Elohim Brune, MD  10/16/2015 1:43 PM    Sanda Klein, MD, [facility] [phone] office [phone] pager

## 2015-10-16 NOTE — Patient Instructions (Addendum)
Your physician has recommended you make the following change in your medication:   STOP ASPIRIN  START ELIQUIS 2.5MG  TWICE DAILY  DECREASE CARVEDILOL TO 3.125MG  TWICE DAILY  TAKE THE FUROSEMIDE 20MG  4 TIMES WEEKLY  -  MONDAYS/WEDNESDAYS/FRIDAYS/SATURDAYS  SOMEONE WILL BE CONTACTING YOU WITH THE ATRIAL FIB CLINIC TO BE SEEN IN ABOUT 6 WEEKS.  Dr. Sallyanne Kuster recommends that you schedule a follow-up appointment in: 3 MONTHS

## 2015-10-24 ENCOUNTER — Encounter: Payer: Self-pay | Admitting: Podiatry

## 2015-10-24 ENCOUNTER — Ambulatory Visit (INDEPENDENT_AMBULATORY_CARE_PROVIDER_SITE_OTHER): Payer: Medicare Other | Admitting: Podiatry

## 2015-10-24 DIAGNOSIS — B351 Tinea unguium: Secondary | ICD-10-CM

## 2015-10-24 DIAGNOSIS — M79673 Pain in unspecified foot: Secondary | ICD-10-CM | POA: Diagnosis not present

## 2015-10-24 NOTE — Progress Notes (Addendum)
Patient ID: Donna Owens, female   DOB: 07-19-1928, 81 y.o.   MRN: [id number] Complaint:  Visit Type: Patient returns to my office for continued preventative foot care services. Complaint: Patient states" my nails have grown long and thick and become painful to walk and wear shoes" Patient has been diagnosed with DM .Marland Kitchen The patient presents for preventative foot care services. No changes to ROS.  She says she is experiencing pain at the outside left foot at the midfoot area.  Podiatric Exam: Vascular: dorsalis pedis and posterior tibial pulses are palpable bilateral. Capillary return is immediate. Temperature gradient is WNL. Skin turgor WNL  Sensorium: Normal Semmes Weinstein monofilament test. Normal tactile sensation bilaterally. Nail Exam: Pt has thick disfigured discolored nails with subungual debris noted bilateral entire nail hallux through fifth toenails.  She has pain medial border left hallux in the absence of infection. Ulcer Exam: There is no evidence of ulcer or pre-ulcerative changes or infection. Orthopedic Exam: Muscle tone and strength are WNL. No limitations in general ROM. No crepitus or effusions noted. Foot type and digits show no abnormalities. Bony prominences are unremarkable at the fifth metabase B/L.   Lower leg swelling noted with venous stasis.Marland Kitchen No swelling at this time at this visit. Skin: No Porokeratosis. No infection or ulcers  Diagnosis:  Onychomycosis, , Pain in right toe, pain in left toes  Treatment & Plan Procedures and Treatment: Consent by patient was obtained for treatment procedures. The patient understood the discussion of treatment and procedures well. All questions were answered thoroughly reviewed. Debridement of mycotic and hypertrophic toenails, 1 through 5 bilateral and clearing of subungual debris. No ulceration, no infection noted.  Return Visit-Office Procedure: Patient instructed to return to the office for a follow up visit 3 months for continued  evaluation and treatment.

## 2015-10-26 ENCOUNTER — Encounter: Payer: Self-pay | Admitting: Cardiovascular Disease

## 2015-11-10 ENCOUNTER — Other Ambulatory Visit: Payer: Self-pay | Admitting: Cardiology

## 2015-11-20 DIAGNOSIS — I10 Essential (primary) hypertension: Secondary | ICD-10-CM | POA: Diagnosis not present

## 2015-11-20 DIAGNOSIS — R269 Unspecified abnormalities of gait and mobility: Secondary | ICD-10-CM | POA: Diagnosis not present

## 2015-11-20 DIAGNOSIS — E1151 Type 2 diabetes mellitus with diabetic peripheral angiopathy without gangrene: Secondary | ICD-10-CM | POA: Diagnosis not present

## 2015-11-20 DIAGNOSIS — C50912 Malignant neoplasm of unspecified site of left female breast: Secondary | ICD-10-CM | POA: Diagnosis not present

## 2015-11-20 DIAGNOSIS — R51 Headache: Secondary | ICD-10-CM | POA: Diagnosis not present

## 2015-11-20 DIAGNOSIS — I739 Peripheral vascular disease, unspecified: Secondary | ICD-10-CM | POA: Diagnosis not present

## 2015-11-20 DIAGNOSIS — Z6820 Body mass index (BMI) 20.0-20.9, adult: Secondary | ICD-10-CM | POA: Diagnosis not present

## 2015-11-21 ENCOUNTER — Telehealth: Payer: Self-pay | Admitting: Cardiovascular Disease

## 2015-11-21 ENCOUNTER — Encounter: Payer: Self-pay | Admitting: Cardiovascular Disease

## 2015-11-21 NOTE — Telephone Encounter (Signed)
Letter in [place]. Please fax to Dr. Buel Ream.

## 2015-11-21 NOTE — Telephone Encounter (Signed)
2nd attempt to reach dentist office w/ no answer.

## 2015-11-21 NOTE — Telephone Encounter (Signed)
1. What dental office are you calling from? (pt is calling in) Dr. Randall Hiss McCullum   2. What is your office phone and fax number? Telephone - [phone]  3. What type of procedure is the patient having performed? Possible tooth extraction    4. What date is procedure scheduled? She has an appt on 12/1   5. What is your question (ex. Antibiotics prior to procedure, holding medication-we need to know how long dentist wants pt to hold med)? Will she need to hold her Eliquis and for how long? 6.

## 2015-11-21 NOTE — Telephone Encounter (Signed)
Returned call to patient.She stated she needs to know if she needs a antibiotic and does she need to hold Eliquis before she has 2 teeth extracted.Message sent to Dr.Croitoru for advice.

## 2015-11-21 NOTE — Telephone Encounter (Signed)
Attempt to reach dentist office via number provided, rings w/ no answer or automated response.

## 2015-11-22 NOTE — Telephone Encounter (Signed)
Letter faxed to [place] at fax # [phone].

## 2015-11-24 ENCOUNTER — Ambulatory Visit (HOSPITAL_COMMUNITY)
Admission: RE | Admit: 2015-11-24 | Discharge: 2015-11-24 | Disposition: A | Discharge status: Home / Self Care | Payer: Medicare Other | Source: Ambulatory Visit | Attending: Nurse Practitioner | Admitting: Nurse Practitioner

## 2015-11-24 VITALS — BP 148/80 | HR 70 | Ht 65.0 in | Wt 127.4 lb

## 2015-11-24 DIAGNOSIS — G4733 Obstructive sleep apnea (adult) (pediatric): Secondary | ICD-10-CM | POA: Insufficient documentation

## 2015-11-24 DIAGNOSIS — I4892 Unspecified atrial flutter: Secondary | ICD-10-CM

## 2015-11-24 DIAGNOSIS — I4891 Unspecified atrial fibrillation: Secondary | ICD-10-CM | POA: Diagnosis not present

## 2015-11-24 DIAGNOSIS — R600 Localized edema: Secondary | ICD-10-CM | POA: Insufficient documentation

## 2015-11-24 DIAGNOSIS — R42 Dizziness and giddiness: Secondary | ICD-10-CM | POA: Diagnosis not present

## 2015-11-24 NOTE — Progress Notes (Signed)
Patient ID: Donna Owens, female   DOB: 11-24-1928, 81 y.o.   MRN: [id number]    Primary Care Physician: [name], MD Referring Physician: Rexford Maus Triage Cardiologist: Dr. Artist Beach is a 81 y.o. female with a h/o DM, HTN, PUL HTN, OSA,prior CVA, afib and frequent falls, last being  June of this year.  She has had two neurology visits, one end of June and another end of July for dizziness and falls. She was in the ED in April for a fall as well, thought to be due to dehydration. She was also urged to f/u sooner than September due to presence of irregular heartbeat/pedal edema with recent neuro appointment.     She states her balance is terrible and she stumbles all the time. Especially when she has to turn quickly, she will feel likely to fall. She has noticed increase in lower extremity edema over the last month. Ekg showed afib with slow ventricular response. She had similar findings on EKG one year ago, started on blood thinners and  after appropriate period of time had successful cardioversion.Blood thinners were d/ced in March due to falls.She may feel a few heart skpis but not really aware of afib.   On last visit, after discussion via staff message with Dr. Loletha Grayer, it was decided to keep pt off blood thinners due to poor balance and frequent falls, let her stay in afib but decrease BB to allow heart rate to come up.  She returned 8/19 and carvedilol is at 12.5 mg bid and heart rate is up to 57 bpm. She is feeling better  with more energy and is less lightheaded. Stumbling less. She has not had any falls. I would like to see v. Rate into the 60's so will  decrease carvedilol to 6.25 mg bid.   She is in the afib clinic today, 12/2, and doing well. Afib in the 70's, no further falls but balance is still not good. Has chronic dizziness. Dr. Loletha Grayer stopped nifedipine and started low dose lasix for edema and improved. BP acceptable.  Today, she denies symptoms of  shortness  of breath, orthopnea, PND. Positive for lower extremity edema, chronic dizziness, few palpitations. Frequent falls.The patient is tolerating medications without difficulties and is otherwise without complaint today.   Past Medical History  Diagnosis Date  . Diabetes mellitus   . Hypertension   . Cancer ([place])   . Hyperlipemia   . Pulmonary hypertension ([place])     moderate echo 11/07/10  . Carotid arterial disease ([place]) 12/13/11     mild by doppler  . PVD (peripheral vascular disease) ([place])   . OSA (obstructive sleep apnea)   . Anemia   . Atrophic vaginitis   . Sleep apnea   . Osteoporosis   . CVA (cerebral vascular accident) ([place])   . Blood transfusion without reported diagnosis   . OSA on CPAP 06/20/2014   Past Surgical History  Procedure Laterality Date  . Breast surgery      right 07/26/2000  . Breast lumpectomy Right     chemo/radiation  . Breast lumpectomy Left     benign  . Colonoscopy  2010  . Ingrown toenails      removed in 03/2013  . Cardioversion N/A 07/21/2014    Procedure: CARDIOVERSION;  Surgeon: Sanda Klein, MD;  Location: [facility];  Service: Cardiovascular;  Laterality: N/A;    Current Outpatient Prescriptions  Medication Sig Dispense Refill  . acetaminophen (TYLENOL) 500 MG  tablet Take 500 mg by mouth every 6 (six) hours as needed for moderate pain.    Marland Kitchen apixaban (ELIQUIS) 2.5 MG TABS tablet Take 1 tablet (2.5 mg total) by mouth 2 (two) times daily. 180 tablet 1  . calcium-vitamin D (OSCAL WITH D) 500-200 MG-UNIT per tablet Take 1 tablet by mouth daily with breakfast.     . carvedilol (COREG) 3.125 MG tablet Take 1 tablet (3.125 mg total) by mouth 2 (two) times daily with a meal. 180 tablet 1  . Cholecalciferol (VITAMIN D-3) 1000 UNITS CAPS Take 1 capsule by mouth daily.    . Coenzyme Q10 (CO Q 10) 100 MG CAPS Take 1 capsule by mouth daily.    . diazepam (VALIUM) 5 MG tablet Take 1 tablet (5 mg total) by mouth every 8 (eight) hours as needed (dizziness).  5 tablet 0  . furosemide (LASIX) 20 MG tablet Take 1 tablet (20 mg total) by mouth 4 (four) times a week. TAKE ONE TABLET BY MOUTH MONDAYS/WEDNESDAY/FRIDAYS AND SATURDAYS 90 tablet 3  . hydrOXYzine (ATARAX/VISTARIL) 25 MG tablet Take 1 tablet (25 mg total) by mouth 3 (three) times daily as needed for itching. 30 tablet 2  . lisinopril (PRINIVIL,ZESTRIL) 20 MG tablet TAKE 2 TABLETS BY MOUTH DAILY 180 tablet 3  . metFORMIN (GLUCOPHAGE-XR) 500 MG 24 hr tablet Take 500 mg by mouth 2 (two) times daily.    Vladimir Faster Glycol-Propyl Glycol (SYSTANE OP) Apply 1 drop to eye 2 (two) times daily. SYSTANE    . pravastatin (PRAVACHOL) 40 MG tablet Take 40 mg by mouth daily.    . promethazine (PHENERGAN) 25 MG tablet Take 25 mg by mouth daily. nausea    . triamcinolone cream (KENALOG) 0.1 % Apply 1 application topically 2 (two) times daily. (Patient taking differently: Apply 1 application topically 2 (two) times daily as needed (rash/sore). On left foot.) 30 g 0  . vitamin B-12 (CYANOCOBALAMIN) 1000 MCG tablet Take 1,000 mcg by mouth 2 (two) times daily.      No current facility-administered medications for this encounter.    No Known Allergies  Social History   Social History  . Marital Status: Divorced    Spouse Name: N/A  . Number of Children: 1  . Years of Education: BSN   Occupational History  . RETIRED    Social History Main Topics  . Smoking status: Never Smoker   . Smokeless tobacco: Never Used  . Alcohol Use: No  . Drug Use: No  . Sexual Activity: No   Other Topics Concern  . Not on file   Social History Narrative   Patient is divorced and lives alone.   Patient has one adult daughter.   Patient is retired.   Patient has a BSN degree in nursing.   Patient is right-handed.   Patient drinks one soda per week.    Family History  Problem Relation Age of Onset  . Diabetes Other   . Diabetes Brother   . Hypertension Brother   . Heart failure Daughter 40    ROS- All systems  are reviewed and negative except as per the HPI above  Physical Exam: Filed Vitals:   11/24/15 1139  BP: 148/80  Pulse: 70  Height: 5\' 5"  (1.651 m)  Weight: 127 lb 6.4 oz (57.788 kg)    GEN- The patient is well appearing, alert and oriented x 3 today.   Head- normocephalic, atraumatic Eyes-  Sclera clear, conjunctiva pink Ears- hearing intact Oropharynx- clear Neck- supple,  no JVP Lymph- no cervical lymphadenopathy Lungs- Clear to ausculation bilaterally, normal work of breathing Heart-  irregular rate and rhythm, no murmurs, rubs or gallops, PMI not laterally displaced GI- soft, NT, ND, + BS Extremities- no clubbing, cyanosis, or edema MS- no significant deformity or atrophy Skin- no rash or lesion Psych- euthymic mood, full affect Neuro- strength and sensation are intact  EKG- Afib with v rate 70 bpm, QRS int, 74 ms, QTc 388 ms. Epic records reviewed  Assessment and Plan: 1. Afib with slow ventricular response Improved on low dose bb, v rate in 70's now  2. Lower extremity edema Improved with stopping nifedipine, low dose lasix Elevate legs Avoid salt Compression socks  3. OSA Thought not to be a issue since pt has lost weight.  4. Falls/dizziness A cause for concern with blood thinners Previous dehydration Stay off blood thinners per conversation with Dr. Loletha Grayer. Reports no falls, but near misses. Uses cane/walker.  5. Chadsvasc score of at least 7 Concerning for CVA, due to fall risk and fear of bleeding, esp head injury, off blood thinners, continue ASA.  F/u with Dr. Loletha Grayer as scheduled  Afib clinic as needed  Geroge Baseman. Dequon Schnebly, [facility] [address] [place], [place] [address] [phone]

## 2015-12-17 ENCOUNTER — Emergency Department (HOSPITAL_COMMUNITY): Payer: Medicare Other

## 2015-12-17 ENCOUNTER — Encounter (HOSPITAL_COMMUNITY): Payer: Self-pay | Admitting: Emergency Medicine

## 2015-12-17 ENCOUNTER — Emergency Department (HOSPITAL_COMMUNITY)
Admission: EM | Admit: 2015-12-17 | Discharge: 2015-12-17 | Disposition: A | Discharge status: Home / Self Care | Payer: Medicare Other | Attending: Emergency Medicine | Admitting: Emergency Medicine

## 2015-12-17 DIAGNOSIS — Z7902 Long term (current) use of antithrombotics/antiplatelets: Secondary | ICD-10-CM | POA: Insufficient documentation

## 2015-12-17 DIAGNOSIS — S0990XA Unspecified injury of head, initial encounter: Secondary | ICD-10-CM | POA: Diagnosis not present

## 2015-12-17 DIAGNOSIS — M6281 Muscle weakness (generalized): Secondary | ICD-10-CM | POA: Diagnosis not present

## 2015-12-17 DIAGNOSIS — R531 Weakness: Secondary | ICD-10-CM | POA: Insufficient documentation

## 2015-12-17 DIAGNOSIS — Z9981 Dependence on supplemental oxygen: Secondary | ICD-10-CM | POA: Insufficient documentation

## 2015-12-17 DIAGNOSIS — Y998 Other external cause status: Secondary | ICD-10-CM | POA: Insufficient documentation

## 2015-12-17 DIAGNOSIS — Z859 Personal history of malignant neoplasm, unspecified: Secondary | ICD-10-CM | POA: Insufficient documentation

## 2015-12-17 DIAGNOSIS — R200 Anesthesia of skin: Secondary | ICD-10-CM | POA: Insufficient documentation

## 2015-12-17 DIAGNOSIS — G4733 Obstructive sleep apnea (adult) (pediatric): Secondary | ICD-10-CM | POA: Insufficient documentation

## 2015-12-17 DIAGNOSIS — W19XXXA Unspecified fall, initial encounter: Secondary | ICD-10-CM

## 2015-12-17 DIAGNOSIS — I10 Essential (primary) hypertension: Secondary | ICD-10-CM | POA: Insufficient documentation

## 2015-12-17 DIAGNOSIS — E119 Type 2 diabetes mellitus without complications: Secondary | ICD-10-CM | POA: Insufficient documentation

## 2015-12-17 DIAGNOSIS — Z8673 Personal history of transient ischemic attack (TIA), and cerebral infarction without residual deficits: Secondary | ICD-10-CM | POA: Insufficient documentation

## 2015-12-17 DIAGNOSIS — E785 Hyperlipidemia, unspecified: Secondary | ICD-10-CM | POA: Diagnosis not present

## 2015-12-17 DIAGNOSIS — W01198A Fall on same level from slipping, tripping and stumbling with subsequent striking against other object, initial encounter: Secondary | ICD-10-CM | POA: Insufficient documentation

## 2015-12-17 DIAGNOSIS — Y9389 Activity, other specified: Secondary | ICD-10-CM | POA: Diagnosis not present

## 2015-12-17 DIAGNOSIS — Z79899 Other long term (current) drug therapy: Secondary | ICD-10-CM | POA: Insufficient documentation

## 2015-12-17 DIAGNOSIS — Y92009 Unspecified place in unspecified non-institutional (private) residence as the place of occurrence of the external cause: Secondary | ICD-10-CM | POA: Insufficient documentation

## 2015-12-17 DIAGNOSIS — Z8742 Personal history of other diseases of the female genital tract: Secondary | ICD-10-CM | POA: Insufficient documentation

## 2015-12-17 DIAGNOSIS — H539 Unspecified visual disturbance: Secondary | ICD-10-CM | POA: Insufficient documentation

## 2015-12-17 DIAGNOSIS — R51 Headache: Secondary | ICD-10-CM | POA: Diagnosis not present

## 2015-12-17 LAB — CBC WITH DIFFERENTIAL/PLATELET
BASOS ABS: 0 10*3/uL (ref 0.0–0.1)
Basophils Relative: 0 %
EOS PCT: 3 %
Eosinophils Absolute: 0.2 10*3/uL (ref 0.0–0.7)
HCT: 33.4 % — ABNORMAL LOW (ref 36.0–46.0)
Hemoglobin: 10.9 g/dL — ABNORMAL LOW (ref 12.0–15.0)
Lymphocytes Relative: 23 %
Lymphs Abs: 1.2 10*3/uL (ref 0.7–4.0)
MCH: 29.2 pg (ref 26.0–34.0)
MCHC: 32.6 g/dL (ref 30.0–36.0)
MCV: 89.5 fL (ref 78.0–100.0)
Monocytes Absolute: 0.5 10*3/uL (ref 0.1–1.0)
Monocytes Relative: 9 %
Neutro Abs: 3.6 10*3/uL (ref 1.7–7.7)
Neutrophils Relative %: 65 %
PLATELETS: 170 10*3/uL (ref 150–400)
RBC: 3.73 MIL/uL — AB (ref 3.87–5.11)
RDW: 14.8 % (ref 11.5–15.5)
WBC: 5.5 10*3/uL (ref 4.0–10.5)

## 2015-12-17 LAB — URINALYSIS, ROUTINE W REFLEX MICROSCOPIC
Bilirubin Urine: NEGATIVE
Glucose, UA: NEGATIVE mg/dL
HGB URINE DIPSTICK: NEGATIVE
Ketones, ur: NEGATIVE mg/dL
Nitrite: NEGATIVE
PROTEIN: NEGATIVE mg/dL
Specific Gravity, Urine: 1.011 (ref 1.005–1.030)
pH: 6 (ref 5.0–8.0)

## 2015-12-17 LAB — BASIC METABOLIC PANEL
ANION GAP: 7 (ref 5–15)
BUN: 26 mg/dL — AB (ref 6–20)
CO2: 25 mmol/L (ref 22–32)
Calcium: 9.4 mg/dL (ref 8.9–10.3)
Chloride: 109 mmol/L (ref 101–111)
Creatinine, Ser: 1.37 mg/dL — ABNORMAL HIGH (ref 0.44–1.00)
GFR, EST AFRICAN AMERICAN: 40 mL/min — AB (ref >60)
GFR, EST NON AFRICAN AMERICAN: 34 mL/min — AB (ref >60)
Glucose, Bld: 109 mg/dL — ABNORMAL HIGH (ref 65–99)
POTASSIUM: 4.2 mmol/L (ref 3.5–5.1)
SODIUM: 141 mmol/L (ref 135–145)

## 2015-12-17 LAB — URINE MICROSCOPIC-ADD ON
Bacteria, UA: NONE SEEN
RBC / HPF: NONE SEEN RBC/hpf (ref 0–5)

## 2015-12-17 LAB — I-STAT TROPONIN, ED: TROPONIN I, POC: 0.01 ng/mL (ref 0.00–0.08)

## 2015-12-17 NOTE — ED Notes (Signed)
Pt states she fell off side of bed 12/19 struck L side of head on closet door, denies LOC, pt was not seen at that time. Pt does take Eliquis BID, pt states since fall she has not felt well, HA, increased weakness (generalized), slight confusion at times, unsteady gait.  Spoke with Dr. Lita Mains, head CT ordered.

## 2015-12-17 NOTE — ED Provider Notes (Signed)
CSN: [id number]     Arrival date & time 12/17/15  1951 History   First MD Initiated Contact with Patient 12/17/15 2015     Chief Complaint  Patient presents with  . Fall     (Consider location/radiation/quality/duration/timing/severity/associated sxs/prior Treatment) Patient is a 81 y.o. female presenting with fall.  Fall Associated symptoms include headaches, numbness (chronic, unchanged) and weakness.   Pt is a 81yo female with PMH of HTN, Afib (on Eliquis) and DM who presents to the ED with complaint of recent fall. Pt reports she fell on 12/19. She notes she was sitting on the edge of her bed cleaning when she slipped off the sheets resulting in her head hitting the closet door. Denies LOC. Pt reports she did not go see her PCP or come to the ED initially s/p fall. She also endorses having generalized weakness and head for the past few months. She reports having intermittent aching pain to her frontal region, denies any aggravating/alleviating factors. She notes she was seen by her PCP for her headaches a few weeks ago and states she was advised to take Tylenol and notes she has been having relief. She also notes she feels that her vision has slightly worsened over the past few months and notes she has an appointment scheduled with her ophthalmologist. Denies fever, chills, cough, SOB, CP, palpitations, abdominal pain, N/V/D, urinary sxs, syncope or seizure. She notes she has numbness/pain to bilateral feet but notes she has a hx of neuropathy and denies any acute changes.    Past Medical History  Diagnosis Date  . Diabetes mellitus   . Hypertension   . Cancer ([place])   . Hyperlipemia   . Pulmonary hypertension ([place])     moderate echo 11/07/10  . Carotid arterial disease ([place]) 12/13/11     mild by doppler  . PVD (peripheral vascular disease) ([place])   . OSA (obstructive sleep apnea)   . Anemia   . Atrophic vaginitis   . Sleep apnea   . Osteoporosis   . CVA (cerebral vascular  accident) ([place])   . Blood transfusion without reported diagnosis   . OSA on CPAP 06/20/2014   Past Surgical History  Procedure Laterality Date  . Breast surgery      right 07/26/2000  . Breast lumpectomy Right     chemo/radiation  . Breast lumpectomy Left     benign  . Colonoscopy  2010  . Ingrown toenails      removed in 03/2013  . Cardioversion N/A 07/21/2014    Procedure: CARDIOVERSION;  Surgeon: Sanda Klein, MD;  Location: [facility];  Service: Cardiovascular;  Laterality: N/A;   Family History  Problem Relation Age of Onset  . Diabetes Other   . Diabetes Brother   . Hypertension Brother   . Heart failure Daughter 27   Social History  Substance Use Topics  . Smoking status: Never Smoker   . Smokeless tobacco: Never Used  . Alcohol Use: No   OB History    Gravida Para Term Preterm AB TAB SAB Ectopic Multiple Living   4 2   2  2   1      Review of Systems  Eyes: Positive for visual disturbance.  Neurological: Positive for weakness, numbness (chronic, unchanged) and headaches.  All other systems reviewed and are negative.     Allergies  Review of patient's allergies indicates no known allergies.  Home Medications   Prior to Admission medications   Medication Sig Start Date End Date  Taking? Authorizing Provider  acetaminophen (TYLENOL) 500 MG tablet Take 500 mg by mouth every 6 (six) hours as needed for moderate pain.    Historical Provider, MD  apixaban (ELIQUIS) 2.5 MG TABS tablet Take 1 tablet (2.5 mg total) by mouth 2 (two) times daily. 10/16/15   Mihai Croitoru, MD  calcium-vitamin D (OSCAL WITH D) 500-200 MG-UNIT per tablet Take 1 tablet by mouth daily with breakfast.     Historical Provider, MD  carvedilol (COREG) 3.125 MG tablet Take 1 tablet (3.125 mg total) by mouth 2 (two) times daily with a meal. 10/16/15   Mihai Croitoru, MD  Cholecalciferol (VITAMIN D-3) 1000 UNITS CAPS Take 1 capsule by mouth daily.    Historical Provider, MD  Coenzyme Q10 (CO Q  10) 100 MG CAPS Take 1 capsule by mouth daily.    Historical Provider, MD  diazepam (VALIUM) 5 MG tablet Take 1 tablet (5 mg total) by mouth every 8 (eight) hours as needed (dizziness). 04/18/15   Merryl Hacker, MD  furosemide (LASIX) 20 MG tablet Take 1 tablet (20 mg total) by mouth 4 (four) times a week. TAKE ONE TABLET BY MOUTH MONDAYS/WEDNESDAY/FRIDAYS AND SATURDAYS 10/16/15   Mihai Croitoru, MD  hydrOXYzine (ATARAX/VISTARIL) 25 MG tablet Take 1 tablet (25 mg total) by mouth 3 (three) times daily as needed for itching. 07/20/15   Larey Seat, MD  lisinopril (PRINIVIL,ZESTRIL) 20 MG tablet TAKE 2 TABLETS BY MOUTH DAILY 11/10/15   Minus Breeding, MD  metFORMIN (GLUCOPHAGE-XR) 500 MG 24 hr tablet Take 500 mg by mouth 2 (two) times daily.    Historical Provider, MD  Polyethyl Glycol-Propyl Glycol (SYSTANE OP) Apply 1 drop to eye 2 (two) times daily. SYSTANE    Historical Provider, MD  pravastatin (PRAVACHOL) 40 MG tablet Take 40 mg by mouth daily.    Historical Provider, MD  promethazine (PHENERGAN) 25 MG tablet Take 25 mg by mouth daily. nausea    Historical Provider, MD  triamcinolone cream (KENALOG) 0.1 % Apply 1 application topically 2 (two) times daily. Patient taking differently: Apply 1 application topically 2 (two) times daily as needed (rash/sore). On left foot. 01/12/15   Trula Slade, DPM  vitamin B-12 (CYANOCOBALAMIN) 1000 MCG tablet Take 1,000 mcg by mouth 2 (two) times daily.     Historical Provider, MD   BP 165/76 mmHg  Pulse 72  Temp(Src) 98.5 F (36.9 C) (Oral)  Resp 17  Wt 57.607 kg  SpO2 100% Physical Exam  Constitutional: She is oriented to person, place, and time. She appears well-developed and well-nourished. No distress.  HENT:  Head: Normocephalic and atraumatic. Head is without raccoon's eyes, without Battle's sign, without abrasion, without contusion and without laceration.  Right Ear: Tympanic membrane normal. No hemotympanum.  Left Ear: Tympanic membrane  normal. No hemotympanum.  Nose: Nose normal. Right sinus exhibits no maxillary sinus tenderness and no frontal sinus tenderness. Left sinus exhibits no maxillary sinus tenderness and no frontal sinus tenderness.  Mouth/Throat: Uvula is midline, oropharynx is clear and moist and mucous membranes are normal. No oropharyngeal exudate.  Eyes: Conjunctivae and EOM are normal. Right eye exhibits no discharge. Left eye exhibits no discharge. No scleral icterus.  Left pupil irregularly shaped (s/p cataract surgery), right pupil round. Bilateral pupils round and reactive to light.   Neck: Normal range of motion. Neck supple.  Cardiovascular: Normal rate, normal heart sounds and intact distal pulses.   Irregularly irregular rhythm  Pulmonary/Chest: Effort normal and breath sounds normal. No respiratory distress.  She has no wheezes. She has no rales. She exhibits no tenderness.  Abdominal: Soft. Bowel sounds are normal. She exhibits no distension and no mass. There is no tenderness. There is no rebound and no guarding.  Musculoskeletal: She exhibits no edema.  No C/T/L midline tenderness.  5/5 strength BUE and BLE. FROM of BUE and BLE. Sensation intact. 2+ radial and DP pulses. Cap refill <2.  No pelvic instability.  Lymphadenopathy:    She has no cervical adenopathy.  Neurological: She is alert and oriented to person, place, and time. She has normal strength. No cranial nerve deficit or sensory deficit. Coordination normal.  Skin: Skin is warm and dry.  No contusions, abrasions or lacerations noted.   Nursing note and vitals reviewed.   ED Course  Procedures (including critical care time) Labs Review Labs Reviewed  CBC WITH DIFFERENTIAL/PLATELET - Abnormal; Notable for the following:    RBC 3.73 (*)    Hemoglobin 10.9 (*)    HCT 33.4 (*)    All other components within normal limits  BASIC METABOLIC PANEL - Abnormal; Notable for the following:    Glucose, Bld 109 (*)    BUN 26 (*)     Creatinine, Ser 1.37 (*)    GFR calc non Af Amer 34 (*)    GFR calc Af Amer 40 (*)    All other components within normal limits  URINALYSIS, ROUTINE W REFLEX MICROSCOPIC (NOT AT [facility]) - Abnormal; Notable for the following:    Leukocytes, UA TRACE (*)    All other components within normal limits  URINE MICROSCOPIC-ADD ON - Abnormal; Notable for the following:    Squamous Epithelial / LPF 0-5 (*)    All other components within normal limits  I-STAT TROPOININ, ED    Imaging Review Ct Head Wo Contrast  12/17/2015  CLINICAL DATA:  81 year old female with fall and trauma to the left side of the head. EXAM: CT HEAD WITHOUT CONTRAST TECHNIQUE: Contiguous axial images were obtained from the base of the skull through the vertex without intravenous contrast. COMPARISON:  Head CT dated 06/23/2015 FINDINGS: The ventricles and sulci are appropriate in size for patient's age. Periventricular and deep white matter hypodensities represent chronic microvascular ischemic changes. There is no intracranial hemorrhage. No mass effect or midline shift identified. There is mild periosteal thickening of the left maxillary sinus. The remainder of the The visualized paranasal sinuses and mastoid air cells are well aerated. The calvarium is intact. IMPRESSION: No acute intracranial hemorrhage. Age-related atrophy and chronic microvascular ischemic disease. Electronically Signed   By: Anner Crete M.D.   On: 12/17/2015 21:18   I have personally reviewed and evaluated these images and lab results as part of my medical decision-making.   EKG Interpretation None      MDM   Final diagnoses:  Fall, initial encounter    Pt presents with recent unwitnessed mechanical fall on 12/19 with reported head injury, no LOC. She also endorses having weakness over the past month. Hx of afib (on Eliquis). VSS. Exam revealed irregular left pupil (s/p cataract surgery), neuro exam otherwise unremarkable. EKG showed afib, unchanged  from prior. Labs and UA unremarkable, unchanged from baseline. Ct head showed no acute abnormality. Negative orthostatics. Pt able to ambulate with cane (which she uses at home). I have a low suspicion for ACS, PE, dissection, or other acute cardiac event at this time and I do not suspect CVA or intracranial lesion. Discussed results and plan to d/c pt home. Advised  pt to follow up with PCP.  Evaluation does not show pathology requring ongoing emergent intervention or admission. Pt is hemodynamically stable and mentating appropriately. All questions answered. Return precautions discussed and outpatient follow up given.          Chesley Noon [place], [place] 12/18/15 0030  Julianne Rice, MD 12/27/15 [phone]

## 2015-12-17 NOTE — Discharge Instructions (Signed)
Continue taking your medications as prescribed. Follow up with your primary care provider in 2-3 days. Please return to the Emergency Department if symptoms worsen or new onset of fever, headache, change in vision, lightheadedness, dizziness, weakness, chest pain, difficulty breathing, abdominal pain, vomiting.

## 2015-12-17 NOTE — ED Notes (Signed)
Ambulated pt in hallway without incident. Pt ok for discharge.

## 2016-01-10 ENCOUNTER — Encounter (INDEPENDENT_AMBULATORY_CARE_PROVIDER_SITE_OTHER): Payer: Self-pay | Admitting: Ophthalmology

## 2016-01-21 DIAGNOSIS — I5032 Chronic diastolic (congestive) heart failure: Secondary | ICD-10-CM | POA: Insufficient documentation

## 2016-01-21 DIAGNOSIS — I4891 Unspecified atrial fibrillation: Secondary | ICD-10-CM | POA: Insufficient documentation

## 2016-01-21 NOTE — Progress Notes (Signed)
Patient ID: Donna Owens, female   DOB: 10-10-1928, 82 y.o.   MRN: [id number]    Cardiology Office Note    Date:  01/22/2016   ID:  Donna Owens, DOB 07-25-1928, MRN [id number]  PCP:  [name], MD  Cardiologist:   Sanda Klein, MD   Chief Complaint  Patient presents with  . 3 month visit    pt c/o chest dicomfort, feeling forceful beats occasionally, constant mild headache, and occasional swelling in bilateral feet/legs (neuropathy in feet)    History of Present Illness:  Donna Owens is a 82 y.o. female with long term persistent atrial fibrillation with slow ventricular response, diastolic heart failure, HTN, DM, hyperlipidemia, severe neuropathy with unsteady gait and frequent falls.   Her anticoagulant was held for a while after one of these falls, but she is now back on Eliquis. None of her falls have been associated with serious injury or intracranial hemorrhage. Her weight loss has stabilized over the last few months. Her BMI is just over 21.  Her complaints are primarily related to numbness in her feet and severe neuralgic pain/tingling at night. This seems to improve when she sits up in a recliner compared to lying in bed. She also describes fairly profound dizziness when she first gets out of bed in the morning, in a pattern suggestive of orthostatic hypotension.  She has a remote history of ischemic stroke but anticoagulation was held after she had several falls. The dose of beta blocker has been progressively reduced due to slow ventricular response.  Past Medical History  Diagnosis Date  . Diabetes mellitus   . Hypertension   . Cancer ([place])   . Hyperlipemia   . Pulmonary hypertension ([place])     moderate echo 11/07/10  . Carotid arterial disease ([place]) 12/13/11     mild by doppler  . PVD (peripheral vascular disease) ([place])   . OSA (obstructive sleep apnea)   . Anemia   . Atrophic vaginitis   . Sleep apnea   . Osteoporosis   . CVA (cerebral vascular  accident) ([place])   . Blood transfusion without reported diagnosis   . OSA on CPAP 06/20/2014    Past Surgical History  Procedure Laterality Date  . Breast surgery      right 07/26/2000  . Breast lumpectomy Right     chemo/radiation  . Breast lumpectomy Left     benign  . Colonoscopy  2010  . Ingrown toenails      removed in 03/2013  . Cardioversion N/A 07/21/2014    Procedure: CARDIOVERSION;  Surgeon: Sanda Klein, MD;  Location: [facility];  Service: Cardiovascular;  Laterality: N/A;    Outpatient Prescriptions Prior to Visit  Medication Sig Dispense Refill  . acetaminophen (TYLENOL) 500 MG tablet Take 500 mg by mouth every 6 (six) hours as needed for moderate pain.    Marland Kitchen apixaban (ELIQUIS) 2.5 MG TABS tablet Take 1 tablet (2.5 mg total) by mouth 2 (two) times daily. 180 tablet 1  . calcium-vitamin D (OSCAL WITH D) 500-200 MG-UNIT per tablet Take 1 tablet by mouth daily with breakfast.     . carvedilol (COREG) 3.125 MG tablet Take 1 tablet (3.125 mg total) by mouth 2 (two) times daily with a meal. 180 tablet 1  . Cholecalciferol (VITAMIN D-3) 1000 UNITS CAPS Take 1 capsule by mouth daily.    . Coenzyme Q10 (CO Q 10) 100 MG CAPS Take 1 capsule by mouth daily.    . diazepam (VALIUM) 5  MG tablet Take 1 tablet (5 mg total) by mouth every 8 (eight) hours as needed (dizziness). 5 tablet 0  . furosemide (LASIX) 20 MG tablet Take 1 tablet (20 mg total) by mouth 4 (four) times a week. TAKE ONE TABLET BY MOUTH MONDAYS/WEDNESDAY/FRIDAYS AND SATURDAYS 90 tablet 3  . hydrOXYzine (ATARAX/VISTARIL) 25 MG tablet Take 1 tablet (25 mg total) by mouth 3 (three) times daily as needed for itching. 30 tablet 2  . lisinopril (PRINIVIL,ZESTRIL) 20 MG tablet TAKE 2 TABLETS BY MOUTH DAILY 180 tablet 3  . metFORMIN (GLUCOPHAGE-XR) 500 MG 24 hr tablet Take 500 mg by mouth daily.     Donna Owens Glycol-Propyl Glycol (SYSTANE OP) Apply 1 drop to eye 2 (two) times daily. SYSTANE    . pravastatin (PRAVACHOL) 40 MG  tablet Take 40 mg by mouth daily.    . promethazine (PHENERGAN) 25 MG tablet Take 25 mg by mouth daily. nausea    . triamcinolone cream (KENALOG) 0.1 % Apply 1 application topically 2 (two) times daily. (Patient taking differently: Apply 1 application topically 2 (two) times daily as needed (rash/sore). On left foot.) 30 g 0  . vitamin B-12 (CYANOCOBALAMIN) 1000 MCG tablet Take 1,000 mcg by mouth 2 (two) times daily.      No facility-administered medications prior to visit.     Allergies:   Review of patient's allergies indicates no known allergies.   Social History   Social History  . Marital Status: Divorced    Spouse Name: N/A  . Number of Children: 1  . Years of Education: BSN   Occupational History  . RETIRED    Social History Main Topics  . Smoking status: Never Smoker   . Smokeless tobacco: Never Used  . Alcohol Use: No  . Drug Use: No  . Sexual Activity: No   Other Topics Concern  . None   Social History Narrative   Patient is divorced and lives alone.   Patient has one adult daughter.   Patient is retired.   Patient has a BSN degree in nursing.   Patient is right-handed.   Patient drinks one soda per week.     Family History:  The patient's family history includes Diabetes in her brother and other; Heart failure (age of onset: 60) in her daughter; Hypertension in her brother.   ROS:   Please see the history of present illness.    Review of Systems  Constitution: Negative for chills, fever, malaise/fatigue and weight loss.  HENT: Positive for congestion. Negative for headaches and nosebleeds.   Eyes: Negative for blurred vision, vision loss in left eye and vision loss in right eye.  Cardiovascular: Negative for chest pain, claudication, leg swelling, orthopnea, paroxysmal nocturnal dyspnea and syncope.  Respiratory: Negative for cough, shortness of breath and wheezing.   Endocrine: Negative for cold intolerance, polydipsia and polyuria.    Hematologic/Lymphatic: Does not bruise/bleed easily.  Skin: Negative for poor wound healing and rash.  Musculoskeletal: Positive for arthritis and joint pain.  Gastrointestinal: Negative for abdominal pain, change in bowel habit, heartburn, hematemesis and melena.  Genitourinary: Negative for bladder incontinence and non-menstrual bleeding.  Neurological: Positive for light-headedness, loss of balance, numbness and paresthesias. Negative for focal weakness and vertigo.  Psychiatric/Behavioral: Negative for altered mental status, depression and memory loss.   All other systems reviewed and are negative.   PHYSICAL EXAM:   VS:  BP 156/68 mmHg  Pulse 60  Ht 5\' 5"  (1.651 m)  Wt 58.196 kg (128  lb 4.8 oz)  BMI 21.35 kg/m2   GEN: Well nourished, well developed, in no acute distress HEENT: normal Neck: no JVD, carotid bruits, or masses Cardiac: irregular, no murmurs, rubs, or gallops,no edema , diminished pedal pulses bilaterally Respiratory:  clear to auscultation bilaterally, normal work of breathing GI: soft, nontender, nondistended, + BS MS: no deformity or atrophy Skin: warm and dry, no rash Neuro:  Alert and Oriented x 3, Strength and sensation are intact Psych: euthymic mood, full affect  Wt Readings from Last 3 Encounters:  01/22/16 58.196 kg (128 lb 4.8 oz)  12/17/15 57.607 kg (127 lb)  11/24/15 57.788 kg (127 lb 6.4 oz)      Studies/Labs Reviewed:   EKG:  EKG is not ordered today.   Recent Labs: 07/21/2015: ALT 17; TSH 0.863 12/17/2015: BUN 26*; Creatinine, Ser 1.37*; Hemoglobin 10.9*; Platelets 170; Potassium 4.2; Sodium 141   Lipid Panel    Component Value Date/Time   CHOL 139 02/02/2014 1106   TRIG 47 02/02/2014 1106   HDL 54 02/02/2014 1106   CHOLHDL 2.6 02/02/2014 1106   VLDL 9 02/02/2014 1106   LDLCALC 76 02/02/2014 1106     ASSESSMENT:    1. Atrial fibrillation with slow ventricular response (Donna Owens)   2. Chronic diastolic heart failure (Donna Owens)   3.  Essential hypertension   4. Dyslipidemia   5. PAD (peripheral artery disease) ([place])   6. OSA on CPAP   7. Type 2 diabetes mellitus with diabetic peripheral angiopathy without gangrene, without long-term current use of insulin (Donna Owens)      PLAN:  In order of problems listed above:  1. Afib: CHADSVasc score 9 is very high , but she has had multiple falls asn continues to have unsteady gait. On anticoagulation. Avoid ASA  And NSAIDs. If she has continued problems with falls, may have to decide to stop anticoagulation permanently she has excellent rate control on a very low dose of beta blocker 2. CHF: Clinically euvolemic, NYHA functional class I-II. Limited more by her balance problems then any symptoms of heart failure 3. HTN: At times at home her systolic blood pressure is around 160. Today it is acceptable. Avoid "perfect" blood pressure control since she appears to have symptoms and signs of orthostatic hypotension and falls 4. Donna Owens: Satisfactory lipid profile 5. PAD: She has mostly infrapopliteal disease (lateral two-vessel runoff, ABI 1.1 on right 0.91 on left, 2014). I don't think her neuropathy is ischemic, but worthwhile repeating ankle brachial indices and a full evaluation if there is evidence of progression of arterial insufficiency. He did not have renal artery stenosis by ultrasound in 2015. She has an appointment with Dr. Brett Owens tomorrow. 6. Donna Owens to have resolved after marked weight loss 7. DM: Reports excellent control. Now on a single metformin tablet daily    Medication Adjustments/Labs and Tests Ordered: Current medicines are reviewed at length with the patient today.  Concerns regarding medicines are outlined above.  Medication changes, Labs and Tests ordered today are listed in the Patient Instructions below. Patient Instructions  Your physician has requested that you have a lower extremity arterial exercise duplex. During this test, exercise and ultrasound are used to  evaluate arterial blood flow in the legs. Allow one hour for this exam. There are no restrictions or special instructions.  Donna Owens recommends that you schedule a follow-up appointment in: 6 months       Signed, Donna Bodiford, MD  01/22/2016 11:26 AM    [facility]  [address], [place], [place]  [address] Phone: [phone]; Fax: [phone]    

## 2016-01-22 ENCOUNTER — Encounter: Payer: Self-pay | Admitting: Cardiovascular Disease

## 2016-01-22 ENCOUNTER — Ambulatory Visit (INDEPENDENT_AMBULATORY_CARE_PROVIDER_SITE_OTHER): Payer: Medicare Other | Admitting: Cardiovascular Disease

## 2016-01-22 VITALS — BP 140/84 | HR 60 | Resp 16 | Ht 65.0 in | Wt 128.3 lb

## 2016-01-22 DIAGNOSIS — G4733 Obstructive sleep apnea (adult) (pediatric): Secondary | ICD-10-CM

## 2016-01-22 DIAGNOSIS — E1151 Type 2 diabetes mellitus with diabetic peripheral angiopathy without gangrene: Secondary | ICD-10-CM

## 2016-01-22 DIAGNOSIS — Z9989 Dependence on other enabling machines and devices: Secondary | ICD-10-CM

## 2016-01-22 DIAGNOSIS — I10 Essential (primary) hypertension: Secondary | ICD-10-CM | POA: Diagnosis not present

## 2016-01-22 DIAGNOSIS — E785 Hyperlipidemia, unspecified: Secondary | ICD-10-CM

## 2016-01-22 DIAGNOSIS — I5032 Chronic diastolic (congestive) heart failure: Secondary | ICD-10-CM | POA: Diagnosis not present

## 2016-01-22 DIAGNOSIS — I4891 Unspecified atrial fibrillation: Secondary | ICD-10-CM

## 2016-01-22 DIAGNOSIS — I739 Peripheral vascular disease, unspecified: Secondary | ICD-10-CM

## 2016-01-22 NOTE — Progress Notes (Signed)
I agree with the assessment and plan as directed by MD .The patient is known to me.    Berkeley Veldman, MD  

## 2016-01-22 NOTE — Patient Instructions (Signed)
Your physician has requested that you have a lower extremity arterial exercise duplex. During this test, exercise and ultrasound are used to evaluate arterial blood flow in the legs. Allow one hour for this exam. There are no restrictions or special instructions.  Dr. Sallyanne Kuster recommends that you schedule a follow-up appointment in: 6 months

## 2016-01-23 ENCOUNTER — Ambulatory Visit (INDEPENDENT_AMBULATORY_CARE_PROVIDER_SITE_OTHER): Payer: Medicare Other | Admitting: Neurology

## 2016-01-23 ENCOUNTER — Encounter: Payer: Self-pay | Admitting: Neurology

## 2016-01-23 VITALS — BP 152/78 | HR 68 | Resp 20 | Ht 65.0 in | Wt 127.0 lb

## 2016-01-23 DIAGNOSIS — R296 Repeated falls: Secondary | ICD-10-CM

## 2016-01-23 DIAGNOSIS — G3184 Mild cognitive impairment, so stated: Secondary | ICD-10-CM | POA: Diagnosis not present

## 2016-01-23 MED ORDER — GABAPENTIN 300 MG PO CAPS: 300.0000 mg | ORAL_CAPSULE | Freq: Every day | ORAL | Status: DC

## 2016-01-23 NOTE — Progress Notes (Signed)
[facility]    Provider:  Dr Jaynee Eagles Referring Provider: Leanna Battles, MD Primary Care Physician:  [name], MD  CC:  Dizziness  HPI:  Donna Owens is a 82 y.o. female here as a referral from Dr. Philip Aspen for a fall. She has an urgent add-on today for the work in doctor because Dr. Roddie Mc is out of the office. Daughter wanted patient seen. She is accompanied by daughter today. She is lightheaded. Blood pressure this morning in the office is very low 96/50. Symptoms Started 2 months ago. She denies room spinning. She gets lightheasded like she is going to fall. She feels light headed in the morning when she gets up. She takes her blood pressure in the morning and iit is a little lower about [id number] or [id number] systolic. The dizziness is when she stands up and gets out of bed. No dizziness any other time during the day. She was getting up to go to the bathroom and she stood up and fell and her legs got caught up on herself and she slipped and fell. She hit her head.  she hit it on the right side of the head and tripped. She is more disoriented, going on for several weeks.     Reviewed notes, labs and imaging from outside physicians, which showed: Patient was seen in the ED in April after the fall. CT showed nothing acute (personally reviewed images). Labs at that time showed dehydration. Donna Owens is a 82 y.o. female here as a referral from Dr. Philip Aspen for a fall. She has an urgent add-on today for the work in doctor because Dr. Roddie Mc is out of the office. Daughter wanted patient seen. She is accompanied by daughter today. Patient reports 2 months of dizziness, lightheadedness, falls and recent confusion. Blood pressure this morning in the office is very low 96/50. Icalled Dr. Benn Moulder  and ask him to see the patient as soon as possible think she has returned to paroxysmal atrial fibrillation this is been explanation for both could lead to the headaches in the morning to  shortness of breath to feeling dizzy and lightheaded and to her ankle edema. She has significant problems with heat and humidity tolerance.   Home physical therapy for her gait and falls, she lives alone We'll get a stat CBC, CMP and urinalysis with reflex microscopy Discussed that her blood pressures. Lower on the office today and she might be dehydrated again, this is likely the cause of her symptoms. Symptoms to sound orthostatic in nature. She needs to increase her fluid intake.She should follow-up Dr. Sharlett Iles. Daughter's cell phone number is [phone].   Interval history C Ipek Westra. From  07-20-15   The patient reports ongoing dizziness she was dehydrated in June been my colleague and partner Dr. Sarina Ill evaluated her.  Suffered a fall and Dr. Philip Aspen referred her here. She became an urgent "" work in Dr. patient that pressure in the office was 90/50.  The symptoms had begun 2 months earlier already in late April she denied any vertigo sensation of spinning but she gets but she confesses that she didn't like drinking water very much and he had took her blood pressure pills in the morning it seems that the medication in combination with the dehydration to her falls and dizziness. The dizziness now has continued. All she hit the right side of the head and tripped , remained for 2 weeks  disoriented after this this fall- meanwhile , this has improved. She  increased her fluid intake. She developed severe lower extremity edema. Remains on all BP medications.  Labs reviewed after last visit. Her renal clearance is reduced by 50%, her creatinine was 1.5 for BUN 33 I believe that she was mainly dehydrated but she does not have a lot of muscle mass and so the creatinine is definitely elevated. She is also anemic but claims that all her life this has been the case. The patient is of African-American and Native American descent.  (Che[place] - maternal grandmother).   She is also a patient of Dr.  Benn Moulder at the [facility]. She most likely has a cardiac reason for her fluid retention in both lower extremities. I assured her that her liver function was fine this was one of her concerns. Her renal function is decreased. I like for her to take a multivitamin with a little bit of iron and assured her that she does not need a CPAP since she does not have enough apnea to justify the step. I think the recent confusion has cleared and she probably suffered a post concussion syndrome. Today's blood pressure has recovered to normal. She did have a urinary tract infection 4 weeks ago which has meanwhile been treated this also will have helped her to regain a normal cognitive status, encouraged her to speak to Dr Benn Moulder and Dr Philip Aspen.   Interval history from 01/23/2016, Donna Owens is here today for a revisit. She reports that she has started to eat more take out food after she had a couple of meals burn on the stove. She states that usually the telephone rings and it distracts her but she is cooking leaving her with a mass on the stove. We discussed today that the very high salt content of many processed foods may not be to her benefit and she may consider using a meal service. I think that she would enjoy cooking if she wouldn't have to shop for single portion preparation. Her closest family is her daughter and her daughter's children. She reports that there has been no complained about cognitive decline on memory failures. She also feels not excessively daytime sleepy. We performed today and mini mental status examination at the patient scored 24 points on the animal fluency test today she scored only 7 points which is lower than last time and she had significant difficulties with copying an image or drawing a clock face. 24-30 is indicative of mild dementia.   MMSE - Mini Mental State Exam 01/23/2016  Orientation to time 4  Orientation to Place 4  Registration 3  Attention/  Calculation 5  Recall 1  Language- name 2 objects 2  Language- repeat 0  Language- follow 3 step command 3  Language- read & follow direction 1  Write a sentence 1  Copy design 0  Total score 24      Review of Systems: Patient complains of symptoms per HPI as well as the following symptoms:  Weight loss,  Depression, loneliness, fatigue, blurred vision, shortness of breath, snoring, increased thirst, incontinence, joint swelling, aching muscles, urination problems, incontinence, confusion, slurred speech, tremor, depression, decreased energy, snoring, restless legs.  Epworth 7,  FSS not answered.  Pertinent negatives per HPI. All others negative.   Social History   Social History  . Marital Status: Divorced    Spouse Name: N/A  . Number of Children: 1  . Years of Education: BSN   Occupational History  . RETIRED    Social History Main  Topics  . Smoking status: Never Smoker   . Smokeless tobacco: Never Used  . Alcohol Use: No  . Drug Use: No  . Sexual Activity: No   Other Topics Concern  . Not on file   Social History Narrative   Patient is divorced and lives alone.   Patient has one adult daughter.   Patient is retired.   Patient has a BSN degree in nursing.   Patient is right-handed.   Patient drinks one soda per week.    Family History  Problem Relation Age of Onset  . Diabetes Other   . Diabetes Brother   . Hypertension Brother   . Heart failure Daughter 39    Past Medical History  Diagnosis Date  . Diabetes mellitus   . Hypertension   . Cancer ([place])   . Hyperlipemia   . Pulmonary hypertension ([place])     moderate echo 11/07/10  . Carotid arterial disease ([place]) 12/13/11     mild by doppler  . PVD (peripheral vascular disease) ([place])   . OSA (obstructive sleep apnea)   . Anemia   . Atrophic vaginitis   . Sleep apnea   . Osteoporosis   . CVA (cerebral vascular accident) ([place])   . Blood transfusion without reported diagnosis   . OSA on CPAP  06/20/2014    Past Surgical History  Procedure Laterality Date  . Breast surgery      right 07/26/2000  . Breast lumpectomy Right     chemo/radiation  . Breast lumpectomy Left     benign  . Colonoscopy  2010  . Ingrown toenails      removed in 03/2013  . Cardioversion N/A 07/21/2014    Procedure: CARDIOVERSION;  Surgeon: Sanda Klein, MD;  Location: [facility];  Service: Cardiovascular;  Laterality: N/A;    Current Outpatient Prescriptions  Medication Sig Dispense Refill  . acetaminophen (TYLENOL) 500 MG tablet Take 500 mg by mouth every 6 (six) hours as needed for moderate pain.    Marland Kitchen apixaban (ELIQUIS) 2.5 MG TABS tablet Take 1 tablet (2.5 mg total) by mouth 2 (two) times daily. 180 tablet 1  . calcium-vitamin D (OSCAL WITH D) 500-200 MG-UNIT per tablet Take 1 tablet by mouth daily with breakfast.     . carvedilol (COREG) 3.125 MG tablet Take 1 tablet (3.125 mg total) by mouth 2 (two) times daily with a meal. 180 tablet 1  . Cholecalciferol (VITAMIN D-3) 1000 UNITS CAPS Take 1 capsule by mouth daily.    . Coenzyme Q10 (CO Q 10) 100 MG CAPS Take 1 capsule by mouth daily.    . diazepam (VALIUM) 5 MG tablet Take 1 tablet (5 mg total) by mouth every 8 (eight) hours as needed (dizziness). 5 tablet 0  . furosemide (LASIX) 20 MG tablet Take 1 tablet (20 mg total) by mouth 4 (four) times a week. TAKE ONE TABLET BY MOUTH MONDAYS/WEDNESDAY/FRIDAYS AND SATURDAYS 90 tablet 3  . hydrOXYzine (ATARAX/VISTARIL) 25 MG tablet Take 1 tablet (25 mg total) by mouth 3 (three) times daily as needed for itching. 30 tablet 2  . lisinopril (PRINIVIL,ZESTRIL) 20 MG tablet TAKE 2 TABLETS BY MOUTH DAILY 180 tablet 3  . metFORMIN (GLUCOPHAGE-XR) 500 MG 24 hr tablet Take 500 mg by mouth daily.     Vladimir Faster Glycol-Propyl Glycol (SYSTANE OP) Apply 1 drop to eye 2 (two) times daily. SYSTANE    . pravastatin (PRAVACHOL) 40 MG tablet Take 40 mg by mouth daily.    Marland Kitchen  promethazine (PHENERGAN) 25 MG tablet Take 25 mg by  mouth daily. nausea    . triamcinolone cream (KENALOG) 0.1 % Apply 1 application topically 2 (two) times daily. (Patient taking differently: Apply 1 application topically 2 (two) times daily as needed (rash/sore). On left foot.) 30 g 0  . vitamin B-12 (CYANOCOBALAMIN) 1000 MCG tablet Take 1,000 mcg by mouth 2 (two) times daily.      No current facility-administered medications for this visit.    Allergies as of 01/23/2016  . (No Known Allergies)    Vitals: BP 152/78 mmHg  Pulse 68  Resp 20  Ht 5\' 5"  (1.651 m)  Wt 127 lb (57.607 kg)  BMI 21.13 kg/m2 Last Weight:  Wt Readings from Last 1 Encounters:  01/23/16 127 lb (57.607 kg)   Last Height:   Ht Readings from Last 1 Encounters:  01/23/16 5\' 5"  (1.651 m)   Physical exam:  General: The patient is awake, alert and appears not in acute distress. The patient is well groomed. Head: Normocephalic, atraumatic.  Neck is supple. Mallampati 4, neck circumference:13.5 inches  Cardiovascular:  Regular rate and rhythm, slow, without  murmurs or carotid bruit, and without distended neck veins. Respiratory: Lungs are clear to auscultation. Skin:  With evidence of lower extremity edema, not  rash Trunk:  patient has normal posture.  Neurologic exam : The patient is awake and alert, oriented to place and time.  Memory subjective  described as intact. Recovered . There is a normal attention span & concentration ability.  Speech is fluent without dysarthria, dysphonia or aphasia.  Mood and affect are appropriate.  Cranial nerves: Pupils are equal and briskly reactive to light. Funduscopic exam without  evidence of pallor or edema.  She is status post cataract surgery . Extraocular movements  in vertical and horizontal planes intact and without nystagmus. Visual fields by finger perimetry are intact. Hearing to finger rub intact. Facial sensation intact to fine touch.  Facial motor strength is symmetric and tongue and uvula move  midline.  Motor exam:  Normal tone ,muscle bulk and symmetric strength in all extremities. Patient has a strong grip.   Sensory:  Fine touch, pinprick and vibration were tested in all extremities. Vibration is decreased in both feet beginning at the ankle.  Proprioception is  normal. Coordination: Rapid alternating movements in the fingers/hands is tested and normal.  Finger-to-nose maneuver  with evidence of ataxia, dysmetria  but not  tremor. Gait and station: Patient walks without assistive device, climbs up to the exam table.  Strength within normal limits. Stance is wider based , stable .  Steps are unfragmented. Romberg testing is normal- yet the patient had several falls over the last 6 month ( 3 or 4) . Deep tendon reflexes: in the upper and lower extremities are attenuated, the right knee has been hurting and patella reflex is attenuated.   Babinski maneuver response is downgoing.   Assessment:  After physical and neurologic examination, review of laboratory studies, imaging, neurophysiology testing and pre-existing records, assessment is   1) Suffered more falls, 3 this month, but BP is actually high. Refuses a walker. Should be a walker with a seat.  The symptoms had begun 12 months earlier -she denied any vertigo sensation of spinning , she didn't like drinking water very much and he had taken her blood pressure pills in the morning - it seems that the medication in combination with the dehydration to her falls and dizziness.  The dizziness now has  continued. Eliquis patientt  2) neuropathy related sensory loss and less gait stability, increased fall risk. Decreased proprioception.   3) beginning dementia 24-30 , consider aricept, which can influence her heart rate.     Assessment/Plan:    Needs to get out of the house, may be adult care center 2-3 times a week for companionship. She doesn't have regular contact physical with daughter children and grand children, mostly phone  contact. She is alone, lonely. She lost her best friend recently. She is actually interested in a senior citizen involvement. She still drives in daytime only . Consider antidepressant rather than aricept.    Larey Seat, MD   [facility] [address] [place], [place] [address]  Phone [phone] Fax [phone]  A total of 30 minutes was spent face-to-face with this patient. Over half this time was spent on counseling patient on the orthostatic hypotension diagnosis and different diagnostic and therapeutic options available.   Marland Kitchen

## 2016-01-23 NOTE — Addendum Note (Signed)
Addended by: Larey Seat on: 01/23/2016 11:22 AM   Modules accepted: Orders

## 2016-01-30 ENCOUNTER — Ambulatory Visit: Payer: Self-pay | Admitting: Podiatry

## 2016-02-02 ENCOUNTER — Encounter: Payer: Self-pay | Admitting: Podiatry

## 2016-02-02 ENCOUNTER — Ambulatory Visit (INDEPENDENT_AMBULATORY_CARE_PROVIDER_SITE_OTHER): Payer: Medicare Other | Admitting: Podiatry

## 2016-02-02 DIAGNOSIS — M79673 Pain in unspecified foot: Secondary | ICD-10-CM | POA: Diagnosis not present

## 2016-02-02 DIAGNOSIS — B351 Tinea unguium: Secondary | ICD-10-CM

## 2016-02-02 NOTE — Progress Notes (Signed)
Patient ID: [name], female   DOB: 1928-01-23, 82 y.o.   MRN: [id number] Complaint:  Visit Type: Patient returns to my office for continued preventative foot care services. Complaint: Patient states" my nails have grown long and thick and become painful to walk and wear shoes" Patient has been diagnosed with DM .Marland Kitchen The patient presents for preventative foot care services. No changes to ROS.  She says she is experiencing pain at the outside left foot at the midfoot area.  Podiatric Exam: Vascular: dorsalis pedis and posterior tibial pulses are palpable bilateral. Capillary return is immediate. Temperature gradient is WNL. Skin turgor WNL  Sensorium: Normal Semmes Weinstein monofilament test. Normal tactile sensation bilaterally. Nail Exam: Pt has thick disfigured discolored nails with subungual debris noted bilateral entire nail hallux through fifth toenails.  She has pain medial border left hallux in the absence of infection. Ulcer Exam: There is no evidence of ulcer or pre-ulcerative changes or infection. Orthopedic Exam: Muscle tone and strength are WNL. No limitations in general ROM. No crepitus or effusions noted. Foot type and digits show no abnormalities. Bony prominences are unremarkable at the fifth metabase B/L.   Lower leg swelling noted with venous stasis.Marland Kitchen No swelling at this time at this visit. Skin: No Porokeratosis. No infection or ulcers  Diagnosis:  Onychomycosis, , Pain in right toe, pain in left toes  Treatment & Plan Procedures and Treatment: Consent by patient was obtained for treatment procedures. The patient understood the discussion of treatment and procedures well. All questions were answered thoroughly reviewed. Debridement of mycotic and hypertrophic toenails, 1 through 5 bilateral and clearing of subungual debris. No ulceration, no infection noted.  Return Visit-Office Procedure: Patient instructed to return to the office for a follow up visit 3 months for continued  evaluation and treatment.   Gardiner Barefoot DPM

## 2016-02-05 ENCOUNTER — Telehealth: Payer: Self-pay | Admitting: *Deleted

## 2016-02-05 ENCOUNTER — Ambulatory Visit (HOSPITAL_COMMUNITY)
Admission: RE | Admit: 2016-02-05 | Discharge: 2016-02-05 | Disposition: A | Discharge status: Home / Self Care | Payer: Medicare Other | Source: Ambulatory Visit | Attending: Cardiology | Admitting: Cardiology

## 2016-02-05 DIAGNOSIS — E119 Type 2 diabetes mellitus without complications: Secondary | ICD-10-CM | POA: Diagnosis not present

## 2016-02-05 DIAGNOSIS — I739 Peripheral vascular disease, unspecified: Secondary | ICD-10-CM | POA: Diagnosis not present

## 2016-02-05 DIAGNOSIS — E785 Hyperlipidemia, unspecified: Secondary | ICD-10-CM | POA: Diagnosis not present

## 2016-02-05 DIAGNOSIS — I10 Essential (primary) hypertension: Secondary | ICD-10-CM | POA: Insufficient documentation

## 2016-02-05 DIAGNOSIS — R938 Abnormal findings on diagnostic imaging of other specified body structures: Secondary | ICD-10-CM | POA: Insufficient documentation

## 2016-02-05 DIAGNOSIS — R209 Unspecified disturbances of skin sensation: Secondary | ICD-10-CM

## 2016-02-05 MED ORDER — AMLODIPINE BESYLATE 5 MG PO TABS: 5.0000 mg | ORAL_TABLET | Freq: Every day | ORAL | Status: DC

## 2016-02-05 NOTE — Telephone Encounter (Signed)
Patient is in the office today for lower extremity doppler. Barnett Applebaum, ASKED FOR BLOOD PRESSURE  CHECK ,   BLOOD PRESSURE WAS 206/? per Barnett Applebaum RN checked blood pressure in left 200/70 No other symptoms, patient did take morning medications including lisinopril and furosemide.  reviewed information D.O.D.( DR Stanford Breed) Per Dr Stanford Breed,  Recheck blood pressure in 10 min , will give further orders Patient aware.  Patient sitting comfortable .  15 MIN -  BLOOD PRESSURE  190/80 Pulse  68 on left arm Information given to Dr Stanford Breed PER ORDER, ADD AMLODIPINE 5 MG, KEEP RECORDING OF BLOOD PRESSURE AND CONTACT OFFICE  2- 3WEEKS PATIENT VERBALIZED UNDERSTANDING FORWARD TO DR NO:[id number]

## 2016-02-07 ENCOUNTER — Encounter (INDEPENDENT_AMBULATORY_CARE_PROVIDER_SITE_OTHER): Payer: Medicare Other | Admitting: Ophthalmology

## 2016-02-07 DIAGNOSIS — H43813 Vitreous degeneration, bilateral: Secondary | ICD-10-CM

## 2016-02-07 DIAGNOSIS — I10 Essential (primary) hypertension: Secondary | ICD-10-CM | POA: Diagnosis not present

## 2016-02-07 DIAGNOSIS — E113312 Type 2 diabetes mellitus with moderate nonproliferative diabetic retinopathy with macular edema, left eye: Secondary | ICD-10-CM | POA: Diagnosis not present

## 2016-02-07 DIAGNOSIS — H35033 Hypertensive retinopathy, bilateral: Secondary | ICD-10-CM

## 2016-02-07 DIAGNOSIS — E11311 Type 2 diabetes mellitus with unspecified diabetic retinopathy with macular edema: Secondary | ICD-10-CM

## 2016-02-07 DIAGNOSIS — E113391 Type 2 diabetes mellitus with moderate nonproliferative diabetic retinopathy without macular edema, right eye: Secondary | ICD-10-CM

## 2016-02-09 DIAGNOSIS — I10 Essential (primary) hypertension: Secondary | ICD-10-CM | POA: Diagnosis not present

## 2016-02-09 DIAGNOSIS — E1151 Type 2 diabetes mellitus with diabetic peripheral angiopathy without gangrene: Secondary | ICD-10-CM | POA: Diagnosis not present

## 2016-02-09 DIAGNOSIS — E784 Other hyperlipidemia: Secondary | ICD-10-CM | POA: Diagnosis not present

## 2016-02-16 DIAGNOSIS — N183 Chronic kidney disease, stage 3 (moderate): Secondary | ICD-10-CM | POA: Diagnosis not present

## 2016-02-16 DIAGNOSIS — D6489 Other specified anemias: Secondary | ICD-10-CM | POA: Diagnosis not present

## 2016-02-16 DIAGNOSIS — I4892 Unspecified atrial flutter: Secondary | ICD-10-CM | POA: Diagnosis not present

## 2016-02-16 DIAGNOSIS — G4733 Obstructive sleep apnea (adult) (pediatric): Secondary | ICD-10-CM | POA: Diagnosis not present

## 2016-02-16 DIAGNOSIS — M25561 Pain in right knee: Secondary | ICD-10-CM | POA: Diagnosis not present

## 2016-02-16 DIAGNOSIS — Z0000 Encounter for general adult medical examination without abnormal findings: Secondary | ICD-10-CM | POA: Diagnosis not present

## 2016-02-16 DIAGNOSIS — R2689 Other abnormalities of gait and mobility: Secondary | ICD-10-CM | POA: Diagnosis not present

## 2016-02-16 DIAGNOSIS — I7389 Other specified peripheral vascular diseases: Secondary | ICD-10-CM | POA: Diagnosis not present

## 2016-02-16 DIAGNOSIS — Z1389 Encounter for screening for other disorder: Secondary | ICD-10-CM | POA: Diagnosis not present

## 2016-02-16 DIAGNOSIS — E1151 Type 2 diabetes mellitus with diabetic peripheral angiopathy without gangrene: Secondary | ICD-10-CM | POA: Diagnosis not present

## 2016-02-16 DIAGNOSIS — Z6820 Body mass index (BMI) 20.0-20.9, adult: Secondary | ICD-10-CM | POA: Diagnosis not present

## 2016-02-16 DIAGNOSIS — C50912 Malignant neoplasm of unspecified site of left female breast: Secondary | ICD-10-CM | POA: Diagnosis not present

## 2016-02-19 DIAGNOSIS — M25561 Pain in right knee: Secondary | ICD-10-CM | POA: Diagnosis not present

## 2016-02-24 DIAGNOSIS — M25561 Pain in right knee: Secondary | ICD-10-CM | POA: Diagnosis not present

## 2016-02-29 DIAGNOSIS — M25561 Pain in right knee: Secondary | ICD-10-CM | POA: Diagnosis not present

## 2016-04-08 ENCOUNTER — Other Ambulatory Visit (HOSPITAL_COMMUNITY): Payer: Self-pay | Admitting: Orthopedic Surgery

## 2016-04-08 DIAGNOSIS — M25561 Pain in right knee: Secondary | ICD-10-CM | POA: Diagnosis not present

## 2016-04-08 DIAGNOSIS — R609 Edema, unspecified: Secondary | ICD-10-CM

## 2016-04-09 ENCOUNTER — Ambulatory Visit (HOSPITAL_COMMUNITY)
Admission: RE | Admit: 2016-04-09 | Discharge: 2016-04-09 | Disposition: A | Discharge status: Home / Self Care | Payer: Medicare Other | Source: Ambulatory Visit | Attending: Internal Medicine | Admitting: Internal Medicine

## 2016-04-09 DIAGNOSIS — E785 Hyperlipidemia, unspecified: Secondary | ICD-10-CM | POA: Diagnosis not present

## 2016-04-09 DIAGNOSIS — R609 Edema, unspecified: Secondary | ICD-10-CM

## 2016-04-09 DIAGNOSIS — I10 Essential (primary) hypertension: Secondary | ICD-10-CM | POA: Diagnosis not present

## 2016-04-09 DIAGNOSIS — G4733 Obstructive sleep apnea (adult) (pediatric): Secondary | ICD-10-CM | POA: Diagnosis not present

## 2016-04-09 DIAGNOSIS — E119 Type 2 diabetes mellitus without complications: Secondary | ICD-10-CM | POA: Insufficient documentation

## 2016-04-09 DIAGNOSIS — I739 Peripheral vascular disease, unspecified: Secondary | ICD-10-CM | POA: Diagnosis not present

## 2016-04-09 NOTE — Progress Notes (Signed)
VASCULAR LAB PRELIMINARY  PRELIMINARY  PRELIMINARY  PRELIMINARY  Bilateral lower extremity venous duplex completed.    Preliminary report:  Bilateral:  No evidence of DVT or superficial thrombosis. There is an area of mixed echoes in the popliteal fossa measuring 3.61cm x 1.08 cm on the right and 3.10 cm x 0.85 cm on the left. Consistent with  Baker's cyst.   Joban Colledge, RVS 04/09/2016, 12:07 PM

## 2016-04-23 NOTE — Progress Notes (Signed)
Patient ID: Donna Owens, female   DOB: 1928/04/06, 82 y.o.   MRN: [id number] Patient ID: Donna Owens, female   DOB: Jun 02, 1928, 82 y.o.   MRN: [id number]    Cardiology Office Note    Date:  04/24/2016   ID:  Donna Owens, DOB Apr 23, 1928, MRN [id number]  PCP:  [name], MD  Cardiologist:   Sanda Klein, MD   Chief Complaint  Patient presents with  . Follow-up    had some Chest tightness recently, no shortness of breath, has edema, has pain or cramping in legs, has lightheaded or dizziness    History of Present Illness:  Donna Owens is a 82 y.o. female with long term persistent atrial fibrillation with slow ventricular response, diastolic heart failure, HTN, DM, hyperlipidemia, severe neuropathy with unsteady gait and frequent falls.   She had a lot of problems with pain in her knee. She underwent a knee injection, presumably with steroids and not long after developed bilateral severe lower extremity swelling and some mild dyspnea. She underwent venous Dopplers which did not show evidence of DVT and she was then treated with increased doses of diuretics which led to resolution of the edema. Has some mild swelling limited to the lower third of her calves and ankles.   She feels that her appetite is better and overall is doing well. She continues to live independently. She has a remote history of ischemic stroke. She has not had serious falls but did fall against the wall recently. Her anticoagulant was held for a while after one of her falls, but she is now back on Eliquis. None of her falls have been associated with serious injury or intracranial hemorrhage. Her weight loss has stabilized over the last few months. Her BMI is just over 21. Mini-Mental status performed in January showed findings compatible with mild dementia (score 24).  The dose of beta blocker has been progressively reduced due to slow ventricular response.  Past Medical History  Diagnosis Date  . Diabetes  mellitus   . Hypertension   . Cancer ([place])   . Hyperlipemia   . Pulmonary hypertension ([place])     moderate echo 11/07/10  . Carotid arterial disease ([place]) 12/13/11     mild by doppler  . PVD (peripheral vascular disease) ([place])   . OSA (obstructive sleep apnea)   . Anemia   . Atrophic vaginitis   . Sleep apnea   . Osteoporosis   . CVA (cerebral vascular accident) ([place])   . Blood transfusion without reported diagnosis   . OSA on CPAP 06/20/2014    Past Surgical History  Procedure Laterality Date  . Breast surgery      right 07/26/2000  . Breast lumpectomy Right     chemo/radiation  . Breast lumpectomy Left     benign  . Colonoscopy  2010  . Ingrown toenails      removed in 03/2013  . Cardioversion N/A 07/21/2014    Procedure: CARDIOVERSION;  Surgeon: Sanda Klein, MD;  Location: [facility];  Service: Cardiovascular;  Laterality: N/A;    Outpatient Prescriptions Prior to Visit  Medication Sig Dispense Refill  . acetaminophen (TYLENOL) 500 MG tablet Take 500 mg by mouth every 6 (six) hours as needed for moderate pain.    Marland Kitchen amLODipine (NORVASC) 5 MG tablet Take 1 tablet (5 mg total) by mouth daily. 30 tablet 6  . apixaban (ELIQUIS) 2.5 MG TABS tablet Take 1 tablet (2.5 mg total) by mouth 2 (two) times daily.  180 tablet 1  . calcium-vitamin D (OSCAL WITH D) 500-200 MG-UNIT per tablet Take 1 tablet by mouth daily with breakfast.     . carvedilol (COREG) 3.125 MG tablet Take 1 tablet (3.125 mg total) by mouth 2 (two) times daily with a meal. 180 tablet 1  . Cholecalciferol (VITAMIN D-3) 1000 UNITS CAPS Take 1 capsule by mouth daily.    . Coenzyme Q10 (CO Q 10) 100 MG CAPS Take 1 capsule by mouth daily.    . diazepam (VALIUM) 5 MG tablet Take 1 tablet (5 mg total) by mouth every 8 (eight) hours as needed (dizziness). 5 tablet 0  . gabapentin (NEURONTIN) 300 MG capsule Take 1 capsule (300 mg total) by mouth at bedtime. 90 capsule 11  . hydrOXYzine (ATARAX/VISTARIL) 25 MG tablet Take  1 tablet (25 mg total) by mouth 3 (three) times daily as needed for itching. 30 tablet 2  . lisinopril (PRINIVIL,ZESTRIL) 20 MG tablet TAKE 2 TABLETS BY MOUTH DAILY 180 tablet 3  . Polyethyl Glycol-Propyl Glycol (SYSTANE OP) Apply 1 drop to eye 2 (two) times daily. SYSTANE    . pravastatin (PRAVACHOL) 40 MG tablet Take 40 mg by mouth daily.    . promethazine (PHENERGAN) 25 MG tablet Take 25 mg by mouth daily. nausea    . triamcinolone cream (KENALOG) 0.1 % Apply 1 application topically 2 (two) times daily. (Patient taking differently: Apply 1 application topically 2 (two) times daily as needed (rash/sore). On left foot.) 30 g 0  . vitamin B-12 (CYANOCOBALAMIN) 1000 MCG tablet Take 1,000 mcg by mouth 2 (two) times daily.     . furosemide (LASIX) 20 MG tablet Take 1 tablet (20 mg total) by mouth 4 (four) times a week. TAKE ONE TABLET BY MOUTH MONDAYS/WEDNESDAY/FRIDAYS AND SATURDAYS 90 tablet 3  . metFORMIN (GLUCOPHAGE-XR) 500 MG 24 hr tablet Take 500 mg by mouth daily.      No facility-administered medications prior to visit.     Allergies:   Review of patient's allergies indicates no known allergies.   Social History   Social History  . Marital Status: Divorced    Spouse Name: N/A  . Number of Children: 1  . Years of Education: BSN   Occupational History  . RETIRED    Social History Main Topics  . Smoking status: Never Smoker   . Smokeless tobacco: Never Used  . Alcohol Use: No  . Drug Use: No  . Sexual Activity: No   Other Topics Concern  . None   Social History Narrative   Patient is divorced and lives alone.   Patient has one adult daughter.   Patient is retired.   Patient has a BSN degree in nursing.   Patient is right-handed.   Patient drinks one soda per week.     Family History:  The patient's family history includes Diabetes in her brother and other; Heart failure (age of onset: 10) in her daughter; Hypertension in her brother.   ROS:   Please see the history of  present illness.    Review of Systems  Constitution: Negative for chills, fever, malaise/fatigue and weight loss.  HENT: Negative for congestion, headaches and nosebleeds.   Eyes: Negative for blurred vision, vision loss in left eye and vision loss in right eye.  Cardiovascular: Positive for leg swelling. Negative for chest pain, claudication, orthopnea, paroxysmal nocturnal dyspnea and syncope.  Respiratory: Negative for cough, shortness of breath and wheezing.   Endocrine: Negative for cold intolerance, polydipsia and polyuria.  Hematologic/Lymphatic: Does not bruise/bleed easily.  Skin: Negative for poor wound healing and rash.  Musculoskeletal: Positive for arthritis and joint pain.  Gastrointestinal: Negative for abdominal pain, change in bowel habit, heartburn, hematemesis and melena.  Genitourinary: Negative for bladder incontinence and non-menstrual bleeding.  Neurological: Positive for light-headedness, loss of balance, numbness and paresthesias. Negative for focal weakness and vertigo.  Psychiatric/Behavioral: Negative for altered mental status, depression and memory loss.   All other systems reviewed and are negative.   PHYSICAL EXAM:   VS:  BP 174/60 mmHg  Pulse 66  Ht 5\' 5"  (1.651 m)  Wt 59.024 kg (130 lb 2 oz)  BMI 21.65 kg/m2   GEN: Well nourished, well developed, in no acute distress HEENT: normal Neck: no JVD, carotid bruits, or masses Cardiac: irregular, no murmurs, rubs, or gallops,no edema , diminished pedal pulses bilaterally Respiratory:  clear to auscultation bilaterally, normal work of breathing GI: soft, nontender, nondistended, + BS MS: no deformity or atrophy Skin: warm and dry, no rash Neuro:  Alert and Oriented x 3, Strength and sensation are intact Psych: euthymic mood, full affect  Wt Readings from Last 3 Encounters:  04/24/16 59.024 kg (130 lb 2 oz)  01/23/16 57.607 kg (127 lb)  01/22/16 58.196 kg (128 lb 4.8 oz)      Studies/Labs Reviewed:     EKG:  EKG is not ordered today.   Recent Labs: 07/21/2015: ALT 17; TSH 0.863 12/17/2015: BUN 26*; Creatinine, Ser 1.37*; Hemoglobin 10.9*; Platelets 170; Potassium 4.2; Sodium 141   Lipid Panel    Component Value Date/Time   CHOL 139 02/02/2014 1106   TRIG 47 02/02/2014 1106   HDL 54 02/02/2014 1106   CHOLHDL 2.6 02/02/2014 1106   VLDL 9 02/02/2014 1106   LDLCALC 76 02/02/2014 1106     ASSESSMENT:    1. Atrial fibrillation with slow ventricular response ([place])   2. Chronic diastolic heart failure ([place])   3. Essential hypertension   4. Dyslipidemia   5. PAD (peripheral artery disease) ([place])   6. OSA on CPAP   7. Type 2 diabetes mellitus with diabetic peripheral angiopathy without gangrene, without long-term current use of insulin ([place])   8.      Possible dementia   PLAN:  In order of problems listed above:  1. Afib: CHADSVasc score 9 is very high , but she has had multiple falls and continues to have unsteady gait. On anticoagulation. Avoid ASA  And NSAIDs. If she has continued problems with falls, may have to decide to stop anticoagulation permanently. May have to refer for Watchman left atrial appendage closure device, but her age and apparent cognitive decline may make her less suited for invasive intervention. She has excellent rate control on a very low dose of beta blocker 2. CHF: Clinically mildly hypervolemic, NYHA functional class I-II. Recent fluid retention may have been a side effect of steroids. It seems to be better already. Limited more by her balance problems then any symptoms of heart failure. Increase furosemide to 5 days a week. 3. HTN: At home her systolic blood pressure is generally in normal range. Today it is moderately elevated. Avoid "perfect" blood pressure control since she appears to have symptoms and signs of orthostatic hypotension and falls.  4. HLP: Satisfactory lipid profile 5. PAD: She has mostly infrapopliteal disease (lateral two-vessel  runoff, ABI 1.1 on right 0.91 on left, 2014). I don't think her neuropathy is ischemic, but worthwhile repeating ankle brachial indices and a full evaluation  if there is evidence of progression of arterial insufficiency. She did not have renal artery stenosis by ultrasound in 2015. She has an appointment with Dr. Brett Fairy tomorrow. 6. OSA: seems to have resolved after marked weight loss 7. DM: Reports excellent control, on a single metformin tablet daily 8. Note that a Mini-Mental status exam performed on January 31 her neurologist showed a score of 24, indicative of mild dementia. She has burned a couple of meals on her stove because she was "distracted by the telephone". I wonder about her ability to comply with a complex regimen of medications.    Medication Adjustments/Labs and Tests Ordered: Current medicines are reviewed at length with the patient today.  Concerns regarding medicines are outlined above.  Medication changes, Labs and Tests ordered today are listed in the Patient Instructions below. Patient Instructions  Dr Sallyanne Kuster has recommended making the following medication changes: 1. INCREASE Furosemide 20 mg - take 1 tablet 5 days a week  Dr Sallyanne Kuster recommends that you schedule a follow-up appointment in 3 months.  If you need a refill on your cardiac medications before your next appointment, please call your pharmacy.     Mikael Spray, MD  04/24/2016 2:05 PM    [facility] [address], [place], [place]  [address] Phone: [phone]; Fax: [phone]

## 2016-04-24 ENCOUNTER — Ambulatory Visit (INDEPENDENT_AMBULATORY_CARE_PROVIDER_SITE_OTHER): Payer: Medicare Other | Admitting: Cardiovascular Disease

## 2016-04-24 ENCOUNTER — Encounter: Payer: Self-pay | Admitting: Cardiovascular Disease

## 2016-04-24 VITALS — BP 174/60 | HR 66 | Ht 65.0 in | Wt 130.1 lb

## 2016-04-24 DIAGNOSIS — I4891 Unspecified atrial fibrillation: Secondary | ICD-10-CM | POA: Diagnosis not present

## 2016-04-24 DIAGNOSIS — I739 Peripheral vascular disease, unspecified: Secondary | ICD-10-CM

## 2016-04-24 DIAGNOSIS — I5032 Chronic diastolic (congestive) heart failure: Secondary | ICD-10-CM | POA: Diagnosis not present

## 2016-04-24 DIAGNOSIS — E785 Hyperlipidemia, unspecified: Secondary | ICD-10-CM

## 2016-04-24 DIAGNOSIS — Z9989 Dependence on other enabling machines and devices: Secondary | ICD-10-CM

## 2016-04-24 DIAGNOSIS — E1151 Type 2 diabetes mellitus with diabetic peripheral angiopathy without gangrene: Secondary | ICD-10-CM

## 2016-04-24 DIAGNOSIS — I10 Essential (primary) hypertension: Secondary | ICD-10-CM

## 2016-04-24 DIAGNOSIS — G4733 Obstructive sleep apnea (adult) (pediatric): Secondary | ICD-10-CM

## 2016-04-24 MED ORDER — FUROSEMIDE 20 MG PO TABS: ORAL_TABLET | ORAL | Status: DC

## 2016-04-24 NOTE — Patient Instructions (Signed)
Dr Sallyanne Kuster has recommended making the following medication changes: 1. INCREASE Furosemide 20 mg - take 1 tablet 5 days a week  Dr Sallyanne Kuster recommends that you schedule a follow-up appointment in 3 months.  If you need a refill on your cardiac medications before your next appointment, please call your pharmacy.

## 2016-05-07 DIAGNOSIS — H40013 Open angle with borderline findings, low risk, bilateral: Secondary | ICD-10-CM | POA: Diagnosis not present

## 2016-05-07 DIAGNOSIS — H10413 Chronic giant papillary conjunctivitis, bilateral: Secondary | ICD-10-CM | POA: Diagnosis not present

## 2016-05-07 DIAGNOSIS — E113313 Type 2 diabetes mellitus with moderate nonproliferative diabetic retinopathy with macular edema, bilateral: Secondary | ICD-10-CM | POA: Diagnosis not present

## 2016-05-07 DIAGNOSIS — Z961 Presence of intraocular lens: Secondary | ICD-10-CM | POA: Diagnosis not present

## 2016-05-07 DIAGNOSIS — H04123 Dry eye syndrome of bilateral lacrimal glands: Secondary | ICD-10-CM | POA: Diagnosis not present

## 2016-05-13 ENCOUNTER — Other Ambulatory Visit: Payer: Self-pay | Admitting: Cardiovascular Disease

## 2016-05-14 ENCOUNTER — Ambulatory Visit (INDEPENDENT_AMBULATORY_CARE_PROVIDER_SITE_OTHER): Payer: Medicare Other | Admitting: Podiatry

## 2016-05-14 ENCOUNTER — Encounter: Payer: Self-pay | Admitting: Podiatry

## 2016-05-14 DIAGNOSIS — B351 Tinea unguium: Secondary | ICD-10-CM

## 2016-05-14 DIAGNOSIS — M79673 Pain in unspecified foot: Secondary | ICD-10-CM

## 2016-05-14 NOTE — Progress Notes (Signed)
Patient ID: Donna Owens, female   DOB: 05-Jul-1928, 82 y.o.   MRN: [id number] Complaint:  Visit Type: Patient returns to my office for continued preventative foot care services. Complaint: Patient states" my nails have grown long and thick and become painful to walk and wear shoes" Patient has been diagnosed with DM .Marland Kitchen The patient presents for preventative foot care services. No changes to ROS.  Blister noted at base 4,5 toes left foot.  Podiatric Exam: Vascular: dorsalis pedis and posterior tibial pulses are palpable bilateral. Capillary return is immediate. Temperature gradient is WNL. Skin turgor WNL  Sensorium: Normal Semmes Weinstein monofilament test. Normal tactile sensation bilaterally. Nail Exam: Pt has thick disfigured discolored nails with subungual debris noted bilateral entire nail hallux through fifth toenails.  She has pain medial border left hallux in the absence of infection. Ulcer Exam: There is no evidence of ulcer or pre-ulcerative changes or infection. Orthopedic Exam: Muscle tone and strength are WNL. No limitations in general ROM. No crepitus or effusions noted. Foot type and digits show no abnormalities. Bony prominences are unremarkable at the fifth metabase B/L.    Skin: No Porokeratosis. No infection or ulcers.  Blister dorsum left foot base 4,5 left foot.  Diagnosis:  Onychomycosis, , Pain in right toe, pain in left toes  Treatment & Plan Procedures and Treatment: Consent by patient was obtained for treatment procedures. The patient understood the discussion of treatment and procedures well. All questions were answered thoroughly reviewed. Debridement of mycotic and hypertrophic toenails, 1 through 5 bilateral and clearing of subungual debris. No ulceration, no infection noted.  Return Visit-Office Procedure: Patient instructed to return to the office for a follow up visit 3 months for continued evaluation and treatment.   Gardiner Barefoot DPM

## 2016-05-31 ENCOUNTER — Ambulatory Visit: Payer: Self-pay | Admitting: Podiatry

## 2016-06-12 ENCOUNTER — Other Ambulatory Visit: Payer: Self-pay

## 2016-06-12 DIAGNOSIS — N644 Mastodynia: Secondary | ICD-10-CM

## 2016-06-14 ENCOUNTER — Telehealth: Payer: Self-pay | Admitting: Cardiovascular Disease

## 2016-06-14 NOTE — Telephone Encounter (Signed)
Spoke with pt, she reports elevated blood pressures sine tuesday last week. She reports bp of 189 to 190 and this am it is 204/96. She has a headache and seems quit anxious on the phone. She has not taken her medications this morning. Advised pt to take her medications, she will call back in 1 to 2 hours to let us know what her bp is.

## 2016-06-14 NOTE — Telephone Encounter (Signed)
New message   Pt c/o BP issue: STAT if pt c/o blurred vision, one-sided weakness or slurred speech  1. What are your last 5 BP readings? 204/96  HR 68 2. Are you having any other symptoms (ex. Dizziness, headache, blurred vision, passed out)? Headache and she said she is shaking and scared 3. What is your BP issue? High   pt is stuttering and speech trembled (not sure if this is how she normally speaks) Sending as high priority per [place]

## 2016-06-14 NOTE — Telephone Encounter (Signed)
Received call back from patient.She stated she feels better.She took her medications.B/P is now 155/76 pulse 76.Advised to continue monitor B/P and call back if B/P elevated.Keep appointment with Dr.Croitoru 07/29/16 at 11:15 am.

## 2016-06-14 NOTE — Telephone Encounter (Signed)
Follow up ° ° ° ° ° °Returning a call to the nurse °

## 2016-06-19 ENCOUNTER — Emergency Department (HOSPITAL_COMMUNITY)
Admission: EM | Admit: 2016-06-19 | Discharge: 2016-06-19 | Disposition: A | Discharge status: Home / Self Care | Payer: Medicare Other | Attending: Emergency Medicine | Admitting: Emergency Medicine

## 2016-06-19 ENCOUNTER — Emergency Department (HOSPITAL_COMMUNITY): Payer: Medicare Other

## 2016-06-19 ENCOUNTER — Encounter (HOSPITAL_COMMUNITY): Payer: Self-pay | Admitting: Emergency Medicine

## 2016-06-19 DIAGNOSIS — Z7984 Long term (current) use of oral hypoglycemic drugs: Secondary | ICD-10-CM | POA: Diagnosis not present

## 2016-06-19 DIAGNOSIS — R519 Headache, unspecified: Secondary | ICD-10-CM

## 2016-06-19 DIAGNOSIS — R51 Headache: Secondary | ICD-10-CM

## 2016-06-19 DIAGNOSIS — I2510 Atherosclerotic heart disease of native coronary artery without angina pectoris: Secondary | ICD-10-CM | POA: Diagnosis not present

## 2016-06-19 DIAGNOSIS — Z8673 Personal history of transient ischemic attack (TIA), and cerebral infarction without residual deficits: Secondary | ICD-10-CM | POA: Diagnosis not present

## 2016-06-19 DIAGNOSIS — M810 Age-related osteoporosis without current pathological fracture: Secondary | ICD-10-CM | POA: Diagnosis not present

## 2016-06-19 DIAGNOSIS — Z79899 Other long term (current) drug therapy: Secondary | ICD-10-CM | POA: Insufficient documentation

## 2016-06-19 DIAGNOSIS — Z792 Long term (current) use of antibiotics: Secondary | ICD-10-CM | POA: Diagnosis not present

## 2016-06-19 DIAGNOSIS — E1151 Type 2 diabetes mellitus with diabetic peripheral angiopathy without gangrene: Secondary | ICD-10-CM | POA: Insufficient documentation

## 2016-06-19 DIAGNOSIS — I10 Essential (primary) hypertension: Secondary | ICD-10-CM | POA: Insufficient documentation

## 2016-06-19 DIAGNOSIS — I6521 Occlusion and stenosis of right carotid artery: Secondary | ICD-10-CM | POA: Diagnosis not present

## 2016-06-19 DIAGNOSIS — E785 Hyperlipidemia, unspecified: Secondary | ICD-10-CM | POA: Insufficient documentation

## 2016-06-19 DIAGNOSIS — I6501 Occlusion and stenosis of right vertebral artery: Secondary | ICD-10-CM | POA: Diagnosis not present

## 2016-06-19 DIAGNOSIS — I6503 Occlusion and stenosis of bilateral vertebral arteries: Secondary | ICD-10-CM | POA: Diagnosis not present

## 2016-06-19 DIAGNOSIS — Z859 Personal history of malignant neoplasm, unspecified: Secondary | ICD-10-CM | POA: Insufficient documentation

## 2016-06-19 DIAGNOSIS — Z8679 Personal history of other diseases of the circulatory system: Secondary | ICD-10-CM | POA: Diagnosis not present

## 2016-06-19 DIAGNOSIS — I6502 Occlusion and stenosis of left vertebral artery: Secondary | ICD-10-CM | POA: Diagnosis not present

## 2016-06-19 LAB — CBC WITH DIFFERENTIAL/PLATELET
BASOS ABS: 0 10*3/uL (ref 0.0–0.1)
BASOS PCT: 0 %
Eosinophils Absolute: 0.1 10*3/uL (ref 0.0–0.7)
Eosinophils Relative: 2 %
HEMATOCRIT: 34.6 % — AB (ref 36.0–46.0)
HEMOGLOBIN: 11.6 g/dL — AB (ref 12.0–15.0)
Lymphocytes Relative: 20 %
Lymphs Abs: 1.1 10*3/uL (ref 0.7–4.0)
MCH: 29.9 pg (ref 26.0–34.0)
MCHC: 33.5 g/dL (ref 30.0–36.0)
MCV: 89.2 fL (ref 78.0–100.0)
MONOS PCT: 10 %
Monocytes Absolute: 0.6 10*3/uL (ref 0.1–1.0)
NEUTROS ABS: 4 10*3/uL (ref 1.7–7.7)
NEUTROS PCT: 68 %
Platelets: 148 10*3/uL — ABNORMAL LOW (ref 150–400)
RBC: 3.88 MIL/uL (ref 3.87–5.11)
RDW: 14.6 % (ref 11.5–15.5)
WBC: 5.8 10*3/uL (ref 4.0–10.5)

## 2016-06-19 LAB — URINALYSIS, ROUTINE W REFLEX MICROSCOPIC
Bilirubin Urine: NEGATIVE
Glucose, UA: NEGATIVE mg/dL
Hgb urine dipstick: NEGATIVE
Ketones, ur: NEGATIVE mg/dL
Leukocytes, UA: NEGATIVE
Nitrite: NEGATIVE
Protein, ur: NEGATIVE mg/dL
Specific Gravity, Urine: 1.008 (ref 1.005–1.030)
pH: 6 (ref 5.0–8.0)

## 2016-06-19 LAB — BASIC METABOLIC PANEL
ANION GAP: 8 (ref 5–15)
BUN: 26 mg/dL — ABNORMAL HIGH (ref 6–20)
CALCIUM: 9.4 mg/dL (ref 8.9–10.3)
CO2: 22 mmol/L (ref 22–32)
CREATININE: 1.35 mg/dL — AB (ref 0.44–1.00)
Chloride: 108 mmol/L (ref 101–111)
GFR, EST AFRICAN AMERICAN: 40 mL/min — AB (ref >60)
GFR, EST NON AFRICAN AMERICAN: 34 mL/min — AB (ref >60)
Glucose, Bld: 91 mg/dL (ref 65–99)
Potassium: 4.2 mmol/L (ref 3.5–5.1)
SODIUM: 138 mmol/L (ref 135–145)

## 2016-06-19 LAB — TROPONIN I: Troponin I: <0.03 ng/mL (ref <0.03)

## 2016-06-19 LAB — PROTIME-INR
INR: 1.27 (ref 0.00–1.49)
Prothrombin Time: 15.6 seconds — ABNORMAL HIGH (ref 11.6–15.2)

## 2016-06-19 LAB — SEDIMENTATION RATE: Sed Rate: 12 mm/hr (ref 0–22)

## 2016-06-19 MED ORDER — TRAMADOL HCL 50 MG PO TABS
50.0000 mg | ORAL_TABLET | Freq: Once | ORAL | Status: AC
Start: 2016-06-19 — End: 2016-06-19
  Administered 2016-06-19: 50 mg via ORAL
  Filled 2016-06-19: qty 1

## 2016-06-19 MED ORDER — SODIUM CHLORIDE 0.9 % IV BOLUS (SEPSIS)
500.0000 mL | Freq: Once | INTRAVENOUS | Status: AC
  Administered 2016-06-19: 500 mL via INTRAVENOUS

## 2016-06-19 MED ORDER — TRAMADOL HCL 50 MG PO TABS: 50.0000 mg | ORAL_TABLET | Freq: Four times a day (QID) | ORAL | Status: DC | PRN

## 2016-06-19 MED ORDER — SODIUM CHLORIDE 0.9 % IV BOLUS (SEPSIS): 1000.0000 mL | Freq: Once | INTRAVENOUS | Status: DC

## 2016-06-19 MED ORDER — IOPAMIDOL (ISOVUE-370) INJECTION 76%
100.0000 mL | Freq: Once | INTRAVENOUS | Status: AC | PRN
  Administered 2016-06-19: 80 mL via INTRAVENOUS

## 2016-06-19 MED ORDER — FENTANYL CITRATE (PF) 100 MCG/2ML IJ SOLN
50.0000 ug | Freq: Once | INTRAMUSCULAR | Status: AC
  Administered 2016-06-19: 50 ug via INTRAVENOUS
  Filled 2016-06-19: qty 2

## 2016-06-19 NOTE — Discharge Instructions (Signed)
We saw you in the ER for headaches. All the labs and imaging are normal. We are not sure what is causing your headaches, however, there appears to be no evidence of infection, bleeds or tumors based on our exam and results.  See your doctor or the neurologist if the pain persists, as you might need better medications or a specialist.  As discussed, see your doctor about the narrowing of the blood vessels as well, since they might have to keep an eye on it OR send you to a pecialist.   General Headache Without Cause A headache is pain or discomfort felt around the head or neck area. The specific cause of a headache may not be found. There are many causes and types of headaches. A few common ones are:  Tension headaches.  Migraine headaches.  Cluster headaches.  Chronic daily headaches. HOME CARE INSTRUCTIONS  Watch your condition for any changes. Take these steps to help with your condition: Managing Pain  Take over-the-counter and prescription medicines only as told by your health care provider.  Lie down in a dark, quiet room when you have a headache.  If directed, apply ice to the head and neck area:  Put ice in a plastic bag.  Place a towel between your skin and the bag.  Leave the ice on for 20 minutes, 2-3 times per day.  Use a heating pad or hot shower to apply heat to the head and neck area as told by your health care provider.  Keep lights dim if bright lights bother you or make your headaches worse. Eating and Drinking  Eat meals on a regular schedule.  Limit alcohol use.  Decrease the amount of caffeine you drink, or stop drinking caffeine. General Instructions  Keep all follow-up visits as told by your health care provider. This is important.  Keep a headache journal to help find out what may trigger your headaches. For example, write down:  What you eat and drink.  How much sleep you get.  Any change to your diet or medicines.  Try massage or other  relaxation techniques.  Limit stress.  Sit up straight, and do not tense your muscles.  Do not use tobacco products, including cigarettes, chewing tobacco, or e-cigarettes. If you need help quitting, ask your health care provider.  Exercise regularly as told by your health care provider.  Sleep on a regular schedule. Get 7-9 hours of sleep, or the amount recommended by your health care provider. SEEK MEDICAL CARE IF:   Your symptoms are not helped by medicine.  You have a headache that is different from the usual headache.  You have nausea or you vomit.  You have a fever. SEEK IMMEDIATE MEDICAL CARE IF:   Your headache becomes severe.  You have repeated vomiting.  You have a stiff neck.  You have a loss of vision.  You have problems with speech.  You have pain in the eye or ear.  You have muscular weakness or loss of muscle control.  You lose your balance or have trouble walking.  You feel faint or pass out.  You have confusion.   This information is not intended to replace advice given to you by your health care provider. Make sure you discuss any questions you have with your health care provider.   Document Released: 12/09/2005 Document Revised: 08/30/2015 Document Reviewed: 04/03/2015 Elsevier Interactive Patient Education [employer].

## 2016-06-19 NOTE — ED Notes (Signed)
Pt states she has had intermittent chest pain for the past week. Pt denies chest pain at this time.

## 2016-06-19 NOTE — ED Notes (Addendum)
Pt c/o headaches x several months, progressively worsening. No new symptoms today. Has not been evaluated.

## 2016-06-19 NOTE — ED Provider Notes (Signed)
CSN: [id number]     Arrival date & time 06/19/16  1225 History   First MD Initiated Contact with Patient 06/19/16 1342     Chief Complaint  Patient presents with  . Headache     (Consider location/radiation/quality/duration/timing/severity/associated sxs/prior Treatment) HPI Comments: Pt comes in with cc of headaches She is having headache x 1 month. Headache is frontal and occipital and moving down her neck bilaterally. Headaches are constant. Throbbing type headache, pain better with tylenol, no specific aggravating factor. Pt denies any associated numbness, tingling, no new dizziness, weakness. Pt has poor vision, but she is not sure if it is directly related. No recent trauma/falls.  Pt also having chest pain intermittent x 1 week. Chest pain is sharp and radiates to the neck and shoulder. Chest pain has no specific aggravating or relieving factors. CP lasts for few minutes. No known CAD hx.  PMhx of long term persistent atrial fibrillation with slow ventricular response, diastolic heart failure, HTN, DM, hyperlipidemia, severe neuropathy with unsteady gait and frequent falls.    ROS 10 Systems reviewed and are negative for acute change except as noted in the HPI.     Patient is a 82 y.o. female presenting with headaches. The history is provided by the patient.  Headache   Past Medical History  Diagnosis Date  . Diabetes mellitus   . Hypertension   . Cancer ([place])   . Hyperlipemia   . Pulmonary hypertension ([place])     moderate echo 11/07/10  . Carotid arterial disease ([place]) 12/13/11     mild by doppler  . PVD (peripheral vascular disease) ([place])   . OSA (obstructive sleep apnea)   . Anemia   . Atrophic vaginitis   . Sleep apnea   . Osteoporosis   . CVA (cerebral vascular accident) ([place])   . Blood transfusion without reported diagnosis   . OSA on CPAP 06/20/2014   Past Surgical History  Procedure Laterality Date  . Breast surgery      right 07/26/2000  . Breast  lumpectomy Right     chemo/radiation  . Breast lumpectomy Left     benign  . Colonoscopy  2010  . Ingrown toenails      removed in 03/2013  . Cardioversion N/A 07/21/2014    Procedure: CARDIOVERSION;  Surgeon: Sanda Klein, MD;  Location: [facility];  Service: Cardiovascular;  Laterality: N/A;   Family History  Problem Relation Age of Onset  . Diabetes Other   . Diabetes Brother   . Hypertension Brother   . Heart failure Daughter 19   Social History  Substance Use Topics  . Smoking status: Never Smoker   . Smokeless tobacco: Never Used  . Alcohol Use: No   OB History    Gravida Para Term Preterm AB TAB SAB Ectopic Multiple Living   4 2   2  2   1      Review of Systems  Neurological: Positive for headaches.      Allergies  Review of patient's allergies indicates no known allergies.  Home Medications   Prior to Admission medications   Medication Sig Start Date End Date Taking? Authorizing Provider  acetaminophen (TYLENOL) 500 MG tablet Take 500 mg by mouth every 6 (six) hours as needed for moderate pain.   Yes Historical Provider, MD  amLODipine (NORVASC) 5 MG tablet Take 1 tablet (5 mg total) by mouth daily. 02/05/16  Yes Lelon Perla, MD  calcium-vitamin D (OSCAL WITH D) 500-200 MG-UNIT per tablet  Take 1 tablet by mouth daily with breakfast.    Yes Historical Provider, MD  carvedilol (COREG) 3.125 MG tablet Take 1 tablet (3.125 mg total) by mouth 2 (two) times daily with a meal. 10/16/15  Yes Mihai Croitoru, MD  Cholecalciferol (VITAMIN D-3) 1000 UNITS CAPS Take 1 capsule by mouth daily.   Yes Historical Provider, MD  Coenzyme Q10 (CO Q 10) 100 MG CAPS Take 1 capsule by mouth daily.   Yes Historical Provider, MD  diazepam (VALIUM) 5 MG tablet Take 1 tablet (5 mg total) by mouth every 8 (eight) hours as needed (dizziness). 04/18/15  Yes Merryl Hacker, MD  ELIQUIS 2.5 MG TABS tablet TAKE 1 TABLET BY MOUTH TWICE DAILY Patient taking differently: TAKE 2.5 mg BY  MOUTH TWICE DAILY 05/13/16  Yes Mihai Croitoru, MD  ferrous sulfate 325 (65 FE) MG tablet Take 1 tablet by mouth daily. 02/16/16  Yes Historical Provider, MD  furosemide (LASIX) 20 MG tablet Take 1 tablet (20 mg total) by mouth 5 days a week. 04/24/16  Yes Mihai Croitoru, MD  hydrOXYzine (ATARAX/VISTARIL) 25 MG tablet Take 1 tablet (25 mg total) by mouth 3 (three) times daily as needed for itching. 07/20/15  Yes Asencion Partridge Dohmeier, MD  lisinopril (PRINIVIL,ZESTRIL) 20 MG tablet TAKE 2 TABLETS BY MOUTH DAILY Patient taking differently: TAKE 20mg   BY MOUTH twice DAILY 11/10/15  Yes Minus Breeding, MD  metFORMIN (GLUCOPHAGE) 500 MG tablet Take 1 tablet by mouth daily. 04/08/16  Yes Historical Provider, MD  Polyethyl Glycol-Propyl Glycol (SYSTANE OP) Place 1 drop into both eyes 2 (two) times daily. SYSTANE   Yes Historical Provider, MD  pravastatin (PRAVACHOL) 40 MG tablet Take 40 mg by mouth daily.   Yes Historical Provider, MD  promethazine (PHENERGAN) 25 MG tablet Take 25 mg by mouth daily. nausea   Yes Historical Provider, MD  triamcinolone cream (KENALOG) 0.1 % Apply 1 application topically 2 (two) times daily. Patient taking differently: Apply 1 application topically 2 (two) times daily as needed (rash/sore). On left foot. 01/12/15  Yes Trula Slade, DPM  vitamin B-12 (CYANOCOBALAMIN) 1000 MCG tablet Take 1,000 mcg by mouth 2 (two) times daily.    Yes Historical Provider, MD  amoxicillin (AMOXIL) 500 MG capsule Take 500-1,000 mg by mouth as directed. Reported on 06/19/2016 05/28/16   Historical Provider, MD  gabapentin (NEURONTIN) 300 MG capsule Take 1 capsule (300 mg total) by mouth at bedtime. Patient not taking: Reported on 06/19/2016 01/23/16   Larey Seat, MD  traMADol (ULTRAM) 50 MG tablet Take 1 tablet (50 mg total) by mouth every 6 (six) hours as needed. 06/19/16   Kiana Hollar, MD   BP 171/70 mmHg  Pulse 57  Temp(Src) 99.2 F (37.3 C) (Oral)  Resp 21  SpO2 98% Physical Exam   Constitutional: She is oriented to person, place, and time. She appears well-developed.  HENT:  Head: Normocephalic and atraumatic.  Eyes: Conjunctivae and EOM are normal. Pupils are equal, round, and reactive to light.  Neck: Normal range of motion. Neck supple.  Cardiovascular: Regular rhythm.   bradycardia  Pulmonary/Chest: Effort normal and breath sounds normal. No respiratory distress.  Abdominal: Soft. Bowel sounds are normal. She exhibits no distension. There is no tenderness. There is no rebound and no guarding.  Neurological: She is alert and oriented to person, place, and time. No cranial nerve deficit. Coordination normal.  Cerebellar exam is normal (finger to nose) Sensory exam normal for bilateral upper and lower extremities - and patient  is able to discriminate between sharp and dull. Motor exam is 4+/5   Skin: Skin is warm and dry.  Nursing note and vitals reviewed.   ED Course  Procedures (including critical care time) Labs Review Labs Reviewed  BASIC METABOLIC PANEL - Abnormal; Notable for the following:    BUN 26 (*)    Creatinine, Ser 1.35 (*)    GFR calc non Af Amer 34 (*)    GFR calc Af Amer 40 (*)    All other components within normal limits  PROTIME-INR - Abnormal; Notable for the following:    Prothrombin Time 15.6 (*)    All other components within normal limits  CBC WITH DIFFERENTIAL/PLATELET - Abnormal; Notable for the following:    Hemoglobin 11.6 (*)    HCT 34.6 (*)    Platelets 148 (*)    All other components within normal limits  URINALYSIS, ROUTINE W REFLEX MICROSCOPIC (NOT AT [facility])  TROPONIN I  SEDIMENTATION RATE  CBC WITH DIFFERENTIAL/PLATELET    Imaging Review Ct Angio Head W/cm &/or Wo Cm  06/19/2016  CLINICAL DATA:  Headache and neck pains for several days. EXAM: CT ANGIOGRAPHY HEAD AND NECK TECHNIQUE: Multidetector CT imaging of the head and neck was performed using the standard protocol during bolus administration of intravenous  contrast. Multiplanar CT image reconstructions and MIPs were obtained to evaluate the vascular anatomy. Carotid stenosis measurements (when applicable) are obtained utilizing NASCET criteria, using the distal internal carotid diameter as the denominator. CONTRAST:  100 mL Isovue 370 COMPARISON:  CT head without contrast 12/17/2015. MRI brain 01/25/2015. FINDINGS: CT HEAD Brain: Moderate atrophy and diffuse white matter disease is similar to the prior study. There are remote lacunar infarcts of the basal ganglia bilaterally without significant interval change. No acute hemorrhage or mass lesion is present. There is no significant extra-axial fluid collection. Calvarium and skull base: Calvarium is intact. Paranasal sinuses: The paranasal sinuses and mastoid air cells are clear. Orbits: The globes and orbits are intact. CTA NECK Aortic arch: A 3 vessel arch configuration is present. There dense calcifications at the origin of the left subclavian artery. The lumen is narrowed to 1.5 mm minimal diameter compared with 7 mm more distally. There is no significant atherosclerotic calcification at the left common carotid artery or innominate artery. Dense calcifications are present in the more distal aortic arch without aneurysm or dissection. Right carotid system: The right common carotid artery is mildly tortuous without significant stenosis. Dense atherosclerotic calcifications are present. The lumen is narrowed to 2.3 mm. This compares with a more distal measurement of 5.3 mm. There are no other significant stenoses in the cervical right ICA. Left carotid system: The left common carotid artery is mildly tortuous without significant stenosis. Distal calcifications an atherosclerotic change are noted. There are calcifications at the carotid bifurcation without a significant stenosis. The cervical left ICA is otherwise normal. Vertebral arteries:Both vertebral arteries originate from the subclavian arteries. There is a  dense calcification at the origin of the non dominant right vertebral artery. Moderate stenosis is present at the origin of the left vertebral artery. There is moderate tortuosity of the cervical vertebral arteries bilaterally without focal stenosis. Skeleton: Advanced degenerative changes are present throughout the cervical spine. Advanced facet hypertrophy is present bilaterally C2-3 C7. Slight anterolisthesis is present at C4-5 and C5-6. There is chronic loss of disc height at C5-6 and C6-7. No focal lytic or blastic lesions are present. Other neck: No focal mucosal or submucosal lesions are present.  Salivary glands are within normal limits bilaterally. The thyroid is heterogeneous without a dominant lesion. No significant adenopathy is present. The lung apices are clear. CTA HEAD Anterior circulation: Atherosclerotic calcifications are present within the cavernous internal carotid arteries bilaterally. There is no definite stenosis. The ICA termini are within normal limits bilaterally. The left A1 segment is dominant. The anterior communicating artery is patent. There is moderate narrowing in the distal M1 segments bilaterally, more distally on the right than left. The MCA bifurcations are otherwise intact. Proximally ACA and MCA branch vessels are within normal limits bilaterally. Posterior circulation: Moderate narrowing is present at the non dominant right vertebral artery at dural margin the calcification. PICA origins are visualized and normal bilaterally. The basilar artery is small. Both posterior cerebral arteries are of fetal type with small P1 segments bilaterally. There is moderate proximal narrowing of the right PCA. Distal PCA branch vessels are intact bilaterally. Venous sinuses: The dural sinuses are patent. The transverse sinuses are codominant. The straight sinus and deep cerebral veins are intact. The cortical veins are unremarkable. Anatomic variants: Bilateral fetal type posterior cerebral  arteries. Delayed phase: Postcontrast images demonstrate no pathologic enhancement. IMPRESSION: 1. Stable moderate atrophy and diffuse white matter disease. 2. Moderate stenosis of the proximal left subclavian artery. 3. Moderate to high-grade stenoses of the origins of both vertebral arteries. 4. Fetal type posterior cerebral arteries bilaterally. 5. Dense atherosclerotic calcifications of the distal common carotid arteries and carotid bifurcations bilaterally. 6. 50 - 60% stenosis of the proximal right internal carotid artery. 7. Atherosclerotic calcifications within the cavernous internal carotid arteries bilaterally without definite stenoses. 8. Moderate stenoses of the distal M1 segments bilaterally. 9. Moderate stenosis of the proximal right posterior cerebral artery. 10. Advanced spondylosis of the cervical spine. Electronically Signed   By: San Morelle M.D.   On: 06/19/2016 17:39   Ct Angio Neck W/cm &/or Wo/cm  06/19/2016  CLINICAL DATA:  Headache and neck pains for several days. EXAM: CT ANGIOGRAPHY HEAD AND NECK TECHNIQUE: Multidetector CT imaging of the head and neck was performed using the standard protocol during bolus administration of intravenous contrast. Multiplanar CT image reconstructions and MIPs were obtained to evaluate the vascular anatomy. Carotid stenosis measurements (when applicable) are obtained utilizing NASCET criteria, using the distal internal carotid diameter as the denominator. CONTRAST:  100 mL Isovue 370 COMPARISON:  CT head without contrast 12/17/2015. MRI brain 01/25/2015. FINDINGS: CT HEAD Brain: Moderate atrophy and diffuse white matter disease is similar to the prior study. There are remote lacunar infarcts of the basal ganglia bilaterally without significant interval change. No acute hemorrhage or mass lesion is present. There is no significant extra-axial fluid collection. Calvarium and skull base: Calvarium is intact. Paranasal sinuses: The paranasal sinuses  and mastoid air cells are clear. Orbits: The globes and orbits are intact. CTA NECK Aortic arch: A 3 vessel arch configuration is present. There dense calcifications at the origin of the left subclavian artery. The lumen is narrowed to 1.5 mm minimal diameter compared with 7 mm more distally. There is no significant atherosclerotic calcification at the left common carotid artery or innominate artery. Dense calcifications are present in the more distal aortic arch without aneurysm or dissection. Right carotid system: The right common carotid artery is mildly tortuous without significant stenosis. Dense atherosclerotic calcifications are present. The lumen is narrowed to 2.3 mm. This compares with a more distal measurement of 5.3 mm. There are no other significant stenoses in the cervical right ICA. Left  carotid system: The left common carotid artery is mildly tortuous without significant stenosis. Distal calcifications an atherosclerotic change are noted. There are calcifications at the carotid bifurcation without a significant stenosis. The cervical left ICA is otherwise normal. Vertebral arteries:Both vertebral arteries originate from the subclavian arteries. There is a dense calcification at the origin of the non dominant right vertebral artery. Moderate stenosis is present at the origin of the left vertebral artery. There is moderate tortuosity of the cervical vertebral arteries bilaterally without focal stenosis. Skeleton: Advanced degenerative changes are present throughout the cervical spine. Advanced facet hypertrophy is present bilaterally C2-3 C7. Slight anterolisthesis is present at C4-5 and C5-6. There is chronic loss of disc height at C5-6 and C6-7. No focal lytic or blastic lesions are present. Other neck: No focal mucosal or submucosal lesions are present. Salivary glands are within normal limits bilaterally. The thyroid is heterogeneous without a dominant lesion. No significant adenopathy is present.  The lung apices are clear. CTA HEAD Anterior circulation: Atherosclerotic calcifications are present within the cavernous internal carotid arteries bilaterally. There is no definite stenosis. The ICA termini are within normal limits bilaterally. The left A1 segment is dominant. The anterior communicating artery is patent. There is moderate narrowing in the distal M1 segments bilaterally, more distally on the right than left. The MCA bifurcations are otherwise intact. Proximally ACA and MCA branch vessels are within normal limits bilaterally. Posterior circulation: Moderate narrowing is present at the non dominant right vertebral artery at dural margin the calcification. PICA origins are visualized and normal bilaterally. The basilar artery is small. Both posterior cerebral arteries are of fetal type with small P1 segments bilaterally. There is moderate proximal narrowing of the right PCA. Distal PCA branch vessels are intact bilaterally. Venous sinuses: The dural sinuses are patent. The transverse sinuses are codominant. The straight sinus and deep cerebral veins are intact. The cortical veins are unremarkable. Anatomic variants: Bilateral fetal type posterior cerebral arteries. Delayed phase: Postcontrast images demonstrate no pathologic enhancement. IMPRESSION: 1. Stable moderate atrophy and diffuse white matter disease. 2. Moderate stenosis of the proximal left subclavian artery. 3. Moderate to high-grade stenoses of the origins of both vertebral arteries. 4. Fetal type posterior cerebral arteries bilaterally. 5. Dense atherosclerotic calcifications of the distal common carotid arteries and carotid bifurcations bilaterally. 6. 50 - 60% stenosis of the proximal right internal carotid artery. 7. Atherosclerotic calcifications within the cavernous internal carotid arteries bilaterally without definite stenoses. 8. Moderate stenoses of the distal M1 segments bilaterally. 9. Moderate stenosis of the proximal right  posterior cerebral artery. 10. Advanced spondylosis of the cervical spine. Electronically Signed   By: San Morelle M.D.   On: 06/19/2016 17:39   I have personally reviewed and evaluated these images and lab results as part of my medical decision-making.   EKG Interpretation   Date/Time:  Wednesday June 19 2016 14:10:26 EDT Ventricular Rate:  51 PR Interval:    QRS Duration: 90 QT Interval:  447 QTC Calculation: 412 R Axis:   46 Text Interpretation:  Atrial fibrillation Low voltage, extremity and  precordial leads Consider anterior infarct No acute changes Confirmed by  Kathrynn Humble, MD, Thelma Comp [phone]) on 06/19/2016 2:53:17 PM       @6 :25: CT scan results discussed, specifically the narrowing seen in her blood vessels that are not at critical level yet. Advised pcp f/u for optimal management and surveillance. Headache drastically improved post fentanyl. Will give her tramadol, as we are staying away from nsaids given eliquis use.  Pt will see Neurology if the pain persists.  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 ICH Carotid / Vertebral dissection Cavernous sinus thrombosis Tumor Vascular headaches AV malformation Brain aneurysm Muscular headaches Temporal arteritis  A/P: Pt comes in with cc of headaches. Pt has been having headache x 1 month and she is having some neck pain. CT angio ordered - no dissection seen, no large aneurysm or thrombosis seen. She does have narrowing of blood vessels in her neck. No clinical concerns for infectious process. Sed rate ordered and is neg, effectively ruling out temporal arteritis at this time. Will try to get pain in control and reassess. Neuro exam is non focal.  [name], MD 06/19/16 1840

## 2016-06-24 DIAGNOSIS — F5104 Psychophysiologic insomnia: Secondary | ICD-10-CM | POA: Diagnosis not present

## 2016-06-24 DIAGNOSIS — E1151 Type 2 diabetes mellitus with diabetic peripheral angiopathy without gangrene: Secondary | ICD-10-CM | POA: Diagnosis not present

## 2016-06-24 DIAGNOSIS — C50912 Malignant neoplasm of unspecified site of left female breast: Secondary | ICD-10-CM | POA: Diagnosis not present

## 2016-06-24 DIAGNOSIS — I7389 Other specified peripheral vascular diseases: Secondary | ICD-10-CM | POA: Diagnosis not present

## 2016-06-24 DIAGNOSIS — I10 Essential (primary) hypertension: Secondary | ICD-10-CM | POA: Diagnosis not present

## 2016-06-24 DIAGNOSIS — Z6820 Body mass index (BMI) 20.0-20.9, adult: Secondary | ICD-10-CM | POA: Diagnosis not present

## 2016-06-24 DIAGNOSIS — G4733 Obstructive sleep apnea (adult) (pediatric): Secondary | ICD-10-CM | POA: Diagnosis not present

## 2016-06-24 DIAGNOSIS — R51 Headache: Secondary | ICD-10-CM | POA: Diagnosis not present

## 2016-06-28 ENCOUNTER — Other Ambulatory Visit: Payer: Self-pay | Admitting: Internal Medicine

## 2016-07-14 DIAGNOSIS — L03119 Cellulitis of unspecified part of limb: Secondary | ICD-10-CM | POA: Diagnosis not present

## 2016-07-14 DIAGNOSIS — E13628 Other specified diabetes mellitus with other skin complications: Secondary | ICD-10-CM | POA: Diagnosis not present

## 2016-07-16 ENCOUNTER — Other Ambulatory Visit: Payer: Self-pay | Admitting: Vascular Surgery

## 2016-07-23 ENCOUNTER — Ambulatory Visit: Payer: Self-pay | Admitting: Adult Health

## 2016-07-25 ENCOUNTER — Encounter: Payer: Self-pay | Admitting: Vascular Surgery

## 2016-07-29 ENCOUNTER — Encounter: Payer: Self-pay | Admitting: Cardiovascular Disease

## 2016-07-29 ENCOUNTER — Encounter (INDEPENDENT_AMBULATORY_CARE_PROVIDER_SITE_OTHER): Payer: Self-pay

## 2016-07-29 ENCOUNTER — Ambulatory Visit (INDEPENDENT_AMBULATORY_CARE_PROVIDER_SITE_OTHER): Payer: Medicare Other | Admitting: Cardiovascular Disease

## 2016-07-29 VITALS — BP 142/66 | HR 56 | Ht 65.0 in | Wt 128.8 lb

## 2016-07-29 DIAGNOSIS — G3184 Mild cognitive impairment, so stated: Secondary | ICD-10-CM

## 2016-07-29 DIAGNOSIS — R296 Repeated falls: Secondary | ICD-10-CM

## 2016-07-29 DIAGNOSIS — I6503 Occlusion and stenosis of bilateral vertebral arteries: Secondary | ICD-10-CM

## 2016-07-29 DIAGNOSIS — E785 Hyperlipidemia, unspecified: Secondary | ICD-10-CM

## 2016-07-29 DIAGNOSIS — I4891 Unspecified atrial fibrillation: Secondary | ICD-10-CM | POA: Diagnosis not present

## 2016-07-29 DIAGNOSIS — I10 Essential (primary) hypertension: Secondary | ICD-10-CM | POA: Diagnosis not present

## 2016-07-29 DIAGNOSIS — E1151 Type 2 diabetes mellitus with diabetic peripheral angiopathy without gangrene: Secondary | ICD-10-CM

## 2016-07-29 DIAGNOSIS — I5032 Chronic diastolic (congestive) heart failure: Secondary | ICD-10-CM | POA: Diagnosis not present

## 2016-07-29 DIAGNOSIS — I739 Peripheral vascular disease, unspecified: Secondary | ICD-10-CM

## 2016-07-29 NOTE — Progress Notes (Signed)
Patient ID: Donna Owens, female   DOB: May 04, 1928, 82 y.o.   MRN: [id number] Patient ID: Donna Owens, female   DOB: 12/17/1928, 82 y.o.   MRN: [id number]    Cardiology Office Note    Date:  07/29/2016   ID:  Donna Owens, DOB 1928-01-15, MRN [id number]  PCP:  [name], MD  Cardiologist:   Sanda Klein, MD   Chief Complaint  Patient presents with  . Follow-up    3 months  pt c/o random sharp chest pain; hands broken out--blistering; swelling in legs/feet/ankles; sores on both feet--using cream for it, was told it was a bacterial infection; headaches when she gets up in the morning    History of Present Illness:  Donna Owens is a 82 y.o. female with long term persistent atrial fibrillation with slow ventricular response, diastolic heart failure, HTN, DM, hyperlipidemia, severe neuropathy with unsteady gait and frequent falls.   She has a recurrent rash involving the instep and the dorsolateral surface of both feet. The skin is desquamating, but there is little erythema. She is using a topical triamcinolone cream with some relief. She has a desquamating rash on the palms of both hands as well. Neither location is pruritic.  She had one more fall at home in her kitchen, when she was trying to cook. She simply fell backwards onto her backside. There was no associated dizziness or loss of consciousness. Fortunately, none of her falls have been associated with serious injury or intracranial hemorrhage. Her weight loss has stabilized over the last few months. Her BMI is just over 21. Mini-Mental status performed in January showed findings compatible with mild dementia (score 24).  She has an appointment scheduled with Dr. Sherren Mocha early tomorrow at 1 PM. Not clear to me whether this was prompted by an abnormal carotid study (her daughter seems to believe this, but her most recent carotid duplex that I have a record of was performed 2014), lower extremity arterial study (she has severe  disease in the tibial arteries) or venous Dopplers.  The dose of beta blocker has been progressively reduced due to slow ventricular response.  Past Medical History:  Diagnosis Date  . Anemia   . Atrophic vaginitis   . Blood transfusion without reported diagnosis   . Cancer ([place])   . Carotid arterial disease ([place]) 12/13/11    mild by doppler  . CVA (cerebral vascular accident) ([place])   . Diabetes mellitus   . Hyperlipemia   . Hypertension   . OSA (obstructive sleep apnea)   . OSA on CPAP 06/20/2014  . Osteoporosis   . Pulmonary hypertension ([place])    moderate echo 11/07/10  . PVD (peripheral vascular disease) ([place])   . Sleep apnea     Past Surgical History:  Procedure Laterality Date  . BREAST LUMPECTOMY Right    chemo/radiation  . BREAST LUMPECTOMY Left    benign  . BREAST SURGERY     right 07/26/2000  . CARDIOVERSION N/A 07/21/2014   Procedure: CARDIOVERSION;  Surgeon: Sanda Klein, MD;  Location: [facility];  Service: Cardiovascular;  Laterality: N/A;  . COLONOSCOPY  2010  . ingrown toenails     removed in 03/2013    Outpatient Medications Prior to Visit  Medication Sig Dispense Refill  . acetaminophen (TYLENOL) 500 MG tablet Take 500 mg by mouth every 6 (six) hours as needed for moderate pain.    Marland Kitchen amLODipine (NORVASC) 5 MG tablet Take 1 tablet (5 mg total) by mouth  daily. 30 tablet 6  . calcium-vitamin D (OSCAL WITH D) 500-200 MG-UNIT per tablet Take 1 tablet by mouth daily with breakfast.     . carvedilol (COREG) 3.125 MG tablet Take 1 tablet (3.125 mg total) by mouth 2 (two) times daily with a meal. 180 tablet 1  . Cholecalciferol (VITAMIN D-3) 1000 UNITS CAPS Take 1 capsule by mouth daily.    . Coenzyme Q10 (CO Q 10) 100 MG CAPS Take 1 capsule by mouth daily.    . diazepam (VALIUM) 5 MG tablet Take 1 tablet (5 mg total) by mouth every 8 (eight) hours as needed (dizziness). 5 tablet 0  . ELIQUIS 2.5 MG TABS tablet TAKE 1 TABLET BY MOUTH TWICE DAILY (Patient taking  differently: TAKE 2.5 mg BY MOUTH TWICE DAILY) 180 tablet 1  . ferrous sulfate 325 (65 FE) MG tablet Take 1 tablet by mouth daily.  12  . furosemide (LASIX) 20 MG tablet Take 1 tablet (20 mg total) by mouth 5 days a week. 90 tablet 3  . gabapentin (NEURONTIN) 300 MG capsule Take 1 capsule (300 mg total) by mouth at bedtime. 90 capsule 11  . hydrOXYzine (ATARAX/VISTARIL) 25 MG tablet Take 1 tablet (25 mg total) by mouth 3 (three) times daily as needed for itching. 30 tablet 2  . lisinopril (PRINIVIL,ZESTRIL) 20 MG tablet TAKE 2 TABLETS BY MOUTH DAILY (Patient taking differently: TAKE 20mg   BY MOUTH twice DAILY) 180 tablet 3  . metFORMIN (GLUCOPHAGE) 500 MG tablet Take 1 tablet by mouth daily.  3  . Polyethyl Glycol-Propyl Glycol (SYSTANE OP) Place 1 drop into both eyes 2 (two) times daily. SYSTANE    . pravastatin (PRAVACHOL) 40 MG tablet Take 40 mg by mouth daily.    . promethazine (PHENERGAN) 25 MG tablet Take 25 mg by mouth daily. nausea    . traMADol (ULTRAM) 50 MG tablet Take 1 tablet (50 mg total) by mouth every 6 (six) hours as needed. 15 tablet 0  . triamcinolone cream (KENALOG) 0.1 % Apply 1 application topically 2 (two) times daily. (Patient taking differently: Apply 1 application topically 2 (two) times daily as needed (rash/sore). On left foot.) 30 g 0  . vitamin B-12 (CYANOCOBALAMIN) 1000 MCG tablet Take 1,000 mcg by mouth 2 (two) times daily.     Marland Kitchen amoxicillin (AMOXIL) 500 MG capsule Take 500-1,000 mg by mouth as directed. Reported on 06/19/2016  0   No facility-administered medications prior to visit.      Allergies:   Review of patient's allergies indicates no known allergies.   Social History   Social History  . Marital status: Divorced    Spouse name: N/A  . Number of children: 1  . Years of education: BSN   Occupational History  . RETIRED Retired   Social History Main Topics  . Smoking status: Never Smoker  . Smokeless tobacco: Never Used  . Alcohol use No  . Drug  use: No  . Sexual activity: No   Other Topics Concern  . None   Social History Narrative   Patient is divorced and lives alone.   Patient has one adult daughter.   Patient is retired.   Patient has a BSN degree in nursing.   Patient is right-handed.   Patient drinks one soda per week.     Family History:  The patient's family history includes Diabetes in her brother and other; Heart failure (age of onset: 65) in her daughter; Hypertension in her brother.   ROS:  Please see the history of present illness.    Review of Systems  Constitution: Negative for chills, fever, malaise/fatigue and weight loss.  HENT: Negative for congestion, headaches and nosebleeds.   Eyes: Negative for blurred vision, vision loss in left eye and vision loss in right eye.  Cardiovascular: Positive for leg swelling. Negative for chest pain, claudication, orthopnea, paroxysmal nocturnal dyspnea and syncope.  Respiratory: Negative for cough, shortness of breath and wheezing.   Endocrine: Negative for cold intolerance, polydipsia and polyuria.  Hematologic/Lymphatic: Does not bruise/bleed easily.  Skin: Negative for poor wound healing and rash.  Musculoskeletal: Positive for arthritis and joint pain.  Gastrointestinal: Negative for abdominal pain, change in bowel habit, heartburn, hematemesis and melena.  Genitourinary: Negative for bladder incontinence and non-menstrual bleeding.  Neurological: Positive for light-headedness, loss of balance, numbness and paresthesias. Negative for focal weakness and vertigo.  Psychiatric/Behavioral: Negative for altered mental status, depression and memory loss.   All other systems reviewed and are negative.   PHYSICAL EXAM:   VS:  BP (!) 142/66 (BP Location: Left Arm, Patient Position: Sitting, Cuff Size: Normal)   Pulse (!) 56   Ht 5\' 5"  (1.651 m)   Wt 128 lb 12.8 oz (58.4 kg)   BMI 21.43 kg/m    GEN: Well nourished, well developed, in no acute distress  HEENT:  normal  Neck: no JVD, carotid bruits, or masses Cardiac: irregular, no murmurs, rubs, or gallops,no edema , diminished pedal pulses bilaterally Respiratory:  clear to auscultation bilaterally, normal work of breathing GI: soft, nontender, nondistended, + BS MS: no deformity or atrophy  Skin: warm and dry, no rash Neuro:  Alert and Oriented x 3, Strength and sensation are intact Psych: euthymic mood, full affect  Wt Readings from Last 3 Encounters:  07/29/16 128 lb 12.8 oz (58.4 kg)  04/24/16 130 lb 2 oz (59 kg)  01/23/16 127 lb (57.6 kg)      Studies/Labs Reviewed:   EKG:  EKG is not ordered today.   Recent Labs: 06/19/2016: BUN 26; Creatinine, Ser 1.35; Hemoglobin 11.6; Platelets 148; Potassium 4.2; Sodium 138   Lipid Panel    Component Value Date/Time   CHOL 139 02/02/2014 1106   TRIG 47 02/02/2014 1106   HDL 54 02/02/2014 1106   CHOLHDL 2.6 02/02/2014 1106   VLDL 9 02/02/2014 1106   LDLCALC 76 02/02/2014 1106     ASSESSMENT:    1. Atrial fibrillation with slow ventricular response ([place])   2. Chronic diastolic heart failure ([place])   3. Essential hypertension   4. Dyslipidemia   5. PAD (peripheral artery disease) ([place])   6. Type 2 diabetes mellitus with diabetic peripheral angiopathy without gangrene, without long-term current use of insulin ([place])   7. Frequent falls   8. Amnestic MCI (mild cognitive impairment with memory loss)      PLAN:  In order of problems listed above:  1. Afib: CHADSVasc score 9 is very high , but she has had multiple falls and continues to have unsteady gait. I have told her about my concerns for intracranial hemorrhage and suggested that we stop the anticoagulation. She is terrified of the possibility of a stroke and wants to continue eliquis. Avoid ASA  And NSAIDs. If she has continued problems with falls, may have to decide to stop anticoagulation permanently. May have to refer for Watchman left atrial appendage closure device, but her  age and apparent cognitive decline may make her less suited for invasive intervention. She has excellent  rate control on a very low dose of beta blocker 2. CHF: Clinically euvolemic, NYHA functional class I-II.Limited more by her balance problems then any symptoms of heart failure. Continue furosemide to 5 days a week. She did not have renal artery stenosis by ultrasound in 2015. 3. HTN: in target range. Avoid "perfect" blood pressure control since she appears to have symptoms and signs of orthostatic hypotension and falls.  4. HLP: Satisfactory lipid profile 5. PAD: She has mostly infrapopliteal disease (lateral two-vessel runoff, ABI 0.97 on right 0.90 on left, 2017). TBI is 0.5 bilaterally. I don't think her descuamating rash is ischemic. Advised to see a dermatologist. She may have contact dermatitis.  6. DM: Reports excellent control, on a single metformin tablet daily 7. Falls:  are not related to changes in position, not accompanied dizziness or loss of consciousness and are not suggestive of orthostatic hypotension. Might have a neurological cause. Consider balance physical therapy. He has an upcoming neurology appointment. 8. Note that a Mini-Mental status exam performed on January 31 her neurologist showed a score of 24, indicative of mild dementia. She has burned a couple of meals on her stove because she was "distracted by the telephone". I wonder about her ability to comply with a complex regimen of medications.     Medication Adjustments/Labs and Tests Ordered: Current medicines are reviewed at length with the patient today.  Concerns regarding medicines are outlined above.  Medication changes, Labs and Tests ordered today are listed in the Patient Instructions below. Patient Instructions  Dr Sallyanne Kuster recommends that you schedule a follow-up appointment in 6 months. You will receive a reminder letter in the mail two months in advance. If you don't receive a letter, please call our office  to schedule the follow-up appointment.  If you need a refill on your cardiac medications before your next appointment, please call your pharmacy.    Signed, Sanda Klein, MD  07/29/2016 2:47 PM    [address] [employer], [place], [place]  [address] Phone: [phone]; Fax: [phone]

## 2016-07-29 NOTE — Patient Instructions (Signed)
Dr Croitoru recommends that you schedule a follow-up appointment in 6 months. You will receive a reminder letter in the mail two months in advance. If you don't receive a letter, please call our office to schedule the follow-up appointment.  If you need a refill on your cardiac medications before your next appointment, please call your pharmacy. 

## 2016-07-30 ENCOUNTER — Encounter: Payer: Self-pay | Admitting: Vascular Surgery

## 2016-07-30 ENCOUNTER — Ambulatory Visit (INDEPENDENT_AMBULATORY_CARE_PROVIDER_SITE_OTHER): Payer: Medicare Other | Admitting: Vascular Surgery

## 2016-07-30 VITALS — BP 136/70 | HR 64 | Temp 97.2°F | Resp 16 | Ht 65.0 in | Wt 132.0 lb

## 2016-07-30 DIAGNOSIS — R21 Rash and other nonspecific skin eruption: Secondary | ICD-10-CM | POA: Diagnosis not present

## 2016-07-30 DIAGNOSIS — I6503 Occlusion and stenosis of bilateral vertebral arteries: Secondary | ICD-10-CM

## 2016-07-30 DIAGNOSIS — I6521 Occlusion and stenosis of right carotid artery: Secondary | ICD-10-CM

## 2016-07-30 DIAGNOSIS — F5104 Psychophysiologic insomnia: Secondary | ICD-10-CM | POA: Diagnosis not present

## 2016-07-30 DIAGNOSIS — E46 Unspecified protein-calorie malnutrition: Secondary | ICD-10-CM | POA: Diagnosis not present

## 2016-07-30 DIAGNOSIS — I10 Essential (primary) hypertension: Secondary | ICD-10-CM | POA: Diagnosis not present

## 2016-07-30 DIAGNOSIS — Z6820 Body mass index (BMI) 20.0-20.9, adult: Secondary | ICD-10-CM | POA: Diagnosis not present

## 2016-07-30 DIAGNOSIS — N183 Chronic kidney disease, stage 3 (moderate): Secondary | ICD-10-CM | POA: Diagnosis not present

## 2016-07-30 DIAGNOSIS — R2689 Other abnormalities of gait and mobility: Secondary | ICD-10-CM | POA: Diagnosis not present

## 2016-07-30 NOTE — Progress Notes (Signed)
[facility]  Patient name: Donna Owens MRN: [id number] DOB: 09-Jan-1928 Sex: female  REASON FOR VISIT: right carotid stenosis, referred by Dr. Sharlett Iles  HPI: Donna Owens is a 82 y.o. female, who who presents for evaluation of right internal carotid artery stenosis. The patient recently went to the [facility] emergency department on 06/19/2016 with complaints of headache for the past month. Her workup included a CT angiogram that suggested a 50-60% right internal carotid artery stenosis. She has had a previous carotid duplex back in 2014 revealing less than 50% stenosis bilaterally. The patient also reports concomitant nasal congestion while she had her headaches. Her headaches have now resolved. The patient is accompanied by her daughter who reports the patient had 3 ministrokes last winter. The patient and her daughter do not remember any of the symptoms. I could not locate this in the electronic medical record. The patient denies a history of amaurosis fugax, sudden onset weakness or numbness in the extremities, expressive or receptive aphasia.  The patient has history of atrial fibrillation on Eliquis. She does note some dizziness, unsteady gait and frequent falls. She has a history of congestive heart failure. She does report intermittent chest discomfort. She is being followed by cardiologist Dr. Sallyanne Kuster whom she last saw yesterday. In Dr. Victorino December note, it mentions that the patient is terrified of the possibility stroke and wanted to continue Eliquis despite multiple falls and unsteady gait. Her past medical history includes hypertension, hyperlipidemia and diabetes, all of which are stable. She has never been a smoker.  The patient lives alone and remains active.  Past Medical History:  Diagnosis Date  . Anemia   . Atrophic vaginitis   . Blood transfusion without reported diagnosis   . Cancer ([place])   . Carotid arterial disease ([place]) 12/13/11   mild by doppler  . CVA (cerebral vascular accident) ([place])   . Diabetes mellitus   . Hyperlipemia   . Hypertension   . OSA (obstructive sleep apnea)   . OSA on CPAP 06/20/2014  . Osteoporosis   . Pulmonary hypertension ([place])    moderate echo 11/07/10  . PVD (peripheral vascular disease) ([place])   . Sleep apnea     Family History  Problem Relation Age of Onset  . Diabetes Other   . Diabetes Brother   . Hypertension Brother   . Heart failure Daughter 71    SOCIAL HISTORY: Social History   Social History  . Marital status: Divorced    Spouse name: N/A  . Number of children: 1  . Years of education: BSN   Occupational History  . RETIRED Retired   Social History Main Topics  . Smoking status: Never Smoker  . Smokeless tobacco: Never Used  . Alcohol use No  . Drug use: No  . Sexual activity: No   Other Topics Concern  . Not on file   Social History Narrative   Patient is divorced and lives alone.   Patient has one adult daughter.   Patient is retired.   Patient has a BSN degree in nursing.   Patient is right-handed.   Patient drinks one soda per week.    No Known Allergies  Current Outpatient Prescriptions  Medication Sig Dispense Refill  . acetaminophen (TYLENOL) 500 MG tablet Take 500 mg by mouth every 6 (six) hours as needed for moderate pain.    Marland Kitchen amLODipine (NORVASC) 5 MG tablet Take 1 tablet (5 mg total) by mouth daily. [place]  tablet 6  . calcium-vitamin D (OSCAL WITH D) 500-200 MG-UNIT per tablet Take 1 tablet by mouth daily with breakfast.     . carvedilol (COREG) 3.125 MG tablet Take 1 tablet (3.125 mg total) by mouth 2 (two) times daily with a meal. 180 tablet 1  . Cholecalciferol (VITAMIN D-3) 1000 UNITS CAPS Take 1 capsule by mouth daily.    . Coenzyme Q10 (CO Q 10) 100 MG CAPS Take 1 capsule by mouth daily.    . diazepam (VALIUM) 5 MG tablet Take 1 tablet (5 mg total) by mouth every 8 (eight) hours as needed (dizziness). 5 tablet 0  . ELIQUIS 2.5 MG TABS  tablet TAKE 1 TABLET BY MOUTH TWICE DAILY (Patient taking differently: TAKE 2.5 mg BY MOUTH TWICE DAILY) 180 tablet 1  . ferrous sulfate 325 (65 FE) MG tablet Take 1 tablet by mouth daily.  12  . furosemide (LASIX) 20 MG tablet Take 1 tablet (20 mg total) by mouth 5 days a week. 90 tablet 3  . gabapentin (NEURONTIN) 300 MG capsule Take 1 capsule (300 mg total) by mouth at bedtime. 90 capsule 11  . hydrOXYzine (ATARAX/VISTARIL) 25 MG tablet Take 1 tablet (25 mg total) by mouth 3 (three) times daily as needed for itching. 30 tablet 2  . lisinopril (PRINIVIL,ZESTRIL) 20 MG tablet TAKE 2 TABLETS BY MOUTH DAILY (Patient taking differently: TAKE 20mg   BY MOUTH twice DAILY) 180 tablet 3  . metFORMIN (GLUCOPHAGE) 500 MG tablet Take 1 tablet by mouth daily.  3  . Polyethyl Glycol-Propyl Glycol (SYSTANE OP) Place 1 drop into both eyes 2 (two) times daily. SYSTANE    . pravastatin (PRAVACHOL) 40 MG tablet Take 40 mg by mouth daily.    . promethazine (PHENERGAN) 25 MG tablet Take 25 mg by mouth daily. nausea    . traMADol (ULTRAM) 50 MG tablet Take 1 tablet (50 mg total) by mouth every 6 (six) hours as needed. 15 tablet 0  . triamcinolone cream (KENALOG) 0.1 % Apply 1 application topically 2 (two) times daily. (Patient taking differently: Apply 1 application topically 2 (two) times daily as needed (rash/sore). On left foot.) 30 g 0  . vitamin B-12 (CYANOCOBALAMIN) 1000 MCG tablet Take 1,000 mcg by mouth 2 (two) times daily.      No current facility-administered medications for this visit.     REVIEW OF SYSTEMS:  [X]  denotes positive finding, [ ]  denotes negative finding Cardiac  Comments:  Chest pain or chest pressure:    Shortness of breath upon exertion: x   Short of breath when lying flat:    Irregular heart rhythm: x       Vascular    Pain in calf, thigh, or hip brought on by ambulation:    Pain in feet at night that wakes you up from your sleep:     Blood clot in your veins:    Leg swelling:          Pulmonary    Oxygen at home:    Productive cough:     Wheezing:         Neurologic    Sudden weakness in arms or legs:     Sudden numbness in arms or legs:     Sudden onset of difficulty speaking or slurred speech:    Temporary loss of vision in one eye:     Problems with dizziness:         Gastrointestinal    Blood in stool:  Vomited blood:         Genitourinary    Burning when urinating:     Blood in urine:        Psychiatric    Major depression:         Hematologic    Bleeding problems:    Problems with blood clotting too easily:        Skin    Rashes or ulcers: x       Constitutional    Fever or chills:      PHYSICAL EXAM: Vitals:   07/30/16 1300 07/30/16 1304  BP: 140/60 136/70  Pulse: 64   Resp: 16   Temp: 97.2 F (36.2 C)   TempSrc: Oral   SpO2: 100%   Weight: 132 lb (59.9 kg)   Height: 5\' 5"  (1.651 m)     GENERAL: The patient is a well-nourished female, in no acute distress. The vital signs are documented above. CARDIAC: Irregularly irregular, no carotid bruits. VASCULAR: Radial pulses palpable, asymmetric.  PULMONARY: There is good air exchange bilaterally without wheezing or rales. MUSCULOSKELETAL: There are no major deformities or cyanosis. NEUROLOGIC: 5/5 strength upper and lower extremities bilaterally. No focal deficits appreciated. PSYCHIATRIC: The patient has a normal affect.  DATA:  CTA Head/Neck 06/19/16  IMPRESSION: 1. Stable moderate atrophy and diffuse white matter disease. 2. Moderate stenosis of the proximal left subclavian artery. 3. Moderate to high-grade stenoses of the origins of both vertebral arteries. 4. Fetal type posterior cerebral arteries bilaterally. 5. Dense atherosclerotic calcifications of the distal common carotid arteries and carotid bifurcations bilaterally. 6. 50 - 60% stenosis of the proximal right internal carotid artery. 7. Atherosclerotic calcifications within the cavernous internal carotid  arteries bilaterally without definite stenoses. 8. Moderate stenoses of the distal M1 segments bilaterally. 9. Moderate stenosis of the proximal right posterior cerebral artery. 10. Advanced spondylosis of the cervical spine.  MEDICAL ISSUES: Asymptomatic right internal carotid artery stenosis 50-60%  The patient has not had any signs or symptoms of a TIA or stroke. Discussed that her dizziness is unrelated to her carotid stenosis. She is on Eliqus for atrial fibrillation. She is also on a statin for hyperlipidemia. Plan for repeat carotid duplex in 6 months. The patient knows to report to the emergency department if she develops any signs or symptoms of a TIA or stroke.   Virgina Jock, PA-C [facility]     I have examined the patient, reviewed and agree with above. Asymptomatic moderate to severe carotid stenosis on the right. We will continue her usual activities and will notify should she develop any neurologic deficits. Will be seen in 6 months for repeat carotid duplex  Curt Jews, MD 07/30/2016 2:25 PM

## 2016-07-30 NOTE — Addendum Note (Signed)
Addended by: Larey Seat on: 07/30/2016 05:01 PM   Modules accepted: Orders

## 2016-07-31 IMAGING — CT CT ANGIO HEAD
1 of 10 series · 5 of 33 positions shown · IV contrast (ISOVUE 370)
Comparison: CT head without contrast 12/17/2015. MRI brain
01/25/2015.

CLINICAL DATA: Headache and neck pains for several days.

EXAM:
CT ANGIOGRAPHY HEAD AND NECK
TECHNIQUE: Multidetector CT imaging of the head and neck was performed using
the standard protocol during bolus administration of intravenous
contrast. Multiplanar CT image reconstructions and MIPs were
obtained to evaluate the vascular anatomy. Carotid stenosis
measurements (when applicable) are obtained utilizing NASCET
criteria, using the distal internal carotid diameter as the
denominator.
CONTRAST:  100 mL Isovue 370

[Series 6: axial thin · axial · 0.45mm/px · z∈[-251,-26]mm · 5 of 337 slices shown]
[im 57/337  soft-tissue]
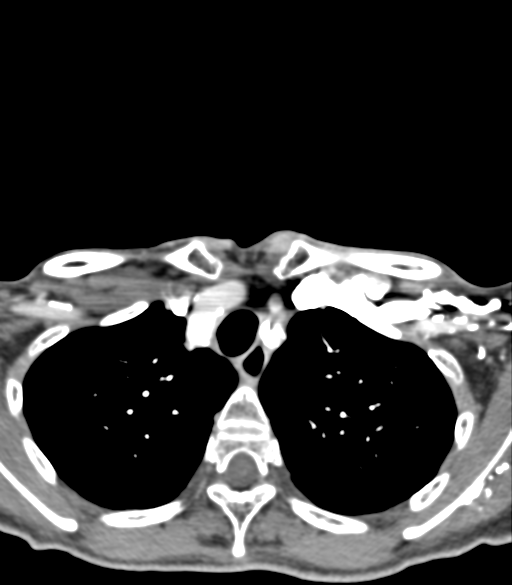
[im 113/337  bone]
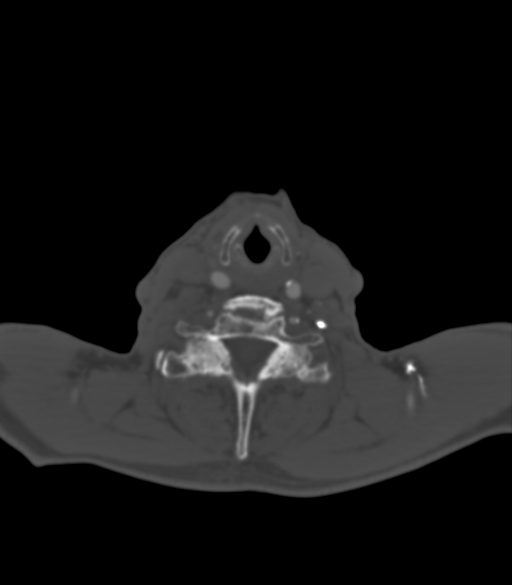
[im 169/337  soft-tissue]
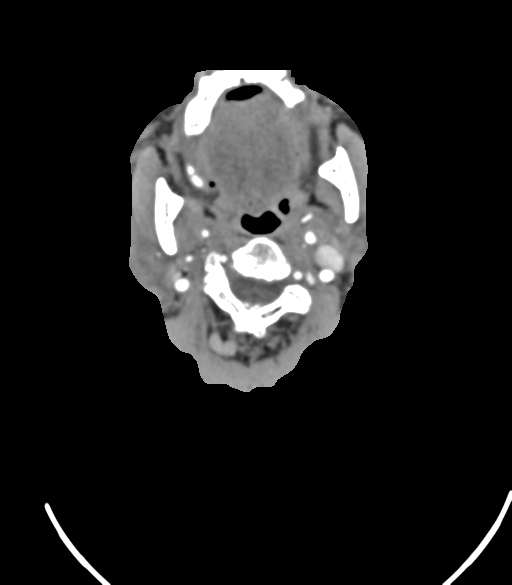
[im 225/337  bone]
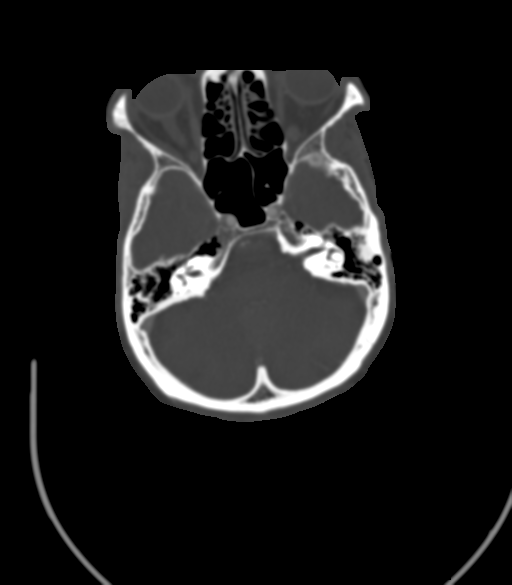
[im 281/337  soft-tissue]
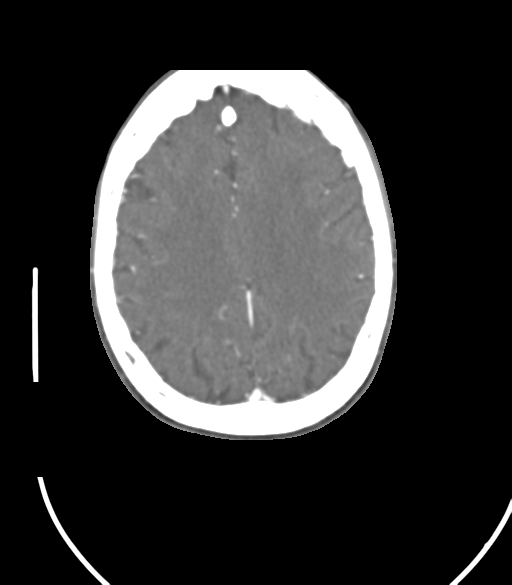

[5 of 33 positions shown; findings below may reference images not displayed]

FINDINGS: CT HEAD

Brain: Moderate atrophy and diffuse white matter disease is similar
to the prior study. There are remote lacunar infarcts of the basal
ganglia bilaterally without significant interval change. No acute
hemorrhage or mass lesion is present. There is no significant
extra-axial fluid collection.

Calvarium and skull base: Calvarium is intact.

Paranasal sinuses: The paranasal sinuses and mastoid air cells are
clear.

Orbits: The globes and orbits are intact.

CTA NECK

Aortic arch: A 3 vessel arch configuration is present. There dense
calcifications at the origin of the left subclavian artery. The
lumen is narrowed to 1.5 mm minimal diameter compared with 7 mm more
distally. There is no significant atherosclerotic calcification at
the left common carotid artery or innominate artery. Dense
calcifications are present in the more distal aortic arch without
aneurysm or dissection.

Right carotid system: The right common carotid artery is mildly
tortuous without significant stenosis. Dense atherosclerotic
calcifications are present. The lumen is narrowed to 2.3 mm. This
compares with a more distal measurement of 5.3 mm. There are no
other significant stenoses in the cervical right ICA.

Left carotid system: The left common carotid artery is mildly
tortuous without significant stenosis. Distal calcifications an
atherosclerotic change are noted. There are calcifications at the
carotid bifurcation without a significant stenosis. The cervical
left ICA is otherwise normal.

Vertebral arteries:Both vertebral arteries originate from the
subclavian arteries. There is a dense calcification at the origin of
the non dominant right vertebral artery. Moderate stenosis is
present at the origin of the left vertebral artery. There is
moderate tortuosity of the cervical vertebral arteries bilaterally
without focal stenosis.

Skeleton: Advanced degenerative changes are present throughout the
cervical spine. Advanced facet hypertrophy is present bilaterally
C2-3 C7. Slight anterolisthesis is present at C4-5 and C5-6. There
is chronic loss of disc height at C5-6 and C6-7. No focal lytic or
blastic lesions are present.

Other neck: No focal mucosal or submucosal lesions are present.
Salivary glands are within normal limits bilaterally. The thyroid is
heterogeneous without a dominant lesion. No significant adenopathy
is present. The lung apices are clear.

CTA HEAD

Anterior circulation: Atherosclerotic calcifications are present
within the cavernous internal carotid arteries bilaterally. There is
no definite stenosis. The ICA termini are within normal limits
bilaterally. The left A1 segment is dominant. The anterior
communicating artery is patent. There is moderate narrowing in the
distal M1 segments bilaterally, more distally on the right than
left. The MCA bifurcations are otherwise intact. Proximally ACA and
MCA branch vessels are within normal limits bilaterally.

Posterior circulation: Moderate narrowing is present at the non
dominant right vertebral artery at dural margin the calcification.
PICA origins are visualized and normal bilaterally. The basilar
artery is small. Both posterior cerebral arteries are of fetal type
with small P1 segments bilaterally. There is moderate proximal
narrowing of the right PCA. Distal PCA branch vessels are intact
bilaterally.

Venous sinuses: The dural sinuses are patent. The transverse sinuses
are codominant. The straight sinus and deep cerebral veins are
intact. The cortical veins are unremarkable.

Anatomic variants: Bilateral fetal type posterior cerebral arteries.

Delayed phase: Postcontrast images demonstrate no pathologic
enhancement.
IMPRESSION: 1. Stable moderate atrophy and diffuse white matter disease.
2. Moderate stenosis of the proximal left subclavian artery.
3. Moderate to high-grade stenoses of the origins of both vertebral
arteries.
4. Fetal type posterior cerebral arteries bilaterally.
5. Dense atherosclerotic calcifications of the distal common carotid
arteries and carotid bifurcations bilaterally.
6. 50 - 60% stenosis of the proximal right internal carotid artery.
7. Atherosclerotic calcifications within the cavernous internal
carotid arteries bilaterally without definite stenoses.
8. Moderate stenoses of the distal M1 segments bilaterally.
9. Moderate stenosis of the proximal right posterior cerebral
artery.
10. Advanced spondylosis of the cervical spine.

## 2016-08-06 ENCOUNTER — Encounter: Payer: Self-pay | Admitting: Podiatry

## 2016-08-06 ENCOUNTER — Ambulatory Visit (INDEPENDENT_AMBULATORY_CARE_PROVIDER_SITE_OTHER): Payer: Medicare Other | Admitting: Podiatry

## 2016-08-06 DIAGNOSIS — B353 Tinea pedis: Secondary | ICD-10-CM

## 2016-08-06 DIAGNOSIS — M79673 Pain in unspecified foot: Secondary | ICD-10-CM

## 2016-08-06 DIAGNOSIS — B351 Tinea unguium: Secondary | ICD-10-CM

## 2016-08-06 MED ORDER — ECONAZOLE NITRATE 1 % EX CREA: TOPICAL_CREAM | Freq: Every day | CUTANEOUS | 0 refills | Status: DC

## 2016-08-06 NOTE — Progress Notes (Signed)
Patient ID: Donna Owens, female   DOB: 11/24/1928, 82 y.o.   MRN: [id number] Complaint:  Visit Type: Patient returns to my office for continued preventative foot care services. Complaint: Patient states" my nails have grown long and thick and become painful to walk and wear shoes" Patient has been diagnosed with DM .Marland Kitchen The patient presents for preventative foot care services. No changes to ROS.  Blister noted at base 4,5 toes left foot. Healing blister formation on left foot.  Treated by Hibiclens by [facility].  Podiatric Exam: Vascular: dorsalis pedis and posterior tibial pulses are palpable bilateral. Capillary return is immediate. Temperature gradient is WNL. Skin turgor WNL  Sensorium: Normal Semmes Weinstein monofilament test. Normal tactile sensation bilaterally. Nail Exam: Pt has thick disfigured discolored nails with subungual debris noted bilateral entire nail hallux through fifth toenails.  She has pain medial border left hallux in the absence of infection. Ulcer Exam: There is no evidence of ulcer or pre-ulcerative changes or infection. Orthopedic Exam: Muscle tone and strength are WNL. No limitations in general ROM. No crepitus or effusions noted. Foot type and digits show no abnormalities. Bony prominences are unremarkable at the fifth metabase B/L.    Skin: No Porokeratosis. No infection or ulcers.  Blister dorsum left foot base 4,5 left foot.Healing blister formation on dorsal lateral aspect and medial plantat aspect left foot.  Diagnosis:  Onychomycosis, , Pain in right toe, pain in left toes  Treatment & Plan Procedures and Treatment: Consent by patient was obtained for treatment procedures. The patient understood the discussion of treatment and procedures well. All questions were answered thoroughly reviewed. Debridement of mycotic and hypertrophic toenails, 1 through 5 bilateral and clearing of subungual debris. No ulceration, no infection noted. Prescribed econazole. Return  Visit-Office Procedure: Patient instructed to return to the office for a follow up visit 3 months for continued evaluation and treatment.   Gardiner Barefoot DPM

## 2016-08-06 NOTE — Addendum Note (Signed)
Addended by: Ezzard Flax, Kenyon Eichelberger L on: 08/06/2016 05:13 PM   Modules accepted: Orders

## 2016-08-07 ENCOUNTER — Ambulatory Visit (INDEPENDENT_AMBULATORY_CARE_PROVIDER_SITE_OTHER): Payer: Self-pay | Admitting: Ophthalmology

## 2016-08-09 ENCOUNTER — Telehealth: Payer: Self-pay | Admitting: Cardiovascular Disease

## 2016-08-09 MED ORDER — APIXABAN 2.5 MG PO TABS: 2.5000 mg | ORAL_TABLET | Freq: Two times a day (BID) | ORAL | 11 refills | Status: DC

## 2016-08-09 NOTE — Telephone Encounter (Signed)
Spoke to pt daughter who believes she is in the doughnut hole. She also states she believes the patient has spent a signifcant amount of her income on medications.   She will bring a copy of income and amount spent on medications for the year to our office on Tuesday to fill out patient assistance paperwork.  If we are unable to get her covered through pt assistance pt is comfortable going on warfarin.

## 2016-08-09 NOTE — Telephone Encounter (Signed)
I suspect she hit the doughnut hole. Can we help?

## 2016-08-09 NOTE — Telephone Encounter (Signed)
New message       Pt cannot afford eliquis.  Can get get a presc for something else?

## 2016-08-09 NOTE — Telephone Encounter (Signed)
Returned call to patient's daughter [name].She stated eliquis too expensive.Advised only other option would be coumadin.Stated she is willing to change over.Advised I will send message to Dr.Croitoru for a order.

## 2016-08-12 ENCOUNTER — Ambulatory Visit (INDEPENDENT_AMBULATORY_CARE_PROVIDER_SITE_OTHER): Payer: Medicare Other | Admitting: Adult Health

## 2016-08-12 ENCOUNTER — Encounter: Payer: Self-pay | Admitting: Adult Health

## 2016-08-12 VITALS — BP 130/58 | HR 52 | Wt 133.6 lb

## 2016-08-12 DIAGNOSIS — R413 Other amnesia: Secondary | ICD-10-CM

## 2016-08-12 DIAGNOSIS — I6503 Occlusion and stenosis of bilateral vertebral arteries: Secondary | ICD-10-CM

## 2016-08-12 NOTE — Patient Instructions (Signed)
Consider Aricept Memory score slightly decreased since last visit Consider participating in [facility]. Donepezil tablets What is this medicine? DONEPEZIL (doe NEP e zil) is used to treat mild to moderate dementia caused by Alzheimer's disease. This medicine may be used for other purposes; ask your health care provider or pharmacist if you have questions. What should I tell my health care provider before I take this medicine? They need to know if you have any of these conditions: -asthma or other lung disease -difficulty passing urine -head injury -heart disease -history of irregular heartbeat -liver disease -seizures (convulsions) -stomach or intestinal disease, ulcers or stomach bleeding -an unusual or allergic reaction to donepezil, other medicines, foods, dyes, or preservatives -pregnant or trying to get pregnant -breast-feeding How should I use this medicine? Take this medicine by mouth with a glass of water. Follow the directions on the prescription label. You may take this medicine with or without food. Take this medicine at regular intervals. This medicine is usually taken before bedtime. Do not take it more often than directed. Continue to take your medicine even if you feel better. Do not stop taking except on your doctor's advice. If you are taking the 23 mg donepezil tablet, swallow it whole; do not cut, crush, or chew it. Talk to your pediatrician regarding the use of this medicine in children. Special care may be needed. Overdosage: If you think you have taken too much of this medicine contact a poison control center or emergency room at once. NOTE: This medicine is only for you. Do not share this medicine with others. What if I miss a dose? If you miss a dose, take it as soon as you can. If it is almost time for your next dose, take only that dose, do not take double or extra doses. What may interact with this medicine? Do not take this medicine with any of the  following medications: -certain medicines for fungal infections like itraconazole, fluconazole, posaconazole, and voriconazole -cisapride -dextromethorphan; quinidine -dofetilide -dronedarone -pimozide -quinidine -thioridazine -ziprasidone This medicine may also interact with the following medications: -antihistamines for allergy, cough and cold -atropine -bethanechol -carbamazepine -certain medicines for bladder problems like oxybutynin, tolterodine -certain medicines for Parkinson's disease like benztropine, trihexyphenidyl -certain medicines for stomach problems like dicyclomine, hyoscyamine -certain medicines for travel sickness like scopolamine -dexamethasone -ipratropium -NSAIDs, medicines for pain and inflammation, like ibuprofen or naproxen -other medicines for Alzheimer's disease -other medicines that prolong the QT interval (cause an abnormal heart rhythm) -phenobarbital -phenytoin -rifampin, rifabutin or rifapentine This list may not describe all possible interactions. Give your health care provider a list of all the medicines, herbs, non-prescription drugs, or dietary supplements you use. Also tell them if you smoke, drink alcohol, or use illegal drugs. Some items may interact with your medicine. What should I watch for while using this medicine? Visit your doctor or health care professional for regular checks on your progress. Check with your doctor or health care professional if your symptoms do not get better or if they get worse. You may get drowsy or dizzy. Do not drive, use machinery, or do anything that needs mental alertness until you know how this drug affects you. What side effects may I notice from receiving this medicine? Side effects that you should report to your doctor or health care professional as soon as possible: -allergic reactions like skin rash, itching or hives, swelling of the face, lips, or tongue -changes in vision -feeling faint or  lightheaded, falls -problems  with balance -redness, blistering, peeling or loosening of the skin, including inside the mouth -slow heartbeat, or palpitations -stomach pain -unusual bleeding or bruising, red or purple spots on the skin -vomiting -weight loss Side effects that usually do not require medical attention (report to your doctor or health care professional if they continue or are bothersome): -diarrhea, especially when starting treatment -headache -indigestion or heartburn -loss of appetite -muscle cramps -nausea This list may not describe all possible side effects. Call your doctor for medical advice about side effects. You may report side effects to FDA at 1-800-FDA-1088. Where should I keep my medicine? Keep out of reach of children. Store at room temperature between 15 and 30 degrees C (59 and 86 degrees F). Throw away any unused medicine after the expiration date. NOTE: This sheet is a summary. It may not cover all possible information. If you have questions about this medicine, talk to your doctor, pharmacist, or health care provider.    2016, Elsevier/Gold Standard. (2014-07-21 07:51:52)

## 2016-08-12 NOTE — Progress Notes (Signed)
PATIENT: Donna Owens DOB: May 18, 1928  REASON FOR VISIT: follow up- mild cognitive impairment HISTORY FROM: patient  HISTORY OF PRESENT ILLNESS: Donna Owens is an 82 year old female with a history of mild cognitive impairment. She returns today for follow-up. The patient is none on any medication for her memory. There has been possible depression noted in the past. The patient's daughter is with her today reports that the patient is able to complete all ADLs with minimal assistance. She does not operate a motor vehicle. She no longer cooks due to burning her food. She reports that she tends to stay up late and nap during the day. She denies any hallucinations. Reports that her mood fluctuates. She does states that she is not very active and does not socialize with others often. She has considered participate in senior citizen. She reports that she does have cardiac history including atrial fibrillation and congestive heart failure. She is not aware of any heart block. She returns today for an evaluation.  HISTORY  Interval history from 01/23/2016 per Dr. Brett Fairy notes: Donna Owens is here today for a revisit. She reports that she has started to eat more take out food after she had a couple of meals burn on the stove. She states that usually the telephone rings and it distracts her but she is cooking leaving her with a mass on the stove. We discussed today that the very high salt content of many processed foods may not be to her benefit and she may consider using a meal service. I think that she would enjoy cooking if she wouldn't have to shop for single portion preparation. Her closest family is her daughter and her daughter's children. She reports that there has been no complained about cognitive decline on memory failures. She also feels not excessively daytime sleepy. We performed today and mini mental status examination at the patient scored 24 points on the animal fluency test today she scored  only 7 points which is lower than last time and she had significant difficulties with copying an image or drawing a clock face. 24-30 is indicative of mild dementia.   REVIEW OF SYSTEMS: Out of a complete 14 system review of symptoms, the patient complains only of the following symptoms, and all other reviewed systems are negative.  Constipation, restless leg, leg swelling, joint pain, back pain, muscle cramps, walking difficulty, neck pain, rash, decreased concentration, weakness, headache, dizziness, frequency of urination, cold intolerance  ALLERGIES: No Known Allergies  HOME MEDICATIONS: Outpatient Medications Prior to Visit  Medication Sig Dispense Refill  . acetaminophen (TYLENOL) 500 MG tablet Take 500 mg by mouth every 6 (six) hours as needed for moderate pain.    Marland Kitchen amLODipine (NORVASC) 5 MG tablet Take 1 tablet (5 mg total) by mouth daily. 30 tablet 6  . apixaban (ELIQUIS) 2.5 MG TABS tablet Take 1 tablet (2.5 mg total) by mouth 2 (two) times daily. 60 tablet 11  . calcium-vitamin D (OSCAL WITH D) 500-200 MG-UNIT per tablet Take 1 tablet by mouth daily with breakfast.     . carvedilol (COREG) 3.125 MG tablet Take 1 tablet (3.125 mg total) by mouth 2 (two) times daily with a meal. 180 tablet 1  . Cholecalciferol (VITAMIN D-3) 1000 UNITS CAPS Take 1 capsule by mouth daily.    . Coenzyme Q10 (CO Q 10) 100 MG CAPS Take 1 capsule by mouth daily.    . diazepam (VALIUM) 5 MG tablet Take 1 tablet (5 mg total) by mouth  every 8 (eight) hours as needed (dizziness). 5 tablet 0  . econazole nitrate 1 % cream Apply topically daily. 15 g 0  . ferrous sulfate 325 (65 FE) MG tablet Take 1 tablet by mouth daily.  12  . furosemide (LASIX) 20 MG tablet Take 1 tablet (20 mg total) by mouth 5 days a week. 90 tablet 3  . gabapentin (NEURONTIN) 300 MG capsule Take 1 capsule (300 mg total) by mouth at bedtime. 90 capsule 11  . hydrOXYzine (ATARAX/VISTARIL) 25 MG tablet Take 1 tablet (25 mg total) by mouth 3  (three) times daily as needed for itching. 30 tablet 2  . lisinopril (PRINIVIL,ZESTRIL) 20 MG tablet TAKE 2 TABLETS BY MOUTH DAILY (Patient taking differently: TAKE 20mg   BY MOUTH twice DAILY) 180 tablet 3  . metFORMIN (GLUCOPHAGE) 500 MG tablet Take 1 tablet by mouth daily.  3  . Polyethyl Glycol-Propyl Glycol (SYSTANE OP) Place 1 drop into both eyes 2 (two) times daily. SYSTANE    . pravastatin (PRAVACHOL) 40 MG tablet Take 40 mg by mouth daily.    . promethazine (PHENERGAN) 25 MG tablet Take 25 mg by mouth daily. nausea    . traMADol (ULTRAM) 50 MG tablet Take 1 tablet (50 mg total) by mouth every 6 (six) hours as needed. 15 tablet 0  . triamcinolone cream (KENALOG) 0.1 % Apply 1 application topically 2 (two) times daily. (Patient taking differently: Apply 1 application topically 2 (two) times daily as needed (rash/sore). On left foot.) 30 g 0  . vitamin B-12 (CYANOCOBALAMIN) 1000 MCG tablet Take 1,000 mcg by mouth 2 (two) times daily.      No facility-administered medications prior to visit.     PAST MEDICAL HISTORY: Past Medical History:  Diagnosis Date  . Anemia   . Atrophic vaginitis   . Blood transfusion without reported diagnosis   . Cancer ([place])   . Carotid arterial disease ([place]) 12/13/11    mild by doppler  . CVA (cerebral vascular accident) ([place])   . Diabetes mellitus   . Hyperlipemia   . Hypertension   . OSA (obstructive sleep apnea)   . OSA on CPAP 06/20/2014  . Osteoporosis   . Pulmonary hypertension ([place])    moderate echo 11/07/10  . PVD (peripheral vascular disease) ([place])   . Sleep apnea     PAST SURGICAL HISTORY: Past Surgical History:  Procedure Laterality Date  . BREAST LUMPECTOMY Right    chemo/radiation  . BREAST LUMPECTOMY Left    benign  . BREAST SURGERY     right 07/26/2000  . CARDIOVERSION N/A 07/21/2014   Procedure: CARDIOVERSION;  Surgeon: Sanda Klein, MD;  Location: [facility];  Service: Cardiovascular;  Laterality: N/A;  . COLONOSCOPY  2010   . ingrown toenails     removed in 03/2013    FAMILY HISTORY: Family History  Problem Relation Age of Onset  . Diabetes Brother   . Hypertension Brother   . Heart failure Daughter 68  . Diabetes Other     SOCIAL HISTORY: Social History   Social History  . Marital status: Divorced    Spouse name: N/A  . Number of children: 1  . Years of education: BSN   Occupational History  . RETIRED Retired   Social History Main Topics  . Smoking status: Never Smoker  . Smokeless tobacco: Never Used  . Alcohol use No  . Drug use: No  . Sexual activity: No   Other Topics Concern  . Not on file  Social History Narrative   Patient is divorced and lives alone.   Patient has one adult daughter.   Patient is retired.   Patient has a BSN degree in nursing.   Patient is right-handed.   Patient drinks one soda per week.      PHYSICAL EXAM  Vitals:   08/12/16 1046  BP: (!) 130/58  Pulse: (!) 52  Weight: 133 lb 9.6 oz (60.6 kg)   Body mass index is 22.23 kg/m.  Generalized: Well developed, in no acute distress   Neurological examination  Mentation: Alert oriented to time, place, history taking. Follows all commands speech and language fluent Cranial nerve II-XII: Pupils were equal round reactive to light. Extraocular movements were full, visual field were full on confrontational test. Facial sensation and strength were normal. Uvula tongue midline. Head turning and shoulder shrug  were normal and symmetric. Motor: The motor testing reveals 5 over 5 strength of all 4 extremities. Good symmetric motor tone is noted throughout.  Sensory: Sensory testing is intact to soft touch on all 4 extremities. No evidence of extinction is noted.  Coordination: Cerebellar testing reveals good finger-nose-finger and heel-to-shin bilaterally.  Gait and station: Gait is normal. Tandem gait is normal. Romberg is negative. No drift is seen.  Reflexes: Deep tendon reflexes are symmetric and normal  bilaterally.   DIAGNOSTIC DATA (LABS, IMAGING, TESTING) - I reviewed patient records, labs, notes, testing and imaging myself where available.  Lab Results  Component Value Date   WBC 5.8 06/19/2016   HGB 11.6 (L) 06/19/2016   HCT 34.6 (L) 06/19/2016   MCV 89.2 06/19/2016   PLT 148 (L) 06/19/2016      Component Value Date/Time   NA 138 06/19/2016 1421   NA 139 06/21/2015 1358   K 4.2 06/19/2016 1421   CL 108 06/19/2016 1421   CO2 22 06/19/2016 1421   GLUCOSE 91 06/19/2016 1421   BUN 26 (H) 06/19/2016 1421   BUN 33 (H) 06/21/2015 1358   CREATININE 1.35 (H) 06/19/2016 1421   CREATININE 1.23 (H) 10/05/2015 1151   CALCIUM 9.4 06/19/2016 1421   PROT 6.9 07/21/2015 1124   PROT 6.7 06/21/2015 1358   ALBUMIN 3.8 07/21/2015 1124   ALBUMIN 4.1 06/21/2015 1358   AST 23 07/21/2015 1124   ALT 17 07/21/2015 1124   ALKPHOS 45 07/21/2015 1124   BILITOT 0.7 07/21/2015 1124   BILITOT 0.3 06/21/2015 1358   GFRNONAA 34 (L) 06/19/2016 1421   GFRNONAA 45 (L) 06/29/2014 1607   GFRAA 40 (L) 06/19/2016 1421   GFRAA 52 (L) 06/29/2014 1607    ASSESSMENT AND PLAN 82 y.o. year old female  has a past medical history of Anemia; Atrophic vaginitis; Blood transfusion without reported diagnosis; Cancer ([place]); Carotid arterial disease ([place]) (12/13/11); CVA (cerebral vascular accident) ([place]); Diabetes mellitus; Hyperlipemia; Hypertension; OSA (obstructive sleep apnea); OSA on CPAP (06/20/2014); Osteoporosis; Pulmonary hypertension ([facility]); PVD (peripheral vascular disease) ([place]); and Sleep apnea. here with:  1. Memory disturbance  Memory score is slightly decreased since the last visit. MMSE is 23/30. Patient is interested in participating in [place] [facility] the triad. I have given the patient information in order to pursue this. This may also help with patient's mood. The patient is interested in considering memory medication. We have discussed Aricept and Namenda. The patient does have a cardiac history and heart  rate today is low. Namenda may be a better option for the patient. She will consult with her cardiologist before starting any new medication.  She will follow up with our office in 3-4 months or sooner if needed.     Ward Givens, MSN, NP-C 08/12/2016, 10:46 AM [facility] [address] [place],  [address] [phone]

## 2016-08-14 NOTE — Addendum Note (Signed)
Addended by: Mena Goes on: 08/14/2016 04:09 PM   Modules accepted: Orders

## 2016-09-11 ENCOUNTER — Ambulatory Visit (INDEPENDENT_AMBULATORY_CARE_PROVIDER_SITE_OTHER): Payer: Medicare Other | Admitting: Ophthalmology

## 2016-09-11 DIAGNOSIS — E113212 Type 2 diabetes mellitus with mild nonproliferative diabetic retinopathy with macular edema, left eye: Secondary | ICD-10-CM

## 2016-09-11 DIAGNOSIS — H35033 Hypertensive retinopathy, bilateral: Secondary | ICD-10-CM | POA: Diagnosis not present

## 2016-09-11 DIAGNOSIS — H43813 Vitreous degeneration, bilateral: Secondary | ICD-10-CM

## 2016-09-11 DIAGNOSIS — E113391 Type 2 diabetes mellitus with moderate nonproliferative diabetic retinopathy without macular edema, right eye: Secondary | ICD-10-CM

## 2016-09-11 DIAGNOSIS — E11311 Type 2 diabetes mellitus with unspecified diabetic retinopathy with macular edema: Secondary | ICD-10-CM | POA: Diagnosis not present

## 2016-09-11 DIAGNOSIS — I10 Essential (primary) hypertension: Secondary | ICD-10-CM | POA: Diagnosis not present

## 2016-09-20 DIAGNOSIS — L600 Ingrowing nail: Secondary | ICD-10-CM | POA: Diagnosis not present

## 2016-09-20 DIAGNOSIS — M2041 Other hammer toe(s) (acquired), right foot: Secondary | ICD-10-CM | POA: Diagnosis not present

## 2016-09-20 DIAGNOSIS — E1151 Type 2 diabetes mellitus with diabetic peripheral angiopathy without gangrene: Secondary | ICD-10-CM | POA: Diagnosis not present

## 2016-09-20 DIAGNOSIS — M2042 Other hammer toe(s) (acquired), left foot: Secondary | ICD-10-CM | POA: Diagnosis not present

## 2016-10-01 DIAGNOSIS — Z23 Encounter for immunization: Secondary | ICD-10-CM | POA: Diagnosis not present

## 2016-10-02 DIAGNOSIS — I70203 Unspecified atherosclerosis of native arteries of extremities, bilateral legs: Secondary | ICD-10-CM | POA: Diagnosis not present

## 2016-10-08 ENCOUNTER — Other Ambulatory Visit: Payer: Self-pay | Admitting: Cardiology

## 2016-10-09 ENCOUNTER — Other Ambulatory Visit: Payer: Self-pay

## 2016-10-11 DIAGNOSIS — M2042 Other hammer toe(s) (acquired), left foot: Secondary | ICD-10-CM | POA: Diagnosis not present

## 2016-10-11 DIAGNOSIS — M2041 Other hammer toe(s) (acquired), right foot: Secondary | ICD-10-CM | POA: Diagnosis not present

## 2016-10-11 DIAGNOSIS — L600 Ingrowing nail: Secondary | ICD-10-CM | POA: Diagnosis not present

## 2016-11-04 DIAGNOSIS — E46 Unspecified protein-calorie malnutrition: Secondary | ICD-10-CM | POA: Diagnosis not present

## 2016-11-04 DIAGNOSIS — M545 Low back pain: Secondary | ICD-10-CM | POA: Diagnosis not present

## 2016-11-04 DIAGNOSIS — E1151 Type 2 diabetes mellitus with diabetic peripheral angiopathy without gangrene: Secondary | ICD-10-CM | POA: Diagnosis not present

## 2016-11-04 DIAGNOSIS — R2689 Other abnormalities of gait and mobility: Secondary | ICD-10-CM | POA: Diagnosis not present

## 2016-11-04 DIAGNOSIS — Z6820 Body mass index (BMI) 20.0-20.9, adult: Secondary | ICD-10-CM | POA: Diagnosis not present

## 2016-11-04 DIAGNOSIS — G4733 Obstructive sleep apnea (adult) (pediatric): Secondary | ICD-10-CM | POA: Diagnosis not present

## 2016-11-12 ENCOUNTER — Ambulatory Visit: Payer: Medicare Other | Admitting: Adult Health

## 2016-11-22 ENCOUNTER — Other Ambulatory Visit: Payer: Self-pay | Admitting: Cardiology

## 2016-11-27 ENCOUNTER — Encounter: Payer: Self-pay | Admitting: Adult Health

## 2016-11-27 ENCOUNTER — Ambulatory Visit (INDEPENDENT_AMBULATORY_CARE_PROVIDER_SITE_OTHER): Payer: Medicare Other | Admitting: Adult Health

## 2016-11-27 VITALS — BP 133/66 | HR 60 | Resp 16 | Ht 65.0 in | Wt 131.0 lb

## 2016-11-27 DIAGNOSIS — R269 Unspecified abnormalities of gait and mobility: Secondary | ICD-10-CM

## 2016-11-27 DIAGNOSIS — R413 Other amnesia: Secondary | ICD-10-CM | POA: Diagnosis not present

## 2016-11-27 DIAGNOSIS — I6503 Occlusion and stenosis of bilateral vertebral arteries: Secondary | ICD-10-CM | POA: Diagnosis not present

## 2016-11-27 NOTE — Progress Notes (Signed)
I agree with the assessment and plan as directed by NP .The patient is known to me .   Khalil Szczepanik, MD  

## 2016-11-27 NOTE — Patient Instructions (Signed)
Memory score is stable Consider PACE program Follow-up with PCP regarding balance may need PT If your symptoms worsen or you develop new symptoms please let us know.

## 2016-11-27 NOTE — Progress Notes (Signed)
PATIENT: Donna Owens DOB: Jul 12, 1928  REASON FOR VISIT: follow up- memory HISTORY FROM: patient  HISTORY OF PRESENT ILLNESS: Donna Owens is an 82 year old female with a history of mild cognitive impairment. She returns today for follow-up. The patient reports that her memory has remained the same. However her daughter thinks it may be slightly worse. She lives at home alone. She is able to complete all ADLs independently. She does prepare her own meals but she is guilty of burning the food. She states that sometimes she will sit down while her food is cooking and accidentally fall asleep. She states that her sleep routine is fragmented. She typically sleeps more throughout the day and then will not go to bed till 2 or 3 AM. She denies any hallucinations. Denies tremor. She denies any significant depression although she does report that she is not socially active. Reports that she is reclusive to her home. She has not looked to get involved in any senior citizen activities. She returns today for an evaluation.  HISTORY 08/12/16: Donna Owens is an 82 year old female with a history of mild cognitive impairment. She returns today for follow-up. The patient is none on any medication for her memory. There has been possible depression noted in the past. The patient's daughter is with her today reports that the patient is able to complete all ADLs with minimal assistance. She does not operate a motor vehicle. She no longer cooks due to burning her food. She reports that she tends to stay up late and nap during the day. She denies any hallucinations. Reports that her mood fluctuates. She does states that she is not very active and does not socialize with others often. She has considered participate in senior citizen. She reports that she does have cardiac history including atrial fibrillation and congestive heart failure. She is not aware of any heart block. She returns today for an evaluation.  HISTORY  Interval history from 01/23/2016 per Dr. Brett Fairy notes: Donna Owens is here today for a revisit. She reports that she has started to eat more take out food after she had a couple of meals burn on the stove. She states that usually the telephone rings and it distracts her but she is cooking leaving her with a mass on the stove. We discussed today that the very high salt content of many processed foods may not be to her benefit and she may consider using a meal service. I think that she would enjoy cooking if she wouldn't have to shop for single portion preparation. Her closest family is her daughter and her daughter's children. She reports that there has been no complained about cognitive decline on memory failures. She also feels not excessively daytime sleepy. We performed today and mini mental status examination at the patient scored 24 points on the animal fluency test today she scored only 7 points which is lower than last time and she had significant difficulties with copying an image or drawing a clock face. 24-30 is indicative of mild dementia.   REVIEW OF SYSTEMS: Out of a complete 14 system review of symptoms, the patient complains only of the following symptoms, and all other reviewed systems are negative.  Joint pain, back pain, muscle cramps, walking difficulty, neck pain, rash, itching, depression, anxious, confusion, tremors, speech difficulty, headache, frequent waking, restless leg, flushing, eye itching, chest pain, palpitations, hearing loss, runny nose, drooling  ALLERGIES: No Known Allergies  HOME MEDICATIONS: Outpatient Medications Prior to Visit  Medication Sig Dispense Refill  . acetaminophen (TYLENOL) 500 MG tablet Take 500 mg by mouth every 6 (six) hours as needed for moderate pain.    Marland Kitchen amLODipine (NORVASC) 5 MG tablet TAKE 1 TABLET(5 MG) BY MOUTH DAILY 30 tablet 0  . apixaban (ELIQUIS) 2.5 MG TABS tablet Take 1 tablet (2.5 mg total) by mouth 2 (two) times daily. 60 tablet 11    . calcium-vitamin D (OSCAL WITH D) 500-200 MG-UNIT per tablet Take 1 tablet by mouth daily with breakfast.     . carvedilol (COREG) 3.125 MG tablet Take 1 tablet (3.125 mg total) by mouth 2 (two) times daily with a meal. 180 tablet 1  . Cholecalciferol (VITAMIN D-3) 1000 UNITS CAPS Take 1 capsule by mouth daily.    . Coenzyme Q10 (CO Q 10) 100 MG CAPS Take 1 capsule by mouth daily.    . diazepam (VALIUM) 5 MG tablet Take 1 tablet (5 mg total) by mouth every 8 (eight) hours as needed (dizziness). 5 tablet 0  . econazole nitrate 1 % cream Apply topically daily. 15 g 0  . ferrous sulfate 325 (65 FE) MG tablet Take 1 tablet by mouth daily.  12  . furosemide (LASIX) 20 MG tablet Take 1 tablet (20 mg total) by mouth 5 days a week. 90 tablet 3  . gabapentin (NEURONTIN) 300 MG capsule Take 1 capsule (300 mg total) by mouth at bedtime. 90 capsule 11  . hydrOXYzine (ATARAX/VISTARIL) 25 MG tablet Take 1 tablet (25 mg total) by mouth 3 (three) times daily as needed for itching. 30 tablet 2  . lisinopril (PRINIVIL,ZESTRIL) 20 MG tablet TAKE 2 TABLETS BY MOUTH DAILY 180 tablet 3  . metFORMIN (GLUCOPHAGE) 500 MG tablet Take 1 tablet by mouth daily.  3  . Polyethyl Glycol-Propyl Glycol (SYSTANE OP) Place 1 drop into both eyes 2 (two) times daily. SYSTANE    . pravastatin (PRAVACHOL) 40 MG tablet Take 40 mg by mouth daily.    . promethazine (PHENERGAN) 25 MG tablet Take 25 mg by mouth daily. nausea    . traMADol (ULTRAM) 50 MG tablet Take 1 tablet (50 mg total) by mouth every 6 (six) hours as needed. 15 tablet 0  . triamcinolone cream (KENALOG) 0.1 % Apply 1 application topically 2 (two) times daily. (Patient taking differently: Apply 1 application topically 2 (two) times daily as needed (rash/sore). On left foot.) 30 g 0  . vitamin B-12 (CYANOCOBALAMIN) 1000 MCG tablet Take 1,000 mcg by mouth 2 (two) times daily.      No facility-administered medications prior to visit.     PAST MEDICAL HISTORY: Past  Medical History:  Diagnosis Date  . Anemia   . Atrophic vaginitis   . Blood transfusion without reported diagnosis   . Cancer ([place])   . Carotid arterial disease ([place]) 12/13/11    mild by doppler  . CVA (cerebral vascular accident) ([place])   . Diabetes mellitus   . Hyperlipemia   . Hypertension   . OSA (obstructive sleep apnea)   . OSA on CPAP 06/20/2014  . Osteoporosis   . Pulmonary hypertension    moderate echo 11/07/10  . PVD (peripheral vascular disease) ([place])   . Sleep apnea     PAST SURGICAL HISTORY: Past Surgical History:  Procedure Laterality Date  . BREAST LUMPECTOMY Right    chemo/radiation  . BREAST LUMPECTOMY Left    benign  . BREAST SURGERY     right 07/26/2000  . CARDIOVERSION N/A 07/21/2014  Procedure: CARDIOVERSION;  Surgeon: Sanda Klein, MD;  Location: [facility] ENDOSCOPY;  Service: Cardiovascular;  Laterality: N/A;  . COLONOSCOPY  2010  . ingrown toenails     removed in 03/2013    FAMILY HISTORY: Family History  Problem Relation Age of Onset  . Diabetes Brother   . Hypertension Brother   . Heart failure Daughter 50  . Diabetes Other     SOCIAL HISTORY: Social History   Social History  . Marital status: Divorced    Spouse name: N/A  . Number of children: 1  . Years of education: BSN   Occupational History  . RETIRED Retired   Social History Main Topics  . Smoking status: Never Smoker  . Smokeless tobacco: Never Used  . Alcohol use No  . Drug use: No  . Sexual activity: No   Other Topics Concern  . Not on file   Social History Narrative   Patient is divorced and lives alone.   Patient has one adult daughter.   Patient is retired.   Patient has a BSN degree in nursing.   Patient is right-handed.   Patient drinks one soda per week.      PHYSICAL EXAM  Vitals:   11/27/16 1140  BP: 133/66  Pulse: 60  Resp: 16  Weight: 131 lb (59.4 kg)  Height: 5\' 5"  (1.651 m)   Body mass index is 21.8 kg/m.   MMSE - Mini Mental State Exam  11/27/2016 08/12/2016 01/23/2016  Orientation to time 5 5 4   Orientation to Place 5 5 4   Registration 3 3 3   Attention/ Calculation 5 2 5   Recall 0 1 1  Language- name 2 objects 2 2 2   Language- repeat 1 0 0  Language- follow 3 step command 2 3 3   Language- read & follow direction 1 1 1   Write a sentence 1 1 1   Copy design 1 0 0  Total score 26 23 24      Generalized: Well developed, in no acute distress   Neurological examination  Mentation: Alert oriented to time, place, history taking. Follows all commands speech and language fluent Cranial nerve II-XII: Pupils were equal round reactive to light. Extraocular movements were full, visual field were full on confrontational test. Facial sensation and strength were normal. Uvula tongue midline. Head turning and shoulder shrug  were normal and symmetric. Motor: The motor testing reveals 5 over 5 strength of all 4 extremities. Good symmetric motor tone is noted throughout.  Sensory: Sensory testing is intact to soft touch on all 4 extremities. No evidence of extinction is noted.  Coordination: Cerebellar testing reveals good finger-nose-finger and heel-to-shin bilaterally.  Gait and station: Gait is unsteady. Uses a cane when ambulating. Reflexes: Deep tendon reflexes are symmetric and normal bilaterally.   DIAGNOSTIC DATA (LABS, IMAGING, TESTING) - I reviewed patient records, labs, notes, testing and imaging myself where available.  Lab Results  Component Value Date   WBC 5.8 06/19/2016   HGB 11.6 (L) 06/19/2016   HCT 34.6 (L) 06/19/2016   MCV 89.2 06/19/2016   PLT 148 (L) 06/19/2016      Component Value Date/Time   NA 138 06/19/2016 1421   NA 139 06/21/2015 1358   K 4.2 06/19/2016 1421   CL 108 06/19/2016 1421   CO2 22 06/19/2016 1421   GLUCOSE 91 06/19/2016 1421   BUN 26 (H) 06/19/2016 1421   BUN 33 (H) 06/21/2015 1358   CREATININE 1.35 (H) 06/19/2016 1421   CREATININE 1.23 (H)  10/05/2015 1151   CALCIUM 9.4 06/19/2016  1421   PROT 6.9 07/21/2015 1124   PROT 6.7 06/21/2015 1358   ALBUMIN 3.8 07/21/2015 1124   ALBUMIN 4.1 06/21/2015 1358   AST 23 07/21/2015 1124   ALT 17 07/21/2015 1124   ALKPHOS 45 07/21/2015 1124   BILITOT 0.7 07/21/2015 1124   BILITOT 0.3 06/21/2015 1358   GFRNONAA 34 (L) 06/19/2016 1421   GFRNONAA 45 (L) 06/29/2014 1607   GFRAA 40 (L) 06/19/2016 1421   GFRAA 52 (L) 06/29/2014 1607   Lab Results  Component Value Date   CHOL 139 02/02/2014   HDL 54 02/02/2014   LDLCALC 76 02/02/2014   TRIG 47 02/02/2014   CHOLHDL 2.6 02/02/2014   Lab Results  Component Value Date   HGBA1C 5.4 02/02/2014   No results found for: [id number] Lab Results  Component Value Date   TSH 0.863 07/21/2015      ASSESSMENT AND PLAN 82 y.o. year old female  has a past medical history of Anemia; Atrophic vaginitis; Blood transfusion without reported diagnosis; Cancer ([place]); Carotid arterial disease ([place]) (12/13/11); CVA (cerebral vascular accident) ([place]); Diabetes mellitus; Hyperlipemia; Hypertension; OSA (obstructive sleep apnea); OSA on CPAP (06/20/2014); Osteoporosis; Pulmonary hypertension; PVD (peripheral vascular disease) ([place]); and Sleep apnea. here with:  1. Memory disturbance 2. Abnormality of gait  The patient's memory score has improved. For now we will continue to monitor. I do feel that the patient should adopt a regular sleep routine. She should also consider the PACE program. I have provided her with the number for this program. The patient does have some difficulty with her balance she has followed up with her primary care in regards to this. She may require physical therapy. Patient will return in 6 months with Dr. Brett Fairy.     Ward Givens, MSN, NP-C 11/27/2016, 11:49 AM [facility] [address] [place], [place] [address] [phone]

## 2016-11-29 ENCOUNTER — Ambulatory Visit: Payer: Medicare Other | Admitting: Podiatry

## 2016-12-06 ENCOUNTER — Other Ambulatory Visit: Payer: Self-pay | Admitting: Cardiology

## 2016-12-11 ENCOUNTER — Other Ambulatory Visit: Payer: Self-pay | Admitting: Cardiovascular Disease

## 2016-12-31 ENCOUNTER — Ambulatory Visit: Payer: Medicare Other | Admitting: Podiatry

## 2016-12-31 ENCOUNTER — Encounter (HOSPITAL_COMMUNITY): Payer: Medicare Other

## 2016-12-31 ENCOUNTER — Ambulatory Visit: Payer: Medicare Other | Admitting: Vascular Surgery

## 2017-01-27 ENCOUNTER — Encounter: Payer: Self-pay | Admitting: Cardiovascular Disease

## 2017-01-27 ENCOUNTER — Ambulatory Visit (INDEPENDENT_AMBULATORY_CARE_PROVIDER_SITE_OTHER): Payer: Medicare Other | Admitting: Cardiovascular Disease

## 2017-01-27 VITALS — BP 149/73 | HR 51 | Ht 65.0 in | Wt 132.2 lb

## 2017-01-27 DIAGNOSIS — I6521 Occlusion and stenosis of right carotid artery: Secondary | ICD-10-CM

## 2017-01-27 DIAGNOSIS — E785 Hyperlipidemia, unspecified: Secondary | ICD-10-CM | POA: Diagnosis not present

## 2017-01-27 DIAGNOSIS — I4891 Unspecified atrial fibrillation: Secondary | ICD-10-CM | POA: Diagnosis not present

## 2017-01-27 DIAGNOSIS — I5032 Chronic diastolic (congestive) heart failure: Secondary | ICD-10-CM

## 2017-01-27 DIAGNOSIS — I10 Essential (primary) hypertension: Secondary | ICD-10-CM

## 2017-01-27 DIAGNOSIS — G3184 Mild cognitive impairment, so stated: Secondary | ICD-10-CM

## 2017-01-27 DIAGNOSIS — E1151 Type 2 diabetes mellitus with diabetic peripheral angiopathy without gangrene: Secondary | ICD-10-CM

## 2017-01-27 DIAGNOSIS — R296 Repeated falls: Secondary | ICD-10-CM

## 2017-01-27 DIAGNOSIS — I739 Peripheral vascular disease, unspecified: Secondary | ICD-10-CM

## 2017-01-27 MED ORDER — FUROSEMIDE 20 MG PO TABS: 20.0000 mg | ORAL_TABLET | ORAL | 3 refills | Status: DC
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
## 2017-01-27 NOTE — Patient Instructions (Signed)
Dr Sallyanne Kuster has recommended making the following medication changes: 1. STOP Carvedilol 2. DECREASE Furosemide to 3 days a week - Mondays, Wednesdays, and Fridays  Your physician recommends that you schedule a follow-up appointment in 3 months with Dr C.  If you need a refill on your cardiac medications before your next appointment, please call your pharmacy.

## 2017-01-28 ENCOUNTER — Encounter: Payer: Self-pay | Admitting: Vascular Surgery

## 2017-02-04 ENCOUNTER — Ambulatory Visit: Payer: Medicare Other | Admitting: Vascular Surgery

## 2017-02-04 ENCOUNTER — Ambulatory Visit (HOSPITAL_COMMUNITY): Payer: Medicare Other | Attending: Vascular Surgery

## 2017-02-06 ENCOUNTER — Other Ambulatory Visit: Payer: Self-pay

## 2017-02-06 DIAGNOSIS — E1151 Type 2 diabetes mellitus with diabetic peripheral angiopathy without gangrene: Secondary | ICD-10-CM | POA: Diagnosis not present

## 2017-02-06 DIAGNOSIS — I6789 Other cerebrovascular disease: Secondary | ICD-10-CM | POA: Diagnosis not present

## 2017-02-06 DIAGNOSIS — I4892 Unspecified atrial flutter: Secondary | ICD-10-CM | POA: Diagnosis not present

## 2017-02-06 DIAGNOSIS — E46 Unspecified protein-calorie malnutrition: Secondary | ICD-10-CM | POA: Diagnosis not present

## 2017-02-06 DIAGNOSIS — Z6820 Body mass index (BMI) 20.0-20.9, adult: Secondary | ICD-10-CM | POA: Diagnosis not present

## 2017-02-06 DIAGNOSIS — R2689 Other abnormalities of gait and mobility: Secondary | ICD-10-CM | POA: Diagnosis not present

## 2017-02-06 DIAGNOSIS — G4733 Obstructive sleep apnea (adult) (pediatric): Secondary | ICD-10-CM | POA: Diagnosis not present

## 2017-02-06 DIAGNOSIS — M545 Low back pain: Secondary | ICD-10-CM | POA: Diagnosis not present

## 2017-02-06 DIAGNOSIS — Z1389 Encounter for screening for other disorder: Secondary | ICD-10-CM | POA: Diagnosis not present

## 2017-02-06 DIAGNOSIS — N183 Chronic kidney disease, stage 3 (moderate): Secondary | ICD-10-CM | POA: Diagnosis not present

## 2017-02-06 DIAGNOSIS — H6123 Impacted cerumen, bilateral: Secondary | ICD-10-CM | POA: Diagnosis not present

## 2017-02-07 DIAGNOSIS — M89372 Hypertrophy of bone, left ankle and foot: Secondary | ICD-10-CM | POA: Diagnosis not present

## 2017-02-07 DIAGNOSIS — M2041 Other hammer toe(s) (acquired), right foot: Secondary | ICD-10-CM | POA: Diagnosis not present

## 2017-02-07 DIAGNOSIS — E1151 Type 2 diabetes mellitus with diabetic peripheral angiopathy without gangrene: Secondary | ICD-10-CM | POA: Diagnosis not present

## 2017-02-07 DIAGNOSIS — M2042 Other hammer toe(s) (acquired), left foot: Secondary | ICD-10-CM | POA: Diagnosis not present

## 2017-02-28 ENCOUNTER — Encounter: Payer: Self-pay | Admitting: Vascular Surgery

## 2017-03-07 ENCOUNTER — Other Ambulatory Visit: Payer: Self-pay | Admitting: Cardiovascular Disease

## 2017-03-10 ENCOUNTER — Other Ambulatory Visit: Payer: Self-pay

## 2017-03-10 DIAGNOSIS — I6523 Occlusion and stenosis of bilateral carotid arteries: Secondary | ICD-10-CM

## 2017-03-11 ENCOUNTER — Ambulatory Visit (INDEPENDENT_AMBULATORY_CARE_PROVIDER_SITE_OTHER): Payer: Medicare Other | Admitting: Vascular Surgery

## 2017-03-11 ENCOUNTER — Ambulatory Visit (HOSPITAL_COMMUNITY)
Admission: RE | Admit: 2017-03-11 | Discharge: 2017-03-11 | Disposition: A | Discharge status: Home / Self Care | Payer: Medicare Other | Source: Ambulatory Visit | Attending: Vascular Surgery | Admitting: Vascular Surgery

## 2017-03-11 ENCOUNTER — Encounter: Payer: Self-pay | Admitting: Vascular Surgery

## 2017-03-11 VITALS — BP 144/70 | HR 56 | Temp 96.6°F | Resp 16 | Ht 65.0 in | Wt 132.0 lb

## 2017-03-11 DIAGNOSIS — Z8679 Personal history of other diseases of the circulatory system: Secondary | ICD-10-CM

## 2017-03-11 DIAGNOSIS — I6523 Occlusion and stenosis of bilateral carotid arteries: Secondary | ICD-10-CM | POA: Diagnosis not present

## 2017-03-11 LAB — VAS US CAROTID
LCCAPDIAS: 10 cm/s
LCCAPSYS: 94 cm/s
LEFT ECA DIAS: -2 cm/s
LEFT VERTEBRAL DIAS: -9 cm/s
LICAPDIAS: -17 cm/s
Left CCA dist dias: -14 cm/s
Left CCA dist sys: -95 cm/s
Left ICA dist dias: -21 cm/s
Left ICA dist sys: -92 cm/s
Left ICA prox sys: -118 cm/s
RCCAPDIAS: 2 cm/s
RCCAPSYS: 98 cm/s
RIGHT CCA MID DIAS: 9 cm/s
RIGHT ECA DIAS: -2 cm/s
RIGHT VERTEBRAL DIAS: -6 cm/s
Right cca dist sys: -110 cm/s

## 2017-03-11 NOTE — Progress Notes (Signed)
[facility] [place]  Patient name: Donna Owens MRN: [id number] DOB: 1928/09/14 Sex: female  REASON FOR VISIT: carotid follow-up  HPI: Donna Owens is a 83 y.o. female who presents for routine carotid follow-up. She previously had a CTA neck in June 2017 that suggested a 40-60% right internal carotid stenosis during headache work-up. She denies any TIA or stroke symptoms. She does report having frequent falls, unsteady gait and dizziness. She has been followed by Dr. Brett Fairy with neurology regarding this.   She has atrial fibrillation on Eliquis. She is followed by Dr. Sallyanne Kuster.   She also mentions having a recent episode where her feet and toes were "turning dark." She was prescribed an antifungal per her daughter and her symptoms cleared up.   She has a past medical history of hypertension, hyperlipidemia and diabetes. These are stable.   Past Medical History:  Diagnosis Date  . Anemia   . Atrophic vaginitis   . Blood transfusion without reported diagnosis   . Cancer ([place])   . Carotid arterial disease ([place]) 12/13/11    mild by doppler  . CVA (cerebral vascular accident) ([place])   . Diabetes mellitus   . Hyperlipemia   . Hypertension   . OSA (obstructive sleep apnea)   . OSA on CPAP 06/20/2014  . Osteoporosis   . Pulmonary hypertension    moderate echo 11/07/10  . PVD (peripheral vascular disease) ([place])   . Sleep apnea     Family History  Problem Relation Age of Onset  . Diabetes Brother   . Hypertension Brother   . Heart failure Daughter 17  . Diabetes Other     SOCIAL HISTORY: Social History  Substance Use Topics  . Smoking status: Never Smoker  . Smokeless tobacco: Never Used  . Alcohol use No    No Known Allergies  Current Outpatient Prescriptions  Medication Sig Dispense Refill  . acetaminophen (TYLENOL) 500 MG tablet Take 500 mg by mouth every 6 (six) hours as needed for moderate pain.    Marland Kitchen amLODipine (NORVASC) 5 MG tablet  TAKE 1 TABLET(5 MG) BY MOUTH DAILY 30 tablet 7  . calcium-vitamin D (OSCAL WITH D) 500-200 MG-UNIT per tablet Take 1 tablet by mouth daily with breakfast.     . Cholecalciferol (VITAMIN D-3) 1000 UNITS CAPS Take 1 capsule by mouth daily.    . Coenzyme Q10 (CO Q 10) 100 MG CAPS Take 1 capsule by mouth daily.    . diazepam (VALIUM) 5 MG tablet Take 1 tablet (5 mg total) by mouth every 8 (eight) hours as needed (dizziness). 5 tablet 0  . econazole nitrate 1 % cream Apply topically daily. 15 g 0  . ELIQUIS 2.5 MG TABS tablet TAKE 1 TABLET BY MOUTH TWICE DAILY 120 tablet 0  . ferrous sulfate 325 (65 FE) MG tablet Take 1 tablet by mouth daily.  12  . furosemide (LASIX) 20 MG tablet Take 1 tablet (20 mg total) by mouth 3 (three) times a week. Mondays, Wednesdays, and Fridays 45 tablet 3  . gabapentin (NEURONTIN) 300 MG capsule Take 1 capsule (300 mg total) by mouth at bedtime. 90 capsule 11  . hydrOXYzine (ATARAX/VISTARIL) 25 MG tablet Take 1 tablet (25 mg total) by mouth 3 (three) times daily as needed for itching. 30 tablet 2  . lisinopril (PRINIVIL,ZESTRIL) 20 MG tablet TAKE 2 TABLETS BY MOUTH DAILY 180 tablet 3  . metFORMIN (GLUCOPHAGE) 500 MG tablet Take 1 tablet by mouth daily.  3  . Polyethyl Glycol-Propyl Glycol (SYSTANE OP) Place 1 drop into both eyes 2 (two) times daily. SYSTANE    . pravastatin (PRAVACHOL) 40 MG tablet Take 40 mg by mouth daily.    . promethazine (PHENERGAN) 25 MG tablet Take 25 mg by mouth daily. nausea    . traMADol (ULTRAM) 50 MG tablet Take 1 tablet (50 mg total) by mouth every 6 (six) hours as needed. 15 tablet 0  . triamcinolone cream (KENALOG) 0.1 % Apply 1 application topically 2 (two) times daily. (Patient taking differently: Apply 1 application topically 2 (two) times daily as needed (rash/sore). On left foot.) 30 g 0  . vitamin B-12 (CYANOCOBALAMIN) 1000 MCG tablet Take 1,000 mcg by mouth 2 (two) times daily.      No current facility-administered medications for  this visit.     REVIEW OF SYSTEMS:  [X]  denotes positive finding, [ ]  denotes negative finding Cardiac  Comments:  Chest pain or chest pressure: x   Shortness of breath upon exertion: x   Short of breath when lying flat:    Irregular heart rhythm:        Vascular    Pain in calf, thigh, or hip brought on by ambulation: x   Pain in feet at night that wakes you up from your sleep:  x   Blood clot in your veins:    Leg swelling:  x       Pulmonary    Oxygen at home:    Productive cough:     Wheezing:         Neurologic    Sudden weakness in arms or legs:     Sudden numbness in arms or legs:     Sudden onset of difficulty speaking or slurred speech:    Temporary loss of vision in one eye:     Problems with dizziness:         Gastrointestinal    Blood in stool:     Vomited blood:         Genitourinary    Burning when urinating:     Blood in urine:        Psychiatric    Major depression:         Hematologic    Bleeding problems:    Problems with blood clotting too easily:        Skin    Rashes or ulcers:        Constitutional    Fever or chills:      PHYSICAL EXAM: Vitals:   03/11/17 1358  BP: (!) 144/70  Pulse: (!) 56  Resp: 16  Temp: (!) 96.6 F (35.9 C)  TempSrc: Oral  SpO2: 100%  Weight: 132 lb (59.9 kg)  Height: 5\' 5"  (1.651 m)    GENERAL: The patient is a well-nourished female, in no acute distress. The vital signs are documented above. CARDIAC: There is a regular rate and rhythm. No carotid bruits.  VASCULAR: 2+ radial and dorsalis pedis pulses bilaterally. Feet without ischemic changes.  PULMONARY: There is good air exchange bilaterally without wheezing or rales. MUSCULOSKELETAL: There are no major deformities or cyanosis. NEUROLOGIC: No focal weakness or paresthesias are detected. SKIN: There are no ulcers or rashes noted.  PSYCHIATRIC: The patient has a normal affect.  DATA:  Carotid duplex 03/11/17  Less than 40% internal carotid duplex  bilaterally.  Vertebral arteries patent with antegrade flow bilaterally.   MEDICAL ISSUES: Asymptomatic mild carotid stenosis  Patient had previous  CTA in June 2017 that showed 50-60% right carotid stenosis. Today's ultrasound shows less than 40% internal carotid stenosis bilaterally. The CTA likely overestimated the amount of stenosis. Will not continue to follow arteries, unless the patient developed any TIA or stroke symptoms.   Dizziness and frequent falls  There is some calcifications of her vertebral arteries bilaterally that are not flow limiting. Discussed that vertebral artery disease is a possible cause of dizziness and falls but is unlikely. Will defer continued work-up to her neurologist, Dr. Brett Fairy   The patient will follow-up prn.   Virgina Jock, PA-C [facility] MD: Rovena Hearld  I have examined the patient, reviewed and agree with above. Duplex shows no significant carotid stenosis. Discussed this with the patient and her family and would not recommend any ongoing follow-up of this. She does have antegrade flow in her vertebral arteries bilaterally however there is some calcification in her old study of CT from 9 months ago. I discussed this as well explaining this be an unlikely cause for her unsteady gait and would not recommend further evaluation of this only if felt pertinent by Dr.Dohmeier. We will not schedule active follow-up.  Curt Jews, MD 03/11/2017 3:14 PM

## 2017-03-11 NOTE — Progress Notes (Signed)
Thanks, [employer]

## 2017-03-13 ENCOUNTER — Ambulatory Visit (INDEPENDENT_AMBULATORY_CARE_PROVIDER_SITE_OTHER): Payer: Medicare Other | Admitting: Ophthalmology

## 2017-03-13 DIAGNOSIS — H43813 Vitreous degeneration, bilateral: Secondary | ICD-10-CM | POA: Diagnosis not present

## 2017-03-13 DIAGNOSIS — H35033 Hypertensive retinopathy, bilateral: Secondary | ICD-10-CM

## 2017-03-13 DIAGNOSIS — E11319 Type 2 diabetes mellitus with unspecified diabetic retinopathy without macular edema: Secondary | ICD-10-CM | POA: Diagnosis not present

## 2017-03-13 DIAGNOSIS — I10 Essential (primary) hypertension: Secondary | ICD-10-CM | POA: Diagnosis not present

## 2017-03-13 DIAGNOSIS — E113393 Type 2 diabetes mellitus with moderate nonproliferative diabetic retinopathy without macular edema, bilateral: Secondary | ICD-10-CM | POA: Diagnosis not present

## 2017-03-18 DIAGNOSIS — H40013 Open angle with borderline findings, low risk, bilateral: Secondary | ICD-10-CM | POA: Diagnosis not present

## 2017-03-18 DIAGNOSIS — Z961 Presence of intraocular lens: Secondary | ICD-10-CM | POA: Diagnosis not present

## 2017-03-18 DIAGNOSIS — E113293 Type 2 diabetes mellitus with mild nonproliferative diabetic retinopathy without macular edema, bilateral: Secondary | ICD-10-CM | POA: Diagnosis not present

## 2017-03-18 DIAGNOSIS — H5021 Vertical strabismus, right eye: Secondary | ICD-10-CM | POA: Diagnosis not present

## 2017-03-18 DIAGNOSIS — H532 Diplopia: Secondary | ICD-10-CM | POA: Diagnosis not present

## 2017-03-25 DIAGNOSIS — H578 Other specified disorders of eye and adnexa: Secondary | ICD-10-CM | POA: Diagnosis not present

## 2017-03-25 DIAGNOSIS — R410 Disorientation, unspecified: Secondary | ICD-10-CM | POA: Diagnosis not present

## 2017-03-25 DIAGNOSIS — H532 Diplopia: Secondary | ICD-10-CM | POA: Diagnosis not present

## 2017-03-25 DIAGNOSIS — R296 Repeated falls: Secondary | ICD-10-CM | POA: Diagnosis not present

## 2017-03-25 DIAGNOSIS — R42 Dizziness and giddiness: Secondary | ICD-10-CM | POA: Diagnosis not present

## 2017-03-25 DIAGNOSIS — R51 Headache: Secondary | ICD-10-CM | POA: Diagnosis not present

## 2017-03-25 DIAGNOSIS — H538 Other visual disturbances: Secondary | ICD-10-CM | POA: Diagnosis not present

## 2017-03-25 DIAGNOSIS — Z8673 Personal history of transient ischemic attack (TIA), and cerebral infarction without residual deficits: Secondary | ICD-10-CM | POA: Diagnosis not present

## 2017-04-09 DIAGNOSIS — H532 Diplopia: Secondary | ICD-10-CM | POA: Diagnosis not present

## 2017-04-09 DIAGNOSIS — Z961 Presence of intraocular lens: Secondary | ICD-10-CM | POA: Diagnosis not present

## 2017-04-09 DIAGNOSIS — H5021 Vertical strabismus, right eye: Secondary | ICD-10-CM | POA: Diagnosis not present

## 2017-04-22 DIAGNOSIS — H04123 Dry eye syndrome of bilateral lacrimal glands: Secondary | ICD-10-CM | POA: Diagnosis not present

## 2017-04-22 DIAGNOSIS — H1132 Conjunctival hemorrhage, left eye: Secondary | ICD-10-CM | POA: Diagnosis not present

## 2017-04-22 DIAGNOSIS — H10413 Chronic giant papillary conjunctivitis, bilateral: Secondary | ICD-10-CM | POA: Diagnosis not present

## 2017-04-28 ENCOUNTER — Ambulatory Visit (INDEPENDENT_AMBULATORY_CARE_PROVIDER_SITE_OTHER): Payer: Medicare Other | Admitting: Cardiovascular Disease

## 2017-04-28 ENCOUNTER — Encounter: Payer: Self-pay | Admitting: Cardiovascular Disease

## 2017-04-28 VITALS — BP 140/70 | HR 53 | Ht 65.0 in | Wt 135.2 lb

## 2017-04-28 DIAGNOSIS — I5032 Chronic diastolic (congestive) heart failure: Secondary | ICD-10-CM

## 2017-04-28 DIAGNOSIS — I4891 Unspecified atrial fibrillation: Secondary | ICD-10-CM

## 2017-04-28 DIAGNOSIS — G3184 Mild cognitive impairment, so stated: Secondary | ICD-10-CM | POA: Diagnosis not present

## 2017-04-28 DIAGNOSIS — I6523 Occlusion and stenosis of bilateral carotid arteries: Secondary | ICD-10-CM

## 2017-04-28 DIAGNOSIS — E785 Hyperlipidemia, unspecified: Secondary | ICD-10-CM | POA: Diagnosis not present

## 2017-04-28 DIAGNOSIS — E1151 Type 2 diabetes mellitus with diabetic peripheral angiopathy without gangrene: Secondary | ICD-10-CM | POA: Diagnosis not present

## 2017-04-28 DIAGNOSIS — I739 Peripheral vascular disease, unspecified: Secondary | ICD-10-CM

## 2017-04-28 DIAGNOSIS — I10 Essential (primary) hypertension: Secondary | ICD-10-CM | POA: Diagnosis not present

## 2017-04-28 NOTE — Patient Instructions (Signed)
Dr Croitoru recommends that you schedule a follow-up appointment in 6 months. You will receive a reminder letter in the mail two months in advance. If you don't receive a letter, please call our office to schedule the follow-up appointment.  If you need a refill on your cardiac medications before your next appointment, please call your pharmacy. 

## 2017-04-28 NOTE — Progress Notes (Signed)
Patient ID: Donna Owens, female   DOB: 1928/08/20, 83 y.o.   MRN: [id number] Patient ID: Donna Owens, female   DOB: 06-30-1928, 84 y.o.   MRN: [id number]    Cardiology Office Note    Date:  04/28/2017   ID:  TANIA STEINHAUSER, DOB January 10, 1928, MRN [id number]  PCP:  Leanna Battles, MD  Cardiologist:   Sanda Klein, MD   Chief Complaint  Patient presents with  . Follow-up    pain on right side of chest     History of Present Illness:  Donna Owens is a 83 y.o. female with long term persistent atrial fibrillation with slow ventricular response, diastolic heart failure, Moderate carotid stenosis (Dr. Donnetta Hutching), HTN, DM, hyperlipidemia, severe neuropathy with unsteady gait and frequent falls.   Although her gait is still a little unsteady she has only had one fall since her last appointment. This was not in anyway her fault, she tripped over the dog who was standing behind her. Thankfully she did not have serious injury. She has not had any bleeding problems. Although she has asked again about driving, she seems to have reconciled herself to not drive. She has not had dizziness or loss of consciousness. She is rarely aware of palpitations. There have been no changes in her memory since her last appointment.  Past Medical History:  Diagnosis Date  . Anemia   . Atrophic vaginitis   . Blood transfusion without reported diagnosis   . Cancer ([place])   . Carotid arterial disease ([place]) 12/13/11    mild by doppler  . CVA (cerebral vascular accident) ([place])   . Diabetes mellitus   . Hyperlipemia   . Hypertension   . OSA (obstructive sleep apnea)   . OSA on CPAP 06/20/2014  . Osteoporosis   . Pulmonary hypertension    moderate echo 11/07/10  . PVD (peripheral vascular disease) ([place])   . Sleep apnea     Past Surgical History:  Procedure Laterality Date  . BREAST LUMPECTOMY Right    chemo/radiation  . BREAST LUMPECTOMY Left    benign  . BREAST SURGERY     right 07/26/2000  . CARDIOVERSION  N/A 07/21/2014   Procedure: CARDIOVERSION;  Surgeon: Sanda Klein, MD;  Location: [facility];  Service: Cardiovascular;  Laterality: N/A;  . COLONOSCOPY  2010  . ingrown toenails     removed in 03/2013    Outpatient Medications Prior to Visit  Medication Sig Dispense Refill  . acetaminophen (TYLENOL) 500 MG tablet Take 500 mg by mouth every 6 (six) hours as needed for moderate pain.    Marland Kitchen amLODipine (NORVASC) 5 MG tablet TAKE 1 TABLET(5 MG) BY MOUTH DAILY 30 tablet 7  . calcium-vitamin D (OSCAL WITH D) 500-200 MG-UNIT per tablet Take 1 tablet by mouth daily with breakfast.     . Cholecalciferol (VITAMIN D-3) 1000 UNITS CAPS Take 1 capsule by mouth daily.    . Coenzyme Q10 (CO Q 10) 100 MG CAPS Take 1 capsule by mouth daily.    . diazepam (VALIUM) 5 MG tablet Take 1 tablet (5 mg total) by mouth every 8 (eight) hours as needed (dizziness). 5 tablet 0  . econazole nitrate 1 % cream Apply topically daily. 15 g 0  . ELIQUIS 2.5 MG TABS tablet TAKE 1 TABLET BY MOUTH TWICE DAILY 120 tablet 0  . ferrous sulfate 325 (65 FE) MG tablet Take 1 tablet by mouth daily.  12  . furosemide (LASIX) 20 MG tablet Take 1  tablet (20 mg total) by mouth 3 (three) times a week. Mondays, Wednesdays, and Fridays 45 tablet 3  . gabapentin (NEURONTIN) 300 MG capsule Take 1 capsule (300 mg total) by mouth at bedtime. 90 capsule 11  . hydrOXYzine (ATARAX/VISTARIL) 25 MG tablet Take 1 tablet (25 mg total) by mouth 3 (three) times daily as needed for itching. 30 tablet 2  . lisinopril (PRINIVIL,ZESTRIL) 20 MG tablet TAKE 2 TABLETS BY MOUTH DAILY 180 tablet 3  . metFORMIN (GLUCOPHAGE) 500 MG tablet Take 1 tablet by mouth daily.  3  . Polyethyl Glycol-Propyl Glycol (SYSTANE OP) Place 1 drop into both eyes 2 (two) times daily. SYSTANE    . pravastatin (PRAVACHOL) 40 MG tablet Take 40 mg by mouth daily.    . promethazine (PHENERGAN) 25 MG tablet Take 25 mg by mouth daily. nausea    . traMADol (ULTRAM) 50 MG tablet Take 1  tablet (50 mg total) by mouth every 6 (six) hours as needed. 15 tablet 0  . triamcinolone cream (KENALOG) 0.1 % Apply 1 application topically 2 (two) times daily. (Patient taking differently: Apply 1 application topically 2 (two) times daily as needed (rash/sore). On left foot.) 30 g 0  . vitamin B-12 (CYANOCOBALAMIN) 1000 MCG tablet Take 1,000 mcg by mouth 2 (two) times daily.      No facility-administered medications prior to visit.      Allergies:   Patient has no known allergies.   Social History   Social History  . Marital status: Divorced    Spouse name: N/A  . Number of children: 1  . Years of education: BSN   Occupational History  . RETIRED Retired   Social History Main Topics  . Smoking status: Never Smoker  . Smokeless tobacco: Never Used  . Alcohol use No  . Drug use: No  . Sexual activity: No   Other Topics Concern  . Not on file   Social History Narrative   Patient is divorced and lives alone.   Patient has one adult daughter.   Patient is retired.   Patient has a BSN degree in nursing.   Patient is right-handed.   Patient drinks one soda per week.     Family History:  The patient's family history includes Diabetes in her brother and other; Heart failure (age of onset: 2) in her daughter; Hypertension in her brother.   ROS:   Please see the history of present illness.    Review of Systems  Constitution: Negative for chills, fever, malaise/fatigue and weight loss.  HENT: Negative for congestion and nosebleeds.   Eyes: Negative for blurred vision, vision loss in left eye and vision loss in right eye.  Cardiovascular: Positive for leg swelling. Negative for chest pain, claudication, orthopnea, paroxysmal nocturnal dyspnea and syncope.  Respiratory: Negative for cough, shortness of breath and wheezing.   Endocrine: Negative for cold intolerance, polydipsia and polyuria.  Hematologic/Lymphatic: Does not bruise/bleed easily.  Skin: Negative for poor wound  healing and rash.  Musculoskeletal: Positive for arthritis and joint pain.  Gastrointestinal: Negative for abdominal pain, change in bowel habit, heartburn, hematemesis and melena.  Genitourinary: Negative for bladder incontinence and non-menstrual bleeding.  Neurological: Positive for light-headedness, loss of balance, numbness and paresthesias. Negative for focal weakness, headaches and vertigo.  Psychiatric/Behavioral: Negative for altered mental status, depression and memory loss.   All other systems reviewed and are negative.   PHYSICAL EXAM:   VS:  BP 140/70 (BP Location: Left Arm, Patient Position: Sitting, Cuff  Size: Normal)   Pulse (!) 53   Ht 5\' 5"  (1.651 m)   Wt 61.3 kg (135 lb 3.2 oz)   BMI 22.50 kg/m     General: Alert, oriented x3, no distress Head: no evidence of trauma, PERRL, EOMI, no exophtalmos or lid lag, no myxedema, no xanthelasma; normal ears, nose and oropharynx Neck: normal jugular venous pulsations and no hepatojugular reflux; brisk carotid pulses without delay and no carotid bruits Chest: clear to auscultation, no signs of consolidation by percussion or palpation, normal fremitus, symmetrical and full respiratory excursions Cardiovascular: normal position and quality of the apical impulse, irregular rhythm, normal first and second heart sounds, no murmurs, rubs or gallops Abdomen: no tenderness or distention, no masses by palpation, no abnormal pulsatility or arterial bruits, normal bowel sounds, no hepatosplenomegaly Extremities: no clubbing, cyanosis or edema; 2+ radial, ulnar and brachial pulses bilaterally; 2+ right femoral, posterior tibial and dorsalis pedis pulses; 2+ left femoral, posterior tibial and dorsalis pedis pulses; no subclavian or femoral bruits Neurological: grossly nonfocal   Wt Readings from Last 3 Encounters:  04/28/17 61.3 kg (135 lb 3.2 oz)  03/11/17 59.9 kg (132 lb)  01/27/17 60 kg (132 lb 3.2 oz)      Studies/Labs Reviewed:    EKG:  EKG is ordered today.  ECG shows atrial fibrillation with slow ventricular response, otherwise unremarkable  Recent Labs: 06/19/2016: BUN 26; Creatinine, Ser 1.35; Hemoglobin 11.6; Platelets 148; Potassium 4.2; Sodium 138   Lipid Panel    Component Value Date/Time   CHOL 139 02/02/2014 1106   TRIG 47 02/02/2014 1106   HDL 54 02/02/2014 1106   CHOLHDL 2.6 02/02/2014 1106   VLDL 9 02/02/2014 1106   LDLCALC 76 02/02/2014 1106     ASSESSMENT:    1. Atrial fibrillation with slow ventricular response ([place])   2. Chronic diastolic heart failure ([place])   3. Essential hypertension   4. Dyslipidemia   5. Bilateral carotid artery stenosis   6. PAD (peripheral artery disease) ([place])   7. Type 2 diabetes mellitus with diabetic peripheral angiopathy without gangrene, without long-term current use of insulin ([place])   8. Amnestic MCI (mild cognitive impairment with memory loss)      PLAN:  In order of problems listed above:  1. Afib: CHADSVasc score 9. Avoid ASA and NSAIDs. If she has renewed problems with falls, may have to decide to stop anticoagulation permanently. Even after we stopped her beta blocker completely, still has slow ventricular response and we ; may need a pacemaker in the future.  2. CHF: Clinically euvolemic, NYHA functional class I-II. remains compensated even after we reduced her diuretic to 3 days a week.  3. HTN: Acceptable control. Avoid "perfect" blood pressure control since she appears to have symptoms and signs of orthostatic hypotension and falls.  4. HLP: Satisfactory lipid profile on statin. 5. Right carotid stenosis: Appears to be asymptomatic. Monitored periodically by Dr. Donnetta Hutching. 6. PAD: She has mostly infrapopliteal disease (bilateral two-vessel runoff, ABI 0.97 on right 0.90 on left, 2017). She does not report any symptoms of intermittent claudication to me today.   7. DM: Reports excellent control, on minimum therapy. She is lean. 8. Falls and early  cognitive dysfunction:  She should not drive. I worry about poor reflexes, poor coordination, episodes of dizziness and memory issues. I also worry about the implications of injuries while on anticoagulation.    Medication Adjustments/Labs and Tests Ordered: Current medicines are reviewed at length with the patient today.  Concerns regarding medicines are outlined above.  Medication changes, Labs and Tests ordered today are listed in the Patient Instructions below. Patient Instructions  Dr Sallyanne Kuster recommends that you schedule a follow-up appointment in 6 months. You will receive a reminder letter in the mail two months in advance. If you don't receive a letter, please call our office to schedule the follow-up appointment.  If you need a refill on your cardiac medications before your next appointment, please call your pharmacy.    Signed, Sanda Klein, MD  04/28/2017 1:06 PM    [facility] [address], [place], [place]  [address] Phone: [phone]; Fax: [phone]

## 2017-05-08 DIAGNOSIS — I10 Essential (primary) hypertension: Secondary | ICD-10-CM | POA: Diagnosis not present

## 2017-05-08 DIAGNOSIS — R2689 Other abnormalities of gait and mobility: Secondary | ICD-10-CM | POA: Diagnosis not present

## 2017-05-08 DIAGNOSIS — E1151 Type 2 diabetes mellitus with diabetic peripheral angiopathy without gangrene: Secondary | ICD-10-CM | POA: Diagnosis not present

## 2017-05-08 DIAGNOSIS — Z6821 Body mass index (BMI) 21.0-21.9, adult: Secondary | ICD-10-CM | POA: Diagnosis not present

## 2017-05-08 DIAGNOSIS — I6789 Other cerebrovascular disease: Secondary | ICD-10-CM | POA: Diagnosis not present

## 2017-05-08 DIAGNOSIS — G4733 Obstructive sleep apnea (adult) (pediatric): Secondary | ICD-10-CM | POA: Diagnosis not present

## 2017-05-13 ENCOUNTER — Other Ambulatory Visit: Payer: Self-pay | Admitting: Cardiovascular Disease

## 2017-05-14 ENCOUNTER — Other Ambulatory Visit: Payer: Self-pay | Admitting: Cardiovascular Disease

## 2017-05-27 ENCOUNTER — Institutional Professional Consult (permissible substitution): Payer: Medicare Other | Admitting: Neurology

## 2017-05-28 ENCOUNTER — Telehealth: Payer: Self-pay

## 2017-05-28 ENCOUNTER — Ambulatory Visit: Payer: Medicare Other | Admitting: Neurology

## 2017-05-28 ENCOUNTER — Encounter: Payer: Self-pay | Admitting: Neurology

## 2017-05-28 NOTE — Telephone Encounter (Signed)
Pt did not show her appt today or yesterday with Dr. Brett Fairy.

## 2017-06-01 ENCOUNTER — Other Ambulatory Visit: Payer: Self-pay | Admitting: Cardiovascular Disease

## 2017-06-02 ENCOUNTER — Other Ambulatory Visit: Payer: Self-pay | Admitting: Cardiovascular Disease

## 2017-06-02 MED ORDER — FUROSEMIDE 20 MG PO TABS: ORAL_TABLET | ORAL | 6 refills | Status: DC

## 2017-06-17 ENCOUNTER — Other Ambulatory Visit: Payer: Self-pay | Admitting: Cardiovascular Disease

## 2017-06-17 NOTE — Telephone Encounter (Signed)
REFILL 

## 2017-07-14 DIAGNOSIS — H9193 Unspecified hearing loss, bilateral: Secondary | ICD-10-CM | POA: Diagnosis not present

## 2017-07-14 DIAGNOSIS — H6123 Impacted cerumen, bilateral: Secondary | ICD-10-CM | POA: Diagnosis not present

## 2017-07-14 DIAGNOSIS — H60501 Unspecified acute noninfective otitis externa, right ear: Secondary | ICD-10-CM | POA: Diagnosis not present

## 2017-07-28 ENCOUNTER — Other Ambulatory Visit: Payer: Self-pay | Admitting: Cardiovascular Disease

## 2017-08-04 ENCOUNTER — Other Ambulatory Visit: Payer: Self-pay | Admitting: Cardiovascular Disease

## 2017-08-04 MED ORDER — APIXABAN 2.5 MG PO TABS: 2.5000 mg | ORAL_TABLET | Freq: Two times a day (BID) | ORAL | 1 refills | Status: DC

## 2017-08-04 NOTE — Telephone Encounter (Signed)
New Message   They increased the price for her medication  And she can not afford this medication...  She is completely out of this medication   *STAT* If patient is at the pharmacy, call can be transferred to refill team.   1. Which medications need to be refilled? (please list name of each medication and dose if known)  Eliquis   2. Which pharmacy/location (including street and city if local pharmacy) is medication to be sent to? [facility] [address] in [place]  3. Do they need a 30 day or 90 day supply? [place]

## 2017-08-04 NOTE — Telephone Encounter (Signed)
Called pharmacy to verify that they received the rx for Eliquis last week. Patient states she can not afford the 90 day supply and requested a 2 months supply be sent to [facility] on [address]. Informed her that I would send the rx over. She voiced understanding.

## 2017-08-08 DIAGNOSIS — E1151 Type 2 diabetes mellitus with diabetic peripheral angiopathy without gangrene: Secondary | ICD-10-CM | POA: Diagnosis not present

## 2017-08-08 DIAGNOSIS — M2041 Other hammer toe(s) (acquired), right foot: Secondary | ICD-10-CM | POA: Diagnosis not present

## 2017-08-08 DIAGNOSIS — M2042 Other hammer toe(s) (acquired), left foot: Secondary | ICD-10-CM | POA: Diagnosis not present

## 2017-08-08 DIAGNOSIS — L600 Ingrowing nail: Secondary | ICD-10-CM | POA: Diagnosis not present

## 2017-08-08 DIAGNOSIS — M89372 Hypertrophy of bone, left ankle and foot: Secondary | ICD-10-CM | POA: Diagnosis not present

## 2017-08-20 ENCOUNTER — Telehealth: Payer: Self-pay | Admitting: Cardiovascular Disease

## 2017-08-20 NOTE — Telephone Encounter (Signed)
° °  [facility] Pre-operative Risk Assessment    Request for surgical clearance:  What type of surgery is being performed?Tooth Extraction   1. When is this surgery scheduled? Pending    2. Are there any medications that need to be held prior to surgery and how long?Eliquis    3. Name of physician performing surgery? Dr.Mark Beattie  4. What is your office phone and fax number? [place] 08/20/2017, 4:48 PM  _________________________________________________________________   (provider comments below)

## 2017-08-21 ENCOUNTER — Encounter: Payer: Self-pay | Admitting: Cardiovascular Disease

## 2017-08-21 NOTE — Telephone Encounter (Signed)
Epicd to PCP. Dr. Cephus Slater not in [place], please fax MCr

## 2017-08-22 NOTE — Telephone Encounter (Signed)
FAXED VIA EPIC DR Amedeo Gory

## 2017-08-26 NOTE — Telephone Encounter (Signed)
Missy( Dr. Tommie Raymond office) is calling because the fax does not say whether or not the patient can be off of her Eliquis before the tooth extraction . Please call at [phone].. Thanks

## 2017-08-26 NOTE — Telephone Encounter (Signed)
Letter faxed to Dr Tommie Raymond via epic.

## 2017-09-02 DIAGNOSIS — Z23 Encounter for immunization: Secondary | ICD-10-CM | POA: Diagnosis not present

## 2017-09-15 ENCOUNTER — Ambulatory Visit (INDEPENDENT_AMBULATORY_CARE_PROVIDER_SITE_OTHER): Payer: Medicare Other | Admitting: Neurology

## 2017-09-15 ENCOUNTER — Encounter: Payer: Self-pay | Admitting: Neurology

## 2017-09-15 VITALS — BP 145/78 | HR 60 | Ht 65.0 in | Wt 135.0 lb

## 2017-09-15 DIAGNOSIS — I6523 Occlusion and stenosis of bilateral carotid arteries: Secondary | ICD-10-CM | POA: Diagnosis not present

## 2017-09-15 DIAGNOSIS — I951 Orthostatic hypotension: Secondary | ICD-10-CM | POA: Diagnosis not present

## 2017-09-15 NOTE — Progress Notes (Signed)
[id number]    Provider:  Dr Jaynee Eagles Referring Provider: Leanna Battles, MD Primary Care Physician:  Leanna Battles, MD  CC:  Dizziness   I had the pleasure of seeing Mrs. Donna Owens today on 09/15/2017, this revisit was scheduled in December 2017 and is meant to address the patient's cognitive status, she is walking with a cane, she still has a lot of dizziness. She had recent falls. In my last visit with the patient I had mentioned that I thought she was more depressed than demented. She is a breast cancer survivor, she often felt lonely and many of her friends have passed. Today's Mini-Mental Status Examination revealed 24 out of 30 points which would be cognitive impairment-mild dementia. It is interesting that the patient lost 5 points due to attention and calculation. She could do very well with backward spelling.   HPI:  Donna Owens is a 83 y.o. female here as a referral from Dr. Philip Aspen for a fall. She has an urgent add-on today for the work in doctor because Dr. Roddie Mc is out of the office. Daughter wanted patient seen. She is accompanied by daughter today. She is lightheaded. Blood pressure this morning in the office is very low 96/50. Symptoms Started 2 months ago. She denies room spinning. She gets lightheasded like she is going to fall. She feels light headed in the morning when she gets up. She takes her blood pressure in the morning and iit is a little lower about 478 or 295 systolic. The dizziness is when she stands up and gets out of bed. No dizziness any other time during the day. She was getting up to go to the bathroom and she stood up and fell and her legs got caught up on herself and she slipped and fell. She hit her head.  she hit it on the right side of the head and tripped. She is more disoriented, going on for several weeks.     Reviewed notes, labs and imaging from outside physicians, which showed: Patient was seen in the ED in April after the fall.  CT showed nothing acute (personally reviewed images). Labs at that time showed dehydration. Donna Owens is a 83 y.o. female here as a referral from Dr. Philip Aspen for a fall. She has an urgent add-on today for the work in doctor because Dr. Roddie Mc is out of the office. Daughter wanted patient seen. She is accompanied by daughter today. Patient reports 2 months of dizziness, lightheadedness, falls and recent confusion. Blood pressure this morning in the office is very low 96/50. Icalled Dr. Benn Moulder  and ask him to see the patient as soon as possible think she has returned to paroxysmal atrial fibrillation this is been explanation for both could lead to the headaches in the morning to shortness of breath to feeling dizzy and lightheaded and to her ankle edema. She has significant problems with heat and humidity tolerance.   Home physical therapy for her gait and falls, she lives alone We'll get a stat CBC, CMP and urinalysis with reflex microscopy Discussed that her blood pressures. Lower on the office today and she might be dehydrated again, this is likely the cause of her symptoms. Symptoms to sound orthostatic in nature. She needs to increase her fluid intake.She should follow-up Dr. Sharlett Iles. Daughter's cell phone number is [phone].   Interval history C Lakyia Behe. From  07-20-15 . The patient reports ongoing dizziness she was dehydrated in June been my colleague and partner Dr. Sarina Ill  evaluated her. Suffered a fall and Dr. Philip Aspen referred her here. She became an urgent "" work in Dr. patient that pressure in the office was 90/50.  The symptoms had begun 2 months earlier already in late April she denied any vertigo sensation of spinning but she gets but she confesses that she didn't like drinking water very much and he had took her blood pressure pills in the morning it seems that the medication in combination with the dehydration to her falls and dizziness. The dizziness now has  continued. All she hit the right side of the head and tripped , remained for 2 weeks  disoriented after this this fall- meanwhile , this has improved. She increased her fluid intake. She developed severe lower extremity edema. Remains on all BP medications.  Labs reviewed after last visit. Her renal clearance is reduced by 50%, her creatinine was 1.5 for BUN 33 I believe that she was mainly dehydrated but she does not have a lot of muscle mass and so the creatinine is definitely elevated. She is also anemic but claims that all her life this has been the case. The patient is of African-American and Native American descent.  ([place] - maternal grandmother).   She is also a patient of Dr. Benn Moulder . She most likely has a cardiac cause for her fluid retention in both lower extremities. I assured her that her liver function was fine this was one of her concerns. Her renal function is decreased. I like for her to take a multivitamin with a little bit of iron and assured her that she does not need a CPAP since she does not have enough apnea to justify the step . I think the recent confusion has cleared and she probably suffered a post concussion syndrome. Today's blood pressure has recovered to normal. She did have a urinary tract infection 4 weeks ago which has meanwhile been treated this also will have helped her to regain a normal cognitive status, encouraged her to speak to Dr Benn Moulder and Dr Philip Aspen.  Interval history from 01/23/2016, CD Mrs. Donna Owens is here today for a revisit. She reports that she has started to eat more take out food after she had a couple of meals burn on the stove. She states that usually the telephone rings and it distracts her but she is cooking leaving her with a mass on the stove. We discussed today that the very high salt content of many processed foods may not be to her benefit and she may consider using a meal service. I think that she would enjoy cooking if she wouldn't have to  shop for single portion preparation. Her closest family is her daughter and her daughter's children. She reports that there has been no complain about cognitive decline or memory failures. 24-30 is indicative of MCI versus mild dementia.  MMSE - Mini Mental State Exam 09/15/2017 11/27/2016 08/12/2016 01/23/2016  Orientation to time 4 5 5 4   Orientation to Place 4 5 5 4   Registration 3 3 3 3   Attention/ Calculation 4 5 2 5   Attention/Calculation-comments couldn do numbers - - -  Recall 1 0 1 1  Language- name 2 objects 2 2 2 2   Language- repeat 1 1 0 0  Language- follow 3 step command 3 2 3 3   Language- read & follow direction 1 1 1 1   Write a sentence 1 1 1 1   Copy design 0 1 0 0  Total score 24 26 23  24  Review of Systems: Patient complains of symptoms per HPI as well as the following symptoms:  Falls, repeated - has a cane but needs walker- hates the walker. Weight loss,  Depression, loneliness, fatigue, blurred vision, shortness of breath, snoring, increased thirst, incontinence, joint swelling, aching muscles, urination problems, incontinence, confusion, slurred speech, tremor, depression, decreased energy, snoring, restless legs.  Ep[place] 7,  FSS not answered.  Pertinent negatives per HPI. All others negative.   Social History   Social History  . Marital status: Divorced    Spouse name: N/A  . Number of children: 1  . Years of education: BSN   Occupational History  . RETIRED Retired   Social History Main Topics  . Smoking status: Never Smoker  . Smokeless tobacco: Never Used  . Alcohol use No  . Drug use: No  . Sexual activity: No   Other Topics Concern  . Not on file   Social History Narrative   Patient is divorced and lives alone.   Patient has one adult daughter.   Patient is retired.   Patient has a BSN degree in nursing.   Patient is right-handed.   Patient drinks one soda per week.    Family History  Problem Relation Age of Onset  . Diabetes  Brother   . Hypertension Brother   . Heart failure Daughter 93  . Diabetes Other     Past Medical History:  Diagnosis Date  . Anemia   . Atrophic vaginitis   . Blood transfusion without reported diagnosis   . Cancer ([place])   . Carotid arterial disease ([place]) 12/13/11    mild by doppler  . CVA (cerebral vascular accident) ([place])   . Diabetes mellitus   . Hyperlipemia   . Hypertension   . OSA (obstructive sleep apnea)   . OSA on CPAP 06/20/2014  . Osteoporosis   . Pulmonary hypertension ([place])    moderate echo 11/07/10  . PVD (peripheral vascular disease) ([place])   . Sleep apnea     Past Surgical History:  Procedure Laterality Date  . BREAST LUMPECTOMY Right    chemo/radiation  . BREAST LUMPECTOMY Left    benign  . BREAST SURGERY     right 07/26/2000  . CARDIOVERSION N/A 07/21/2014   Procedure: CARDIOVERSION;  Surgeon: Sanda Klein, MD;  Location: [facility];  Service: Cardiovascular;  Laterality: N/A;  . COLONOSCOPY  2010  . ingrown toenails     removed in 03/2013    Current Outpatient Prescriptions  Medication Sig Dispense Refill  . acetaminophen (TYLENOL) 500 MG tablet Take 500 mg by mouth every 6 (six) hours as needed for moderate pain.    Marland Kitchen amLODipine (NORVASC) 5 MG tablet TAKE 1 TABLET(5 MG) BY MOUTH DAILY 30 tablet 9  . apixaban (ELIQUIS) 2.5 MG TABS tablet Take 1 tablet (2.5 mg total) by mouth 2 (two) times daily. 120 tablet 1  . calcium-vitamin D (OSCAL WITH D) 500-200 MG-UNIT per tablet Take 1 tablet by mouth daily with breakfast.     . Cholecalciferol (VITAMIN D-3) 1000 UNITS CAPS Take 1 capsule by mouth daily.    . Coenzyme Q10 (CO Q 10) 100 MG CAPS Take 1 capsule by mouth daily.    . diazepam (VALIUM) 5 MG tablet Take 1 tablet (5 mg total) by mouth every 8 (eight) hours as needed (dizziness). 5 tablet 0  . econazole nitrate 1 % cream Apply topically daily. 15 g 0  . ferrous sulfate 325 (65 FE) MG tablet Take 1  tablet by mouth daily.  12  . furosemide (LASIX)  20 MG tablet TAKE 1 TABLET BY MOUTH THREE TIMES A WEEK MONDAY, WEDNESDAY AND FRIDAY 36 tablet 3  . gabapentin (NEURONTIN) 300 MG capsule Take 1 capsule (300 mg total) by mouth at bedtime. 90 capsule 11  . hydrOXYzine (ATARAX/VISTARIL) 25 MG tablet Take 1 tablet (25 mg total) by mouth 3 (three) times daily as needed for itching. 30 tablet 2  . lisinopril (PRINIVIL,ZESTRIL) 20 MG tablet TAKE 2 TABLETS BY MOUTH DAILY 180 tablet 3  . metFORMIN (GLUCOPHAGE) 500 MG tablet Take 1 tablet by mouth daily.  3  . pravastatin (PRAVACHOL) 40 MG tablet Take 40 mg by mouth daily.    . promethazine (PHENERGAN) 25 MG tablet Take 25 mg by mouth daily. nausea    . traMADol (ULTRAM) 50 MG tablet Take 1 tablet (50 mg total) by mouth every 6 (six) hours as needed. 15 tablet 0  . triamcinolone cream (KENALOG) 0.1 % Apply 1 application topically 2 (two) times daily. (Patient taking differently: Apply 1 application topically 2 (two) times daily as needed (rash/sore). On left foot.) 30 g 0  . vitamin B-12 (CYANOCOBALAMIN) 1000 MCG tablet Take 1,000 mcg by mouth 2 (two) times daily.      No current facility-administered medications for this visit.     Allergies as of 09/15/2017  . (No Known Allergies)    Vitals: BP (!) 145/78   Pulse 60   Ht 5\' 5"  (1.651 m)   Wt 135 lb (61.2 kg)   BMI 22.47 kg/m  Last Weight:  Wt Readings from Last 1 Encounters:  09/15/17 135 lb (61.2 kg)   Last Height:   Ht Readings from Last 1 Encounters:  09/15/17 5\' 5"  (1.651 m)   Physical exam:  General: The patient is awake, alert and appears not in acute distress. The patient is well groomed. Head: Normocephalic, atraumatic.  Neck is supple. Mallampati 4, neck circumference:13.5 inches  Cardiovascular:  Regular rate and rhythm, slow, without  murmurs or carotid bruit, and without distended neck veins. Respiratory: Lungs are clear to auscultation. Skin:  With evidence of lower extremity edema, not  rash Trunk:  patient has normal  posture.  Neurologic exam : The patient is awake and alert, oriented to place and time.  Memory subjective  described as intact. Her MMSE is stale for almost 2 years.  . There is a normal attention span & concentration ability.  Speech is fluent without dysarthria, dysphonia. Mood and affect are appropriate.  Cranial nerves: Pupils are equal and briskly reactive to light. Funduscopic exam without  evidence of pallor or edema.  She is status post cataract surgery . Extraocular movements  in vertical and horizontal planes intact and without nystagmus. Visual fields by finger perimetry are intact. Hearing to finger rub intact. Facial sensation intact to fine touch.  Facial motor strength is symmetric and tongue and uvula move midline.  Motor exam:  Normal tone ,muscle bulk and symmetric strength in all extremities. Patient has a strong grip.   Sensory:  Fine touch, pinprick and vibration were tested in all extremities. Vibration is decreased in both feet beginning at the ankle.  Proprioception is  Normal. Right leg vibration loss up the knee, left leg intact above ankle.  Coordination: Rapid alternating movements in the fingers/hands is tested and normal.  Finger-to-nose maneuver  with evidence of ataxia, dysmetria  but not  tremor. Gait and station: Patient walks without assistive device, climbs up to  the exam table.  Strength within normal limits. Stance is wider based , stable .  Steps are unfragmented. Romberg testing is normal- yet the patient had againseveral falls over the last 6 month ( 3 or 4) . Deep tendon reflexes: in the upper and lower extremities are attenuated, the right  patella reflex is attenuated.   Babinski maneuver response is equivocal.   Assessment:  After physical and neurologic examination, review of laboratory studies, imaging, neurophysiology testing and pre-existing records, assessment is   1) Suffered more falls, 3 this month, but BP is actually high. Refuses a  walker. Should be a walker with a seat. We had thi s discussion multiple times over the last 2 years  2) She denied any vertigo sensation of spinning , she didn't like drinking water very much and he had taken her blood pressure pills in the morning -it seems that the medication in combination with the dehydration to her falls and dizziness. She is on lasix and still refuses to drink enough.  The dizziness now has continued. Eliquis patient - Dr. Sallyanne Kuster   2) neuropathy related sensory loss and less gait stability, increased fall risk. Decreased proprioception.  NEEDS to use her WALKER ! 3) beginning dementia or MCI- stable results 24-30 , not a case for  aricept, which can influence her heart rate.     Assessment/Plan:    Needs to get out of the house, may be adult care center 2-3 times a week for companionship. She doesn't have regular contact physical with daughter children and grand children, mostly phone contact. She is alone, lonely. She no longer  drives - to my relief. She considers [facility].  Her MMSE has been stable at  24-30 points.   Larey Seat, MD   [facility] [address] [place], [place] [address]  Phone [phone] Fax [phone]  A total of 40 minutes was spent face-to-face with this patient. Mental status and gait examination.  Over half this time was spent on counseling patient on the probable orthostatic hypotension diagnosis and different diagnostic and therapeutic options available.   Larey Seat, MD

## 2017-09-15 NOTE — Patient Instructions (Signed)
Dehydration Dehydration is when there is not enough fluid or water in your body. This happens when you lose more fluids than you take in. People who are age 81 or older have a higher risk of getting dehydrated. Dehydration can range from mild to very bad. It should be treated right away to keep it from getting very bad. Symptoms of mild dehydration may include:  Thirst.  Dry lips.  Slightly dry mouth.  Dry, warm skin.  Dizziness. Symptoms of moderate dehydration may include:  Very dry mouth.  Muscle cramps.  Dark pee (urine). Pee may be the color of tea.  Your body making less pee.  Your eyes making fewer tears.  Heartbeat that is uneven or faster than normal (palpitations).  Headache.  Light-headedness, especially when you stand up from sitting.  Fainting (syncope). Symptoms of very bad dehydration may include:  Changes in skin, such as: ? Cold and clammy skin. ? Blotchy (mottled) or pale skin. ? Skin that does not quickly return to normal after being lightly pinched and let go (poor skin turgor).  Changes in body fluids, such as: ? Feeling very thirsty. ? Your eyes making fewer tears. ? Not sweating when body temperature is high, such as in hot weather. ? Your body making very little pee.  Changes in vital signs, such as: ? Weak pulse. ? Pulse that is more than 100 beats a minute when you are sitting still. ? Fast breathing. ? Low blood pressure.  Other changes, such as: ? Sunken eyes. ? Cold hands and feet. ? Confusion. ? Lack of energy (lethargy). ? Trouble waking up from sleep. ? Short-term weight loss. ? Unconsciousness. Follow these instructions at home:  If told by your doctor, drink an ORS: ? Make an ORS by using instructions on the package. ? Start by drinking small amounts, about  cup (120 mL) every 5-10 minutes. ? Slowly drink more until you have had the amount that your doctor said to have.  Drink enough clear fluid to keep your pee  clear or pale yellow. If you were told to drink an ORS, finish the ORS first, then start slowly drinking clear fluids. Drink fluids such as: ? Water. Do not drink only water by itself. Doing that can make the salt (sodium) level in your body get too low (hyponatremia). ? Ice chips. ? Fruit juice that you have added water to (diluted). ? Low-calorie sports drinks.  Avoid: ? Alcohol. ? Drinks that have a lot of sugar. These include high-calorie sports drinks, fruit juice that does not have water added, and soda. ? Caffeine. ? Foods that are greasy or have a lot of fat or sugar.  Take over-the-counter and prescription medicines only as told by your doctor.  Do not take salt tablets. Doing that can make the salt level in your body get too high (hypernatremia).  Eat foods that have minerals (electrolytes). Examples include bananas, oranges, potatoes, tomatoes, and spinach.  Keep all follow-up visits as told by your doctor. This is important. Contact a doctor if:  You have belly (abdominal) pain that: ? Gets worse. ? Stays in one area (localizes).  You have a rash.  You have a stiff neck.  You get angry or annoyed more easily than normal (irritability).  You are more sleepy than normal.  You have a harder time waking up than normal.  You feel: ? Weak. ? Dizzy. ? Very thirsty. Get help right away if:  You have symptoms of very bad dehydration.    You cannot drink fluids without throwing up (vomiting).  Your symptoms get worse with treatment.  You have a fever.  You have a very bad headache.  You are throwing up or having watery poop (diarrhea) and it: ? Gets worse. ? Does not go away.  You have diarrhea for more than 24 hours.  You have blood or something green (bile) in your throw-up.  You have blood in your poop (stool). This may cause poop to look black and tarry.  You have not peed in 6-8 hours.  You have peed (urinated) only a small amount of very dark pee  during 6-8 hours.  You pass out (faint).  Your heart rate when you are sitting still is more than 100 beats a minute.  You have trouble breathing. This information is not intended to replace advice given to you by your health care provider. Make sure you discuss any questions you have with your health care provider. Document Released: 11/28/2011 Document Revised: 06/28/2016 Document Reviewed: 02/02/2016 Elsevier Interactive Patient Education  2018 [employer]  

## 2017-09-16 ENCOUNTER — Ambulatory Visit (INDEPENDENT_AMBULATORY_CARE_PROVIDER_SITE_OTHER): Payer: Medicare Other | Admitting: Ophthalmology

## 2017-09-16 DIAGNOSIS — H43813 Vitreous degeneration, bilateral: Secondary | ICD-10-CM | POA: Diagnosis not present

## 2017-09-16 DIAGNOSIS — E11319 Type 2 diabetes mellitus with unspecified diabetic retinopathy without macular edema: Secondary | ICD-10-CM | POA: Diagnosis not present

## 2017-09-16 DIAGNOSIS — H35033 Hypertensive retinopathy, bilateral: Secondary | ICD-10-CM

## 2017-09-16 DIAGNOSIS — E113393 Type 2 diabetes mellitus with moderate nonproliferative diabetic retinopathy without macular edema, bilateral: Secondary | ICD-10-CM

## 2017-09-16 DIAGNOSIS — I10 Essential (primary) hypertension: Secondary | ICD-10-CM

## 2017-10-09 DIAGNOSIS — H401131 Primary open-angle glaucoma, bilateral, mild stage: Secondary | ICD-10-CM | POA: Diagnosis not present

## 2017-10-09 DIAGNOSIS — Z961 Presence of intraocular lens: Secondary | ICD-10-CM | POA: Diagnosis not present

## 2017-10-09 DIAGNOSIS — H04123 Dry eye syndrome of bilateral lacrimal glands: Secondary | ICD-10-CM | POA: Diagnosis not present

## 2017-10-09 DIAGNOSIS — H532 Diplopia: Secondary | ICD-10-CM | POA: Diagnosis not present

## 2017-10-09 DIAGNOSIS — H10413 Chronic giant papillary conjunctivitis, bilateral: Secondary | ICD-10-CM | POA: Diagnosis not present

## 2017-10-12 ENCOUNTER — Emergency Department (HOSPITAL_COMMUNITY): Payer: Medicare Other

## 2017-10-12 ENCOUNTER — Observation Stay (HOSPITAL_COMMUNITY): Payer: Medicare Other

## 2017-10-12 ENCOUNTER — Encounter (HOSPITAL_COMMUNITY): Payer: Self-pay | Admitting: Emergency Medicine

## 2017-10-12 ENCOUNTER — Inpatient Hospital Stay (HOSPITAL_COMMUNITY)
Admission: EM | Admit: 2017-10-12 | Discharge: 2017-10-17 | DRG: 565 | Disposition: A | Discharge status: Skilled Nursing Facility | Payer: Medicare Other | Attending: Internal Medicine | Admitting: Internal Medicine

## 2017-10-12 DIAGNOSIS — S8992XA Unspecified injury of left lower leg, initial encounter: Secondary | ICD-10-CM | POA: Diagnosis not present

## 2017-10-12 DIAGNOSIS — T796XXA Traumatic ischemia of muscle, initial encounter: Secondary | ICD-10-CM | POA: Diagnosis not present

## 2017-10-12 DIAGNOSIS — R4020 Unspecified coma: Secondary | ICD-10-CM | POA: Diagnosis not present

## 2017-10-12 DIAGNOSIS — I10 Essential (primary) hypertension: Secondary | ICD-10-CM | POA: Diagnosis present

## 2017-10-12 DIAGNOSIS — R778 Other specified abnormalities of plasma proteins: Secondary | ICD-10-CM | POA: Diagnosis present

## 2017-10-12 DIAGNOSIS — Z7901 Long term (current) use of anticoagulants: Secondary | ICD-10-CM

## 2017-10-12 DIAGNOSIS — Y92009 Unspecified place in unspecified non-institutional (private) residence as the place of occurrence of the external cause: Secondary | ICD-10-CM

## 2017-10-12 DIAGNOSIS — R748 Abnormal levels of other serum enzymes: Secondary | ICD-10-CM

## 2017-10-12 DIAGNOSIS — R339 Retention of urine, unspecified: Secondary | ICD-10-CM | POA: Diagnosis present

## 2017-10-12 DIAGNOSIS — N390 Urinary tract infection, site not specified: Secondary | ICD-10-CM | POA: Diagnosis not present

## 2017-10-12 DIAGNOSIS — G4733 Obstructive sleep apnea (adult) (pediatric): Secondary | ICD-10-CM | POA: Diagnosis present

## 2017-10-12 DIAGNOSIS — M545 Low back pain: Secondary | ICD-10-CM

## 2017-10-12 DIAGNOSIS — I5032 Chronic diastolic (congestive) heart failure: Secondary | ICD-10-CM | POA: Diagnosis present

## 2017-10-12 DIAGNOSIS — G9340 Encephalopathy, unspecified: Secondary | ICD-10-CM | POA: Diagnosis present

## 2017-10-12 DIAGNOSIS — M549 Dorsalgia, unspecified: Secondary | ICD-10-CM | POA: Diagnosis not present

## 2017-10-12 DIAGNOSIS — G3184 Mild cognitive impairment, so stated: Secondary | ICD-10-CM | POA: Diagnosis present

## 2017-10-12 DIAGNOSIS — M810 Age-related osteoporosis without current pathological fracture: Secondary | ICD-10-CM | POA: Diagnosis present

## 2017-10-12 DIAGNOSIS — S3992XA Unspecified injury of lower back, initial encounter: Secondary | ICD-10-CM | POA: Diagnosis not present

## 2017-10-12 DIAGNOSIS — M5127 Other intervertebral disc displacement, lumbosacral region: Secondary | ICD-10-CM | POA: Diagnosis present

## 2017-10-12 DIAGNOSIS — S79912A Unspecified injury of left hip, initial encounter: Secondary | ICD-10-CM | POA: Diagnosis not present

## 2017-10-12 DIAGNOSIS — Z7984 Long term (current) use of oral hypoglycemic drugs: Secondary | ICD-10-CM

## 2017-10-12 DIAGNOSIS — R410 Disorientation, unspecified: Secondary | ICD-10-CM | POA: Diagnosis not present

## 2017-10-12 DIAGNOSIS — Z833 Family history of diabetes mellitus: Secondary | ICD-10-CM

## 2017-10-12 DIAGNOSIS — S199XXA Unspecified injury of neck, initial encounter: Secondary | ICD-10-CM | POA: Diagnosis not present

## 2017-10-12 DIAGNOSIS — M5489 Other dorsalgia: Secondary | ICD-10-CM | POA: Diagnosis not present

## 2017-10-12 DIAGNOSIS — E1151 Type 2 diabetes mellitus with diabetic peripheral angiopathy without gangrene: Secondary | ICD-10-CM | POA: Diagnosis not present

## 2017-10-12 DIAGNOSIS — W19XXXA Unspecified fall, initial encounter: Secondary | ICD-10-CM | POA: Diagnosis present

## 2017-10-12 DIAGNOSIS — M6282 Rhabdomyolysis: Secondary | ICD-10-CM | POA: Diagnosis not present

## 2017-10-12 DIAGNOSIS — I110 Hypertensive heart disease with heart failure: Secondary | ICD-10-CM | POA: Diagnosis present

## 2017-10-12 DIAGNOSIS — S299XXA Unspecified injury of thorax, initial encounter: Secondary | ICD-10-CM | POA: Diagnosis not present

## 2017-10-12 DIAGNOSIS — S0003XA Contusion of scalp, initial encounter: Secondary | ICD-10-CM | POA: Diagnosis present

## 2017-10-12 DIAGNOSIS — R7989 Other specified abnormal findings of blood chemistry: Secondary | ICD-10-CM | POA: Diagnosis present

## 2017-10-12 DIAGNOSIS — Z8673 Personal history of transient ischemic attack (TIA), and cerebral infarction without residual deficits: Secondary | ICD-10-CM

## 2017-10-12 DIAGNOSIS — M546 Pain in thoracic spine: Secondary | ICD-10-CM | POA: Diagnosis not present

## 2017-10-12 DIAGNOSIS — I272 Pulmonary hypertension, unspecified: Secondary | ICD-10-CM | POA: Diagnosis present

## 2017-10-12 DIAGNOSIS — W1830XA Fall on same level, unspecified, initial encounter: Secondary | ICD-10-CM | POA: Diagnosis present

## 2017-10-12 DIAGNOSIS — R338 Other retention of urine: Secondary | ICD-10-CM | POA: Diagnosis not present

## 2017-10-12 DIAGNOSIS — M4326 Fusion of spine, lumbar region: Secondary | ICD-10-CM | POA: Diagnosis present

## 2017-10-12 DIAGNOSIS — I4891 Unspecified atrial fibrillation: Secondary | ICD-10-CM | POA: Diagnosis not present

## 2017-10-12 DIAGNOSIS — S0990XA Unspecified injury of head, initial encounter: Secondary | ICD-10-CM | POA: Diagnosis not present

## 2017-10-12 DIAGNOSIS — M25562 Pain in left knee: Secondary | ICD-10-CM | POA: Diagnosis not present

## 2017-10-12 DIAGNOSIS — E119 Type 2 diabetes mellitus without complications: Secondary | ICD-10-CM

## 2017-10-12 DIAGNOSIS — Z8249 Family history of ischemic heart disease and other diseases of the circulatory system: Secondary | ICD-10-CM

## 2017-10-12 DIAGNOSIS — E785 Hyperlipidemia, unspecified: Secondary | ICD-10-CM | POA: Diagnosis present

## 2017-10-12 DIAGNOSIS — T148XXA Other injury of unspecified body region, initial encounter: Secondary | ICD-10-CM | POA: Diagnosis not present

## 2017-10-12 DIAGNOSIS — L8990 Pressure ulcer of unspecified site, unspecified stage: Secondary | ICD-10-CM | POA: Insufficient documentation

## 2017-10-12 DIAGNOSIS — L89152 Pressure ulcer of sacral region, stage 2: Secondary | ICD-10-CM | POA: Diagnosis present

## 2017-10-12 HISTORY — DX: Malignant neoplasm of unspecified site of right female breast: C50.911

## 2017-10-12 HISTORY — DX: Type 2 diabetes mellitus without complications: E11.9

## 2017-10-12 LAB — URINALYSIS, ROUTINE W REFLEX MICROSCOPIC
BILIRUBIN URINE: NEGATIVE
Bacteria, UA: NONE SEEN
Glucose, UA: NEGATIVE mg/dL
Ketones, ur: 5 mg/dL — AB
Leukocytes, UA: NEGATIVE
Nitrite: NEGATIVE
Protein, ur: 100 mg/dL — AB
Specific Gravity, Urine: 1.017 (ref 1.005–1.030)
pH: 5 (ref 5.0–8.0)

## 2017-10-12 LAB — COMPREHENSIVE METABOLIC PANEL
ALBUMIN: 3.7 g/dL (ref 3.5–5.0)
ALT: 47 U/L (ref 14–54)
AST: 117 U/L — AB (ref 15–41)
Alkaline Phosphatase: 101 U/L (ref 38–126)
Anion gap: 10 (ref 5–15)
BILIRUBIN TOTAL: 1.4 mg/dL — AB (ref 0.3–1.2)
BUN: 35 mg/dL — AB (ref 6–20)
CHLORIDE: 103 mmol/L (ref 101–111)
CO2: 22 mmol/L (ref 22–32)
Calcium: 9.5 mg/dL (ref 8.9–10.3)
Creatinine, Ser: 1.33 mg/dL — ABNORMAL HIGH (ref 0.44–1.00)
GFR calc Af Amer: 40 mL/min — ABNORMAL LOW (ref >60)
GFR calc non Af Amer: 35 mL/min — ABNORMAL LOW (ref >60)
GLUCOSE: 153 mg/dL — AB (ref 65–99)
POTASSIUM: 4 mmol/L (ref 3.5–5.1)
Sodium: 135 mmol/L (ref 135–145)
Total Protein: 8.5 g/dL — ABNORMAL HIGH (ref 6.5–8.1)

## 2017-10-12 LAB — CBC WITH DIFFERENTIAL/PLATELET
BASOS ABS: 0 10*3/uL (ref 0.0–0.1)
BASOS PCT: 0 %
Eosinophils Absolute: 0 10*3/uL (ref 0.0–0.7)
Eosinophils Relative: 0 %
HEMATOCRIT: 43.6 % (ref 36.0–46.0)
Hemoglobin: 15.3 g/dL — ABNORMAL HIGH (ref 12.0–15.0)
LYMPHS PCT: 4 %
Lymphs Abs: 0.6 10*3/uL — ABNORMAL LOW (ref 0.7–4.0)
MCH: 30.6 pg (ref 26.0–34.0)
MCHC: 35.1 g/dL (ref 30.0–36.0)
MCV: 87.2 fL (ref 78.0–100.0)
MONO ABS: 1.6 10*3/uL — AB (ref 0.1–1.0)
Monocytes Relative: 10 %
NEUTROS ABS: 14 10*3/uL — AB (ref 1.7–7.7)
NEUTROS PCT: 86 %
Platelets: 199 10*3/uL (ref 150–400)
RBC: 5 MIL/uL (ref 3.87–5.11)
RDW: 13.8 % (ref 11.5–15.5)
WBC: 16.2 10*3/uL — AB (ref 4.0–10.5)

## 2017-10-12 LAB — CK: CK TOTAL: 3668 U/L — AB (ref 38–234)

## 2017-10-12 LAB — TROPONIN I
TROPONIN I: 0.3 ng/mL — AB (ref <0.03)
Troponin I: 0.25 ng/mL (ref <0.03)

## 2017-10-12 LAB — CBG MONITORING, ED: GLUCOSE-CAPILLARY: 126 mg/dL — AB (ref 65–99)

## 2017-10-12 MED ORDER — VITAMIN D 1000 UNITS PO TABS
1000.0000 [IU] | ORAL_TABLET | Freq: Every day | ORAL | Status: DC
  Administered 2017-10-13 – 2017-10-17 (×5): 1000 [IU] via ORAL
  Filled 2017-10-12 (×5): qty 1

## 2017-10-12 MED ORDER — TROLAMINE SALICYLATE 10 % EX CREA: 1.0000 "application " | TOPICAL_CREAM | CUTANEOUS | Status: DC | PRN

## 2017-10-12 MED ORDER — IRBESARTAN 300 MG PO TABS
150.0000 mg | ORAL_TABLET | Freq: Every day | ORAL | Status: DC
  Administered 2017-10-12 – 2017-10-17 (×6): 150 mg via ORAL
  Filled 2017-10-12 (×7): qty 1

## 2017-10-12 MED ORDER — ONDANSETRON HCL 4 MG/2ML IJ SOLN: 4.0000 mg | Freq: Four times a day (QID) | INTRAMUSCULAR | Status: DC | PRN

## 2017-10-12 MED ORDER — POLYETHYL GLYCOL-PROPYL GLYCOL 0.4-0.3 % OP SOLN: 1.0000 [drp] | Freq: Three times a day (TID) | OPHTHALMIC | Status: DC | PRN

## 2017-10-12 MED ORDER — VITAMIN B-12 1000 MCG PO TABS
1000.0000 ug | ORAL_TABLET | Freq: Two times a day (BID) | ORAL | Status: DC
  Administered 2017-10-12 – 2017-10-17 (×10): 1000 ug via ORAL
  Filled 2017-10-12 (×10): qty 1

## 2017-10-12 MED ORDER — POLYVINYL ALCOHOL 1.4 % OP SOLN
1.0000 [drp] | OPHTHALMIC | Status: DC | PRN
  Filled 2017-10-12: qty 15

## 2017-10-12 MED ORDER — SODIUM CHLORIDE 0.9 % IV SOLN
INTRAVENOUS | Status: DC
  Administered 2017-10-12 – 2017-10-13 (×3): via INTRAVENOUS

## 2017-10-12 MED ORDER — PRAVASTATIN SODIUM 40 MG PO TABS
40.0000 mg | ORAL_TABLET | Freq: Every day | ORAL | Status: DC
  Administered 2017-10-12 – 2017-10-16 (×5): 40 mg via ORAL
  Filled 2017-10-12 (×5): qty 1

## 2017-10-12 MED ORDER — ACETAMINOPHEN 500 MG PO TABS
500.0000 mg | ORAL_TABLET | Freq: Four times a day (QID) | ORAL | Status: DC | PRN
  Administered 2017-10-12 – 2017-10-14 (×4): 500 mg via ORAL
  Filled 2017-10-12 (×4): qty 1

## 2017-10-12 MED ORDER — FENTANYL CITRATE (PF) 100 MCG/2ML IJ SOLN
50.0000 ug | Freq: Once | INTRAMUSCULAR | Status: AC
  Administered 2017-10-12: 50 ug via INTRAVENOUS
  Filled 2017-10-12: qty 2

## 2017-10-12 MED ORDER — INSULIN ASPART 100 UNIT/ML ~~LOC~~ SOLN
0.0000 [IU] | Freq: Three times a day (TID) | SUBCUTANEOUS | Status: DC
  Administered 2017-10-13: 2 [IU] via SUBCUTANEOUS
  Administered 2017-10-14 (×3): 1 [IU] via SUBCUTANEOUS
  Administered 2017-10-15: 2 [IU] via SUBCUTANEOUS
  Administered 2017-10-16 (×3): 1 [IU] via SUBCUTANEOUS

## 2017-10-12 MED ORDER — SODIUM CHLORIDE 0.9 % IV SOLN
Freq: Once | INTRAVENOUS | Status: AC
  Administered 2017-10-12: 18:00:00 via INTRAVENOUS

## 2017-10-12 MED ORDER — FERROUS SULFATE 325 (65 FE) MG PO TABS
325.0000 mg | ORAL_TABLET | Freq: Every day | ORAL | Status: DC
  Administered 2017-10-13 – 2017-10-17 (×5): 325 mg via ORAL
  Filled 2017-10-12 (×5): qty 1

## 2017-10-12 MED ORDER — APIXABAN 2.5 MG PO TABS
2.5000 mg | ORAL_TABLET | Freq: Two times a day (BID) | ORAL | Status: DC
Start: 2017-10-13 — End: 2017-10-17
  Administered 2017-10-13 – 2017-10-17 (×9): 2.5 mg via ORAL
  Filled 2017-10-12 (×9): qty 1

## 2017-10-12 MED ORDER — HYDRALAZINE HCL 20 MG/ML IJ SOLN
10.0000 mg | Freq: Once | INTRAMUSCULAR | Status: AC
  Administered 2017-10-12: 10 mg via INTRAVENOUS
  Filled 2017-10-12: qty 1

## 2017-10-12 MED ORDER — SODIUM CHLORIDE 0.9 % IV BOLUS (SEPSIS)
500.0000 mL | Freq: Once | INTRAVENOUS | Status: AC
  Administered 2017-10-12: 500 mL via INTRAVENOUS

## 2017-10-12 MED ORDER — ONDANSETRON HCL 4 MG PO TABS: 4.0000 mg | ORAL_TABLET | Freq: Four times a day (QID) | ORAL | Status: DC | PRN

## 2017-10-12 MED ORDER — GABAPENTIN 100 MG PO CAPS
100.0000 mg | ORAL_CAPSULE | Freq: Every day | ORAL | Status: DC
  Administered 2017-10-12 – 2017-10-16 (×5): 100 mg via ORAL
  Filled 2017-10-12 (×5): qty 1

## 2017-10-12 MED ORDER — HYDROXYZINE HCL 25 MG PO TABS: 25.0000 mg | ORAL_TABLET | Freq: Three times a day (TID) | ORAL | Status: DC | PRN

## 2017-10-12 MED ORDER — AMLODIPINE BESYLATE 5 MG PO TABS
5.0000 mg | ORAL_TABLET | ORAL | Status: AC
  Administered 2017-10-12: 5 mg via ORAL
  Filled 2017-10-12: qty 1

## 2017-10-12 MED ORDER — APIXABAN 2.5 MG PO TABS: 2.5000 mg | ORAL_TABLET | Freq: Two times a day (BID) | ORAL | Status: DC

## 2017-10-12 MED ORDER — CALCIUM CARBONATE-VITAMIN D 500-200 MG-UNIT PO TABS
1.0000 | ORAL_TABLET | Freq: Every day | ORAL | Status: DC
  Administered 2017-10-13 – 2017-10-17 (×5): 1 via ORAL
  Filled 2017-10-12 (×5): qty 1

## 2017-10-12 NOTE — ED Notes (Signed)
Patient transported to X-ray 

## 2017-10-12 NOTE — ED Notes (Signed)
Attempted to call report

## 2017-10-12 NOTE — ED Provider Notes (Signed)
83 yo F with a chief complaint of a fall. She is unsure exactly what happened. She does remember being stuck on the ground for long period of time. She hurts in her back where she is been laying. She was found by her daughter today. She is slightly more confused than her baseline. Workup done by Dr. Tamera Punt consistent with rhabdomyolysis. + trop as well, though no cp or sob. Started patient on a fluid infusion. Discussed with the hospitalist. Admit.      Deno Etienne, DO 10/13/17 [place]

## 2017-10-12 NOTE — H&P (Signed)
History and Physical    [name] TFT:[id number] DOB: [birth year] DOA: 10/12/2017  PCP: Leanna Battles, MD  Patient coming from: Home  I have personally briefly reviewed patient's old medical records in [facility]  Chief Complaint: Fall  HPI: Donna Owens is a 83 y.o. female with medical history significant of A.Fib, CVA, DM2, HTN.  Patient presents to the ED after a fall at home.  LKW 630pm last night per daughter.  Patient didn't answer phone this AM, daughter went to check on her found patient on floor.  Several things in house knocked over.  Patient doesn't have any recollection of events.  Complaining of pain to low back and L leg.   ED Course: CT head, neck, L spine neg, X rays of L knee, L hip and pelvis, CXR all neg other than some questionable constipation (patient then has large BM in ED), and bladder distention (patient does appear to have urinary retention).  Does have small hematoma to scalp.  Trop 0.30 (not clear why this was obtained).  CPK 3.6k.  Creat 1.33   Review of Systems: As per HPI otherwise 10 point review of systems negative.   Past Medical History:  Diagnosis Date  . Anemia   . Atrophic vaginitis   . Blood transfusion without reported diagnosis   . Cancer ([place])   . Carotid arterial disease ([place]) 12/13/11    mild by doppler  . CVA (cerebral vascular accident) ([place])   . Diabetes mellitus   . Hyperlipemia   . Hypertension   . OSA (obstructive sleep apnea)   . OSA on CPAP 06/20/2014  . Osteoporosis   . Pulmonary hypertension ([place])    moderate echo 11/07/10  . PVD (peripheral vascular disease) ([place])   . Sleep apnea     Past Surgical History:  Procedure Laterality Date  . BREAST LUMPECTOMY Right    chemo/radiation  . BREAST LUMPECTOMY Left    benign  . BREAST SURGERY     right 07/26/2000  . CARDIOVERSION N/A 07/21/2014   Procedure: CARDIOVERSION;  Surgeon: Sanda Klein, MD;  Location: [facility];  Service: Cardiovascular;   Laterality: N/A;  . COLONOSCOPY  2010  . ingrown toenails     removed in 03/2013     reports that she has never smoked. She has never used smokeless tobacco. She reports that she does not drink alcohol or use drugs.  No Known Allergies  Family History  Problem Relation Age of Onset  . Diabetes Brother   . Hypertension Brother   . Heart failure Daughter 75  . Diabetes Other      Prior to Admission medications   Medication Sig Start Date End Date Taking? Authorizing Provider  acetaminophen (TYLENOL) 500 MG tablet Take 500 mg by mouth every 6 (six) hours as needed for moderate pain.   Yes [provider]  ferrous sulfate 325 (65 FE) MG tablet Take 1 tablet by mouth daily. 02/16/16  Yes [provider]  furosemide (LASIX) 20 MG tablet TAKE 1 TABLET BY MOUTH THREE TIMES A WEEK MONDAY, WEDNESDAY AND FRIDAY 06/02/17  Yes Croitoru, Mihai, MD  gabapentin (NEURONTIN) 300 MG capsule Take 1 capsule (300 mg total) by mouth at bedtime. Patient taking differently: Take 100 mg by mouth.  01/23/16  Yes Dohmeier, Asencion Partridge, MD  hydrOXYzine (ATARAX/VISTARIL) 25 MG tablet Take 1 tablet (25 mg total) by mouth 3 (three) times daily as needed for itching. 07/20/15  Yes Dohmeier, Asencion Partridge, MD  Polyethyl Glycol-Propyl Glycol (  SYSTANE) 0.4-0.3 % SOLN Apply 1 drop to eye every 8 (eight) hours as needed (dry eyes).   Yes [provider]  triamcinolone cream (KENALOG) 0.1 % Apply 1 application topically 2 (two) times daily. Patient taking differently: Apply 1 application topically 2 (two) times daily as needed (rash/sore). On left foot. 01/12/15  Yes Trula Slade, DPM  trolamine salicylate (ASPERCREME) 10 % cream Apply 1 application topically as needed for muscle pain.   Yes [provider]  vitamin B-12 (CYANOCOBALAMIN) 1000 MCG tablet Take 1,000 mcg by mouth 2 (two) times daily.    Yes [provider]  amLODipine (NORVASC) 5 MG tablet TAKE 1 TABLET(5 MG) BY MOUTH DAILY  06/17/17   Croitoru, Dani Gobble, MD  apixaban (ELIQUIS) 2.5 MG TABS tablet Take 1 tablet (2.5 mg total) by mouth 2 (two) times daily. 08/04/17   Croitoru, Mihai, MD  calcium-vitamin D (OSCAL WITH D) 500-200 MG-UNIT per tablet Take 1 tablet by mouth daily with breakfast.     [provider]  Cholecalciferol (VITAMIN D-3) 1000 UNITS CAPS Take 1 capsule by mouth daily.    [provider]  Coenzyme Q10 (CO Q 10) 100 MG CAPS Take 1 capsule by mouth daily.    [provider]  metFORMIN (GLUCOPHAGE) 500 MG tablet Take 1 tablet by mouth daily. 04/08/16   [provider]  olmesartan (BENICAR) 20 MG tablet Take 20 tablets by mouth daily. 09/24/17   [provider]  pravastatin (PRAVACHOL) 40 MG tablet Take 40 mg by mouth daily.    [provider]  promethazine (PHENERGAN) 25 MG tablet Take 25 mg by mouth daily. nausea    [provider]    Physical Exam: Vitals:   10/12/17 1700 10/12/17 1800 10/12/17 1930 10/12/17 1934  BP: (!) 125/99  (!) 186/77 (!) 186/77  Pulse: 67 89 79   Resp: (!) 30 (!) 29 (!) 22   Temp:      TempSrc:      SpO2: 100% 100% 100%     Constitutional: NAD, calm, comfortable Eyes: PERRL, lids and conjunctivae normal ENMT: Mucous membranes are moist. Posterior pharynx clear of any exudate or lesions.Normal dentition.  Neck: normal, supple, no masses, no thyromegaly Respiratory: clear to auscultation bilaterally, no wheezing, no crackles. Normal respiratory effort. No accessory muscle use.  Cardiovascular: Regular rate and rhythm, no murmurs / rubs / gallops. No extremity edema. 2+ pedal pulses. No carotid bruits.  Abdomen: no tenderness, no masses palpated. No hepatosplenomegaly. Bowel sounds positive.  Musculoskeletal: no clubbing / cyanosis. No joint deformity upper and lower extremities. Good ROM, no contractures. Normal muscle tone.  Skin: no rashes, lesions, ulcers. No induration Neurologic: CN 2-12 grossly intact.  Sensation intact, DTR normal. Strength 5/5 in all 4.  Psychiatric: Normal judgment and insight. Alert and oriented x 3. Normal mood.    Labs on Admission: I have personally reviewed following labs and imaging studies  CBC:  Recent Labs Lab 10/12/17 1446  WBC 16.2*  NEUTROABS 14.0*  HGB 15.3*  HCT 43.6  MCV 87.2  PLT 409   Basic Metabolic Panel:  Recent Labs Lab 10/12/17 1446  NA 135  K 4.0  CL 103  CO2 22  GLUCOSE 153*  BUN 35*  CREATININE 1.33*  CALCIUM 9.5   GFR: CrCl cannot be calculated (Unknown ideal weight.). Liver Function Tests:  Recent Labs Lab 10/12/17 1446  AST 117*  ALT 47  ALKPHOS 101  BILITOT 1.4*  PROT 8.5*  ALBUMIN 3.7  No results for input(s): LIPASE, AMYLASE in the last 168 hours. No results for input(s): AMMONIA in the last 168 hours. Coagulation Profile: No results for input(s): INR, PROTIME in the last 168 hours. Cardiac Enzymes:  Recent Labs Lab 10/12/17 1446  CKTOTAL 3,668*  TROPONINI 0.30*   BNP (last 3 results) No results for input(s): PROBNP in the last 8760 hours. HbA1C: No results for input(s): HGBA1C in the last 72 hours. CBG:  Recent Labs Lab 10/12/17 1334  GLUCAP 126*   Lipid Profile: No results for input(s): CHOL, HDL, LDLCALC, TRIG, CHOLHDL, LDLDIRECT in the last 72 hours. Thyroid Function Tests: No results for input(s): TSH, T4TOTAL, FREET4, T3FREE, THYROIDAB in the last 72 hours. Anemia Panel: No results for input(s): VITAMINB12, FOLATE, FERRITIN, TIBC, IRON, RETICCTPCT in the last 72 hours. Urine analysis:    Component Value Date/Time   COLORURINE YELLOW 10/12/2017 1650   APPEARANCEUR CLEAR 10/12/2017 1650   APPEARANCEUR Slightly cloudy 06/21/2015 1358   LABSPEC 1.017 10/12/2017 1650   PHURINE 5.0 10/12/2017 1650   GLUCOSEU NEGATIVE 10/12/2017 1650   HGBUR LARGE (A) 10/12/2017 1650   BILIRUBINUR NEGATIVE 10/12/2017 1650   BILIRUBINUR Negative 06/21/2015 1358   KETONESUR 5 (A) 10/12/2017 1650    PROTEINUR 100 (A) 10/12/2017 1650   UROBILINOGEN 0.2 04/18/2015 1330   NITRITE NEGATIVE 10/12/2017 1650   LEUKOCYTESUR NEGATIVE 10/12/2017 1650   LEUKOCYTESUR 2+ (A) 06/21/2015 1358    Radiological Exams on Admission: Dg Chest 1 View  Result Date: 10/12/2017 CLINICAL DATA:  Fall.  Confusion. EXAM: CHEST 1 VIEW COMPARISON:  04/19/2013 FINDINGS: Surgical clips about the right breast and axilla. Midline trachea. Moderate cardiomegaly. Atherosclerosis in the transverse aorta. No pleural effusion or pneumothorax. No congestive failure. Clear lungs. IMPRESSION: Cardiomegaly, without congestive failure. Aortic Atherosclerosis (ICD10-I70.0). Electronically Signed   By: Abigail Miyamoto M.D.   On: 10/12/2017 14:32   Dg Lumbar Spine Complete  Result Date: 10/12/2017 CLINICAL DATA:  Fall.  Confusion. EXAM: LUMBAR SPINE - COMPLETE 4+ VIEW COMPARISON:  08/03/2012 FINDINGS: Extensive overlying bowel gas, degrading the frontal radiograph. Sacroiliac joints are grossly symmetric. Large amount of stool in the rectum. Abdominal aortic atherosclerosis. The lateral views are suboptimal secondary to technique. The elbow overlies the upper lumbar spine. No gross vertebral body height loss. Straightening of expected lordosis. Spondylosis is age expected. IMPRESSION: Moderately degraded exam secondary to positioning. No gross acute osseous abnormality identified. Nonspecific straightening of expected lordosis. Aortic Atherosclerosis (ICD10-I70.0). Possible constipation. Electronically Signed   By: Abigail Miyamoto M.D.   On: 10/12/2017 14:34   Ct Head Wo Contrast  Result Date: 10/12/2017 CLINICAL DATA:  Altered level of consciousness. Patient presents to the ED from home with complaints of fall. Patient reports trip over plant. Patient reports has been on the floor all night until daughter found her this morning. EXAM: CT HEAD WITHOUT CONTRAST TECHNIQUE: Contiguous axial images were obtained from the base of the skull through  the vertex without intravenous contrast. COMPARISON:  06/19/2016 FINDINGS: Brain: No evidence of acute infarction, hemorrhage, extra-axial collection, ventriculomegaly, or mass effect. Generalized cerebral atrophy. Periventricular white matter low attenuation likely secondary to microangiopathy. Vascular: Cerebrovascular atherosclerotic calcifications are noted. Skull: Negative for fracture or focal lesion. Sinuses/Orbits: Visualized portions of the orbits are unremarkable. Mastoid sinuses are clear. Small air-fluid level in the left maxillary sinus. Other: None. IMPRESSION: 1. No acute intracranial pathology. 2. Chronic microvascular disease and cerebral atrophy. Electronically Signed   By: Kathreen Devoid   On: 10/12/2017 16:11  Ct Cervical Spine Wo Contrast  Result Date: 10/12/2017 CLINICAL DATA:  Cervical spine trauma, high clinical risk EXAM: CT CERVICAL SPINE WITHOUT CONTRAST TECHNIQUE: Multidetector CT imaging of the cervical spine was performed without intravenous contrast. Multiplanar CT image reconstructions were also generated. COMPARISON:  06/19/2016 FINDINGS: Alignment: Mild anterolisthesis C4-5 and C5-6 Skull base and vertebrae: Negative for fracture Soft tissues and spinal canal: Extensive atherosclerotic calcification the carotid artery. Bilateral thyroid nodules. Negative for mass or adenopathy Disc levels: Extensive facet degeneration throughout the cervical spine bilaterally, right greater than left. Disc degeneration and uncinate spurring C5-6 and C6-7 with mild spinal stenosis. Upper chest: Negative Other: None IMPRESSION: Negative for cervical spine fracture. Moderate degenerative changes in the cervical spine. Electronically Signed   By: Franchot Gallo M.D.   On: 10/12/2017 18:55   Ct Lumbar Spine Wo Contrast  Result Date: 10/12/2017 CLINICAL DATA:  Initial evaluation for acute back pain, fall. EXAM: CT LUMBAR SPINE WITHOUT CONTRAST TECHNIQUE: Multidetector CT imaging of the lumbar  spine was performed without intravenous contrast administration. Multiplanar CT image reconstructions were also generated. COMPARISON:  Comparison made with prior radiograph from earlier the same day. FINDINGS: Segmentation: Normal segmentation. Lowest well-formed disc labeled the L5-S1 level. Alignment: Straightening of the normal lumbar lordosis with trace dextroscoliosis. No listhesis. Vertebrae: Vertebral body heights are maintained. No evidence for acute or chronic fracture. No discrete lytic or blastic osseous lesions. Partially visualized sacrum and pelvis are intact. SI joints approximated and symmetric. L3, L4, and L5 vertebral bodies are partially ankylosed. Paraspinal and other soft tissues: Paraspinous soft tissues demonstrate no acute abnormality. Visualized lungs are clear. Cardiomegaly partially visualized. Extensive aortic atherosclerosis. Visualized urinary bladder is markedly distended. Disc levels: L1-2: Mild circumferential disc bulge with intervertebral disc space narrowing. Moderate facet and ligamentum flavum hypertrophy. No significant stenosis. L2-3: Mild diffuse disc bulge with intervertebral disc space narrowing and disc desiccation. Moderate facet and ligamentum flavum hypertrophy. No significant stenosis. L3-4: Ankylosis of the L3 and L4 vertebral bodies with loss of L3-4 disc space. Moderate facet hypertrophy. No canal stenosis. Mild bilateral L3 foraminal narrowing. L4-5: Ankylosis of the L4 and L5 vertebral bodies with loss of L4-5 disc space. Mild facet hypertrophy. No significant stenosis. L5-S1: Diffuse disc bulge with disc desiccation and intervertebral disc space narrowing. Left far lateral endplate osteophytic spurring. Mild to moderate facet arthrosis. No significant canal stenosis. Moderate to advanced bilateral L5 foraminal narrowing, left worse than right. IMPRESSION: 1. No acute traumatic injury within the lumbar spine. 2. Straightening of the normal lumbar lordosis with  multilevel degenerative spondylolysis and facet arthrosis as above. No significant canal stenosis. Moderate to advanced bilateral L5 foraminal narrowing. 3. Cardiomegaly with advanced aortic atherosclerosis. 4. Marked dilatation of the partially visualized urinary bladder. Correlation with urinary output suggested. Electronically Signed   By: Jeannine Boga M.D.   On: 10/12/2017 16:17   Dg Knee Complete 4 Views Left  Result Date: 10/12/2017 CLINICAL DATA:  Fall today.  Left knee pain.  Initial encounter. EXAM: LEFT KNEE - COMPLETE 4+ VIEW COMPARISON:  None. FINDINGS: No evidence of fracture, dislocation, or joint effusion. No evidence of arthropathy or other focal bone abnormality. Mild patellar enthesopathy noted. Peripheral vascular calcification also seen. IMPRESSION: No acute findings. Electronically Signed   By: Earle Gell M.D.   On: 10/12/2017 14:38   Dg Hip Unilat W Or Wo Pelvis 2-3 Views Left  Result Date: 10/12/2017 CLINICAL DATA:  Fall.  Confusion. EXAM: DG HIP (WITH OR WITHOUT PELVIS) 2-3V  LEFT COMPARISON:  None. FINDINGS: AP view pelvis and AP/frog leg views of the left hip. Femoral heads are located. Vascular calcifications. Femoral heads are located. No acute fracture. Large amount of stool in the rectum. Joint spaces are maintained for age. Degenerate disc disease involves the lumbosacral junction. IMPRESSION: No acute osseous abnormality. Electronically Signed   By: Abigail Miyamoto M.D.   On: 10/12/2017 14:38    EKG: Independently reviewed.  Assessment/Plan Principal Problem:   Rhabdomyolysis Active Problems:   Diabetes mellitus ([place])   Essential hypertension   Atrial fibrillation with slow ventricular response ([facility])   Amnestic MCI (mild cognitive impairment with memory loss)   Elevated troponin   Confusion   Fall    1. Mechanical fall at home 2. Rhabdomyolysis - very mild with CPK 3.6k 1. IVF: NS 125 cc/hr 2. Repeat CPK in am 3. Repeat BMP in AM 4. Strict intake  and output 3. Urinary retention - 1. Bladder scan Q6H 2. I+O cath PRN 3. Place foley if still unable to void 4. No paralysis otherwise noted to indicate CNS injury and imaging negative as below 4. Back pain - 1. CT Head, C spine, L spine negative 2. Will go ahead and get T spine X rays to complete CNS imaging 5. Confusion - with h/o Amnestic MCI 1. Unclear cause, daughter states not at baseline though it seems to be improving 2. CT head negative 3. Repeat CBC in AM to trend for WBC but no other SIRS at this point 4. UA negative 5. MRI brain if persists tomorrow to R/O small stroke 6. Elevated troponin - 1. Unclear etiology and significance 2. Serial trops 3. Tele monitor 4. No chest pain 5. Doubt dissection 7. HTN - continue home meds 8. DM2 - 1. Sensitive SSI AC 2. Hold metformin 9. A.Fib - 1. Resume eliquis tomorrow since no apparent major injury  DVT prophylaxis: Eliquis Code Status: Full Family Communication: Daughter at bedside Disposition Plan: Home after admit Consults called: None Admission status: Place in [place], [place] [facility] Pager [phone]  If 7AM-7PM, please contact day team taking care of patient [email or web address] Password [email or web address]  10/12/2017, 7:51 PM

## 2017-10-12 NOTE — ED Provider Notes (Signed)
[facility] DEPART[facility] Provider Note   CSN: [id number] Arrival date & time: 10/12/17  1246     History   Chief Complaint Chief Complaint  Patient presents with  . Fall    HPI Donna Owens is a 83 y.o. female.  Patient is a 83 year old female who presents after a fall.  Her daughter who is with her reports that she last talked to her about 630 last night and the patient was acting appropriately.  She did not answer her phone this morning and when the daughter went to check on her, she found the patient on the floor.  She does not know how long she was on the floor.  There were several things in the house that were knocked over and several things in the kitchen that were askew.  The patient does not have any recollection of the events.  She is complaining of pain to her back and her left leg.  Per the daughter, she has been acting appropriately up until this point.  She has had no recent illnesses.  History is limited due to the patient's confusion.      Past Medical History:  Diagnosis Date  . Anemia   . Atrophic vaginitis   . Blood transfusion without reported diagnosis   . Cancer ([place])   . Carotid arterial disease ([place]) 12/13/11    mild by doppler  . CVA (cerebral vascular accident) ([place])   . Diabetes mellitus   . Hyperlipemia   . Hypertension   . OSA (obstructive sleep apnea)   . OSA on CPAP 06/20/2014  . Osteoporosis   . Pulmonary hypertension ([place])    moderate echo 11/07/10  . PVD (peripheral vascular disease) ([place])   . Sleep apnea     Patient Active Problem List   Diagnosis Date Noted  . Carotid artery stenosis 01/27/2017  . Frequent falls 01/23/2016  . 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 Atrial fibrillation with slow ventricular response ([place]) 01/21/2016  . Chronic diastolic heart failure ([place]) 01/21/2016  . Atrial flutter ([place]) 07/03/2014  . Chronic anticoagulation, started 06/29/14 for a  flutter 07/03/2014  . OSA on CPAP 06/20/2014  . PAD (peripheral artery disease) ([place]) 06/07/2013  . htn 06/07/2013  . Essential hypertension 06/07/2013  . Dyslipidemia 06/07/2013  . Diabetes mellitus ([place]) 06/24/2011    Past Surgical History:  Procedure Laterality Date  . BREAST LUMPECTOMY Right    chemo/radiation  . BREAST LUMPECTOMY Left    benign  . BREAST SURGERY     right 07/26/2000  . CARDIOVERSION N/A 07/21/2014   Procedure: CARDIOVERSION;  Surgeon: Sanda Klein, MD;  Location: [facility];  Service: Cardiovascular;  Laterality: N/A;  . COLONOSCOPY  2010  . ingrown toenails     removed in 03/2013    OB History    Gravida Para Term Preterm AB Living   4 2     2 1    SAB TAB Ectopic Multiple Live Births   2               Home Medications    Prior to Admission medications   Medication Sig Start Date End Date Taking? Authorizing Provider  acetaminophen (TYLENOL) 500 MG tablet Take 500 mg by mouth every 6 (six) hours as needed for moderate pain.   Yes [provider]  ferrous sulfate 325 (65 FE) MG tablet Take 1 tablet by mouth daily. 02/16/16  Yes [provider]  furosemide (  LASIX) 20 MG tablet TAKE 1 TABLET BY MOUTH THREE TIMES A WEEK MONDAY, WEDNESDAY AND FRIDAY 06/02/17  Yes Croitoru, Mihai, MD  gabapentin (NEURONTIN) 300 MG capsule Take 1 capsule (300 mg total) by mouth at bedtime. Patient taking differently: Take 100 mg by mouth.  01/23/16  Yes Dohmeier, Asencion Partridge, MD  hydrOXYzine (ATARAX/VISTARIL) 25 MG tablet Take 1 tablet (25 mg total) by mouth 3 (three) times daily as needed for itching. 07/20/15  Yes Dohmeier, Asencion Partridge, MD  Polyethyl Glycol-Propyl Glycol (SYSTANE) 0.4-0.3 % SOLN Apply 1 drop to eye every 8 (eight) hours as needed (dry eyes).   Yes [provider]  triamcinolone cream (KENALOG) 0.1 % Apply 1 application topically 2 (two) times daily. Patient taking differently: Apply 1 application topically 2 (two) times daily as needed  (rash/sore). On left foot. 01/12/15  Yes Trula Slade, DPM  trolamine salicylate (ASPERCREME) 10 % cream Apply 1 application topically as needed for muscle pain.   Yes [provider]  vitamin B-12 (CYANOCOBALAMIN) 1000 MCG tablet Take 1,000 mcg by mouth 2 (two) times daily.    Yes [provider]  amLODipine (NORVASC) 5 MG tablet TAKE 1 TABLET(5 MG) BY MOUTH DAILY 06/17/17   Croitoru, Dani Gobble, MD  apixaban (ELIQUIS) 2.5 MG TABS tablet Take 1 tablet (2.5 mg total) by mouth 2 (two) times daily. 08/04/17   Croitoru, Mihai, MD  calcium-vitamin D (OSCAL WITH D) 500-200 MG-UNIT per tablet Take 1 tablet by mouth daily with breakfast.     [provider]  Cholecalciferol (VITAMIN D-3) 1000 UNITS CAPS Take 1 capsule by mouth daily.    [provider]  Coenzyme Q10 (CO Q 10) 100 MG CAPS Take 1 capsule by mouth daily.    [provider]  diazepam (VALIUM) 5 MG tablet Take 1 tablet (5 mg total) by mouth every 8 (eight) hours as needed (dizziness). Patient not taking: Reported on 10/12/2017 04/18/15   Horton, Barbette Hair, MD  econazole nitrate 1 % cream Apply topically daily. Patient not taking: Reported on 10/12/2017 08/06/16   Gardiner Barefoot, DPM  lisinopril (PRINIVIL,ZESTRIL) 20 MG tablet TAKE 2 TABLETS BY MOUTH DAILY Patient not taking: Reported on 10/12/2017 11/25/16   Croitoru, Mihai, MD  metFORMIN (GLUCOPHAGE) 500 MG tablet Take 1 tablet by mouth daily. 04/08/16   [provider]  olmesartan (BENICAR) 20 MG tablet Take 20 tablets by mouth daily. 09/24/17   [provider]  pravastatin (PRAVACHOL) 40 MG tablet Take 40 mg by mouth daily.    [provider]  promethazine (PHENERGAN) 25 MG tablet Take 25 mg by mouth daily. nausea    [provider]  traMADol (ULTRAM) 50 MG tablet Take 1 tablet (50 mg total) by mouth every 6 (six) hours as needed. Patient not taking: Reported on 10/12/2017 06/19/16   [name], MD    Family  History Family History  Problem Relation Age of Onset  . Diabetes Brother   . Hypertension Brother   . Heart failure Daughter 44  . Diabetes Other     Social History Social History  Substance Use Topics  . Smoking status: Never Smoker  . Smokeless tobacco: Never Used  . Alcohol use No     Allergies   Patient has no known allergies.   Review of Systems Review of Systems  Unable to perform ROS: Mental status change     Physical Exam Updated Vital Signs BP (!) 170/85   Pulse 81   Temp 98.2 F (36.8 C) (  Rectal)   Resp (!) 22   SpO2 100%   Physical Exam  Constitutional: She appears well-developed and well-nourished.  HENT:  Head: Normocephalic and atraumatic.  Small hematoma, some dried blood around her lips but no bleeding lacerations are noted in her mouth or tongue  Eyes: Pupils are equal, round, and reactive to light.  Neck:  Patient has a c-collar in place.  She has tenderness on palpation of the cervical spine.  There is no pain to the thoracic spine.  There is tenderness throughout the lumbosacral spine  Cardiovascular: Normal rate, regular rhythm and normal heart sounds.   Pulmonary/Chest: Effort normal and breath sounds normal. No respiratory distress. She has no wheezes. She has no rales. She exhibits no tenderness.  Abdominal: Soft. Bowel sounds are normal. There is no tenderness. There is no rebound and no guarding.  Musculoskeletal: Normal range of motion. She exhibits no edema.  Patient has pain on range of motion of the left knee and left hip.  There is no pain to the ankle.  No swelling of the joints are noted.  No pedal edema is noted.  Pedal pulses are intact.  Lymphadenopathy:    She has no cervical adenopathy.  Neurological: She is alert.  Patient is alert and answers questions.  She is oriented to person and place but disoriented to year.  She does not have any recollection of the events of the fall.  She moves all extremities symmetrically without  focal deficit.  There is no slurred speech.  No facial drooping.  Skin: Skin is warm and dry. No rash noted.  Psychiatric: She has a normal mood and affect.     ED Treatments / Results  Labs (all labs ordered are listed, but only abnormal results are displayed) Labs Reviewed  CBC WITH DIFFERENTIAL/PLATELET - Abnormal; Notable for the following:       Result Value   WBC 16.2 (*)    Hemoglobin 15.3 (*)    Neutro Abs 14.0 (*)    Lymphs Abs 0.6 (*)    Monocytes Absolute 1.6 (*)    All other components within normal limits  COMPREHENSIVE METABOLIC PANEL - Abnormal; Notable for the following:    Glucose, Bld 153 (*)    BUN 35 (*)    Creatinine, Ser 1.33 (*)    Total Protein 8.5 (*)    AST 117 (*)    Total Bilirubin 1.4 (*)    GFR calc non Af Amer 35 (*)    GFR calc Af Amer 40 (*)    All other components within normal limits  CK - Abnormal; Notable for the following:    Total CK 3,668 (*)    All other components within normal limits  TROPONIN I - Abnormal; Notable for the following:    Troponin I 0.30 (*)    All other components within normal limits  CBG MONITORING, ED - Abnormal; Notable for the following:    Glucose-Capillary 126 (*)    All other components within normal limits  URINALYSIS, ROUTINE W REFLEX MICROSCOPIC    EKG  EKG Interpretation  Date/Time:  Sunday October 12 2017 12:54:42 EDT Ventricular Rate:  87 PR Interval:    QRS Duration: 75 QT Interval:  355 QTC Calculation: 405 R Axis:   66 Text Interpretation:  Atrial fibrillation Ventricular premature complex Anterior infarct, old Baseline wander in lead(s) II III aVF Confirmed by Malvin Johns [phone]) on 10/12/2017 1:14:40 PM       Radiology Dg Chest  1 View  Result Date: 10/12/2017 CLINICAL DATA:  Fall.  Confusion. EXAM: CHEST 1 VIEW COMPARISON:  04/19/2013 FINDINGS: Surgical clips about the right breast and axilla. Midline trachea. Moderate cardiomegaly. Atherosclerosis in the transverse aorta. No  pleural effusion or pneumothorax. No congestive failure. Clear lungs. IMPRESSION: Cardiomegaly, without congestive failure. Aortic Atherosclerosis (ICD10-I70.0). Electronically Signed   By: Abigail Miyamoto M.D.   On: 10/12/2017 14:32   Dg Lumbar Spine Complete  Result Date: 10/12/2017 CLINICAL DATA:  Fall.  Confusion. EXAM: LUMBAR SPINE - COMPLETE 4+ VIEW COMPARISON:  08/03/2012 FINDINGS: Extensive overlying bowel gas, degrading the frontal radiograph. Sacroiliac joints are grossly symmetric. Large amount of stool in the rectum. Abdominal aortic atherosclerosis. The lateral views are suboptimal secondary to technique. The elbow overlies the upper lumbar spine. No gross vertebral body height loss. Straightening of expected lordosis. Spondylosis is age expected. IMPRESSION: Moderately degraded exam secondary to positioning. No gross acute osseous abnormality identified. Nonspecific straightening of expected lordosis. Aortic Atherosclerosis (ICD10-I70.0). Possible constipation. Electronically Signed   By: Abigail Miyamoto M.D.   On: 10/12/2017 14:34   Ct Head Wo Contrast  Result Date: 10/12/2017 CLINICAL DATA:  Altered level of consciousness. Patient presents to the ED from home with complaints of fall. Patient reports trip over plant. Patient reports has been on the floor all night until daughter found her this morning. EXAM: CT HEAD WITHOUT CONTRAST TECHNIQUE: Contiguous axial images were obtained from the base of the skull through the vertex without intravenous contrast. COMPARISON:  06/19/2016 FINDINGS: Brain: No evidence of acute infarction, hemorrhage, extra-axial collection, ventriculomegaly, or mass effect. Generalized cerebral atrophy. Periventricular white matter low attenuation likely secondary to microangiopathy. Vascular: Cerebrovascular atherosclerotic calcifications are noted. Skull: Negative for fracture or focal lesion. Sinuses/Orbits: Visualized portions of the orbits are unremarkable. Mastoid  sinuses are clear. Small air-fluid level in the left maxillary sinus. Other: None. IMPRESSION: 1. No acute intracranial pathology. 2. Chronic microvascular disease and cerebral atrophy. Electronically Signed   By: Kathreen Devoid   On: 10/12/2017 16:11   Ct Lumbar Spine Wo Contrast  Result Date: 10/12/2017 CLINICAL DATA:  Initial evaluation for acute back pain, fall. EXAM: CT LUMBAR SPINE WITHOUT CONTRAST TECHNIQUE: Multidetector CT imaging of the lumbar spine was performed without intravenous contrast administration. Multiplanar CT image reconstructions were also generated. COMPARISON:  Comparison made with prior radiograph from earlier the same day. FINDINGS: Segmentation: Normal segmentation. Lowest well-formed disc labeled the L5-S1 level. Alignment: Straightening of the normal lumbar lordosis with trace dextroscoliosis. No listhesis. Vertebrae: Vertebral body heights are maintained. No evidence for acute or chronic fracture. No discrete lytic or blastic osseous lesions. Partially visualized sacrum and pelvis are intact. SI joints approximated and symmetric. L3, L4, and L5 vertebral bodies are partially ankylosed. Paraspinal and other soft tissues: Paraspinous soft tissues demonstrate no acute abnormality. Visualized lungs are clear. Cardiomegaly partially visualized. Extensive aortic atherosclerosis. Visualized urinary bladder is markedly distended. Disc levels: L1-2: Mild circumferential disc bulge with intervertebral disc space narrowing. Moderate facet and ligamentum flavum hypertrophy. No significant stenosis. L2-3: Mild diffuse disc bulge with intervertebral disc space narrowing and disc desiccation. Moderate facet and ligamentum flavum hypertrophy. No significant stenosis. L3-4: Ankylosis of the L3 and L4 vertebral bodies with loss of L3-4 disc space. Moderate facet hypertrophy. No canal stenosis. Mild bilateral L3 foraminal narrowing. L4-5: Ankylosis of the L4 and L5 vertebral bodies with loss of L4-5  disc space. Mild facet hypertrophy. No significant stenosis. L5-S1: Diffuse disc bulge with disc desiccation and intervertebral  disc space narrowing. Left far lateral endplate osteophytic spurring. Mild to moderate facet arthrosis. No significant canal stenosis. Moderate to advanced bilateral L5 foraminal narrowing, left worse than right. IMPRESSION: 1. No acute traumatic injury within the lumbar spine. 2. Straightening of the normal lumbar lordosis with multilevel degenerative spondylolysis and facet arthrosis as above. No significant canal stenosis. Moderate to advanced bilateral L5 foraminal narrowing. 3. Cardiomegaly with advanced aortic atherosclerosis. 4. Marked dilatation of the partially visualized urinary bladder. Correlation with urinary output suggested. Electronically Signed   By: Jeannine Boga M.D.   On: 10/12/2017 16:17   Dg Knee Complete 4 Views Left  Result Date: 10/12/2017 CLINICAL DATA:  Fall today.  Left knee pain.  Initial encounter. EXAM: LEFT KNEE - COMPLETE 4+ VIEW COMPARISON:  None. FINDINGS: No evidence of fracture, dislocation, or joint effusion. No evidence of arthropathy or other focal bone abnormality. Mild patellar enthesopathy noted. Peripheral vascular calcification also seen. IMPRESSION: No acute findings. Electronically Signed   By: Earle Gell M.D.   On: 10/12/2017 14:38   Dg Hip Unilat W Or Wo Pelvis 2-3 Views Left  Result Date: 10/12/2017 CLINICAL DATA:  Fall.  Confusion. EXAM: DG HIP (WITH OR WITHOUT PELVIS) 2-3V LEFT COMPARISON:  None. FINDINGS: AP view pelvis and AP/frog leg views of the left hip. Femoral heads are located. Vascular calcifications. Femoral heads are located. No acute fracture. Large amount of stool in the rectum. Joint spaces are maintained for age. Degenerate disc disease involves the lumbosacral junction. IMPRESSION: No acute osseous abnormality. Electronically Signed   By: Abigail Miyamoto M.D.   On: 10/12/2017 14:38     Procedures Procedures (including critical care time)  Medications Ordered in ED Medications  sodium chloride 0.9 % bolus 500 mL (500 mLs Intravenous New Bag/Given 10/12/17 1637)  fentaNYL (SUBLIMAZE) injection 50 mcg (50 mcg Intravenous Given 10/12/17 1453)  sodium chloride 0.9 % bolus 500 mL (500 mLs Intravenous New Bag/Given 10/12/17 1609)     Initial Impression / Assessment and Plan / ED Course  I have reviewed the triage vital signs and the nursing notes.  Pertinent labs & imaging results that were available during my care of the patient were reviewed by me and considered in my medical decision making (see chart for details).     Pt presents with encephalopathy after being found on the floor by family.  Is confused, but no focal deficits.  Has elevated WBC, but no fever or other signs of sepsis. CXR clear.  Urine pending.  EKG ok, trop mildly elevated.  Also has rhabdomyolysis.  IV fluids have been started.  She had x-rays of her left hip left knee and lumbar spine which show no evidence of acute injuries although the LS spine was inadequate for evaluation.  She had a CT of the lumbar spine which shows no acute injuries.  Head CT is negative for acute abnormalities.  CT the C-spine is pending.  Dr. Tyrone Nine to follow pending results.  Will need admission.  Final Clinical Impressions(s) / ED Diagnoses   Final diagnoses:  Fall, initial encounter  Encephalopathy  Non-traumatic rhabdomyolysis    New Prescriptions New Prescriptions   No medications on file     Malvin Johns, MD 10/12/17 1650

## 2017-10-12 NOTE — ED Notes (Signed)
Emergency Contact daughter [phone]

## 2017-10-12 NOTE — Progress Notes (Signed)
Pt BP elevated, Avapro and Norvesc given at [facility] [place], no PRNs ordered, no scheduled BP meds tonight. Schorr, NP notified.

## 2017-10-12 NOTE — ED Notes (Signed)
Patient transported to CT 

## 2017-10-12 NOTE — ED Notes (Addendum)
Lab called patient Trop .30 EDP made aware

## 2017-10-12 NOTE — ED Triage Notes (Addendum)
Patient presents to the ED from home with complaints of fall. Patient reports trip over plant. Patient reports has been on the floor all night until daughter found her this morning. Patient  Loss control of Bowel. Per EMS patient was alerted on arrival. Patient able to tell name and DOB , Patient not oriented to time or place. Patient given 13mcg of fentyal with EMS. Patient complains of left hip and knee pain,. . Patient has contusion to posterior right side, skin tear on left elbow. Patient is on blood thinners.

## 2017-10-13 DIAGNOSIS — K219 Gastro-esophageal reflux disease without esophagitis: Secondary | ICD-10-CM | POA: Diagnosis not present

## 2017-10-13 DIAGNOSIS — W1830XA Fall on same level, unspecified, initial encounter: Secondary | ICD-10-CM | POA: Diagnosis present

## 2017-10-13 DIAGNOSIS — M810 Age-related osteoporosis without current pathological fracture: Secondary | ICD-10-CM | POA: Diagnosis present

## 2017-10-13 DIAGNOSIS — E785 Hyperlipidemia, unspecified: Secondary | ICD-10-CM | POA: Diagnosis present

## 2017-10-13 DIAGNOSIS — E1151 Type 2 diabetes mellitus with diabetic peripheral angiopathy without gangrene: Secondary | ICD-10-CM | POA: Diagnosis present

## 2017-10-13 DIAGNOSIS — R339 Retention of urine, unspecified: Secondary | ICD-10-CM | POA: Diagnosis present

## 2017-10-13 DIAGNOSIS — R4182 Altered mental status, unspecified: Secondary | ICD-10-CM | POA: Diagnosis not present

## 2017-10-13 DIAGNOSIS — I272 Pulmonary hypertension, unspecified: Secondary | ICD-10-CM | POA: Diagnosis present

## 2017-10-13 DIAGNOSIS — Z8673 Personal history of transient ischemic attack (TIA), and cerebral infarction without residual deficits: Secondary | ICD-10-CM | POA: Diagnosis not present

## 2017-10-13 DIAGNOSIS — I4891 Unspecified atrial fibrillation: Secondary | ICD-10-CM | POA: Diagnosis not present

## 2017-10-13 DIAGNOSIS — K5900 Constipation, unspecified: Secondary | ICD-10-CM | POA: Diagnosis not present

## 2017-10-13 DIAGNOSIS — Z833 Family history of diabetes mellitus: Secondary | ICD-10-CM | POA: Diagnosis not present

## 2017-10-13 DIAGNOSIS — M6281 Muscle weakness (generalized): Secondary | ICD-10-CM | POA: Diagnosis not present

## 2017-10-13 DIAGNOSIS — R338 Other retention of urine: Secondary | ICD-10-CM | POA: Diagnosis not present

## 2017-10-13 DIAGNOSIS — T796XXA Traumatic ischemia of muscle, initial encounter: Secondary | ICD-10-CM | POA: Diagnosis present

## 2017-10-13 DIAGNOSIS — Z23 Encounter for immunization: Secondary | ICD-10-CM | POA: Diagnosis not present

## 2017-10-13 DIAGNOSIS — N390 Urinary tract infection, site not specified: Secondary | ICD-10-CM | POA: Diagnosis not present

## 2017-10-13 DIAGNOSIS — Y92009 Unspecified place in unspecified non-institutional (private) residence as the place of occurrence of the external cause: Secondary | ICD-10-CM | POA: Diagnosis not present

## 2017-10-13 DIAGNOSIS — Z7901 Long term (current) use of anticoagulants: Secondary | ICD-10-CM | POA: Diagnosis not present

## 2017-10-13 DIAGNOSIS — G4733 Obstructive sleep apnea (adult) (pediatric): Secondary | ICD-10-CM | POA: Diagnosis present

## 2017-10-13 DIAGNOSIS — I5032 Chronic diastolic (congestive) heart failure: Secondary | ICD-10-CM | POA: Diagnosis present

## 2017-10-13 DIAGNOSIS — Z111 Encounter for screening for respiratory tuberculosis: Secondary | ICD-10-CM | POA: Diagnosis not present

## 2017-10-13 DIAGNOSIS — E7849 Other hyperlipidemia: Secondary | ICD-10-CM | POA: Diagnosis not present

## 2017-10-13 DIAGNOSIS — M549 Dorsalgia, unspecified: Secondary | ICD-10-CM | POA: Diagnosis not present

## 2017-10-13 DIAGNOSIS — R110 Nausea: Secondary | ICD-10-CM | POA: Diagnosis not present

## 2017-10-13 DIAGNOSIS — N139 Obstructive and reflux uropathy, unspecified: Secondary | ICD-10-CM | POA: Diagnosis not present

## 2017-10-13 DIAGNOSIS — Z8249 Family history of ischemic heart disease and other diseases of the circulatory system: Secondary | ICD-10-CM | POA: Diagnosis not present

## 2017-10-13 DIAGNOSIS — R7989 Other specified abnormal findings of blood chemistry: Secondary | ICD-10-CM | POA: Diagnosis not present

## 2017-10-13 DIAGNOSIS — L8990 Pressure ulcer of unspecified site, unspecified stage: Secondary | ICD-10-CM | POA: Insufficient documentation

## 2017-10-13 DIAGNOSIS — M6282 Rhabdomyolysis: Secondary | ICD-10-CM | POA: Diagnosis not present

## 2017-10-13 DIAGNOSIS — R41841 Cognitive communication deficit: Secondary | ICD-10-CM | POA: Diagnosis not present

## 2017-10-13 DIAGNOSIS — Z9181 History of falling: Secondary | ICD-10-CM | POA: Diagnosis not present

## 2017-10-13 DIAGNOSIS — S0003XA Contusion of scalp, initial encounter: Secondary | ICD-10-CM | POA: Diagnosis present

## 2017-10-13 DIAGNOSIS — I110 Hypertensive heart disease with heart failure: Secondary | ICD-10-CM | POA: Diagnosis present

## 2017-10-13 DIAGNOSIS — L89152 Pressure ulcer of sacral region, stage 2: Secondary | ICD-10-CM | POA: Diagnosis present

## 2017-10-13 DIAGNOSIS — G9340 Encephalopathy, unspecified: Secondary | ICD-10-CM | POA: Diagnosis not present

## 2017-10-13 DIAGNOSIS — G3184 Mild cognitive impairment, so stated: Secondary | ICD-10-CM | POA: Diagnosis present

## 2017-10-13 DIAGNOSIS — M4326 Fusion of spine, lumbar region: Secondary | ICD-10-CM | POA: Diagnosis present

## 2017-10-13 DIAGNOSIS — R52 Pain, unspecified: Secondary | ICD-10-CM | POA: Diagnosis not present

## 2017-10-13 DIAGNOSIS — Z7984 Long term (current) use of oral hypoglycemic drugs: Secondary | ICD-10-CM | POA: Diagnosis not present

## 2017-10-13 DIAGNOSIS — M545 Low back pain: Secondary | ICD-10-CM | POA: Diagnosis not present

## 2017-10-13 DIAGNOSIS — R262 Difficulty in walking, not elsewhere classified: Secondary | ICD-10-CM | POA: Diagnosis not present

## 2017-10-13 DIAGNOSIS — L299 Pruritus, unspecified: Secondary | ICD-10-CM | POA: Diagnosis not present

## 2017-10-13 DIAGNOSIS — S3992XA Unspecified injury of lower back, initial encounter: Secondary | ICD-10-CM | POA: Diagnosis not present

## 2017-10-13 DIAGNOSIS — I10 Essential (primary) hypertension: Secondary | ICD-10-CM | POA: Diagnosis not present

## 2017-10-13 DIAGNOSIS — M5127 Other intervertebral disc displacement, lumbosacral region: Secondary | ICD-10-CM | POA: Diagnosis present

## 2017-10-13 DIAGNOSIS — H04129 Dry eye syndrome of unspecified lacrimal gland: Secondary | ICD-10-CM | POA: Diagnosis not present

## 2017-10-13 DIAGNOSIS — R410 Disorientation, unspecified: Secondary | ICD-10-CM | POA: Diagnosis not present

## 2017-10-13 DIAGNOSIS — E569 Vitamin deficiency, unspecified: Secondary | ICD-10-CM | POA: Diagnosis not present

## 2017-10-13 DIAGNOSIS — E119 Type 2 diabetes mellitus without complications: Secondary | ICD-10-CM | POA: Diagnosis not present

## 2017-10-13 LAB — BASIC METABOLIC PANEL
Anion gap: 6 (ref 5–15)
BUN: 28 mg/dL — AB (ref 6–20)
CALCIUM: 8.1 mg/dL — AB (ref 8.9–10.3)
CO2: 19 mmol/L — ABNORMAL LOW (ref 22–32)
CREATININE: 1.14 mg/dL — AB (ref 0.44–1.00)
Chloride: 109 mmol/L (ref 101–111)
GFR calc Af Amer: 49 mL/min — ABNORMAL LOW (ref >60)
GFR, EST NON AFRICAN AMERICAN: 42 mL/min — AB (ref >60)
GLUCOSE: 83 mg/dL (ref 65–99)
Potassium: 3.5 mmol/L (ref 3.5–5.1)
SODIUM: 134 mmol/L — AB (ref 135–145)

## 2017-10-13 LAB — GLUCOSE, CAPILLARY
GLUCOSE-CAPILLARY: 105 mg/dL — AB (ref 65–99)
GLUCOSE-CAPILLARY: 189 mg/dL — AB (ref 65–99)
Glucose-Capillary: 88 mg/dL (ref 65–99)
Glucose-Capillary: 118 mg/dL — ABNORMAL HIGH (ref 65–99)
Glucose-Capillary: 147 mg/dL — ABNORMAL HIGH (ref 65–99)

## 2017-10-13 LAB — CBC
HCT: 35.7 % — ABNORMAL LOW (ref 36.0–46.0)
Hemoglobin: 12 g/dL (ref 12.0–15.0)
MCH: 29.2 pg (ref 26.0–34.0)
MCHC: 33.6 g/dL (ref 30.0–36.0)
MCV: 86.9 fL (ref 78.0–100.0)
PLATELETS: 176 10*3/uL (ref 150–400)
RBC: 4.11 MIL/uL (ref 3.87–5.11)
RDW: 14 % (ref 11.5–15.5)
WBC: 12.1 10*3/uL — ABNORMAL HIGH (ref 4.0–10.5)

## 2017-10-13 LAB — VITAMIN B12: Vitamin B-12: 3005 pg/mL — ABNORMAL HIGH (ref 180–914)

## 2017-10-13 LAB — CK: CK TOTAL: 2212 U/L — AB (ref 38–234)

## 2017-10-13 LAB — TROPONIN I
TROPONIN I: 0.3 ng/mL — AB (ref <0.03)
Troponin I: 0.21 ng/mL (ref <0.03)

## 2017-10-13 LAB — AMMONIA: Ammonia: 9 umol/L (ref 9–35)

## 2017-10-13 LAB — TSH: TSH: 0.437 u[IU]/mL (ref 0.350–4.500)

## 2017-10-13 NOTE — Evaluation (Signed)
Occupational Therapy Evaluation Patient Details Name: Donna Owens MRN: [id number] DOB: August 03, 1928 Today's Date: 10/13/2017    History of Present Illness 83 y.o. female with medical history significant of A.Fib, CVA, DM2, HTN.  Patient presents to the ED after a fall at home. Imaging negative for fractures.    Clinical Impression   PTA, pt was living alone and performing BADLs. Pt presenting with decreased strength, balance, and cognition. Pt requiring Min A for UB ADLs, Mod A for LB ADLs, and Min A for stand pivot to recliner. Pt would benefit form further acute OT to facilitate safe dc. Recommend dc to SNF for further OT to increase safety and independence with ADLs and functional mobility.     Follow Up Recommendations  SNF;Supervision/Assistance - 24 hour    Equipment Recommendations  Other (comment) (Defer to next venue)    Recommendations for Other Services PT consult     Precautions / Restrictions Precautions Precautions: Fall Precaution Comments: pt reports 6-7 falls in past 1 year Restrictions Weight Bearing Restrictions: No      Mobility Bed Mobility Overal bed mobility: Needs Assistance Bed Mobility: Supine to Sit     Supine to sit: Max assist;+2 for safety/equipment     General bed mobility comments: Max A to bring BLEs to eob and elevate trunk  Transfers Overall transfer level: Needs assistance Equipment used: Rolling walker (2 wheeled) Transfers: Sit to/from Stand Sit to Stand: Min assist;From elevated surface         General transfer comment: Min A to power into standing from elevated surface.    Balance Overall balance assessment: History of Falls;Needs assistance   Sitting balance-Leahy Scale: Good     Standing balance support: Bilateral upper extremity supported Standing balance-Leahy Scale: Poor Standing balance comment: requires BUE support                           ADL either performed or assessed with clinical judgement    ADL Overall ADL's : Needs assistance/impaired Eating/Feeding: Set up;Sitting   Grooming: Sitting;Set up;Supervision/safety   Upper Body Bathing: Minimal assistance;Sitting   Lower Body Bathing: Moderate assistance;Sit to/from stand   Upper Body Dressing : Minimal assistance;Sitting   Lower Body Dressing: Moderate assistance;Sit to/from stand   Toilet Transfer: Minimal assistance;Stand-pivot;RW (Simulated to recliner)           Functional mobility during ADLs: Minimal assistance;Rolling walker (Stand pivot only) General ADL Comments: Pt demosntrating increased confusion and poor fucnitonal performance. Pt agreeable to stand pivot to recliner to sit up for lunch but states she will not walk further due to pain in Bil ankles/heels.     Vision Baseline Vision/History: Wears glasses Wears Glasses: At all times Patient Visual Report: No change from baseline       Perception     Praxis      Pertinent Vitals/Pain Pain Assessment: 0-10 Pain Score: 9  Pain Location: R heel Pain Descriptors / Indicators: Sore Pain Intervention(s): Monitored during session;Limited activity within patient's tolerance;Repositioned;RN gave pain meds during session     Hand Dominance Right   Extremity/Trunk Assessment Upper Extremity Assessment Upper Extremity Assessment: Generalized weakness   Lower Extremity Assessment Lower Extremity Assessment: Defer to PT evaluation   Cervical / Trunk Assessment Cervical / Trunk Assessment: Kyphotic   Communication Communication Communication: No difficulties   Cognition Arousal/Alertness: Awake/alert Behavior During Therapy: WFL for tasks assessed/performed Overall Cognitive Status: Impaired/Different from baseline Area of Impairment: Memory;Orientation;Safety/judgement;Following commands;Attention;Awareness;Problem solving  Current Attention Level: Sustained Memory: Decreased short-term memory Following Commands:  Follows one step commands with increased time Safety/Judgement: Decreased awareness of safety;Decreased awareness of deficits Awareness: Intellectual Problem Solving: Slow processing;Requires verbal cues General Comments: Pt requiring increase verbal and visual cues for bedm obility and transfer. Pt easliy aggitated with family in room stating "I don't need anyone adding or subtracting to my story." Pt verbaliing being in a "dark space" when she fell but unable to explain anything further.    General Comments  Pt granddaguther present for first half of session (Pt reporting several falls at home)    Exercises     Shoulder Instructions      Home Living Family/patient expects to be discharged to:: Private residence Living Arrangements: Alone Available Help at Discharge: Family;Available PRN/intermittently Type of Home: House Home Access: Stairs to enter [employer] of Steps: 5   Home Layout: One level     Bathroom Shower/Tub: Teacher, early years/pre: Standard     Home Equipment: Environmental consultant - 2 wheels;Cane - single point;Shower seat          Prior Functioning/Environment Level of Independence: Needs assistance  Gait / Transfers Assistance Needed: walks with [facility] ADL's / Homemaking Assistance Needed: Per granddaughter pt's daughter comes to supervise bathing in tub. Pt reports that she is independent with all ADLs and IADLs except driving.   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        OT Problem List: Decreased strength;Decreased activity tolerance;Decreased range of motion;Impaired balance (sitting and/or standing);Decreased safety awareness;Decreased knowledge of use of DME or AE;Decreased knowledge of precautions;Pain;Decreased cognition      OT Treatment/Interventions: Self-care/ADL training;Therapeutic exercise;Energy conservation;DME and/or  AE instruction;Therapeutic activities;Patient/family education    OT Goals(Current goals can be found in the care plan section) Acute Rehab OT Goals Patient Stated Goal: to return home OT Goal Formulation: With patient Time For Goal Achievement: 10/27/17 Potential to Achieve Goals: Good ADL Goals Pt Will Perform Grooming: standing;with set-up;with supervision Pt Will Perform Upper Body Dressing: with set-up;with supervision;sitting Pt Will Perform Lower Body Dressing: with set-up;with supervision;sit to/from stand Pt Will Transfer to Toilet: with set-up;with supervision;ambulating;regular height toilet Pt Will Perform Toileting - Clothing Manipulation and hygiene: with set-up;with supervision;sit to/from stand  OT Frequency: Min 2X/week   Barriers to D/C: Decreased caregiver support  Pt not letting family stay with her       Co-evaluation              AM-PAC PT "6 Clicks" Daily Activity     Outcome Measure Help from another person eating meals?: None Help from another person taking care of personal grooming?: A Little Help from another person toileting, which includes using toliet, bedpan, or urinal?: A Little Help from another person bathing (including washing, rinsing, drying)?: A Lot Help from another person to put on and taking off regular upper body clothing?: A Little Help from another person to put on and taking off regular lower body clothing?: A Lot 6 Click Score: 17   End of Session Equipment Utilized During Treatment: Gait belt;Rolling walker Nurse Communication: Mobility status  Activity Tolerance: Patient limited by pain Patient left: in chair;with call bell/phone within reach;with chair alarm set;with nursing/sitter in room  OT Visit Diagnosis: Unsteadiness on feet (R26.81);Other abnormalities of gait and mobility (R26.89);Muscle weakness (generalized) (M62.81);Pain;Other symptoms and signs involving cognitive function;History of falling (Z91.81) Pain -  Right/Left:  (Bil) Pain - part of body: Ankle and joints of foot                Time: 9774-1423 OT Time Calculation (min): 30 min Charges:  OT General Charges $OT Visit: 1 Visit OT Evaluation $OT Eval Moderate Complexity: 1 Mod OT Treatments $Self Care/Home Management : 8-22 mins G-Codes: OT G-codes **NOT FOR INPATIENT CLASS** Functional Assessment Tool Used: Clinical judgement Functional Limitation: Self care Self Care Current Status (T5320): At least 40 percent but less than 60 percent impaired, limited or restricted Self Care Goal Status (E3343): At least 20 percent but less than 40 percent impaired, limited or restricted   [place], OTR/L Acute Rehab Pager: [phone] Office: [place] 10/13/2017, 11:54 AM

## 2017-10-13 NOTE — Progress Notes (Addendum)
PROGRESS NOTE    Donna Owens  AXK:[id number] DOB: 1928-02-19 DOA: 10/12/2017 PCP: Leanna Battles, MD   Outpatient Specialists:     Brief Narrative:  Donna Owens is a 83 y.o. female with medical history significant of A.Fib, CVA, DM2, HTN.  Patient presents to the ED after a fall at home.  LKW 630pm last night per daughter.  Patient didn't answer phone this AM, daughter went to check on her found patient on floor.  Several things in house knocked over.  Patient doesn't have any recollection of events.  Complaining of pain to low back and L leg.  ED Course: CT head, neck, L spine neg, X rays of L knee, L hip and pelvis, CXR all neg other than some questionable constipation (patient then has large BM in ED), and bladder distention (patient does appear to have urinary retention).   Assessment & Plan:   Principal Problem:   Acute urinary retention Active Problems:   Diabetes mellitus ([place])   Essential hypertension   Atrial fibrillation with slow ventricular response (HCC)   Amnestic MCI (mild cognitive impairment with memory loss)   Rhabdomyolysis   Elevated troponin   Confusion   Fall   Pressure injury of skin   Fall at home  Positive orthos  ? Dehydration  PT/OT eval  Patient is a hoarder per family at bedside  Rhabdomyolysis   -follow [facility]  -IVF continue until < 500  Urinary retention - Bladder scan Q6H I+O cath PRN  Back pain - Imaging negative  Confusion - with h/o Amnestic MCI Unclear cause, daughter states not at baseline though it seems to be improving CT head negative -x ray/U/A negative, no sign of infection -may need MRI -B12/TSH/ammonia  Elevated troponin - Flat No chest pain  HTN - continue home meds  DM2 - Sensitive SSI AC Hold metformin  Stage 2 pressure ulcer  A.Fib - Resume eliquis tomorrow since no apparent major injury  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  Also unclear how long she was down.  Granddaughter said family  heard/saw her down on Saturday AM but patient did not want them to break the door.  So family left.  EMS not called until Sunday night.  DVT prophylaxis:  Fully anticoagulated   Code Status: Full Code   Family Communication: Grand-daughter at bedside  Disposition Plan:     Consultants:      Subjective: Talking about seeing the floors moving and folding up while on the ground -said she didn\'t remember how she fell but got agitated this was repeated back to her and she stated she knew exactly how she fell  Objective: Vitals:   10/12/17 2130 10/12/17 2300 10/13/17 0445 10/13/17 1200  BP: (!) 189/78 (!) 145/66 (!) 142/55   Pulse: 75 86 78   Resp: 17  16   Temp: 98.5 F (36.9 C)  99.7 F (37.6 C)   TempSrc: Oral  Axillary   SpO2: 100%  100%   Height:    5\' 5" (1.651 m)    Intake/Output Summary (Last 24 hours) at 10/13/17 1211 Last data filed at 10/13/17 0500  Gross per 24 hour  Intake          2050.42 ml  Output             1900 ml  Net           15 0.42 ml   There were no vitals filed for this visit.  Examination:  General exam:  easily irritated  Respiratory system: Clear to auscultation. Respiratory effort normal. Cardiovascular system: irr Gastrointestinal system: Abdomen is nondistended, soft and nontender. No organomegaly or masses felt. Normal bowel sounds heard. Central nervous system: Alert and oriented to person but not situation Extremities: getting up with OT- moves all 4 ext Skin: wound on sacrum Psychiatry: judgement impaired    Data Reviewed: I have personally reviewed following labs and imaging studies  CBC:  Recent Labs Lab 10/12/17 1446 10/13/17 0712  WBC 16.2* 12.1*  NEUTROABS 14.0*  --   HGB 15.3* 12.0  HCT 43.6 35.7*  MCV 87.2 86.9  PLT 199 557   Basic Metabolic Panel:  Recent Labs Lab 10/12/17 1446 10/13/17 0712  NA 135 134*  K 4.0 3.5  CL 103 109  CO2 22 19*  GLUCOSE 153* 83  BUN 35* 28*  CREATININE 1.33* 1.14*    CALCIUM 9.5 8.1*   GFR: CrCl cannot be calculated (Unknown ideal weight.). Liver Function Tests:  Recent Labs Lab 10/12/17 1446  AST 117*  ALT 47  ALKPHOS 101  BILITOT 1.4*  PROT 8.5*  ALBUMIN 3.7   No results for input(s): LIPASE, AMYLASE in the last 168 hours. No results for input(s): AMMONIA in the last 168 hours. Coagulation Profile: No results for input(s): INR, PROTIME in the last 168 hours. Cardiac Enzymes:  Recent Labs Lab 10/12/17 1446 10/12/17 2146 10/13/17 0203 10/13/17 0712  CKTOTAL 3,668*  --  2,212*  --   TROPONINI 0.30* 0.25* 0.30* 0.21*   BNP (last 3 results) No results for input(s): PROBNP in the last 8760 hours. HbA1C: No results for input(s): HGBA1C in the last 72 hours. CBG:  Recent Labs Lab 10/12/17 1334 10/12/17 2126 10/13/17 0741  GLUCAP 126* 105* 88   Lipid Profile: No results for input(s): CHOL, HDL, LDLCALC, TRIG, CHOLHDL, LDLDIRECT in the last 72 hours. Thyroid Function Tests: No results for input(s): TSH, T4TOTAL, FREET4, T3FREE, THYROIDAB in the last 72 hours. Anemia Panel: No results for input(s): VITAMINB12, FOLATE, FERRITIN, TIBC, IRON, RETICCTPCT in the last 72 hours. Urine analysis:    Component Value Date/Time   COLORURINE YELLOW 10/12/2017 [place] 10/12/2017 1650   APPEARANCEUR Slightly cloudy 06/21/2015 1358   LABSPEC 1.017 10/12/2017 1650   PHURINE 5.0 10/12/2017 1650   GLUCOSEU NEGATIVE 10/12/2017 1650   HGBUR LARGE (A) 10/12/2017 1650   BILIRUBINUR NEGATIVE 10/12/2017 1650   BILIRUBINUR Negative 06/21/2015 1358   KETONESUR 5 (A) 10/12/2017 1650   PROTEINUR 100 (A) 10/12/2017 1650   UROBILINOGEN 0.2 04/18/2015 1330   NITRITE NEGATIVE 10/12/2017 1650   LEUKOCYTESUR NEGATIVE 10/12/2017 1650   LEUKOCYTESUR 2+ (A) 06/21/2015 1358     )No results found for this or any previous visit (from the past 240 hour(s)).    Anti-infectives    None       Radiology Studies: Dg Chest 1  View  Result Date: 10/12/2017 CLINICAL DATA:  Fall.  Confusion. EXAM: CHEST 1 VIEW COMPARISON:  04/19/2013 FINDINGS: Surgical clips about the right breast and axilla. Midline trachea. Moderate cardiomegaly. Atherosclerosis in the transverse aorta. No pleural effusion or pneumothorax. No congestive failure. Clear lungs. IMPRESSION: Cardiomegaly, without congestive failure. Aortic Atherosclerosis (ICD10-I70.0). Electronically Signed   By: Abigail Miyamoto M.D.   On: 10/12/2017 14:32   Dg Thoracic Spine 2 View  Result Date: 10/12/2017 CLINICAL DATA:  Status post fall, with mid back pain. Initial encounter. EXAM: THORACIC SPINE 2 VIEWS COMPARISON:  Chest radiograph performed earlier today at 1:43  p.m., and CTA of the chest performed 04/21/2011 FINDINGS: There is no evidence of fracture or subluxation. Vertebral bodies demonstrate normal height and alignment. Mild degenerative change is noted along the upper lumbar spine. The visualized portions of both lungs are clear. The mediastinum is unremarkable in appearance. Scattered calcification is seen along the proximal abdominal aorta. IMPRESSION: 1. No evidence of fracture or subluxation along the thoracic spine. 2. Scattered aortic atherosclerosis. Electronically Signed   By: Garald Balding M.D.   On: 10/12/2017 21:27   Dg Lumbar Spine Complete  Result Date: 10/12/2017 CLINICAL DATA:  Fall.  Confusion. EXAM: LUMBAR SPINE - COMPLETE 4+ VIEW COMPARISON:  08/03/2012 FINDINGS: Extensive overlying bowel gas, degrading the frontal radiograph. Sacroiliac joints are grossly symmetric. Large amount of stool in the rectum. Abdominal aortic atherosclerosis. The lateral views are suboptimal secondary to technique. The elbow overlies the upper lumbar spine. No gross vertebral body height loss. Straightening of expected lordosis. Spondylosis is age expected. IMPRESSION: Moderately degraded exam secondary to positioning. No gross acute osseous abnormality identified.  Nonspecific straightening of expected lordosis. Aortic Atherosclerosis (ICD10-I70.0). Possible constipation. Electronically Signed   By: Abigail Miyamoto M.D.   On: 10/12/2017 14:34   Ct Head Wo Contrast  Result Date: 10/12/2017 CLINICAL DATA:  Altered level of consciousness. Patient presents to the ED from home with complaints of fall. Patient reports trip over plant. Patient reports has been on the floor all night until daughter found her this morning. EXAM: CT HEAD WITHOUT CONTRAST TECHNIQUE: Contiguous axial images were obtained from the base of the skull through the vertex without intravenous contrast. COMPARISON:  06/19/2016 FINDINGS: Brain: No evidence of acute infarction, hemorrhage, extra-axial collection, ventriculomegaly, or mass effect. Generalized cerebral atrophy. Periventricular white matter low attenuation likely secondary to microangiopathy. Vascular: Cerebrovascular atherosclerotic calcifications are noted. Skull: Negative for fracture or focal lesion. Sinuses/Orbits: Visualized portions of the orbits are unremarkable. Mastoid sinuses are clear. Small air-fluid level in the left maxillary sinus. Other: None. IMPRESSION: 1. No acute intracranial pathology. 2. Chronic microvascular disease and cerebral atrophy. Electronically Signed   By: Kathreen Devoid   On: 10/12/2017 16:11   Ct Cervical Spine Wo Contrast  Result Date: 10/12/2017 CLINICAL DATA:  Cervical spine trauma, high clinical risk EXAM: CT CERVICAL SPINE WITHOUT CONTRAST TECHNIQUE: Multidetector CT imaging of the cervical spine was performed without intravenous contrast. Multiplanar CT image reconstructions were also generated. COMPARISON:  06/19/2016 FINDINGS: Alignment: Mild anterolisthesis C4-5 and C5-6 Skull base and vertebrae: Negative for fracture Soft tissues and spinal canal: Extensive atherosclerotic calcification the carotid artery. Bilateral thyroid nodules. Negative for mass or adenopathy Disc levels: Extensive facet  degeneration throughout the cervical spine bilaterally, right greater than left. Disc degeneration and uncinate spurring C5-6 and C6-7 with mild spinal stenosis. Upper chest: Negative Other: None IMPRESSION: Negative for cervical spine fracture. Moderate degenerative changes in the cervical spine. Electronically Signed   By: Franchot Gallo M.D.   On: 10/12/2017 18:55   Ct Lumbar Spine Wo Contrast  Result Date: 10/12/2017 CLINICAL DATA:  Initial evaluation for acute back pain, fall. EXAM: CT LUMBAR SPINE WITHOUT CONTRAST TECHNIQUE: Multidetector CT imaging of the lumbar spine was performed without intravenous contrast administration. Multiplanar CT image reconstructions were also generated. COMPARISON:  Comparison made with prior radiograph from earlier the same day. FINDINGS: Segmentation: Normal segmentation. Lowest well-formed disc labeled the L5-S1 level. Alignment: Straightening of the normal lumbar lordosis with trace dextroscoliosis. No listhesis. Vertebrae: Vertebral body heights are maintained. No evidence for acute or  chronic fracture. No discrete lytic or blastic osseous lesions. Partially visualized sacrum and pelvis are intact. SI joints approximated and symmetric. L3, L4, and L5 vertebral bodies are partially ankylosed. Paraspinal and other soft tissues: Paraspinous soft tissues demonstrate no acute abnormality. Visualized lungs are clear. Cardiomegaly partially visualized. Extensive aortic atherosclerosis. Visualized urinary bladder is markedly distended. Disc levels: L1-2: Mild circumferential disc bulge with intervertebral disc space narrowing. Moderate facet and ligamentum flavum hypertrophy. No significant stenosis. L2-3: Mild diffuse disc bulge with intervertebral disc space narrowing and disc desiccation. Moderate facet and ligamentum flavum hypertrophy. No significant stenosis. L3-4: Ankylosis of the L3 and L4 vertebral bodies with loss of L3-4 disc space. Moderate facet hypertrophy. No  canal stenosis. Mild bilateral L3 foraminal narrowing. L4-5: Ankylosis of the L4 and L5 vertebral bodies with loss of L4-5 disc space. Mild facet hypertrophy. No significant stenosis. L5-S1: Diffuse disc bulge with disc desiccation and intervertebral disc space narrowing. Left far lateral endplate osteophytic spurring. Mild to moderate facet arthrosis. No significant canal stenosis. Moderate to advanced bilateral L5 foraminal narrowing, left worse than right. IMPRESSION: 1. No acute traumatic injury within the lumbar spine. 2. Straightening of the normal lumbar lordosis with multilevel degenerative spondylolysis and facet arthrosis as above. No significant canal stenosis. Moderate to advanced bilateral L5 foraminal narrowing. 3. Cardiomegaly with advanced aortic atherosclerosis. 4. Marked dilatation of the partially visualized urinary bladder. Correlation with urinary output suggested. Electronically Signed   By: Jeannine Boga M.D.   On: 10/12/2017 16:17   Dg Knee Complete 4 Views Left  Result Date: 10/12/2017 CLINICAL DATA:  Fall today.  Left knee pain.  Initial encounter. EXAM: LEFT KNEE - COMPLETE 4+ VIEW COMPARISON:  None. FINDINGS: No evidence of fracture, dislocation, or joint effusion. No evidence of arthropathy or other focal bone abnormality. Mild patellar enthesopathy noted. Peripheral vascular calcification also seen. IMPRESSION: No acute findings. Electronically Signed   By: Earle Gell M.D.   On: 10/12/2017 14:38   Dg Hip Unilat W Or Wo Pelvis 2-3 Views Left  Result Date: 10/12/2017 CLINICAL DATA:  Fall.  Confusion. EXAM: DG HIP (WITH OR WITHOUT PELVIS) 2-3V LEFT COMPARISON:  None. FINDINGS: AP view pelvis and AP/frog leg views of the left hip. Femoral heads are located. Vascular calcifications. Femoral heads are located. No acute fracture. Large amount of stool in the rectum. Joint spaces are maintained for age. Degenerate disc disease involves the lumbosacral junction. IMPRESSION: No  acute osseous abnormality. Electronically Signed   By: Abigail Miyamoto M.D.   On: 10/12/2017 14:38        Scheduled Meds: . apixaban  2.5 mg Oral BID  . calcium-vitamin D  1 tablet Oral Q breakfast  . cholecalciferol  1,000 Units Oral Daily  . ferrous sulfate  325 mg Oral Q breakfast  . gabapentin  100 mg Oral QHS  . insulin aspart  0-9 Units Subcutaneous TID WC  . irbesartan  150 mg Oral Daily  . pravastatin  40 mg Oral QHS  . vitamin B-12  1,000 mcg Oral BID   Continuous Infusions: . sodium chloride 125 mL/hr at 10/13/17 0236     LOS: 0 days    Time spent: 50 min    [place], DO [facility] Pager [phone]  If 7PM-7AM, please contact night-coverage [email or web address] Password [email or web address] 10/13/2017, 12:11 PM

## 2017-10-13 NOTE — Progress Notes (Signed)
In and out catheterization performed at bedside 5w13 for urinary retention. Henry Russel., RN assisting. Sterile technique was maintained at all times, 700 cc of urine collected. Pt tolerated well. Urine was yellow, clear, no odor.

## 2017-10-13 NOTE — Progress Notes (Signed)
In and Out catheterization performed at bedside with Donna Owens. RN assisting. Sterile technique maintained throughout procedure. Pt tolerated well. 550 ml urine collected; urine was yellow, clear, no odor. Pt resting at this time.

## 2017-10-13 NOTE — Progress Notes (Signed)
Pt arrived to unit and identified appropriately. Placed in low bed with call bell within reach and oriented to unit. Assessed pt, BP 189/78, O2 100, Temperature 98.5, P 75. NP on call was notified and new orders obtained. Stage 2 pressure ulcer found on sacrum, sacral foam placed. Placed on telemetry box 5w14 and continuous pulse oximetry.  Pt resting, will continue to monitor.

## 2017-10-13 NOTE — Progress Notes (Signed)
Physical Therapy Evaluation Patient Details Name: Donna Owens MRN: [id number] DOB: March 02, 1928 Today's Date: 10/13/2017   History of Present Illness  83 y.o. female with medical history significant of A.Fib, CVA, DM2, HTN.  Patient presents to the ED after a fall at home. Imaging negative for fractures.   Clinical Impression  Pt admitted with above diagnosis. Pt currently with functional limitations due to the deficits listed below (see PT Problem List). Pt ambulated 68' with RW, distance limited by incontinence of bowel. 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 ST-SNF recommended.   Pt will benefit from skilled PT to increase their independence and safety with mobility to allow discharge to the venue listed below.       Follow Up Recommendations SNF;Supervision for mobility/OOB (due to h/o multiple falls and unsafe home environment per grandaughter)    Equipment Recommendations  None recommended by PT    Recommendations for Other Services       Precautions / Restrictions Precautions Precautions: Fall Precaution Comments: pt reports 6-7 falls in past 1 year Restrictions Weight Bearing Restrictions: No      Mobility  Bed Mobility               General bed mobility comments: up in recliner  Transfers Overall transfer level: Needs assistance Equipment used: Rolling walker (2 wheeled) Transfers: Sit to/from Stand Sit to Stand: Mod assist         General transfer comment: assist to rise from recliner, VCs for hand placement, increased time/effort, pt repeatedly stated she wasn't ready to stand but eventually did stand with encouragement  Ambulation/Gait Ambulation/Gait assistance: Min guard Ambulation Distance (Feet): 16 Feet Assistive device: Rolling walker (2 wheeled) Gait Pattern/deviations: Step-through pattern;Decreased stride length     General Gait Details: distance limited by  incontinence of bowel while walking, assisted pt to bathroom, NT came in room to assist pt with cleanup, no LOB with walking  Stairs            Wheelchair Mobility    Modified Rankin (Stroke Patients Only)       Balance Overall balance assessment: History of Falls;Needs assistance   Sitting balance-Leahy Scale: Good     Standing balance support: Bilateral upper extremity supported Standing balance-Leahy Scale: Poor Standing balance comment: requires BUE support                             Pertinent Vitals/Pain Pain Assessment: 0-10 Pain Score: 9  Pain Location: R heel Pain Descriptors / Indicators: Sore Pain Intervention(s): Limited activity within patient's tolerance;Monitored during session;Premedicated before session    Home Living Family/patient expects to be discharged to:: Private residence Living Arrangements: Alone Available Help at Discharge: Family;Available PRN/intermittently Type of Home: House Home Access: Stairs to enter   Entrance Stairs-Number of Steps: 5 Home Layout: One level Home Equipment: Walker - 2 wheels;Cane - single point;Shower seat      Prior Function Level of Independence: Needs assistance   Gait / Transfers Assistance Needed: walks with [facility]  ADL's / Homemaking Assistance Needed: Per granddaughter pt's daughter comes to supervise bathing in tub. Pt reports that she is independent with all ADLs and IADLs except driving.  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     Hand Dominance   Dominant Hand: Right  Extremity/Trunk Assessment   Upper Extremity Assessment Upper Extremity Assessment: Defer to OT evaluation    Lower Extremity Assessment Lower Extremity Assessment: Overall WFL for tasks assessed    Cervical / Trunk Assessment Cervical / Trunk Assessment: Normal  Communication   Communication: No  difficulties  Cognition Arousal/Alertness: Awake/alert Behavior During Therapy: WFL for tasks assessed/performed Overall Cognitive Status: Impaired/Different from baseline Area of Impairment: Memory;Orientation;Safety/judgement                               General Comments: pt stated birth year was "[birth year]" twice; per grandaughter and MD pt is reporting possibly hallucinations (seeing floor move up and down)      General Comments      Exercises     Assessment/Plan    PT Assessment Patient needs continued PT services  PT Problem List Decreased balance;Decreased activity tolerance;Decreased mobility;Pain       PT Treatment Interventions Gait training;Functional mobility training;Stair training;Balance training;Therapeutic exercise;Patient/family education;Therapeutic activities    PT Goals (Current goals can be found in the Care Plan section)  Acute Rehab PT Goals Patient Stated Goal: to return home PT Goal Formulation: With patient/family Time For Goal Achievement: 10/27/17 Potential to Achieve Goals: Fair    Frequency Min 3X/week   Barriers to discharge        Co-evaluation               AM-PAC PT "6 Clicks" Daily Activity  Outcome Measure Difficulty turning over in bed (including adjusting bedclothes, sheets and blankets)?: A Little Difficulty moving from lying on back to sitting on the side of the bed? : Unable Difficulty sitting down on and standing up from a chair with arms (e.g., wheelchair, bedside commode, etc,.)?: Unable Help needed moving to and from a bed to chair (including a wheelchair)?: A Lot Help needed walking in hospital room?: A Little Help needed climbing 3-5 steps with a railing? : A Lot 6 Click Score: 12    End of Session Equipment Utilized During Treatment: Gait belt Activity Tolerance: Patient tolerated treatment well Patient left: with call bell/phone within reach;with nursing/sitter in room (on commode, NT in room) Nurse  Communication: Mobility status PT Visit Diagnosis: Repeated falls (R29.6);Difficulty in walking, not elsewhere classified (R26.2);Pain Pain - Right/Left: Right Pain - part of body: Ankle and joints of foot    Time: 9449-6759 PT Time Calculation (min) (ACUTE ONLY): 27 min   Charges:   PT Evaluation $PT Eval Moderate Complexity: 1 Mod PT Treatments $Gait Training: 8-22 mins   PT G Codes:   PT G-Codes **NOT FOR INPATIENT CLASS** Functional Assessment Tool Used: AM-PAC 6 Clicks Basic Mobility Functional Limitation: Mobility: Walking and moving around Mobility: Walking and Moving Around Current Status (F6384): At least 60 percent but less than 80 percent impaired, limited or restricted Mobility: Walking and Moving Around Goal Status [phone]): At least 20 percent but less than 40 percent impaired, limited or restricted      Philomena Doheny 10/13/2017, 10:22 AM [phone]

## 2017-10-13 NOTE — Progress Notes (Signed)
CRITICAL VALUE ALERT  Critical Value:  Troponin .30  Date & Time Notified: 10/13/2017 04:35  Provider Notified: Schorr, NP  Orders Received/Actions taken: No new orders at this time.

## 2017-10-13 NOTE — Clinical Social Work Note (Signed)
CSW acknowledges SNF consult. Patient currently Medicare obs. Will follow in case patient is switched to inpatient and is in the hospital for 3 days or more under that status.  Dayton Scrape, [place]

## 2017-10-13 NOTE — Consult Note (Signed)
[facility] Nurse wound consult note Reason for Consult: Consult requested for sacrum Wound type: Stage 2 pressure injury Pressure Injury POA: Yes Measurement: .8X.2X.1cm Wound bed: red and moist Drainage (amount, consistency, odor) no odor or drainage Periwound: Intact skin surrounding Dressing procedure/placement/frequency: Foam dressing to protect and promote healing. Please re-consult if further assistance is needed.  Thank-you,  Julien Girt MSN, [place], [place], [place], [place]

## 2017-10-14 ENCOUNTER — Encounter (HOSPITAL_COMMUNITY): Payer: Self-pay | Admitting: General Practice

## 2017-10-14 DIAGNOSIS — N390 Urinary tract infection, site not specified: Secondary | ICD-10-CM

## 2017-10-14 LAB — CBC
HCT: 35.4 % — ABNORMAL LOW (ref 36.0–46.0)
Hemoglobin: 11.9 g/dL — ABNORMAL LOW (ref 12.0–15.0)
MCH: 29.5 pg (ref 26.0–34.0)
MCHC: 33.6 g/dL (ref 30.0–36.0)
MCV: 87.8 fL (ref 78.0–100.0)
Platelets: 146 10*3/uL — ABNORMAL LOW (ref 150–400)
RBC: 4.03 MIL/uL (ref 3.87–5.11)
RDW: 14.2 % (ref 11.5–15.5)
WBC: 10.4 10*3/uL (ref 4.0–10.5)

## 2017-10-14 LAB — URINALYSIS, ROUTINE W REFLEX MICROSCOPIC
BILIRUBIN URINE: NEGATIVE
Glucose, UA: NEGATIVE mg/dL
Ketones, ur: NEGATIVE mg/dL
Leukocytes, UA: NEGATIVE
NITRITE: POSITIVE — AB
Protein, ur: 30 mg/dL — AB
SPECIFIC GRAVITY, URINE: 1.017 (ref 1.005–1.030)
SQUAMOUS EPITHELIAL / LPF: NONE SEEN
pH: 5 (ref 5.0–8.0)

## 2017-10-14 LAB — BASIC METABOLIC PANEL
Anion gap: 7 (ref 5–15)
BUN: 24 mg/dL — ABNORMAL HIGH (ref 6–20)
CO2: 21 mmol/L — ABNORMAL LOW (ref 22–32)
Calcium: 8.3 mg/dL — ABNORMAL LOW (ref 8.9–10.3)
Chloride: 109 mmol/L (ref 101–111)
Creatinine, Ser: 1.11 mg/dL — ABNORMAL HIGH (ref 0.44–1.00)
GFR calc Af Amer: 50 mL/min — ABNORMAL LOW (ref >60)
GFR calc non Af Amer: 43 mL/min — ABNORMAL LOW (ref >60)
Glucose, Bld: 127 mg/dL — ABNORMAL HIGH (ref 65–99)
Potassium: 3.6 mmol/L (ref 3.5–5.1)
Sodium: 137 mmol/L (ref 135–145)

## 2017-10-14 LAB — GLUCOSE, CAPILLARY
GLUCOSE-CAPILLARY: 137 mg/dL — AB (ref 65–99)
Glucose-Capillary: 128 mg/dL — ABNORMAL HIGH (ref 65–99)
Glucose-Capillary: 144 mg/dL — ABNORMAL HIGH (ref 65–99)

## 2017-10-14 LAB — CK: Total CK: 1579 U/L — ABNORMAL HIGH (ref 38–234)

## 2017-10-14 MED ORDER — HYDROCODONE-ACETAMINOPHEN 5-325 MG PO TABS
1.0000 | ORAL_TABLET | Freq: Four times a day (QID) | ORAL | Status: DC | PRN
  Administered 2017-10-14 – 2017-10-17 (×7): 1 via ORAL
  Filled 2017-10-14 (×7): qty 1

## 2017-10-14 MED ORDER — DEXTROSE 5 % IV SOLN
1.0000 g | INTRAVENOUS | Status: DC
  Administered 2017-10-14 – 2017-10-15 (×2): 1 g via INTRAVENOUS
  Filled 2017-10-14 (×3): qty 10

## 2017-10-14 MED ORDER — LIDOCAINE 5 % EX PTCH
1.0000 | MEDICATED_PATCH | CUTANEOUS | Status: DC
  Administered 2017-10-14 – 2017-10-17 (×4): 1 via TRANSDERMAL
  Filled 2017-10-14 (×4): qty 1

## 2017-10-14 NOTE — Progress Notes (Signed)
Pt has new onset of malodorous urine this morning, UA collected per doctor's order, came back positive. Pt urinating, but unable to completely empty bladder. Eliseo Squires, MD notified. Pt asymptomatic, no change in VS and mental status.

## 2017-10-14 NOTE — Progress Notes (Signed)
PROGRESS NOTE    Donna Owens  UVO:[id number] DOB: 1928/07/24 DOA: 10/12/2017 PCP: Leanna Battles, MD   Outpatient Specialists:     Brief Narrative:  Donna Owens is a 83 y.o. female with medical history significant of A.Fib, CVA, DM2, HTN.  Patient presents to the ED after a fall at home.  LKW 630pm last night per daughter.  Patient didn't answer phone this AM, daughter went to check on her found patient on floor.  Several things in house knocked over.  Patient doesn't have any recollection of events.  Complaining of pain to low back and L leg.  ED Course: CT head, neck, L spine neg, X rays of L knee, L hip and pelvis, CXR all neg other than some questionable constipation (patient then has large BM in ED), and bladder distention (patient does appear to have urinary retention).   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  UTI present on admission  -rocephin  -culture pending  Rhabdomyolysis   -follow CKS  -recheck in AM  Urinary retention - -PVR of > 500 -foley placed -will need urology follow up  Back pain - Imaging negative -add lidocaine patch  Confusion - with h/o Amnestic MCI CT head negative -B12/TSH/ammonia normal -mental status much improved  Elevated troponin - Flat No chest pain  HTN - continue home meds  DM2 - Sensitive SSI AC Hold metformin  Stage 2 pressure ulcer  A.Fib - Resume eliquis tomorrow since no apparent major injury    DVT prophylaxis:  Fully anticoagulated   Code Status: Full Code   Family Communication: None at  bedside  Disposition Plan:     Consultants:      Subjective: C/o low back pain  Objective: Vitals:   10/13/17 1502 10/13/17 2209 10/14/17 0638 10/14/17 0658  BP: (!) 153/57 (!) 139/55 (!) 155/71 (!) 155/71  Pulse: 89 68 74 74  Resp: 16 17 17 17   Temp: 98.8 F (37.1 C) 99.7 F (37.6 C) 98.7 F (37.1 C) 98.7 F (37.1 C)  TempSrc: Oral Oral Oral Oral  SpO2: 95% 97% 97% 97%  Weight:      Height:        Intake/Output Summary (Last 24 hours) at 10/14/17 1426 Last data filed at 10/14/17 1100  Gross per 24 hour  Intake              120 ml  Output             1500 ml  Net            -1380 ml   Filed Weights   10/13/17 1200  Weight: 62.1 kg (137 lb)    Examination:  General exam: NAD- more interactive, oriented to person, place and time (trump) Respiratory system: Clear to auscultation. Respiratory effort normal. Cardiovascular system: irr Gastrointestinal system: +BS, soft Extremities: getting up with OT- moves all 4 ext     Data Reviewed: I have personally reviewed following labs and imaging studies  CBC:  Recent Labs Lab 10/12/17 1446 10/13/17  5784 10/14/17 0606  WBC 16.2* 12.1* 10.4  NEUTROABS 14.0*  --   --   HGB 15.3* 12.0 11.9*  HCT 43.6 35.7* 35.4*  MCV 87.2 86.9 87.8  PLT 199 176 696*   Basic Metabolic Panel:  Recent Labs Lab 10/12/17 1446 10/13/17 0712 10/14/17 0606  NA 135 134* 137  K 4.0 3.5 3.6  CL 103 109 109  CO2 22 19* 21*  GLUCOSE 153* 83 127*  BUN 35* 28* 24*  CREATININE 1.33* 1.14* 1.11*  CALCIUM 9.5 8.1* 8.3*   GFR: Estimated Creatinine Clearance: 32.1 mL/min (A) (by C-G formula based on SCr of 1.11 mg/dL (H)). Liver Function Tests:  Recent Labs Lab 10/12/17 1446  AST 117*  ALT 47  ALKPHOS 101  BILITOT 1.4*  PROT 8.5*  ALBUMIN 3.7   No results for input(s): LIPASE, AMYLASE in the last 168 hours.  Recent Labs Lab 10/13/17 1405  AMMONIA 9   Coagulation Profile: No results for input(s): INR, PROTIME in  the last 168 hours. Cardiac Enzymes:  Recent Labs Lab 10/12/17 1446 10/12/17 2146 10/13/17 0203 10/13/17 0712 10/14/17 0606  CKTOTAL 3,668*  --  2,212*  --  1,579*  TROPONINI 0.30* 0.25* 0.30* 0.21*  --    BNP (last 3 results) No results for input(s): PROBNP in the last 8760 hours. HbA1C: No results for input(s): HGBA1C in the last 72 hours. CBG:  Recent Labs Lab 10/13/17 1218 10/13/17 1650 10/13/17 2209 10/14/17 0801 10/14/17 1222  GLUCAP 118* 189* 147* 128* 137*   Lipid Profile: No results for input(s): CHOL, HDL, LDLCALC, TRIG, CHOLHDL, LDLDIRECT in the last 72 hours. Thyroid Function Tests:  Recent Labs  10/13/17 1405  TSH 0.437   Anemia Panel:  Recent Labs  10/13/17 1405  VITAMINB12 3,005*   Urine analysis:    Component Value Date/Time   COLORURINE YELLOW 10/14/2017 0544   APPEARANCEUR HAZY (A) 10/14/2017 0544   APPEARANCEUR Slightly cloudy 06/21/2015 1358   LABSPEC 1.017 10/14/2017 0544   PHURINE 5.0 10/14/2017 0544   GLUCOSEU NEGATIVE 10/14/2017 0544   HGBUR SMALL (A) 10/14/2017 0544   BILIRUBINUR NEGATIVE 10/14/2017 0544   BILIRUBINUR Negative 06/21/2015 1358   KETONESUR NEGATIVE 10/14/2017 0544   PROTEINUR 30 (A) 10/14/2017 0544   UROBILINOGEN 0.2 04/18/2015 1330   NITRITE POSITIVE (A) 10/14/2017 0544   LEUKOCYTESUR NEGATIVE 10/14/2017 0544   LEUKOCYTESUR 2+ (A) 06/21/2015 1358     )No results found for this or any previous visit (from the past 240 hour(s)).    Anti-infectives    Start     Dose/Rate Route Frequency Ordered Stop   10/14/17 0800  cefTRIAXone (ROCEPHIN) 1 g in dextrose 5 % 50 mL IVPB     1 g 100 mL/hr over 30 Minutes Intravenous Every 24 hours 10/14/17 0743         Radiology Studies: Dg Thoracic Spine 2 View  Result Date: 10/12/2017 CLINICAL DATA:  Status post fall, with mid back pain. Initial encounter. EXAM: THORACIC SPINE 2 VIEWS COMPARISON:  Chest radiograph performed earlier today at 1:43 p.m., and CTA of the  chest performed 04/21/2011 FINDINGS: There is no evidence of fracture or subluxation. Vertebral bodies demonstrate normal height and alignment. Mild degenerative change is noted along the upper lumbar spine. The visualized portions of both lungs are clear. The mediastinum is unremarkable in appearance. Scattered calcification is seen along the proximal abdominal aorta. IMPRESSION: 1. No evidence of fracture or subluxation along the thoracic spine. 2. Scattered aortic atherosclerosis. Electronically Signed  By: Garald Balding M.D.   On: 10/12/2017 21:27   Ct Head Wo Contrast  Result Date: 10/12/2017 CLINICAL DATA:  Altered level of consciousness. Patient presents to the ED from home with complaints of fall. Patient reports trip over plant. Patient reports has been on the floor all night until daughter found her this morning. EXAM: CT HEAD WITHOUT CONTRAST TECHNIQUE: Contiguous axial images were obtained from the base of the skull through the vertex without intravenous contrast. COMPARISON:  06/19/2016 FINDINGS: Brain: No evidence of acute infarction, hemorrhage, extra-axial collection, ventriculomegaly, or mass effect. Generalized cerebral atrophy. Periventricular white matter low attenuation likely secondary to microangiopathy. Vascular: Cerebrovascular atherosclerotic calcifications are noted. Skull: Negative for fracture or focal lesion. Sinuses/Orbits: Visualized portions of the orbits are unremarkable. Mastoid sinuses are clear. Small air-fluid level in the left maxillary sinus. Other: None. IMPRESSION: 1. No acute intracranial pathology. 2. Chronic microvascular disease and cerebral atrophy. Electronically Signed   By: Kathreen Devoid   On: 10/12/2017 16:11   Ct Cervical Spine Wo Contrast  Result Date: 10/12/2017 CLINICAL DATA:  Cervical spine trauma, high clinical risk EXAM: CT CERVICAL SPINE WITHOUT CONTRAST TECHNIQUE: Multidetector CT imaging of the cervical spine was performed without intravenous  contrast. Multiplanar CT image reconstructions were also generated. COMPARISON:  06/19/2016 FINDINGS: Alignment: Mild anterolisthesis C4-5 and C5-6 Skull base and vertebrae: Negative for fracture Soft tissues and spinal canal: Extensive atherosclerotic calcification the carotid artery. Bilateral thyroid nodules. Negative for mass or adenopathy Disc levels: Extensive facet degeneration throughout the cervical spine bilaterally, right greater than left. Disc degeneration and uncinate spurring C5-6 and C6-7 with mild spinal stenosis. Upper chest: Negative Other: None IMPRESSION: Negative for cervical spine fracture. Moderate degenerative changes in the cervical spine. Electronically Signed   By: Franchot Gallo M.D.   On: 10/12/2017 18:55   Ct Lumbar Spine Wo Contrast  Result Date: 10/12/2017 CLINICAL DATA:  Initial evaluation for acute back pain, fall. EXAM: CT LUMBAR SPINE WITHOUT CONTRAST TECHNIQUE: Multidetector CT imaging of the lumbar spine was performed without intravenous contrast administration. Multiplanar CT image reconstructions were also generated. COMPARISON:  Comparison made with prior radiograph from earlier the same day. FINDINGS: Segmentation: Normal segmentation. Lowest well-formed disc labeled the L5-S1 level. Alignment: Straightening of the normal lumbar lordosis with trace dextroscoliosis. No listhesis. Vertebrae: Vertebral body heights are maintained. No evidence for acute or chronic fracture. No discrete lytic or blastic osseous lesions. Partially visualized sacrum and pelvis are intact. SI joints approximated and symmetric. L3, L4, and L5 vertebral bodies are partially ankylosed. Paraspinal and other soft tissues: Paraspinous soft tissues demonstrate no acute abnormality. Visualized lungs are clear. Cardiomegaly partially visualized. Extensive aortic atherosclerosis. Visualized urinary bladder is markedly distended. Disc levels: L1-2: Mild circumferential disc bulge with intervertebral disc  space narrowing. Moderate facet and ligamentum flavum hypertrophy. No significant stenosis. L2-3: Mild diffuse disc bulge with intervertebral disc space narrowing and disc desiccation. Moderate facet and ligamentum flavum hypertrophy. No significant stenosis. L3-4: Ankylosis of the L3 and L4 vertebral bodies with loss of L3-4 disc space. Moderate facet hypertrophy. No canal stenosis. Mild bilateral L3 foraminal narrowing. L4-5: Ankylosis of the L4 and L5 vertebral bodies with loss of L4-5 disc space. Mild facet hypertrophy. No significant stenosis. L5-S1: Diffuse disc bulge with disc desiccation and intervertebral disc space narrowing. Left far lateral endplate osteophytic spurring. Mild to moderate facet arthrosis. No significant canal stenosis. Moderate to advanced bilateral L5 foraminal narrowing, left worse than right. IMPRESSION: 1. No acute traumatic injury within  the lumbar spine. 2. Straightening of the normal lumbar lordosis with multilevel degenerative spondylolysis and facet arthrosis as above. No significant canal stenosis. Moderate to advanced bilateral L5 foraminal narrowing. 3. Cardiomegaly with advanced aortic atherosclerosis. 4. Marked dilatation of the partially visualized urinary bladder. Correlation with urinary output suggested. Electronically Signed   By: Jeannine Boga M.D.   On: 10/12/2017 16:17        Scheduled Meds: . apixaban  2.5 mg Oral BID  . calcium-vitamin D  1 tablet Oral Q breakfast  . cholecalciferol  1,000 Units Oral Daily  . ferrous sulfate  325 mg Oral Q breakfast  . gabapentin  100 mg Oral QHS  . insulin aspart  0-9 Units Subcutaneous TID WC  . irbesartan  150 mg Oral Daily  . lidocaine  1 patch Transdermal Q24H  . pravastatin  40 mg Oral QHS  . vitamin B-12  1,000 mcg Oral BID   Continuous Infusions: . cefTRIAXone (ROCEPHIN)  IV Stopped (10/14/17 1007)     LOS: 1 day    Time spent: 62 min    [place], DO [facility] Pager  [phone]  If 7PM-7AM, please contact night-coverage [email or web address] Password [email or web address] 10/14/2017, 2:26 PM

## 2017-10-14 NOTE — Progress Notes (Signed)
Bladder scan showed 682 ml of urine in bladder, pt urinated 200 ml, post void residual showed 604 ml of urine. In and out catheterization completed at the bedside 5w13, perineal care completed, sterile technique maintained during procedure. Catheterization performed by Osker Mason S present during procedure. Collected 700 of clear malodorous urine, pt tolerated procedure well. MD notified, verbal order for UA obtained and sent to lab. Will continue to monitor and treat pt per MD orders.

## 2017-10-15 LAB — BASIC METABOLIC PANEL
ANION GAP: 7 (ref 5–15)
BUN: 15 mg/dL (ref 6–20)
CALCIUM: 9 mg/dL (ref 8.9–10.3)
CO2: 25 mmol/L (ref 22–32)
Chloride: 106 mmol/L (ref 101–111)
Creatinine, Ser: 1.1 mg/dL — ABNORMAL HIGH (ref 0.44–1.00)
GFR calc Af Amer: 51 mL/min — ABNORMAL LOW (ref >60)
GFR, EST NON AFRICAN AMERICAN: 44 mL/min — AB (ref >60)
Glucose, Bld: 134 mg/dL — ABNORMAL HIGH (ref 65–99)
POTASSIUM: 4.1 mmol/L (ref 3.5–5.1)
SODIUM: 138 mmol/L (ref 135–145)

## 2017-10-15 LAB — CBC
HCT: 37.7 % (ref 36.0–46.0)
Hemoglobin: 12.6 g/dL (ref 12.0–15.0)
MCH: 29.6 pg (ref 26.0–34.0)
MCHC: 33.4 g/dL (ref 30.0–36.0)
MCV: 88.7 fL (ref 78.0–100.0)
PLATELETS: 183 10*3/uL (ref 150–400)
RBC: 4.25 MIL/uL (ref 3.87–5.11)
RDW: 13.9 % (ref 11.5–15.5)
WBC: 11.9 10*3/uL — AB (ref 4.0–10.5)

## 2017-10-15 LAB — GLUCOSE, CAPILLARY
GLUCOSE-CAPILLARY: 108 mg/dL — AB (ref 65–99)
Glucose-Capillary: 188 mg/dL — ABNORMAL HIGH (ref 65–99)
Glucose-Capillary: 106 mg/dL — ABNORMAL HIGH (ref 65–99)
Glucose-Capillary: 203 mg/dL — ABNORMAL HIGH (ref 65–99)

## 2017-10-15 NOTE — Progress Notes (Signed)
Physical Therapy Treatment Patient Details Name: Donna Owens MRN: [id number] DOB: 1928/04/21 Today's Date: 10/15/2017    History of Present Illness 83 y.o. female with medical history significant of A.Fib, CVA, DM2, HTN.  Patient presents to the ED after a fall at home. Imaging negative for fractures.     PT Comments    Pt making slow progress with mobility. She continues to require assistance for safety with mobility and would benefit from further intensive therapy services prior to returning home. Pt would continue to benefit from skilled physical therapy services at this time while admitted and after d/c to address the below listed limitations in order to improve overall safety and independence with functional mobility.    Follow Up Recommendations  SNF     Equipment Recommendations  None recommended by PT    Recommendations for Other Services       Precautions / Restrictions Precautions Precautions: Fall Precaution Comments: pt reports 6-7 falls in past 1 year Restrictions Weight Bearing Restrictions: No    Mobility  Bed Mobility Overal bed mobility: Needs Assistance Bed Mobility: Supine to Sit     Supine to sit: Mod assist     General bed mobility comments: assist to elevate trunk  Transfers Overall transfer level: Needs assistance Equipment used: Rolling walker (2 wheeled) Transfers: Sit to/from Stand Sit to Stand: Min guard         General transfer comment: increased time, min guard for safety  Ambulation/Gait Ambulation/Gait assistance: Min assist Ambulation Distance (Feet): 30 Feet (30' x2 with sitting rest break to toilet) Assistive device: Rolling walker (2 wheeled) Gait Pattern/deviations: Step-through pattern;Decreased stride length Gait velocity: decreased Gait velocity interpretation: <1.8 ft/sec, indicative of risk for recurrent falls General Gait Details: pt with modest instability and requiring constant tactile cueing and min A for  safety with RW   Stairs            Wheelchair Mobility    Modified Rankin (Stroke Patients Only)       Balance Overall balance assessment: History of Falls;Needs assistance Sitting-balance support: Feet supported Sitting balance-Leahy Scale: Good     Standing balance support: Bilateral upper extremity supported Standing balance-Leahy Scale: Poor Standing balance comment: requires BUE support                            Cognition Arousal/Alertness: Awake/alert Behavior During Therapy: WFL for tasks assessed/performed Overall Cognitive Status: Impaired/Different from baseline Area of Impairment: Memory;Following commands;Safety/judgement;Problem solving                     Memory: Decreased short-term memory Following Commands: Follows one step commands with increased time Safety/Judgement: Decreased awareness of safety;Decreased awareness of deficits   Problem Solving: Slow processing;Difficulty sequencing;Requires verbal cues;Requires tactile cues        Exercises      General Comments        Pertinent Vitals/Pain Pain Assessment: No/denies pain    Home Living                      Prior Function            PT Goals (current goals can now be found in the care plan section) Acute Rehab PT Goals PT Goal Formulation: With patient/family Time For Goal Achievement: 10/27/17 Potential to Achieve Goals: Fair Progress towards PT goals: Progressing toward goals    Frequency  PT Plan Current plan remains appropriate    Co-evaluation              AM-PAC PT "6 Clicks" Daily Activity  Outcome Measure  Difficulty turning over in bed (including adjusting bedclothes, sheets and blankets)?: A Lot Difficulty moving from lying on back to sitting on the side of the bed? : Unable Difficulty sitting down on and standing up from a chair with arms (e.g., wheelchair, bedside commode, etc,.)?: Unable Help needed moving  to and from a bed to chair (including a wheelchair)?: A Little Help needed walking in hospital room?: A Little Help needed climbing 3-5 steps with a railing? : A Lot 6 Click Score: 12    End of Session Equipment Utilized During Treatment: Gait belt Activity Tolerance: Patient tolerated treatment well Patient left: in chair;with call bell/phone within reach;with chair alarm set Nurse Communication: Mobility status PT Visit Diagnosis: Repeated falls (R29.6);Difficulty in walking, not elsewhere classified (R26.2)     Time: 2637-8588 PT Time Calculation (min) (ACUTE ONLY): 23 min  Charges:  $Gait Training: 8-22 mins $Therapeutic Activity: 8-22 mins                    G Codes:       [place], [place], [place] 10/15/2017, 5:16 PM

## 2017-10-15 NOTE — Progress Notes (Signed)
PROGRESS NOTE    Donna Owens  ERD:[id number] DOB: [birth year] DOA: 10/12/2017 PCP: Leanna Battles, MD   Outpatient Specialists:     Brief Narrative:  Donna Owens is a 82 y.o. female with medical history significant of A.Fib, CVA, DM2, HTN.  Patient presents to the ED after a fall at home.  LKW 630pm last night per daughter.  Patient didn't answer phone this AM, daughter went to check on her found patient on floor.  Several things in house knocked over.  Patient doesn't have any recollection of events.  Complaining of pain to low back and L leg. Found to have UTI with urinary retention.   Assessment & Plan:   Principal Problem:   Acute urinary retention Active Problems:   Diabetes mellitus ([place])   Essential hypertension   Atrial fibrillation with slow ventricular response (HCC)   Amnestic MCI (mild cognitive impairment with memory loss)   Rhabdomyolysis   Elevated troponin   Confusion   Fall   Pressure injury of skin   Fall at home due to UTI  PT/OT re-eval for today  Patient prefers to go back home  UTI present on admission  -rocephin  -culture pending (ecoli-- done shortly after IV abx started)  Rhabdomyolysis   -follow [facility]  -recheck in AM  Urinary retention - -PVR of > 500 -foley placed -will need urology follow up in  Back pain - Imaging negative -add lidocaine patch -PRN norco  Confusion - with h/o Amnestic MCI CT head negative -B12/TSH/ammonia normal -mental status much improved with treatment of UTI  Elevated troponin - Flat No chest pain  HTN - continue home meds  DM2 - Sensitive SSI AC Hold metformin  Stage 2 pressure ulcer  A.Fib - Resume eliquis    DVT prophylaxis:  Fully anticoagulated   Code Status: Full Code   Family Communication: Called daughter  Disposition Plan:  PT eval- patient prefers to go back home if safe   Consultants:      Subjective: C/o low back pain  Objective: Vitals:   10/14/17  0658 10/14/17 1427 10/14/17 2116 10/15/17 0531  BP: (!) 155/71 (!) 144/65 (!) 155/75 (!) 162/59  Pulse: 74 78 71 70  Resp: 17 18 18 18   Temp: 98.7 F (37.1 C) 98.6 F (37 C) 99.3 F (37.4 C) 98.3 F (36.8 C)  TempSrc: Oral Oral Oral Oral  SpO2: 97% 99% 98% 100%  Weight:      Height:        Intake/Output Summary (Last 24 hours) at 10/15/17 1117 Last data filed at 10/15/17 0950  Gross per 24 hour  Intake              530 ml  Output             1950 ml  Net            -1420 ml   Filed Weights   10/13/17 1200  Weight: 62.1 kg (137 lb)    Examination:  General exam: NAD Respiratory system: clear Cardiovascular system: irr Gastrointestinal system: +Bs, soft Extremities: no edema     Data Reviewed: I have personally reviewed following labs and imaging studies  CBC:  Recent Labs Lab 10/12/17 1446 10/13/17 0712 10/14/17 0606 10/15/17 0529  WBC 16.2* 12.1* 10.4 11.9*  NEUTROABS 14.0*  --   --   --   HGB 15.3* 12.0 11.9* 12.6  HCT 43.6 35.7* 35.4* 37.7  MCV 87.2 86.9 87.8 88.7  PLT 199  176 146* 893   Basic Metabolic Panel:  Recent Labs Lab 10/12/17 1446 10/13/17 0712 10/14/17 0606 10/15/17 0529  NA 135 134* 137 138  K 4.0 3.5 3.6 4.1  CL 103 109 109 106  CO2 22 19* 21* 25  GLUCOSE 153* 83 127* 134*  BUN 35* 28* 24* 15  CREATININE 1.33* 1.14* 1.11* 1.10*  CALCIUM 9.5 8.1* 8.3* 9.0   GFR: Estimated Creatinine Clearance: 32.4 mL/min (A) (by C-G formula based on SCr of 1.1 mg/dL (H)). Liver Function Tests:  Recent Labs Lab 10/12/17 1446  AST 117*  ALT 47  ALKPHOS 101  BILITOT 1.4*  PROT 8.5*  ALBUMIN 3.7   No results for input(s): LIPASE, AMYLASE in the last 168 hours.  Recent Labs Lab 10/13/17 1405  AMMONIA 9   Coagulation Profile: No results for input(s): INR, PROTIME in the last 168 hours. Cardiac Enzymes:  Recent Labs Lab 10/12/17 1446 10/12/17 2146 10/13/17 0203 10/13/17 0712 10/14/17 0606  CKTOTAL 3,668*  --  2,212*  --   1,579*  TROPONINI 0.30* 0.25* 0.30* 0.21*  --    BNP (last 3 results) No results for input(s): PROBNP in the last 8760 hours. HbA1C: No results for input(s): HGBA1C in the last 72 hours. CBG:  Recent Labs Lab 10/13/17 2209 10/14/17 0801 10/14/17 1222 10/14/17 2117 10/15/17 0743  GLUCAP 147* 128* 137* 144* 108*   Lipid Profile: No results for input(s): CHOL, HDL, LDLCALC, TRIG, CHOLHDL, LDLDIRECT in the last 72 hours. Thyroid Function Tests:  Recent Labs  10/13/17 1405  TSH 0.437   Anemia Panel:  Recent Labs  10/13/17 1405  VITAMINB12 3,005*   Urine analysis:    Component Value Date/Time   COLORURINE YELLOW 10/14/2017 0544   APPEARANCEUR HAZY (A) 10/14/2017 0544   APPEARANCEUR Slightly cloudy 06/21/2015 1358   LABSPEC 1.017 10/14/2017 0544   PHURINE 5.0 10/14/2017 0544   GLUCOSEU NEGATIVE 10/14/2017 0544   HGBUR SMALL (A) 10/14/2017 0544   BILIRUBINUR NEGATIVE 10/14/2017 0544   BILIRUBINUR Negative 06/21/2015 1358   KETONESUR NEGATIVE 10/14/2017 0544   PROTEINUR 30 (A) 10/14/2017 0544   UROBILINOGEN 0.2 04/18/2015 1330   NITRITE POSITIVE (A) 10/14/2017 0544   LEUKOCYTESUR NEGATIVE 10/14/2017 0544   LEUKOCYTESUR 2+ (A) 06/21/2015 1358     ) Recent Results (from the past 240 hour(s))  Culture, Urine     Status: Abnormal (Preliminary result)   Collection Time: 10/14/17 11:05 AM  Result Value Ref Range Status   Specimen Description URINE, RANDOM  Final   Special Requests NONE  Final   Culture (A)  Final    80,000 COLONIES/mL ESCHERICHIA COLI SUSCEPTIBILITIES TO FOLLOW    Report Status PENDING  Incomplete      Anti-infectives    Start     Dose/Rate Route Frequency Ordered Stop   10/14/17 0800  cefTRIAXone (ROCEPHIN) 1 g in dextrose 5 % 50 mL IVPB     1 g 100 mL/hr over 30 Minutes Intravenous Every 24 hours 10/14/17 0743         Radiology Studies: No results found.      Scheduled Meds: . apixaban  2.5 mg Oral BID  . calcium-vitamin D  1  tablet Oral Q breakfast  . cholecalciferol  1,000 Units Oral Daily  . ferrous sulfate  325 mg Oral Q breakfast  . gabapentin  100 mg Oral QHS  . insulin aspart  0-9 Units Subcutaneous TID WC  . irbesartan  150 mg Oral Daily  . lidocaine  1 patch Transdermal Q24H  . pravastatin  40 mg Oral QHS  . vitamin B-12  1,000 mcg Oral BID   Continuous Infusions: . cefTRIAXone (ROCEPHIN)  IV Stopped (10/15/17 0841)     LOS: 2 days    Time spent: 25 min    [place], DO [facility] Pager [phone]  If 7PM-7AM, please contact night-coverage [email or web address] Password [email or web address] 10/15/2017, 11:17 AM

## 2017-10-15 NOTE — NC FL2 (Signed)
[place] LEVEL OF CARE SCREENING TOOL     IDENTIFICATION  Patient Name: Donna Owens Birthdate: [birth year] Sex: female Admission Date (Current Location): 10/12/2017  [facility] and [place] Number:  Herbalist and Address:  [facility] [place], [place] [address] [place], [place] [address]      Provider Number: [id number]  Attending Physician Name and Address:  Donna Girt, DO  Relative Name and Phone Number:  Donna, Owens granddaughter, [phone]    Current Level of Care: Hospital Recommended Level of Care: [place] Prior Approval Number:    Date Approved/Denied:   PASRR Number: [id number]  Discharge Plan: SNF    Current Diagnoses: Patient Active Problem List   Diagnosis Date Noted  . Pressure injury of skin 10/13/2017  . Rhabdomyolysis 10/12/2017  . Elevated troponin 10/12/2017  . Confusion 10/12/2017  . Fall 10/12/2017  . Acute urinary retention 10/12/2017  . Carotid artery stenosis 01/27/2017  . Frequent falls 01/23/2016  . 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 Atrial fibrillation with slow ventricular response ([place]) 01/21/2016  . Chronic diastolic heart failure ([place]) 01/21/2016  . Atrial flutter ([place]) 07/03/2014  . Chronic anticoagulation, started 06/29/14 for a flutter 07/03/2014  . PAD (peripheral artery disease) ([place]) 06/07/2013  . Essential hypertension 06/07/2013  . Dyslipidemia 06/07/2013  . Diabetes mellitus ([place]) 06/24/2011    Orientation RESPIRATION BLADDER Height & Weight     Self, Situation, Place  Normal Continent, Indwelling catheter Weight: 62.1 kg (137 lb) Height:  5\' 5"  (165.1 cm)  BEHAVIORAL SYMPTOMS/MOOD NEUROLOGICAL BOWEL NUTRITION STATUS      Continent Diet (Please see DC Summary)  AMBULATORY STATUS COMMUNICATION OF NEEDS Skin   Limited Assist Verbally PU Stage and Appropriate Care (Stage II on sacrum)                       Personal Care  Assistance Level of Assistance  Bathing, Feeding, Dressing Bathing Assistance: Limited assistance Feeding assistance: Independent Dressing Assistance: Limited assistance     Functional Limitations Info             SPECIAL CARE FACTORS FREQUENCY  PT (By licensed PT)     PT Frequency: 5x/week              Contractures      Additional Factors Info  Code Status, Allergies, Insulin Sliding Scale Code Status Info: Full Allergies Info: NKA   Insulin Sliding Scale Info: 3x daily with meals       Current Medications (10/15/2017):  This is the current hospital active medication list Current Facility-Administered Medications  Medication Dose Route Frequency Provider Last Rate Last Dose  . acetaminophen (TYLENOL) tablet 500 mg  500 mg Oral Q6H PRN Etta Quill, DO   500 mg at 10/14/17 1007  . apixaban (ELIQUIS) tablet 2.5 mg  2.5 mg Oral BID Etta Quill, DO   2.5 mg at 10/15/17 9629  . calcium-vitamin D (OSCAL WITH D) 500-200 MG-UNIT per tablet 1 tablet  1 tablet Oral Q breakfast Etta Quill, DO   1 tablet at 10/15/17 0810  . cefTRIAXone (ROCEPHIN) 1 g in dextrose 5 % 50 mL IVPB  1 g Intravenous Q24H Eulogio Bear U, DO   Stopped at 10/15/17 0841  . cholecalciferol (VITAMIN D) tablet 1,000 Units  1,000 Units Oral Daily Etta Quill, DO   1,000 Units at 10/15/17 0810  . ferrous sulfate tablet  325 mg  325 mg Oral Q breakfast Etta Quill, DO   325 mg at 10/15/17 7048  . gabapentin (NEURONTIN) capsule 100 mg  100 mg Oral QHS Jennette Kettle M, DO   100 mg at 10/14/17 2126  . HYDROcodone-acetaminophen (NORCO/VICODIN) 5-325 MG per tablet 1 tablet  1 tablet Oral Q6H PRN Eulogio Bear U, DO   1 tablet at 10/14/17 1747  . hydrOXYzine (ATARAX/VISTARIL) tablet 25 mg  25 mg Oral TID PRN Etta Quill, DO      . insulin aspart (novoLOG) injection 0-9 Units  0-9 Units Subcutaneous TID WC Etta Quill, DO   1 Units at 10/14/17 1747  . irbesartan (AVAPRO) tablet 150 mg   150 mg Oral Daily Deno Etienne, DO   150 mg at 10/15/17 0810  . lidocaine (LIDODERM) 5 % 1 patch  1 patch Transdermal Q24H Vann, Jessica U, DO   1 patch at 10/14/17 1051  . ondansetron (ZOFRAN) tablet 4 mg  4 mg Oral Q6H PRN Etta Quill, DO       Or  . ondansetron [facility]) injection 4 mg  4 mg Intravenous Q6H PRN Etta Quill, DO      . polyvinyl alcohol (LIQUIFILM TEARS) 1.4 % ophthalmic solution 1 drop  1 drop Both Eyes PRN Etta Quill, DO      . pravastatin (PRAVACHOL) tablet 40 mg  40 mg Oral QHS Jennette Kettle M, DO   40 mg at 10/14/17 2126  . vitamin B-12 (CYANOCOBALAMIN) tablet 1,000 mcg  1,000 mcg Oral BID Etta Quill, DO   1,000 mcg at 10/15/17 8891     Discharge Medications: Please see discharge summary for a list of discharge medications.  Relevant Imaging Results:  Relevant Lab Results:   Additional Information SSN: [place], [place]

## 2017-10-16 LAB — CK: CK TOTAL: 620 U/L — AB (ref 38–234)

## 2017-10-16 LAB — URINE CULTURE

## 2017-10-16 LAB — GLUCOSE, CAPILLARY
GLUCOSE-CAPILLARY: 135 mg/dL — AB (ref 65–99)
GLUCOSE-CAPILLARY: 133 mg/dL — AB (ref 65–99)
Glucose-Capillary: 136 mg/dL — ABNORMAL HIGH (ref 65–99)
Glucose-Capillary: 135 mg/dL — ABNORMAL HIGH (ref 65–99)

## 2017-10-16 MED ORDER — CEPHALEXIN 500 MG PO CAPS
500.0000 mg | ORAL_CAPSULE | Freq: Two times a day (BID) | ORAL | Status: DC
  Administered 2017-10-16 – 2017-10-17 (×3): 500 mg via ORAL
  Filled 2017-10-16 (×3): qty 1

## 2017-10-16 NOTE — Clinical Social Work Note (Signed)
Clinical Social Work Assessment  Patient Details  Name: Donna Owens MRN: [id number] Date of Birth: [birth year]  Date of referral:  10/16/17               Reason for consult:  Facility Placement                Permission sought to share information with:  Facility Sport and exercise psychologist, Family Supports Permission granted to share information::  Yes, Verbal Permission Granted  Name::     Donna Owens  Agency::  SNFs  Relationship::  Daughter  Contact Information:  [phone]  Housing/Transportation Living arrangements for the past 2 months:  Single Family Home Source of Information:  Patient, Adult Children Patient Interpreter Needed:  None Criminal Activity/Legal Involvement Pertinent to Current Situation/Hospitalization:  No - Comment as needed Significant Relationships:  Adult Children, Other Family Members Lives with:  Self Do you feel safe going back to the place where you live?  No Need for family participation in patient care:  Yes (Comment)  Care giving concerns:  CSW received consult for possible SNF placement at time of discharge. CSW spoke with patient and her daughter regarding PT recommendation of SNF placement at time of discharge. Patient reported that given patient's current physical needs and fall risk, she is unable to return home at this time. Patient expressed understanding of PT recommendation and is agreeable to SNF placement at time of discharge. CSW to continue to follow and assist with discharge planning needs.   Social Worker assessment / plan:  CSW spoke with patient concerning possibility of rehab at [facility] before returning home.  Employment status:  Retired Forensic scientist:  Medicare PT Recommendations:  [place] / Referral to community resources:  [place]  Patient/Family's Response to care:  Patient recognizes need for rehab before returning home and is agreeable to a SNF in [place]. Patient reported  preference for [facility] since she had toured their independent living facility.  Patient/Family's Understanding of and Emotional Response to Diagnosis, Current Treatment, and Prognosis:  Patient/family is realistic regarding therapy needs and expressed being hopeful for SNF placement. Patient expressed understanding of CSW role and discharge process as well as medical condition. No questions/concerns about plan or treatment.    Emotional Assessment Appearance:  Appears stated age Attitude/Demeanor/Rapport:  Other (Appropriate) Affect (typically observed):  Accepting, Appropriate Orientation:  Oriented to Self, Oriented to Place, Oriented to  Time, Oriented to Situation Alcohol / Substance use:  Not Applicable Psych involvement (Current and /or in the community):  No (Comment)  Discharge Needs  Concerns to be addressed:  Care Coordination Readmission within the last 30 days:  No Current discharge risk:  None Barriers to Discharge:  Continued Medical Work up   [employer], [place] 10/16/2017, 4:02 PM

## 2017-10-16 NOTE — Consult Note (Signed)
            [facility] CM Primary Care Navigator  10/16/2017  Donna Owens [birth year] [id number]   Seen patient at the bedside to identify possible discharge needs.  Patient reports that she was told she had fallen at home and daughter found her on the floor (which she can not recall) that had resulted to this admission.  Patient endorses Dr. Leanna Battles with [facility] as theprimary care provider.   Patient verbalized using  [facility] pharmacy at [place] to obtain medications without anyproblem.   Patient reports managing her own medicationsat home with use of "pill box" system filled weekly.  Patient states that daughter Donna Owens) or friend Donna Owens) provides transportation to her doctors'appointments.  Patient lives alone and independent with self care prior to admission. Her daughter and granddaughters (live nearby) will be able to provide assistance with care if needed per patient.  Anticipated discharge plan is skilled nursing facility (SNF- in process)for rehabilitation per therapy recommendation.  Patient voiced understanding to callprimary care provider's officewhen shegets backhometo schedulea post discharge follow-upwithin a week or sooner if needed. Patient letter (with PCP's contact number) was provided as areminder.  Explained to patient about [facility] CM services available for health management at home and she expressed interest for it.  Patient voiced understandingto seek referral from primary care provider to [facility] care management as deemed necessary and appropriatefor services in the future- when shereturns back home.  [facility] care management information provided for future needs that may arise.   For additional questions please contact:  Edwena Felty A. Kamarie Palma, BSN, RN-BC [facility] PRIMARY CARE Navigator Cell: [phone]

## 2017-10-16 NOTE — Progress Notes (Signed)
PROGRESS NOTE    [name]  CBJ:[id number] DOB: 1928/08/11 DOA: 10/12/2017 PCP: Donna Battles, MD   Outpatient Specialists:     Brief Narrative:  Donna Owens is a 83 y.o. female with medical history significant of A.Fib, CVA, DM2, HTN.  Patient presents to the ED after a fall at home.  LKW 630pm last night per daughter.  Patient didn't answer phone this AM, daughter went to check on her found patient on floor.  Several things in house knocked over.  Patient doesn't have any recollection of events.  Complaining of pain to low back and L leg. Found to have UTI with urinary retention.   Assessment & Plan: Fall at home due to UTI  Does not remember how the fall happened.  CT had, CT spine negative.  Has some weakness bilateral lower extremity which patient mentions is chronic.  She also mentions that she has recurrent falls in the past. No Focal deficit otherwise.  UTI present on admission  -rocephin changed to Keflex  -culture (ecoli-- done shortly after IV abx started)  Rhabdomyolysis   -follow CKS  -recheck in AM  Urinary retention - -PVR of > 500 -foley placed -We will attempt a voiding trial right now.  Monitor.  Back pain - Imaging negative, may require MRI spine if continues to have weakness. -add lidocaine patch -PRN norco  Confusion - with h/o Amnestic MCI CT head negative -B12/TSH/ammonia normal -mental status much improved with treatment of UTI  Elevated troponin - Flat No chest pain  HTN - continue home meds  DM2 - Sensitive SSI AC Hold metformin  Stage 2 pressure ulcer  A.Fib - Resume eliquis    DVT prophylaxis:  Fully anticoagulated   Code Status: Full Code   Family Communication: Called daughter  Disposition Plan:  SNF tomorrow.   Consultants:      Subjective: Feeling better, unable to urinate after removing the Foley catheter.  No nausea no vomiting.  Objective: Vitals:   10/16/17 0450 10/16/17 0800 10/16/17  1200 10/16/17 1403  BP: (!) 166/71 (!) 150/64 129/72 (!) 147/67  Pulse: 63 (!) 56 64 70  Resp: 20 20 18 18   Temp: 99 F (37.2 C) 98.3 F (36.8 C) 98.5 F (36.9 C) 98.7 F (37.1 C)  TempSrc: Oral Oral Oral Oral  SpO2: 99% 100% 98% 99%  Weight:      Height:        Intake/Output Summary (Last 24 hours) at 10/16/17 1952 Last data filed at 10/16/17 1438  Gross per 24 hour  Intake              240 ml  Output             1600 ml  Net            -1360 ml   Filed Weights   10/13/17 1200  Weight: 62.1 kg (137 lb)    Examination:  General exam: NAD Respiratory system: clear Cardiovascular system: irr Gastrointestinal system: +Bs, soft Extremities: no edema     Data Reviewed: I have personally reviewed following labs and imaging studies  CBC:  Recent Labs Lab 10/12/17 1446 10/13/17 0712 10/14/17 0606 10/15/17 0529  WBC 16.2* 12.1* 10.4 11.9*  NEUTROABS 14.0*  --   --   --   HGB 15.3* 12.0 11.9* 12.6  HCT 43.6 35.7* 35.4* 37.7  MCV 87.2 86.9 87.8 88.7  PLT 199 176 146* 160   Basic Metabolic Panel:  Recent Labs Lab  10/12/17 1446 10/13/17 0712 10/14/17 0606 10/15/17 0529  NA 135 134* 137 138  K 4.0 3.5 3.6 4.1  CL 103 109 109 106  CO2 22 19* 21* 25  GLUCOSE 153* 83 127* 134*  BUN 35* 28* 24* 15  CREATININE 1.33* 1.14* 1.11* 1.10*  CALCIUM 9.5 8.1* 8.3* 9.0   GFR: Estimated Creatinine Clearance: 32.4 mL/min (A) (by C-G formula based on SCr of 1.1 mg/dL (H)). Liver Function Tests:  Recent Labs Lab 10/12/17 1446  AST 117*  ALT 47  ALKPHOS 101  BILITOT 1.4*  PROT 8.5*  ALBUMIN 3.7   No results for input(s): LIPASE, AMYLASE in the last 168 hours.  Recent Labs Lab 10/13/17 1405  AMMONIA 9   Coagulation Profile: No results for input(s): INR, PROTIME in the last 168 hours. Cardiac Enzymes:  Recent Labs Lab 10/12/17 1446 10/12/17 2146 10/13/17 0203 10/13/17 5284 10/14/17 0606 10/16/17 0437  CKTOTAL 3,668*  --  2,212*  --  1,579* 620*    TROPONINI 0.30* 0.25* 0.30* 0.21*  --   --    BNP (last 3 results) No results for input(s): PROBNP in the last 8760 hours. HbA1C: No results for input(s): HGBA1C in the last 72 hours. CBG:  Recent Labs Lab 10/15/17 1710 10/15/17 2053 10/16/17 0751 10/16/17 1204 10/16/17 1713  GLUCAP 106* 203* 136* 135* 133*   Lipid Profile: No results for input(s): CHOL, HDL, LDLCALC, TRIG, CHOLHDL, LDLDIRECT in the last 72 hours. Thyroid Function Tests: No results for input(s): TSH, T4TOTAL, FREET4, T3FREE, THYROIDAB in the last 72 hours. Anemia Panel: No results for input(s): VITAMINB12, FOLATE, FERRITIN, TIBC, IRON, RETICCTPCT in the last 72 hours. Urine analysis:    Component Value Date/Time   COLORURINE YELLOW 10/14/2017 0544   APPEARANCEUR HAZY (A) 10/14/2017 0544   APPEARANCEUR Slightly cloudy 06/21/2015 1358   LABSPEC 1.017 10/14/2017 0544   PHURINE 5.0 10/14/2017 0544   GLUCOSEU NEGATIVE 10/14/2017 0544   HGBUR SMALL (A) 10/14/2017 0544   BILIRUBINUR NEGATIVE 10/14/2017 0544   BILIRUBINUR Negative 06/21/2015 1358   KETONESUR NEGATIVE 10/14/2017 0544   PROTEINUR 30 (A) 10/14/2017 0544   UROBILINOGEN 0.2 04/18/2015 1330   NITRITE POSITIVE (A) 10/14/2017 0544   LEUKOCYTESUR NEGATIVE 10/14/2017 0544   LEUKOCYTESUR 2+ (A) 06/21/2015 1358     ) Recent Results (from the past 240 hour(s))  Culture, Urine     Status: Abnormal   Collection Time: 10/14/17 11:05 AM  Result Value Ref Range Status   Specimen Description URINE, RANDOM  Final   Special Requests NONE  Final   Culture 80,000 COLONIES/mL ESCHERICHIA COLI (A)  Final   Report Status 10/16/2017 FINAL  Final   Organism ID, Bacteria ESCHERICHIA COLI (A)  Final      Susceptibility   Escherichia coli - MIC*    AMPICILLIN 8 SENSITIVE Sensitive     CEFAZOLIN <=4 SENSITIVE Sensitive     CEFTRIAXONE <=1 SENSITIVE Sensitive     CIPROFLOXACIN <=0.25 SENSITIVE Sensitive     GENTAMICIN <=1 SENSITIVE Sensitive     IMIPENEM <=0.25  SENSITIVE Sensitive     NITROFURANTOIN <=16 SENSITIVE Sensitive     TRIMETH/SULFA <=20 SENSITIVE Sensitive     AMPICILLIN/SULBACTAM 4 SENSITIVE Sensitive     PIP/TAZO <=4 SENSITIVE Sensitive     Extended ESBL NEGATIVE Sensitive     * 80,000 COLONIES/mL ESCHERICHIA COLI      Anti-infectives    Start     Dose/Rate Route Frequency Ordered Stop   10/16/17 1000  cephALEXin (  KEFLEX) capsule 500 mg     500 mg Oral Every 12 hours 10/16/17 0906     10/14/17 0800  cefTRIAXone (ROCEPHIN) 1 g in dextrose 5 % 50 mL IVPB  Status:  Discontinued     1 g 100 mL/hr over 30 Minutes Intravenous Every 24 hours 10/14/17 0743 10/16/17 3013       Radiology Studies: No results found.      Scheduled Meds: . apixaban  2.5 mg Oral BID  . calcium-vitamin D  1 tablet Oral Q breakfast  . cephALEXin  500 mg Oral Q12H  . cholecalciferol  1,000 Units Oral Daily  . ferrous sulfate  325 mg Oral Q breakfast  . gabapentin  100 mg Oral QHS  . insulin aspart  0-9 Units Subcutaneous TID WC  . irbesartan  150 mg Oral Daily  . lidocaine  1 patch Transdermal Q24H  . pravastatin  40 mg Oral QHS  . vitamin B-12  1,000 mcg Oral BID   Continuous Infusions:    LOS: 3 days    Time spent: 35 min  Author:  Berle Mull, MD [facility] Pager: [phone] 10/16/2017 7:57 PM     If 7PM-7AM, please contact night-coverage [email or web address] Password [email or web address] 10/16/2017, 7:52 PM

## 2017-10-17 ENCOUNTER — Inpatient Hospital Stay (HOSPITAL_COMMUNITY): Payer: Medicare Other

## 2017-10-17 DIAGNOSIS — G9340 Encephalopathy, unspecified: Secondary | ICD-10-CM | POA: Diagnosis not present

## 2017-10-17 DIAGNOSIS — M549 Dorsalgia, unspecified: Secondary | ICD-10-CM | POA: Diagnosis not present

## 2017-10-17 DIAGNOSIS — R112 Nausea with vomiting, unspecified: Secondary | ICD-10-CM | POA: Diagnosis not present

## 2017-10-17 DIAGNOSIS — R41841 Cognitive communication deficit: Secondary | ICD-10-CM | POA: Diagnosis not present

## 2017-10-17 DIAGNOSIS — G4751 Confusional arousals: Secondary | ICD-10-CM | POA: Diagnosis not present

## 2017-10-17 DIAGNOSIS — F321 Major depressive disorder, single episode, moderate: Secondary | ICD-10-CM | POA: Diagnosis not present

## 2017-10-17 DIAGNOSIS — M6282 Rhabdomyolysis: Secondary | ICD-10-CM | POA: Diagnosis not present

## 2017-10-17 DIAGNOSIS — M545 Low back pain: Secondary | ICD-10-CM | POA: Diagnosis not present

## 2017-10-17 DIAGNOSIS — M6281 Muscle weakness (generalized): Secondary | ICD-10-CM | POA: Diagnosis not present

## 2017-10-17 DIAGNOSIS — R110 Nausea: Secondary | ICD-10-CM | POA: Diagnosis not present

## 2017-10-17 DIAGNOSIS — F419 Anxiety disorder, unspecified: Secondary | ICD-10-CM | POA: Diagnosis not present

## 2017-10-17 DIAGNOSIS — R52 Pain, unspecified: Secondary | ICD-10-CM | POA: Diagnosis not present

## 2017-10-17 DIAGNOSIS — R4182 Altered mental status, unspecified: Secondary | ICD-10-CM | POA: Diagnosis not present

## 2017-10-17 DIAGNOSIS — Z111 Encounter for screening for respiratory tuberculosis: Secondary | ICD-10-CM | POA: Diagnosis not present

## 2017-10-17 DIAGNOSIS — N390 Urinary tract infection, site not specified: Secondary | ICD-10-CM | POA: Diagnosis not present

## 2017-10-17 DIAGNOSIS — E569 Vitamin deficiency, unspecified: Secondary | ICD-10-CM | POA: Diagnosis not present

## 2017-10-17 DIAGNOSIS — L299 Pruritus, unspecified: Secondary | ICD-10-CM | POA: Diagnosis not present

## 2017-10-17 DIAGNOSIS — I10 Essential (primary) hypertension: Secondary | ICD-10-CM | POA: Diagnosis not present

## 2017-10-17 DIAGNOSIS — E7849 Other hyperlipidemia: Secondary | ICD-10-CM | POA: Diagnosis not present

## 2017-10-17 DIAGNOSIS — F05 Delirium due to known physiological condition: Secondary | ICD-10-CM | POA: Diagnosis not present

## 2017-10-17 DIAGNOSIS — K219 Gastro-esophageal reflux disease without esophagitis: Secondary | ICD-10-CM | POA: Diagnosis not present

## 2017-10-17 DIAGNOSIS — R7989 Other specified abnormal findings of blood chemistry: Secondary | ICD-10-CM | POA: Diagnosis not present

## 2017-10-17 DIAGNOSIS — R338 Other retention of urine: Secondary | ICD-10-CM | POA: Diagnosis not present

## 2017-10-17 DIAGNOSIS — K5900 Constipation, unspecified: Secondary | ICD-10-CM | POA: Diagnosis not present

## 2017-10-17 DIAGNOSIS — R262 Difficulty in walking, not elsewhere classified: Secondary | ICD-10-CM | POA: Diagnosis not present

## 2017-10-17 DIAGNOSIS — G9009 Other idiopathic peripheral autonomic neuropathy: Secondary | ICD-10-CM | POA: Diagnosis not present

## 2017-10-17 DIAGNOSIS — Z9181 History of falling: Secondary | ICD-10-CM | POA: Diagnosis not present

## 2017-10-17 DIAGNOSIS — R339 Retention of urine, unspecified: Secondary | ICD-10-CM | POA: Diagnosis not present

## 2017-10-17 DIAGNOSIS — H04129 Dry eye syndrome of unspecified lacrimal gland: Secondary | ICD-10-CM | POA: Diagnosis not present

## 2017-10-17 DIAGNOSIS — Z23 Encounter for immunization: Secondary | ICD-10-CM | POA: Diagnosis not present

## 2017-10-17 DIAGNOSIS — I4891 Unspecified atrial fibrillation: Secondary | ICD-10-CM | POA: Diagnosis not present

## 2017-10-17 DIAGNOSIS — S3992XA Unspecified injury of lower back, initial encounter: Secondary | ICD-10-CM | POA: Diagnosis not present

## 2017-10-17 DIAGNOSIS — N139 Obstructive and reflux uropathy, unspecified: Secondary | ICD-10-CM | POA: Diagnosis not present

## 2017-10-17 DIAGNOSIS — E119 Type 2 diabetes mellitus without complications: Secondary | ICD-10-CM | POA: Diagnosis not present

## 2017-10-17 LAB — BASIC METABOLIC PANEL
Anion gap: 9 (ref 5–15)
BUN: 20 mg/dL (ref 6–20)
CHLORIDE: 103 mmol/L (ref 101–111)
CO2: 23 mmol/L (ref 22–32)
CREATININE: 1.16 mg/dL — AB (ref 0.44–1.00)
Calcium: 8.9 mg/dL (ref 8.9–10.3)
GFR calc Af Amer: 48 mL/min — ABNORMAL LOW (ref >60)
GFR calc non Af Amer: 41 mL/min — ABNORMAL LOW (ref >60)
Glucose, Bld: 119 mg/dL — ABNORMAL HIGH (ref 65–99)
Potassium: 4.2 mmol/L (ref 3.5–5.1)
SODIUM: 135 mmol/L (ref 135–145)

## 2017-10-17 LAB — CBC
HCT: 33.3 % — ABNORMAL LOW (ref 36.0–46.0)
HEMOGLOBIN: 10.6 g/dL — AB (ref 12.0–15.0)
MCH: 28.5 pg (ref 26.0–34.0)
MCHC: 31.8 g/dL (ref 30.0–36.0)
MCV: 89.5 fL (ref 78.0–100.0)
Platelets: 149 10*3/uL — ABNORMAL LOW (ref 150–400)
RBC: 3.72 MIL/uL — ABNORMAL LOW (ref 3.87–5.11)
RDW: 13.6 % (ref 11.5–15.5)
WBC: 7.8 10*3/uL (ref 4.0–10.5)

## 2017-10-17 LAB — GLUCOSE, CAPILLARY
Glucose-Capillary: 90 mg/dL (ref 65–99)
Glucose-Capillary: 137 mg/dL — ABNORMAL HIGH (ref 65–99)

## 2017-10-17 MED ORDER — CEPHALEXIN 500 MG PO CAPS: 500.0000 mg | ORAL_CAPSULE | Freq: Two times a day (BID) | ORAL | 0 refills | Status: AC

## 2017-10-17 MED ORDER — OLMESARTAN MEDOXOMIL 20 MG PO TABS
20.0000 mg | ORAL_TABLET | Freq: Every day | ORAL | 12 refills | Status: DC
Start: 2017-10-17 — End: 2018-02-12

## 2017-10-17 MED ORDER — LIDOCAINE 5 % EX PTCH: 1.0000 | MEDICATED_PATCH | CUTANEOUS | 0 refills | Status: DC

## 2017-10-17 MED ORDER — HYDROCODONE-ACETAMINOPHEN 5-325 MG PO TABS: 1.0000 | ORAL_TABLET | Freq: Four times a day (QID) | ORAL | 0 refills | Status: DC | PRN

## 2017-10-17 MED ORDER — MORPHINE SULFATE (PF) 2 MG/ML IV SOLN
2.0000 mg | INTRAVENOUS | Status: DC | PRN
  Administered 2017-10-17: 2 mg via INTRAVENOUS
  Filled 2017-10-17: qty 1

## 2017-10-17 NOTE — Clinical Social Work Placement (Signed)
   CLINICAL SOCIAL WORK PLACEMENT  NOTE  Date:  10/17/2017  Patient Details  Name: Donna Owens MRN: [id number] Date of Birth: [birth year]  Clinical Social Work is seeking post-discharge placement for this patient at the [place] level of care (*CSW will initial, date and re-position this form in  chart as items are completed):  Yes   Patient/family provided with [place] Work Department's list of facilities offering this level of care within the geographic area requested by the patient (or if unable, by the patient's family).  Yes   Patient/family informed of their freedom to choose among providers that offer the needed level of care, that participate in Medicare, Medicaid or managed care program needed by the patient, have an available bed and are willing to accept the patient.  Yes   Patient/family informed of [facility]'s ownership interest in [facility] and [facility], as well as of the fact that they are under no obligation to receive care at these facilities.  PASRR submitted to EDS on 10/15/17     PASRR number received on 10/15/17     Existing PASRR number confirmed on       FL2 transmitted to all facilities in geographic area requested by pt/family on 10/15/17     FL2 transmitted to all facilities within larger geographic area on       Patient informed that his/her managed care company has contracts with or will negotiate with certain facilities, including the following:        Yes   Patient/family informed of bed offers received.  Patient [facility] bed at [facility]     Physician recommends and patient chooses bed at      Patient to be transferred to [facility] on 10/17/17.  Patient to be transferred to facility by PTAR     Patient family notified on 10/17/17 of transfer.  Name of family member notified:  [name], daughter     PHYSICIAN       Additional Comment:     _______________________________________________ Benard Halsted, [place] 10/17/2017, 4:21 PM

## 2017-10-17 NOTE — Progress Notes (Addendum)
CSW advised by [facility] that patient cannot admit after 6pm. PTAR is very behind, so CSW canceled PTAR and patient's daughter will take her by car.    Patient will DC to: [facility] Anticipated DC date: 10/17/17 Family notified: Daughter Transport by: Corey Harold   Per MD patient ready for DC to [facility]. RN, patient, patient's family, and facility notified of DC. Discharge Summary sent to facility. RN given number for report [phone]. DC packet on chart. Ambulance transport requested for patient.   CSW signing off.  Cedric Fishman, [place] Social Worker [phone]

## 2017-10-17 NOTE — Progress Notes (Signed)
Physical Therapy Treatment Patient Details Name: Donna Owens MRN: [id number] DOB: 1928/11/25 Today's Date: 10/17/2017    History of Present Illness 83 y.o. female with medical history significant of A.Fib, CVA, DM2, HTN.  Patient presents to the ED after a fall at home. Imaging negative for fractures.     PT Comments    Pt progressing steadily.  Emphasis on exercise which was limited by patient and gait, also limited to the room by pt fatigue.    Follow Up Recommendations  SNF     Equipment Recommendations  None recommended by PT    Recommendations for Other Services       Precautions / Restrictions Precautions Precautions: Fall Precaution Comments: pt reports 6-7 falls in past 1 year    Mobility  Bed Mobility Overal bed mobility: Needs Assistance Bed Mobility: Supine to Sit;Sit to Supine     Supine to sit: Min assist Sit to supine: Min guard   General bed mobility comments: slow, min assist without rail and min guard if rail used.  Transfers Overall transfer level: Needs assistance Equipment used: Rolling walker (2 wheeled) Transfers: Sit to/from Stand Sit to Stand: Min guard         General transfer comment: increased time, min guard for safety  Ambulation/Gait Ambulation/Gait assistance: Min assist Ambulation Distance (Feet): 20 Feet Assistive device: Rolling walker (2 wheeled) Gait Pattern/deviations: Step-through pattern;Decreased step length - right;Decreased step length - left;Decreased stride length Gait velocity: decreased Gait velocity interpretation: Below normal speed for age/gender General Gait Details: mild unsteadiness overall, guarded.   Stairs            Wheelchair Mobility    Modified Rankin (Stroke Patients Only)       Balance     Sitting balance-Leahy Scale: Good Sitting balance - Comments: scooted easily and reached outside BOS without difficulty or LOB   Standing balance support: Bilateral upper extremity  supported Standing balance-Leahy Scale: Poor Standing balance comment: reliant on the RW                            Cognition Arousal/Alertness: Awake/alert Behavior During Therapy: WFL for tasks assessed/performed Overall Cognitive Status: Within Functional Limits for tasks assessed (NT formally, confused on timing and sequencing of events.)                                        Exercises Other Exercises Other Exercises: hip/knee flexion/extension ROM with graded resistance in extension    General Comments        Pertinent Vitals/Pain Pain Assessment: Faces Faces Pain Scale: Hurts little more Pain Location: back Pain Descriptors / Indicators: Sore Pain Intervention(s): Premedicated before session;Repositioned    Home Living                      Prior Function            PT Goals (current goals can now be found in the care plan section) Acute Rehab PT Goals Patient Stated Goal: to return home PT Goal Formulation: With patient/family Time For Goal Achievement: 10/27/17 Potential to Achieve Goals: Fair Progress towards PT goals: Progressing toward goals    Frequency    Min 3X/week      PT Plan Current plan remains appropriate    Co-evaluation  AM-PAC PT "6 Clicks" Daily Activity  Outcome Measure  Difficulty turning over in bed (including adjusting bedclothes, sheets and blankets)?: A Little Difficulty moving from lying on back to sitting on the side of the bed? : Unable Difficulty sitting down on and standing up from a chair with arms (e.g., wheelchair, bedside commode, etc,.)?: A Little Help needed moving to and from a bed to chair (including a wheelchair)?: A Little Help needed walking in hospital room?: A Little Help needed climbing 3-5 steps with a railing? : A Lot 6 Click Score: 15    End of Session   Activity Tolerance: Patient tolerated treatment well Patient left: in bed;with call  bell/phone within reach;with family/visitor present Nurse Communication: Mobility status PT Visit Diagnosis: Repeated falls (R29.6);Unsteadiness on feet (R26.81)     Time: 0981-1914 PT Time Calculation (min) (ACUTE ONLY): 30 min  Charges:  $Gait Training: 8-22 mins $Therapeutic Exercise: 8-22 mins                    G Codes:       Oct 31, 2017  Donnella Sham, PT [phone]  (pager)   Tessie Fass Nashawn Hillock 10-31-17, 4:01 PM

## 2017-10-17 NOTE — Discharge Summary (Signed)
[facility] Discharge Summary   Patient: Donna Owens IEP:[id number]   PCP: Leanna Battles, MD DOB: [birth year]   Date of admission: 10/12/2017   Date of discharge:  10/17/2017    Discharge Diagnoses:  Principal Problem:   Acute urinary retention Active Problems:   Diabetes mellitus ([place])   Essential hypertension   Atrial fibrillation with slow ventricular response (HCC)   Amnestic MCI (mild cognitive impairment with memory loss)   Rhabdomyolysis   Elevated troponin   Confusion   Fall   Pressure injury of skin   Admitted From: home Disposition:  SNF  Recommendations for Outpatient Follow-up:  1. Please follow-up with PCP in 1-2 weeks. 2. Need to establish care with urology for acute urinary retention as well as neurosurgery for follow-up on back pain  Follow-up Information    Leanna Battles, MD. Schedule an appointment as soon as possible for a visit in 1 week(s).   Specialty:  Internal Medicine Contact information: [place] [address] [phone]        Newman Pies, MD. Schedule an appointment as soon as possible for a visit in 1 month(s).   Specialty:  Neurosurgery Contact information: [address] [place] [address] [phone]        [facility], [facility]. Schedule an appointment as soon as possible for a visit in 2 week(s).   Why:  acute urinary retention.  Contact information: [address]  [place] [address] [place] [address] [phone]          Diet recommendation: Cardiac diet, carb modified  Activity: The patient is advised to gradually reintroduce usual activities.  Discharge Condition: good  Code Status: Full code  History of present illness: As per the H and P dictated on admission, "Donna Owens is a 83 y.o. female with medical history significant of A.Fib, CVA, DM2, HTN.  Patient presents to the ED after a fall at home.  LKW 630pm last night per daughter.  Patient didn't  answer phone this AM, daughter went to check on her found patient on floor.  Several things in house knocked over.  Patient doesn't have any recollection of events.  Complaining of pain to low back and L leg.   ED Course: CT head, neck, L spine neg, X rays of L knee, L hip and pelvis, CXR all neg other than some questionable constipation (patient then has large BM in ED), and bladder distention (patient does appear to have urinary retention).  Does have small hematoma to scalp.  Trop 0.30 (not clear why this was obtained).  CPK 3.6k.  Creat 1.33"  Hospital Course:  Summary of her active problems in the hospital is as following. Fall at home, recurrent fall             Does not remember how the fall happened.  CT head, CT spine negative.  Has some weakness bilateral lower extremity which patient mentions is chronic.  She also mentions that she has recurrent falls in the past. No Focal deficit otherwise. 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  L5-S1 disc bulging with foraminal stenosis bilaterally particularly on the left. Discussed with neurosurgery on call, recommended outpatient follow-up.  UTI present on admission             -rocephin changed to Keflex, complete 5-day treatment course.             -culture (ecoli-- done shortly after IV  abx started)  Rhabdomyolysis              -Encourage oral hydration.  From prolonged immobilization.  Urinary retention - -PVR of > 500 -foley placed -Patient was unable to urinate after removal of the Foley catheter and had 900 cc of retention again.  Recommend to reinsert a Foley catheter and follow-up with urology to establish care for acute urinary retention. -Her spinal stenosis is not contributing to this retention per neurosurgery.  Back pain - Imaging negative, MRI spine as above. -add lidocaine patch -PRN norco  Confusion - with h/o Amnestic MCI CT head negative -B12/TSH/ammonia  normal -mental status much improved with treatment of UTI  Elevated troponin - Flat No chest pain  HTN - continue home meds  DM2 - Continue home regimen  Stage 2 pressure ulcer  A.Fib - Resume eliquis   All other chronic medical condition were stable during the hospitalization.  Patient was seen by physical therapy, who recommended SNF, which was arranged by Education officer, museum and case Freight forwarder. On the day of the discharge the patient's vitals were stable, and no other acute medical condition were reported by patient. the patient was felt safe to be discharge at SNF with therapy.  Procedures and Results:  none   Consultations:  none  DISCHARGE MEDICATION: Current Discharge Medication List    START taking these medications   Details  cephALEXin (KEFLEX) 500 MG capsule Take 1 capsule (500 mg total) by mouth 2 (two) times daily. Qty: 4 capsule, Refills: 0    HYDROcodone-acetaminophen (NORCO/VICODIN) 5-325 MG tablet Take 1 tablet by mouth every 6 (six) hours as needed for severe pain. Qty: 30 tablet, Refills: 0    lidocaine (LIDODERM) 5 % Place 1 patch onto the skin daily. Remove & Discard patch within 12 hours or as directed by MD Qty: 30 patch, Refills: 0      CONTINUE these medications which have CHANGED   Details  olmesartan (BENICAR) 20 MG tablet Take 1 tablet (20 mg total) by mouth daily. Refills: 12      CONTINUE these medications which have NOT CHANGED   Details  acetaminophen (TYLENOL) 500 MG tablet Take 500 mg by mouth every 6 (six) hours as needed for moderate pain.    ferrous sulfate 325 (65 FE) MG tablet Take 1 tablet by mouth daily. Refills: 12    furosemide (LASIX) 20 MG tablet TAKE 1 TABLET BY MOUTH THREE TIMES A WEEK MONDAY, WEDNESDAY AND FRIDAY Qty: 36 tablet, Refills: 3    gabapentin (NEURONTIN) 300 MG capsule Take 1 capsule (300 mg total) by mouth at bedtime. Qty: 90 capsule, Refills: 11    hydrOXYzine (ATARAX/VISTARIL) 25 MG tablet Take 1  tablet (25 mg total) by mouth 3 (three) times daily as needed for itching. Qty: 30 tablet, Refills: 2    Polyethyl Glycol-Propyl Glycol (SYSTANE) 0.4-0.3 % SOLN Apply 1 drop to eye every 8 (eight) hours as needed (dry eyes).    triamcinolone cream (KENALOG) 0.1 % Apply 1 application topically 2 (two) times daily. Qty: 30 g, Refills: 0    trolamine salicylate (ASPERCREME) 10 % cream Apply 1 application topically as needed for muscle pain.    vitamin B-12 (CYANOCOBALAMIN) 1000 MCG tablet Take 1,000 mcg by mouth 2 (two) times daily.     amLODipine (NORVASC) 5 MG tablet TAKE 1 TABLET(5 MG) BY MOUTH DAILY Qty: 30 tablet, Refills: 9    apixaban (ELIQUIS) 2.5 MG TABS tablet Take 1 tablet (2.5 mg total) by  mouth 2 (two) times daily. Qty: 120 tablet, Refills: 1    calcium-vitamin D (OSCAL WITH D) 500-200 MG-UNIT per tablet Take 1 tablet by mouth daily with breakfast.     Cholecalciferol (VITAMIN D-3) 1000 UNITS CAPS Take 1 capsule by mouth daily.    Coenzyme Q10 (CO Q 10) 100 MG CAPS Take 1 capsule by mouth daily.    metFORMIN (GLUCOPHAGE) 500 MG tablet Take 1 tablet by mouth daily. Refills: 3    pravastatin (PRAVACHOL) 40 MG tablet Take 40 mg by mouth daily.    promethazine (PHENERGAN) 25 MG tablet Take 25 mg by mouth daily. nausea       No Known Allergies Discharge Instructions    Ambulatory referral to Neurosurgery    Complete by:  As directed    Ambulatory referral to Urology    Complete by:  As directed    Diet - low sodium heart healthy    Complete by:  As directed    Discharge instructions    Complete by:  As directed    It is important that you read following instructions as well as go over your medication list with RN to help you understand your care after this hospitalization.  Discharge Instructions: Please follow-up with PCP in one week  Please request your primary care physician to go over all Hospital Tests and Procedure/Radiological results at the follow up,   Please get all Hospital records sent to your PCP by signing hospital release before you go home.   Do not take more than prescribed Pain, Sleep and Anxiety Medications. You were cared for by a hospitalist during your hospital stay. If you have any questions about your discharge medications or the care you received while you were in the hospital after you are discharged, you can call the unit and ask to speak with the hospitalist on call if the hospitalist that took care of you is not available.  Once you are discharged, your primary care physician will handle any further medical issues. Please note that NO REFILLS for any discharge medications will be authorized once you are discharged, as it is imperative that you return to your primary care physician (or establish a relationship with a primary care physician if you do not have one) for your aftercare needs so that they can reassess your need for medications and monitor your lab values. You Must read complete instructions/literature along with all the possible adverse reactions/side effects for all the Medicines you take and that have been prescribed to you. Take any new Medicines after you have completely understood and accept all the possible adverse reactions/side effects. Wear Seat belts while driving. If you have smoked or chewed Tobacco in the last 2 yrs please stop smoking and/or stop any Recreational drug use.   Increase activity slowly    Complete by:  As directed      Discharge Exam: Filed Weights   10/13/17 1200  Weight: 62.1 kg (137 lb)   Vitals:   10/17/17 0447 10/17/17 1406  BP: (!) 158/60 (!) 182/78  Pulse: (!) 54 65  Resp: 18 18  Temp: 98 F (36.7 C) 98.4 F (36.9 C)  SpO2: 99% 99%   General: Appear in no distress, no Rash; Oral Mucosa moist. Cardiovascular: S1 and S2 Present, no Murmur, no JVD Respiratory: Bilateral Air entry present and Clear to Auscultation, no Crackles, no wheezes Abdomen: Bowel Sound present,  Soft and no tenderness Extremities: no Pedal edema, no calf tenderness Neurology: Grossly no focal  neuro deficit.  The results of significant diagnostics from this hospitalization (including imaging, microbiology, ancillary and laboratory) are listed below for reference.    Significant Diagnostic Studies: Dg Chest 1 View  Result Date: 10/12/2017 CLINICAL DATA:  Fall.  Confusion. EXAM: CHEST 1 VIEW COMPARISON:  04/19/2013 FINDINGS: Surgical clips about the right breast and axilla. Midline trachea. Moderate cardiomegaly. Atherosclerosis in the transverse aorta. No pleural effusion or pneumothorax. No congestive failure. Clear lungs. IMPRESSION: Cardiomegaly, without congestive failure. Aortic Atherosclerosis (ICD10-I70.0). Electronically Signed   By: Abigail Miyamoto M.D.   On: 10/12/2017 14:32   Dg Thoracic Spine 2 View  Result Date: 10/12/2017 CLINICAL DATA:  Status post fall, with mid back pain. Initial encounter. EXAM: THORACIC SPINE 2 VIEWS COMPARISON:  Chest radiograph performed earlier today at 1:43 p.m., and CTA of the chest performed 04/21/2011 FINDINGS: There is no evidence of fracture or subluxation. Vertebral bodies demonstrate normal height and alignment. Mild degenerative change is noted along the upper lumbar spine. The visualized portions of both lungs are clear. The mediastinum is unremarkable in appearance. Scattered calcification is seen along the proximal abdominal aorta. IMPRESSION: 1. No evidence of fracture or subluxation along the thoracic spine. 2. Scattered aortic atherosclerosis. Electronically Signed   By: Garald Balding M.D.   On: 10/12/2017 21:27   Dg Lumbar Spine Complete  Result Date: 10/12/2017 CLINICAL DATA:  Fall.  Confusion. EXAM: LUMBAR SPINE - COMPLETE 4+ VIEW COMPARISON:  08/03/2012 FINDINGS: Extensive overlying bowel gas, degrading the frontal radiograph. Sacroiliac joints are grossly symmetric. Large amount of stool in the rectum. Abdominal aortic  atherosclerosis. The lateral views are suboptimal secondary to technique. The elbow overlies the upper lumbar spine. No gross vertebral body height loss. Straightening of expected lordosis. Spondylosis is age expected. IMPRESSION: Moderately degraded exam secondary to positioning. No gross acute osseous abnormality identified. Nonspecific straightening of expected lordosis. Aortic Atherosclerosis (ICD10-I70.0). Possible constipation. Electronically Signed   By: Abigail Miyamoto M.D.   On: 10/12/2017 14:34   Ct Head Wo Contrast  Result Date: 10/12/2017 CLINICAL DATA:  Altered level of consciousness. Patient presents to the ED from home with complaints of fall. Patient reports trip over plant. Patient reports has been on the floor all night until daughter found her this morning. EXAM: CT HEAD WITHOUT CONTRAST TECHNIQUE: Contiguous axial images were obtained from the base of the skull through the vertex without intravenous contrast. COMPARISON:  06/19/2016 FINDINGS: Brain: No evidence of acute infarction, hemorrhage, extra-axial collection, ventriculomegaly, or mass effect. Generalized cerebral atrophy. Periventricular white matter low attenuation likely secondary to microangiopathy. Vascular: Cerebrovascular atherosclerotic calcifications are noted. Skull: Negative for fracture or focal lesion. Sinuses/Orbits: Visualized portions of the orbits are unremarkable. Mastoid sinuses are clear. Small air-fluid level in the left maxillary sinus. Other: None. IMPRESSION: 1. No acute intracranial pathology. 2. Chronic microvascular disease and cerebral atrophy. Electronically Signed   By: Kathreen Devoid   On: 10/12/2017 16:11   Ct Cervical Spine Wo Contrast  Result Date: 10/12/2017 CLINICAL DATA:  Cervical spine trauma, high clinical risk EXAM: CT CERVICAL SPINE WITHOUT CONTRAST TECHNIQUE: Multidetector CT imaging of the cervical spine was performed without intravenous contrast. Multiplanar CT image reconstructions were  also generated. COMPARISON:  06/19/2016 FINDINGS: Alignment: Mild anterolisthesis C4-5 and C5-6 Skull base and vertebrae: Negative for fracture Soft tissues and spinal canal: Extensive atherosclerotic calcification the carotid artery. Bilateral thyroid nodules. Negative for mass or adenopathy Disc levels: Extensive facet degeneration throughout the cervical spine bilaterally, right greater than left. Disc degeneration  and uncinate spurring C5-6 and C6-7 with mild spinal stenosis. Upper chest: Negative Other: None IMPRESSION: Negative for cervical spine fracture. Moderate degenerative changes in the cervical spine. Electronically Signed   By: Franchot Gallo M.D.   On: 10/12/2017 18:55   Ct Lumbar Spine Wo Contrast  Result Date: 10/12/2017 CLINICAL DATA:  Initial evaluation for acute back pain, fall. EXAM: CT LUMBAR SPINE WITHOUT CONTRAST TECHNIQUE: Multidetector CT imaging of the lumbar spine was performed without intravenous contrast administration. Multiplanar CT image reconstructions were also generated. COMPARISON:  Comparison made with prior radiograph from earlier the same day. FINDINGS: Segmentation: Normal segmentation. Lowest well-formed disc labeled the L5-S1 level. Alignment: Straightening of the normal lumbar lordosis with trace dextroscoliosis. No listhesis. Vertebrae: Vertebral body heights are maintained. No evidence for acute or chronic fracture. No discrete lytic or blastic osseous lesions. Partially visualized sacrum and pelvis are intact. SI joints approximated and symmetric. L3, L4, and L5 vertebral bodies are partially ankylosed. Paraspinal and other soft tissues: Paraspinous soft tissues demonstrate no acute abnormality. Visualized lungs are clear. Cardiomegaly partially visualized. Extensive aortic atherosclerosis. Visualized urinary bladder is markedly distended. Disc levels: L1-2: Mild circumferential disc bulge with intervertebral disc space narrowing. Moderate facet and ligamentum  flavum hypertrophy. No significant stenosis. L2-3: Mild diffuse disc bulge with intervertebral disc space narrowing and disc desiccation. Moderate facet and ligamentum flavum hypertrophy. No significant stenosis. L3-4: Ankylosis of the L3 and L4 vertebral bodies with loss of L3-4 disc space. Moderate facet hypertrophy. No canal stenosis. Mild bilateral L3 foraminal narrowing. L4-5: Ankylosis of the L4 and L5 vertebral bodies with loss of L4-5 disc space. Mild facet hypertrophy. No significant stenosis. L5-S1: Diffuse disc bulge with disc desiccation and intervertebral disc space narrowing. Left far lateral endplate osteophytic spurring. Mild to moderate facet arthrosis. No significant canal stenosis. Moderate to advanced bilateral L5 foraminal narrowing, left worse than right. IMPRESSION: 1. No acute traumatic injury within the lumbar spine. 2. Straightening of the normal lumbar lordosis with multilevel degenerative spondylolysis and facet arthrosis as above. No significant canal stenosis. Moderate to advanced bilateral L5 foraminal narrowing. 3. Cardiomegaly with advanced aortic atherosclerosis. 4. Marked dilatation of the partially visualized urinary bladder. Correlation with urinary output suggested. Electronically Signed   By: Jeannine Boga M.D.   On: 10/12/2017 16:17   Mr Lumbar Spine Wo Contrast  Result Date: 10/17/2017 CLINICAL DATA:  Back pain greater than 6 weeks duration. Fell on 10/11/2017. Pain in the back and left leg. EXAM: MRI LUMBAR SPINE WITHOUT CONTRAST TECHNIQUE: Multiplanar, multisequence MR imaging of the lumbar spine was performed. No intravenous contrast was administered. COMPARISON:  CT 10/12/2017. FINDINGS: The patient could only tolerate sagittal imaging because of severe pain. Segmentation:  5 lumbar type vertebral bodies. Alignment:  Straightening of the normal lumbar lordosis. Vertebrae: Distant fusion or ankylosis from L3 through L5. Discogenic endplate changes anterior  inferior L5 with edema that could be associated with back pain. Conus medullaris: Extends to the L1-2 level and appears normal. Paraspinal and other soft tissues: No significant finding seen on the sagittal imaging. Disc levels: T11-12 and T12-L1: Noncompressive disc bulges. Facet arthritis bilaterally with mild edema. L1-2: Shallow disc protrusion centrally predominant. This indents the thecal sac slightly but does not cause stenosis or neural compression. Facet arthritis bilaterally with mild edema. L2-3: Mild bulging of the disc. No compressive stenosis. Facet joint edema bilaterally. L3 through L5: Chronic fusion or ankylosis. No stenosis of the canal or foramina. L5-S1: Disc degeneration with bulging of the  disc, focally prominent in both foraminal regions left more than right. Bilateral facet arthropathy. Foraminal stenosis could affect either L5 nerve, more likely on the left. Discogenic edema within the anterior inferior L5 vertebral body could be associated with back pain. IMPRESSION: Fusion or ankylosis from L3 through L5. Wide patency of the canal and foramina within that segment. L5-S1 disc degeneration with bulging and biforaminal protrusions, more pronounced on the left. Foraminal stenosis could compress either or both L5 nerves, particularly the left. Bilateral facet arthropathy could be associated with pain. Discogenic edema of the anterior inferior L5 vertebral body could be associated with pain. Lower thoracic and upper lumbar noncompressive disc bulges and protrusions. Facet arthropathy throughout that region were with edema which could be associated with pain. If in fact the patient has not has surgery, particularly considering the facet arthropathy, this patient could have a seronegative spondyloarthropathy. Electronically Signed   By: Nelson Chimes M.D.   On: 10/17/2017 13:19   Dg Knee Complete 4 Views Left  Result Date: 10/12/2017 CLINICAL DATA:  Fall today.  Left knee pain.  Initial  encounter. EXAM: LEFT KNEE - COMPLETE 4+ VIEW COMPARISON:  None. FINDINGS: No evidence of fracture, dislocation, or joint effusion. No evidence of arthropathy or other focal bone abnormality. Mild patellar enthesopathy noted. Peripheral vascular calcification also seen. IMPRESSION: No acute findings. Electronically Signed   By: Earle Gell M.D.   On: 10/12/2017 14:38   Dg Hip Unilat W Or Wo Pelvis 2-3 Views Left  Result Date: 10/12/2017 CLINICAL DATA:  Fall.  Confusion. EXAM: DG HIP (WITH OR WITHOUT PELVIS) 2-3V LEFT COMPARISON:  None. FINDINGS: AP view pelvis and AP/frog leg views of the left hip. Femoral heads are located. Vascular calcifications. Femoral heads are located. No acute fracture. Large amount of stool in the rectum. Joint spaces are maintained for age. Degenerate disc disease involves the lumbosacral junction. IMPRESSION: No acute osseous abnormality. Electronically Signed   By: Abigail Miyamoto M.D.   On: 10/12/2017 14:38    Microbiology: Recent Results (from the past 240 hour(s))  Culture, Urine     Status: Abnormal   Collection Time: 10/14/17 11:05 AM  Result Value Ref Range Status   Specimen Description URINE, RANDOM  Final   Special Requests NONE  Final   Culture 80,000 COLONIES/mL ESCHERICHIA COLI (A)  Final   Report Status 10/16/2017 FINAL  Final   Organism ID, Bacteria ESCHERICHIA COLI (A)  Final      Susceptibility   Escherichia coli - MIC*    AMPICILLIN 8 SENSITIVE Sensitive     CEFAZOLIN <=4 SENSITIVE Sensitive     CEFTRIAXONE <=1 SENSITIVE Sensitive     CIPROFLOXACIN <=0.25 SENSITIVE Sensitive     GENTAMICIN <=1 SENSITIVE Sensitive     IMIPENEM <=0.25 SENSITIVE Sensitive     NITROFURANTOIN <=16 SENSITIVE Sensitive     TRIMETH/SULFA <=20 SENSITIVE Sensitive     AMPICILLIN/SULBACTAM 4 SENSITIVE Sensitive     PIP/TAZO <=4 SENSITIVE Sensitive     Extended ESBL NEGATIVE Sensitive     * 80,000 COLONIES/mL ESCHERICHIA COLI     Labs: CBC:  Recent Labs Lab  10/12/17 1446 10/13/17 0712 10/14/17 0606 10/15/17 0529 10/17/17 0631  WBC 16.2* 12.1* 10.4 11.9* 7.8  NEUTROABS 14.0*  --   --   --   --   HGB 15.3* 12.0 11.9* 12.6 10.6*  HCT 43.6 35.7* 35.4* 37.7 33.3*  MCV 87.2 86.9 87.8 88.7 89.5  PLT 199 176 146* 183 149*  Basic Metabolic Panel:  Recent Labs Lab 10/12/17 1446 10/13/17 0712 10/14/17 0606 10/15/17 0529 10/17/17 0631  NA 135 134* 137 138 135  K 4.0 3.5 3.6 4.1 4.2  CL 103 109 109 106 103  CO2 22 19* 21* 25 23  GLUCOSE 153* 83 127* 134* 119*  BUN 35* 28* 24* 15 20  CREATININE 1.33* 1.14* 1.11* 1.10* 1.16*  CALCIUM 9.5 8.1* 8.3* 9.0 8.9   Liver Function Tests:  Recent Labs Lab 10/12/17 1446  AST 117*  ALT 47  ALKPHOS 101  BILITOT 1.4*  PROT 8.5*  ALBUMIN 3.7   No results for input(s): LIPASE, AMYLASE in the last 168 hours.  Recent Labs Lab 10/13/17 1405  AMMONIA 9   Cardiac Enzymes:  Recent Labs Lab 10/12/17 1446 10/12/17 2146 10/13/17 0203 10/13/17 0712 10/14/17 0606 10/16/17 0437  CKTOTAL 3,668*  --  2,212*  --  1,579* 620*  TROPONINI 0.30* 0.25* 0.30* 0.21*  --   --    BNP (last 3 results) No results for input(s): BNP in the last 8760 hours. CBG:  Recent Labs Lab 10/16/17 1204 10/16/17 1713 10/16/17 2112 10/17/17 0832 10/17/17 1156  GLUCAP 135* 133* 135* 90 137*   Time spent: 35 minutes  Signed:  Blayklee Mable  [facility]  10/17/2017  , 3:23 PM

## 2017-10-17 NOTE — Discharge Summary (Signed)
Report called to [facility], [place] discharged via family. Pt is alert and oriented x4. Patient medicated prior to d/c and had small BM prior. D/c paperwork and scripts sent to facility

## 2017-10-17 NOTE — Care Management Important Message (Signed)
Important Message  Patient Details  Name: Donna Owens MRN: [id number] Date of Birth: Apr 23, 1928   Medicare Important Message Given:  Yes    [name] 10/17/2017, 10:28 AM

## 2017-10-20 DIAGNOSIS — G9009 Other idiopathic peripheral autonomic neuropathy: Secondary | ICD-10-CM | POA: Diagnosis not present

## 2017-10-20 DIAGNOSIS — R52 Pain, unspecified: Secondary | ICD-10-CM | POA: Diagnosis not present

## 2017-10-20 DIAGNOSIS — M6282 Rhabdomyolysis: Secondary | ICD-10-CM | POA: Diagnosis not present

## 2017-10-20 DIAGNOSIS — M6281 Muscle weakness (generalized): Secondary | ICD-10-CM | POA: Diagnosis not present

## 2017-10-20 DIAGNOSIS — I10 Essential (primary) hypertension: Secondary | ICD-10-CM | POA: Diagnosis not present

## 2017-10-20 DIAGNOSIS — R339 Retention of urine, unspecified: Secondary | ICD-10-CM | POA: Diagnosis not present

## 2017-10-20 DIAGNOSIS — I4891 Unspecified atrial fibrillation: Secondary | ICD-10-CM | POA: Diagnosis not present

## 2017-10-20 DIAGNOSIS — G4751 Confusional arousals: Secondary | ICD-10-CM | POA: Diagnosis not present

## 2017-10-20 DIAGNOSIS — N390 Urinary tract infection, site not specified: Secondary | ICD-10-CM | POA: Diagnosis not present

## 2017-10-20 DIAGNOSIS — E119 Type 2 diabetes mellitus without complications: Secondary | ICD-10-CM | POA: Diagnosis not present

## 2017-10-20 DIAGNOSIS — M545 Low back pain: Secondary | ICD-10-CM | POA: Diagnosis not present

## 2017-10-20 DIAGNOSIS — L299 Pruritus, unspecified: Secondary | ICD-10-CM | POA: Diagnosis not present

## 2017-10-21 DIAGNOSIS — E119 Type 2 diabetes mellitus without complications: Secondary | ICD-10-CM | POA: Diagnosis not present

## 2017-10-21 DIAGNOSIS — G9009 Other idiopathic peripheral autonomic neuropathy: Secondary | ICD-10-CM | POA: Diagnosis not present

## 2017-10-21 DIAGNOSIS — H04129 Dry eye syndrome of unspecified lacrimal gland: Secondary | ICD-10-CM | POA: Diagnosis not present

## 2017-10-21 DIAGNOSIS — N390 Urinary tract infection, site not specified: Secondary | ICD-10-CM | POA: Diagnosis not present

## 2017-10-21 DIAGNOSIS — I4891 Unspecified atrial fibrillation: Secondary | ICD-10-CM | POA: Diagnosis not present

## 2017-10-21 DIAGNOSIS — I10 Essential (primary) hypertension: Secondary | ICD-10-CM | POA: Diagnosis not present

## 2017-10-21 DIAGNOSIS — E569 Vitamin deficiency, unspecified: Secondary | ICD-10-CM | POA: Diagnosis not present

## 2017-10-21 DIAGNOSIS — M545 Low back pain: Secondary | ICD-10-CM | POA: Diagnosis not present

## 2017-10-21 DIAGNOSIS — R339 Retention of urine, unspecified: Secondary | ICD-10-CM | POA: Diagnosis not present

## 2017-10-21 DIAGNOSIS — M6281 Muscle weakness (generalized): Secondary | ICD-10-CM | POA: Diagnosis not present

## 2017-10-21 DIAGNOSIS — M6282 Rhabdomyolysis: Secondary | ICD-10-CM | POA: Diagnosis not present

## 2017-10-21 DIAGNOSIS — L299 Pruritus, unspecified: Secondary | ICD-10-CM | POA: Diagnosis not present

## 2017-10-24 DIAGNOSIS — G9009 Other idiopathic peripheral autonomic neuropathy: Secondary | ICD-10-CM | POA: Diagnosis not present

## 2017-10-24 DIAGNOSIS — E119 Type 2 diabetes mellitus without complications: Secondary | ICD-10-CM | POA: Diagnosis not present

## 2017-10-24 DIAGNOSIS — L299 Pruritus, unspecified: Secondary | ICD-10-CM | POA: Diagnosis not present

## 2017-10-24 DIAGNOSIS — M6282 Rhabdomyolysis: Secondary | ICD-10-CM | POA: Diagnosis not present

## 2017-10-24 DIAGNOSIS — M6281 Muscle weakness (generalized): Secondary | ICD-10-CM | POA: Diagnosis not present

## 2017-10-24 DIAGNOSIS — I4891 Unspecified atrial fibrillation: Secondary | ICD-10-CM | POA: Diagnosis not present

## 2017-10-24 DIAGNOSIS — M545 Low back pain: Secondary | ICD-10-CM | POA: Diagnosis not present

## 2017-10-24 DIAGNOSIS — G9340 Encephalopathy, unspecified: Secondary | ICD-10-CM | POA: Diagnosis not present

## 2017-10-24 DIAGNOSIS — N390 Urinary tract infection, site not specified: Secondary | ICD-10-CM | POA: Diagnosis not present

## 2017-10-24 DIAGNOSIS — H04129 Dry eye syndrome of unspecified lacrimal gland: Secondary | ICD-10-CM | POA: Diagnosis not present

## 2017-10-24 DIAGNOSIS — R339 Retention of urine, unspecified: Secondary | ICD-10-CM | POA: Diagnosis not present

## 2017-10-24 DIAGNOSIS — I10 Essential (primary) hypertension: Secondary | ICD-10-CM | POA: Diagnosis not present

## 2017-10-29 DIAGNOSIS — R339 Retention of urine, unspecified: Secondary | ICD-10-CM | POA: Diagnosis not present

## 2017-11-03 DIAGNOSIS — G9009 Other idiopathic peripheral autonomic neuropathy: Secondary | ICD-10-CM | POA: Diagnosis not present

## 2017-11-03 DIAGNOSIS — M6281 Muscle weakness (generalized): Secondary | ICD-10-CM | POA: Diagnosis not present

## 2017-11-03 DIAGNOSIS — N390 Urinary tract infection, site not specified: Secondary | ICD-10-CM | POA: Diagnosis not present

## 2017-11-03 DIAGNOSIS — M6282 Rhabdomyolysis: Secondary | ICD-10-CM | POA: Diagnosis not present

## 2017-11-03 DIAGNOSIS — I4891 Unspecified atrial fibrillation: Secondary | ICD-10-CM | POA: Diagnosis not present

## 2017-11-03 DIAGNOSIS — R339 Retention of urine, unspecified: Secondary | ICD-10-CM | POA: Diagnosis not present

## 2017-11-03 DIAGNOSIS — M545 Low back pain: Secondary | ICD-10-CM | POA: Diagnosis not present

## 2017-11-03 DIAGNOSIS — G9340 Encephalopathy, unspecified: Secondary | ICD-10-CM | POA: Diagnosis not present

## 2017-11-03 DIAGNOSIS — I10 Essential (primary) hypertension: Secondary | ICD-10-CM | POA: Diagnosis not present

## 2017-11-03 DIAGNOSIS — K5900 Constipation, unspecified: Secondary | ICD-10-CM | POA: Diagnosis not present

## 2017-11-03 DIAGNOSIS — E119 Type 2 diabetes mellitus without complications: Secondary | ICD-10-CM | POA: Diagnosis not present

## 2017-11-03 DIAGNOSIS — R112 Nausea with vomiting, unspecified: Secondary | ICD-10-CM | POA: Diagnosis not present

## 2017-11-05 DIAGNOSIS — R339 Retention of urine, unspecified: Secondary | ICD-10-CM | POA: Diagnosis not present

## 2017-11-06 ENCOUNTER — Ambulatory Visit: Payer: Medicare Other | Admitting: Cardiovascular Disease

## 2017-11-07 DIAGNOSIS — I10 Essential (primary) hypertension: Secondary | ICD-10-CM | POA: Diagnosis not present

## 2017-11-07 DIAGNOSIS — M545 Low back pain: Secondary | ICD-10-CM | POA: Diagnosis not present

## 2017-11-07 DIAGNOSIS — R339 Retention of urine, unspecified: Secondary | ICD-10-CM | POA: Diagnosis not present

## 2017-11-07 DIAGNOSIS — M6281 Muscle weakness (generalized): Secondary | ICD-10-CM | POA: Diagnosis not present

## 2017-11-07 DIAGNOSIS — R112 Nausea with vomiting, unspecified: Secondary | ICD-10-CM | POA: Diagnosis not present

## 2017-11-07 DIAGNOSIS — K5900 Constipation, unspecified: Secondary | ICD-10-CM | POA: Diagnosis not present

## 2017-11-07 DIAGNOSIS — K219 Gastro-esophageal reflux disease without esophagitis: Secondary | ICD-10-CM | POA: Diagnosis not present

## 2017-11-07 DIAGNOSIS — I4891 Unspecified atrial fibrillation: Secondary | ICD-10-CM | POA: Diagnosis not present

## 2017-11-07 DIAGNOSIS — G9340 Encephalopathy, unspecified: Secondary | ICD-10-CM | POA: Diagnosis not present

## 2017-11-07 DIAGNOSIS — G9009 Other idiopathic peripheral autonomic neuropathy: Secondary | ICD-10-CM | POA: Diagnosis not present

## 2017-11-07 DIAGNOSIS — E119 Type 2 diabetes mellitus without complications: Secondary | ICD-10-CM | POA: Diagnosis not present

## 2017-11-07 DIAGNOSIS — N390 Urinary tract infection, site not specified: Secondary | ICD-10-CM | POA: Diagnosis not present

## 2017-11-12 DIAGNOSIS — Z7901 Long term (current) use of anticoagulants: Secondary | ICD-10-CM | POA: Diagnosis not present

## 2017-11-12 DIAGNOSIS — N390 Urinary tract infection, site not specified: Secondary | ICD-10-CM | POA: Diagnosis not present

## 2017-11-12 DIAGNOSIS — I10 Essential (primary) hypertension: Secondary | ICD-10-CM | POA: Diagnosis not present

## 2017-11-12 DIAGNOSIS — G4733 Obstructive sleep apnea (adult) (pediatric): Secondary | ICD-10-CM | POA: Diagnosis not present

## 2017-11-12 DIAGNOSIS — E1140 Type 2 diabetes mellitus with diabetic neuropathy, unspecified: Secondary | ICD-10-CM | POA: Diagnosis not present

## 2017-11-12 DIAGNOSIS — M6282 Rhabdomyolysis: Secondary | ICD-10-CM | POA: Diagnosis not present

## 2017-11-12 DIAGNOSIS — M810 Age-related osteoporosis without current pathological fracture: Secondary | ICD-10-CM | POA: Diagnosis not present

## 2017-11-12 DIAGNOSIS — M5127 Other intervertebral disc displacement, lumbosacral region: Secondary | ICD-10-CM | POA: Diagnosis not present

## 2017-11-12 DIAGNOSIS — M4807 Spinal stenosis, lumbosacral region: Secondary | ICD-10-CM | POA: Diagnosis not present

## 2017-11-12 DIAGNOSIS — I482 Chronic atrial fibrillation: Secondary | ICD-10-CM | POA: Diagnosis not present

## 2017-11-20 DIAGNOSIS — I482 Chronic atrial fibrillation: Secondary | ICD-10-CM | POA: Diagnosis not present

## 2017-11-20 DIAGNOSIS — E1140 Type 2 diabetes mellitus with diabetic neuropathy, unspecified: Secondary | ICD-10-CM | POA: Diagnosis not present

## 2017-11-20 DIAGNOSIS — M6282 Rhabdomyolysis: Secondary | ICD-10-CM | POA: Diagnosis not present

## 2017-11-20 DIAGNOSIS — M4807 Spinal stenosis, lumbosacral region: Secondary | ICD-10-CM | POA: Diagnosis not present

## 2017-11-20 DIAGNOSIS — N390 Urinary tract infection, site not specified: Secondary | ICD-10-CM | POA: Diagnosis not present

## 2017-11-20 DIAGNOSIS — M5127 Other intervertebral disc displacement, lumbosacral region: Secondary | ICD-10-CM | POA: Diagnosis not present

## 2017-11-21 DIAGNOSIS — M5127 Other intervertebral disc displacement, lumbosacral region: Secondary | ICD-10-CM | POA: Diagnosis not present

## 2017-11-21 DIAGNOSIS — I482 Chronic atrial fibrillation: Secondary | ICD-10-CM | POA: Diagnosis not present

## 2017-11-21 DIAGNOSIS — M4807 Spinal stenosis, lumbosacral region: Secondary | ICD-10-CM | POA: Diagnosis not present

## 2017-11-21 DIAGNOSIS — N390 Urinary tract infection, site not specified: Secondary | ICD-10-CM | POA: Diagnosis not present

## 2017-11-21 DIAGNOSIS — M6282 Rhabdomyolysis: Secondary | ICD-10-CM | POA: Diagnosis not present

## 2017-11-21 DIAGNOSIS — E1140 Type 2 diabetes mellitus with diabetic neuropathy, unspecified: Secondary | ICD-10-CM | POA: Diagnosis not present

## 2017-11-25 DIAGNOSIS — E1140 Type 2 diabetes mellitus with diabetic neuropathy, unspecified: Secondary | ICD-10-CM | POA: Diagnosis not present

## 2017-11-25 DIAGNOSIS — N390 Urinary tract infection, site not specified: Secondary | ICD-10-CM | POA: Diagnosis not present

## 2017-11-25 DIAGNOSIS — M6282 Rhabdomyolysis: Secondary | ICD-10-CM | POA: Diagnosis not present

## 2017-11-25 DIAGNOSIS — I482 Chronic atrial fibrillation: Secondary | ICD-10-CM | POA: Diagnosis not present

## 2017-11-25 DIAGNOSIS — M4807 Spinal stenosis, lumbosacral region: Secondary | ICD-10-CM | POA: Diagnosis not present

## 2017-11-25 DIAGNOSIS — M5127 Other intervertebral disc displacement, lumbosacral region: Secondary | ICD-10-CM | POA: Diagnosis not present

## 2017-11-27 DIAGNOSIS — M5127 Other intervertebral disc displacement, lumbosacral region: Secondary | ICD-10-CM | POA: Diagnosis not present

## 2017-11-27 DIAGNOSIS — E1140 Type 2 diabetes mellitus with diabetic neuropathy, unspecified: Secondary | ICD-10-CM | POA: Diagnosis not present

## 2017-11-27 DIAGNOSIS — I482 Chronic atrial fibrillation: Secondary | ICD-10-CM | POA: Diagnosis not present

## 2017-11-27 DIAGNOSIS — N390 Urinary tract infection, site not specified: Secondary | ICD-10-CM | POA: Diagnosis not present

## 2017-11-27 DIAGNOSIS — M6282 Rhabdomyolysis: Secondary | ICD-10-CM | POA: Diagnosis not present

## 2017-11-27 DIAGNOSIS — M4807 Spinal stenosis, lumbosacral region: Secondary | ICD-10-CM | POA: Diagnosis not present

## 2017-12-03 DIAGNOSIS — M4807 Spinal stenosis, lumbosacral region: Secondary | ICD-10-CM | POA: Diagnosis not present

## 2017-12-03 DIAGNOSIS — E1140 Type 2 diabetes mellitus with diabetic neuropathy, unspecified: Secondary | ICD-10-CM | POA: Diagnosis not present

## 2017-12-03 DIAGNOSIS — I482 Chronic atrial fibrillation: Secondary | ICD-10-CM | POA: Diagnosis not present

## 2017-12-03 DIAGNOSIS — N390 Urinary tract infection, site not specified: Secondary | ICD-10-CM | POA: Diagnosis not present

## 2017-12-03 DIAGNOSIS — M6282 Rhabdomyolysis: Secondary | ICD-10-CM | POA: Diagnosis not present

## 2017-12-03 DIAGNOSIS — M5127 Other intervertebral disc displacement, lumbosacral region: Secondary | ICD-10-CM | POA: Diagnosis not present

## 2017-12-05 DIAGNOSIS — E1140 Type 2 diabetes mellitus with diabetic neuropathy, unspecified: Secondary | ICD-10-CM | POA: Diagnosis not present

## 2017-12-05 DIAGNOSIS — M5127 Other intervertebral disc displacement, lumbosacral region: Secondary | ICD-10-CM | POA: Diagnosis not present

## 2017-12-05 DIAGNOSIS — I482 Chronic atrial fibrillation: Secondary | ICD-10-CM | POA: Diagnosis not present

## 2017-12-05 DIAGNOSIS — M6282 Rhabdomyolysis: Secondary | ICD-10-CM | POA: Diagnosis not present

## 2017-12-05 DIAGNOSIS — M4807 Spinal stenosis, lumbosacral region: Secondary | ICD-10-CM | POA: Diagnosis not present

## 2017-12-05 DIAGNOSIS — N390 Urinary tract infection, site not specified: Secondary | ICD-10-CM | POA: Diagnosis not present

## 2017-12-09 DIAGNOSIS — N390 Urinary tract infection, site not specified: Secondary | ICD-10-CM | POA: Diagnosis not present

## 2017-12-09 DIAGNOSIS — I482 Chronic atrial fibrillation: Secondary | ICD-10-CM | POA: Diagnosis not present

## 2017-12-09 DIAGNOSIS — E1140 Type 2 diabetes mellitus with diabetic neuropathy, unspecified: Secondary | ICD-10-CM | POA: Diagnosis not present

## 2017-12-09 DIAGNOSIS — M5127 Other intervertebral disc displacement, lumbosacral region: Secondary | ICD-10-CM | POA: Diagnosis not present

## 2017-12-09 DIAGNOSIS — M6282 Rhabdomyolysis: Secondary | ICD-10-CM | POA: Diagnosis not present

## 2017-12-09 DIAGNOSIS — M4807 Spinal stenosis, lumbosacral region: Secondary | ICD-10-CM | POA: Diagnosis not present

## 2017-12-11 DIAGNOSIS — I482 Chronic atrial fibrillation: Secondary | ICD-10-CM | POA: Diagnosis not present

## 2017-12-11 DIAGNOSIS — E1140 Type 2 diabetes mellitus with diabetic neuropathy, unspecified: Secondary | ICD-10-CM | POA: Diagnosis not present

## 2017-12-11 DIAGNOSIS — M6282 Rhabdomyolysis: Secondary | ICD-10-CM | POA: Diagnosis not present

## 2017-12-11 DIAGNOSIS — M4807 Spinal stenosis, lumbosacral region: Secondary | ICD-10-CM | POA: Diagnosis not present

## 2017-12-11 DIAGNOSIS — M5127 Other intervertebral disc displacement, lumbosacral region: Secondary | ICD-10-CM | POA: Diagnosis not present

## 2017-12-11 DIAGNOSIS — N390 Urinary tract infection, site not specified: Secondary | ICD-10-CM | POA: Diagnosis not present

## 2017-12-12 DIAGNOSIS — I7389 Other specified peripheral vascular diseases: Secondary | ICD-10-CM | POA: Diagnosis not present

## 2017-12-12 DIAGNOSIS — M545 Low back pain: Secondary | ICD-10-CM | POA: Diagnosis not present

## 2017-12-12 DIAGNOSIS — G4733 Obstructive sleep apnea (adult) (pediatric): Secondary | ICD-10-CM | POA: Diagnosis not present

## 2017-12-12 DIAGNOSIS — R2689 Other abnormalities of gait and mobility: Secondary | ICD-10-CM | POA: Diagnosis not present

## 2017-12-12 DIAGNOSIS — E1151 Type 2 diabetes mellitus with diabetic peripheral angiopathy without gangrene: Secondary | ICD-10-CM | POA: Diagnosis not present

## 2017-12-12 DIAGNOSIS — M25561 Pain in right knee: Secondary | ICD-10-CM | POA: Diagnosis not present

## 2017-12-12 DIAGNOSIS — E7849 Other hyperlipidemia: Secondary | ICD-10-CM | POA: Diagnosis not present

## 2017-12-12 DIAGNOSIS — I10 Essential (primary) hypertension: Secondary | ICD-10-CM | POA: Diagnosis not present

## 2017-12-12 DIAGNOSIS — E46 Unspecified protein-calorie malnutrition: Secondary | ICD-10-CM | POA: Diagnosis not present

## 2017-12-12 DIAGNOSIS — D6489 Other specified anemias: Secondary | ICD-10-CM | POA: Diagnosis not present

## 2017-12-12 DIAGNOSIS — Z6820 Body mass index (BMI) 20.0-20.9, adult: Secondary | ICD-10-CM | POA: Diagnosis not present

## 2017-12-12 DIAGNOSIS — N183 Chronic kidney disease, stage 3 (moderate): Secondary | ICD-10-CM | POA: Diagnosis not present

## 2017-12-17 DIAGNOSIS — M5127 Other intervertebral disc displacement, lumbosacral region: Secondary | ICD-10-CM | POA: Diagnosis not present

## 2017-12-17 DIAGNOSIS — M4807 Spinal stenosis, lumbosacral region: Secondary | ICD-10-CM | POA: Diagnosis not present

## 2017-12-17 DIAGNOSIS — M6282 Rhabdomyolysis: Secondary | ICD-10-CM | POA: Diagnosis not present

## 2017-12-17 DIAGNOSIS — N390 Urinary tract infection, site not specified: Secondary | ICD-10-CM | POA: Diagnosis not present

## 2017-12-17 DIAGNOSIS — E1140 Type 2 diabetes mellitus with diabetic neuropathy, unspecified: Secondary | ICD-10-CM | POA: Diagnosis not present

## 2017-12-17 DIAGNOSIS — I482 Chronic atrial fibrillation: Secondary | ICD-10-CM | POA: Diagnosis not present

## 2017-12-19 DIAGNOSIS — I482 Chronic atrial fibrillation: Secondary | ICD-10-CM | POA: Diagnosis not present

## 2017-12-19 DIAGNOSIS — N390 Urinary tract infection, site not specified: Secondary | ICD-10-CM | POA: Diagnosis not present

## 2017-12-19 DIAGNOSIS — E1140 Type 2 diabetes mellitus with diabetic neuropathy, unspecified: Secondary | ICD-10-CM | POA: Diagnosis not present

## 2017-12-19 DIAGNOSIS — M4807 Spinal stenosis, lumbosacral region: Secondary | ICD-10-CM | POA: Diagnosis not present

## 2017-12-19 DIAGNOSIS — M5127 Other intervertebral disc displacement, lumbosacral region: Secondary | ICD-10-CM | POA: Diagnosis not present

## 2017-12-19 DIAGNOSIS — M6282 Rhabdomyolysis: Secondary | ICD-10-CM | POA: Diagnosis not present

## 2017-12-24 DIAGNOSIS — M2041 Other hammer toe(s) (acquired), right foot: Secondary | ICD-10-CM | POA: Diagnosis not present

## 2017-12-24 DIAGNOSIS — M2042 Other hammer toe(s) (acquired), left foot: Secondary | ICD-10-CM | POA: Diagnosis not present

## 2017-12-24 DIAGNOSIS — E1151 Type 2 diabetes mellitus with diabetic peripheral angiopathy without gangrene: Secondary | ICD-10-CM | POA: Diagnosis not present

## 2017-12-24 DIAGNOSIS — M89372 Hypertrophy of bone, left ankle and foot: Secondary | ICD-10-CM | POA: Diagnosis not present

## 2017-12-24 DIAGNOSIS — L600 Ingrowing nail: Secondary | ICD-10-CM | POA: Diagnosis not present

## 2017-12-26 DIAGNOSIS — M4807 Spinal stenosis, lumbosacral region: Secondary | ICD-10-CM | POA: Diagnosis not present

## 2017-12-26 DIAGNOSIS — E1140 Type 2 diabetes mellitus with diabetic neuropathy, unspecified: Secondary | ICD-10-CM | POA: Diagnosis not present

## 2017-12-26 DIAGNOSIS — N390 Urinary tract infection, site not specified: Secondary | ICD-10-CM | POA: Diagnosis not present

## 2017-12-26 DIAGNOSIS — M6282 Rhabdomyolysis: Secondary | ICD-10-CM | POA: Diagnosis not present

## 2017-12-26 DIAGNOSIS — I482 Chronic atrial fibrillation: Secondary | ICD-10-CM | POA: Diagnosis not present

## 2017-12-26 DIAGNOSIS — M5127 Other intervertebral disc displacement, lumbosacral region: Secondary | ICD-10-CM | POA: Diagnosis not present

## 2017-12-29 DIAGNOSIS — E1140 Type 2 diabetes mellitus with diabetic neuropathy, unspecified: Secondary | ICD-10-CM | POA: Diagnosis not present

## 2017-12-29 DIAGNOSIS — N390 Urinary tract infection, site not specified: Secondary | ICD-10-CM | POA: Diagnosis not present

## 2017-12-29 DIAGNOSIS — M4807 Spinal stenosis, lumbosacral region: Secondary | ICD-10-CM | POA: Diagnosis not present

## 2017-12-29 DIAGNOSIS — I482 Chronic atrial fibrillation: Secondary | ICD-10-CM | POA: Diagnosis not present

## 2017-12-29 DIAGNOSIS — M5127 Other intervertebral disc displacement, lumbosacral region: Secondary | ICD-10-CM | POA: Diagnosis not present

## 2017-12-29 DIAGNOSIS — M6282 Rhabdomyolysis: Secondary | ICD-10-CM | POA: Diagnosis not present

## 2018-01-02 DIAGNOSIS — M5127 Other intervertebral disc displacement, lumbosacral region: Secondary | ICD-10-CM | POA: Diagnosis not present

## 2018-01-02 DIAGNOSIS — N390 Urinary tract infection, site not specified: Secondary | ICD-10-CM | POA: Diagnosis not present

## 2018-01-02 DIAGNOSIS — M4807 Spinal stenosis, lumbosacral region: Secondary | ICD-10-CM | POA: Diagnosis not present

## 2018-01-02 DIAGNOSIS — I482 Chronic atrial fibrillation: Secondary | ICD-10-CM | POA: Diagnosis not present

## 2018-01-02 DIAGNOSIS — M6282 Rhabdomyolysis: Secondary | ICD-10-CM | POA: Diagnosis not present

## 2018-01-02 DIAGNOSIS — E1140 Type 2 diabetes mellitus with diabetic neuropathy, unspecified: Secondary | ICD-10-CM | POA: Diagnosis not present

## 2018-01-05 DIAGNOSIS — I482 Chronic atrial fibrillation: Secondary | ICD-10-CM | POA: Diagnosis not present

## 2018-01-05 DIAGNOSIS — N390 Urinary tract infection, site not specified: Secondary | ICD-10-CM | POA: Diagnosis not present

## 2018-01-05 DIAGNOSIS — M4807 Spinal stenosis, lumbosacral region: Secondary | ICD-10-CM | POA: Diagnosis not present

## 2018-01-05 DIAGNOSIS — E1140 Type 2 diabetes mellitus with diabetic neuropathy, unspecified: Secondary | ICD-10-CM | POA: Diagnosis not present

## 2018-01-05 DIAGNOSIS — M5127 Other intervertebral disc displacement, lumbosacral region: Secondary | ICD-10-CM | POA: Diagnosis not present

## 2018-01-05 DIAGNOSIS — M6282 Rhabdomyolysis: Secondary | ICD-10-CM | POA: Diagnosis not present

## 2018-01-07 DIAGNOSIS — E1140 Type 2 diabetes mellitus with diabetic neuropathy, unspecified: Secondary | ICD-10-CM | POA: Diagnosis not present

## 2018-01-07 DIAGNOSIS — M5127 Other intervertebral disc displacement, lumbosacral region: Secondary | ICD-10-CM | POA: Diagnosis not present

## 2018-01-07 DIAGNOSIS — N390 Urinary tract infection, site not specified: Secondary | ICD-10-CM | POA: Diagnosis not present

## 2018-01-07 DIAGNOSIS — M4807 Spinal stenosis, lumbosacral region: Secondary | ICD-10-CM | POA: Diagnosis not present

## 2018-01-07 DIAGNOSIS — I482 Chronic atrial fibrillation: Secondary | ICD-10-CM | POA: Diagnosis not present

## 2018-01-07 DIAGNOSIS — M6282 Rhabdomyolysis: Secondary | ICD-10-CM | POA: Diagnosis not present

## 2018-01-29 DIAGNOSIS — R351 Nocturia: Secondary | ICD-10-CM | POA: Diagnosis not present

## 2018-01-29 DIAGNOSIS — R3915 Urgency of urination: Secondary | ICD-10-CM | POA: Diagnosis not present

## 2018-01-29 DIAGNOSIS — R350 Frequency of micturition: Secondary | ICD-10-CM | POA: Diagnosis not present

## 2018-02-12 ENCOUNTER — Encounter: Payer: Self-pay | Admitting: Cardiovascular Disease

## 2018-02-12 ENCOUNTER — Other Ambulatory Visit: Payer: Self-pay | Admitting: Cardiovascular Disease

## 2018-02-12 ENCOUNTER — Ambulatory Visit (INDEPENDENT_AMBULATORY_CARE_PROVIDER_SITE_OTHER): Payer: Medicare Other | Admitting: Cardiovascular Disease

## 2018-02-12 VITALS — BP 154/84 | HR 56 | Ht 65.0 in | Wt 139.0 lb

## 2018-02-12 DIAGNOSIS — I5032 Chronic diastolic (congestive) heart failure: Secondary | ICD-10-CM | POA: Diagnosis not present

## 2018-02-12 DIAGNOSIS — I6521 Occlusion and stenosis of right carotid artery: Secondary | ICD-10-CM | POA: Diagnosis not present

## 2018-02-12 DIAGNOSIS — I4891 Unspecified atrial fibrillation: Secondary | ICD-10-CM | POA: Diagnosis not present

## 2018-02-12 DIAGNOSIS — I10 Essential (primary) hypertension: Secondary | ICD-10-CM | POA: Diagnosis not present

## 2018-02-12 DIAGNOSIS — E119 Type 2 diabetes mellitus without complications: Secondary | ICD-10-CM | POA: Diagnosis not present

## 2018-02-12 DIAGNOSIS — E7800 Pure hypercholesterolemia, unspecified: Secondary | ICD-10-CM

## 2018-02-12 DIAGNOSIS — I739 Peripheral vascular disease, unspecified: Secondary | ICD-10-CM

## 2018-02-12 MED ORDER — METFORMIN HCL 500 MG PO TABS: 500.0000 mg | ORAL_TABLET | Freq: Every day | ORAL | 0 refills | Status: DC

## 2018-02-12 MED ORDER — LOSARTAN POTASSIUM 50 MG PO TABS: 50.0000 mg | ORAL_TABLET | Freq: Every day | ORAL | 3 refills | Status: DC

## 2018-02-12 NOTE — Progress Notes (Signed)
Patient ID: Donna Owens, female   DOB: [birth year], 84 y.o.   MRN: [id number] Patient ID: Donna Owens, female   DOB: May 28, 1928, 84 y.o.   MRN: [id number]    Cardiology Office Note    Date:  02/15/2018   ID:  Donna Owens, DOB 1928/04/11, MRN [id number]  PCP:  Leanna Battles, MD  Cardiologist:   Sanda Klein, MD   Chief Complaint  Patient presents with  . Follow-up  . Chest Pain    still occ. chest pain off and on   . Medication Management    Pt would like to discuss metformin, not currently     History of Present Illness:  Donna Owens is a 84 y.o. female with long term persistent atrial fibrillation with slow ventricular response, diastolic heart failure, moderate carotid stenosis (Dr. Donnetta Hutching), HTN, DM, hyperlipidemia, severe neuropathy with unsteady gait and frequent falls.  Is accompanied by her daughter.  She has not had any recent falls.  Her weight loss has stopped and herglycemic control has been worse than before, with typical fingerstick blood sugar in the 220-230 range since returning home.  Glucose was 160-180 when she was a resident at [facility]. No longer taking furosemide or Benicar or metformin.  The patient specifically denies any chest pain at rest exertion, dyspnea at rest or with exertion, orthopnea, paroxysmal nocturnal dyspnea, syncope, palpitations, focal neurological deficits, intermittent claudication, lower extremity edema, unexplained weight gain, cough, hemoptysis or wheezing.   Past Medical History:  Diagnosis Date  . Anemia   . Atrophic vaginitis   . Blood transfusion without reported diagnosis   . Breast cancer, right breast ([place]) 2001  . Carotid arterial disease ([place]) 12/13/11    mild by doppler  . CVA (cerebral vascular accident) ([place])   . Hyperlipemia   . Hypertension   . OSA (obstructive sleep apnea)   . OSA on CPAP 06/20/2014  . Osteoporosis   . Pulmonary hypertension ([place])    moderate echo 11/07/10  . PVD (peripheral vascular  disease) ([place])   . Sleep apnea   . Type II diabetes mellitus ([place])     Past Surgical History:  Procedure Laterality Date  . BREAST LUMPECTOMY Right 07/26/2000   chemo/radiation  . BREAST LUMPECTOMY Left    benign  . CARDIOVERSION N/A 07/21/2014   Procedure: CARDIOVERSION;  Surgeon: Sanda Klein, MD;  Location: [facility];  Service: Cardiovascular;  Laterality: N/A;  . COLONOSCOPY  2010  . TOENAIL EXCISION  03/2013   ingrown nail    Outpatient Medications Prior to Visit  Medication Sig Dispense Refill  . acetaminophen (TYLENOL) 500 MG tablet Take 500 mg by mouth every 6 (six) hours as needed for moderate pain.    Marland Kitchen amLODipine (NORVASC) 5 MG tablet TAKE 1 TABLET(5 MG) BY MOUTH DAILY 30 tablet 9  . apixaban (ELIQUIS) 2.5 MG TABS tablet Take 1 tablet (2.5 mg total) by mouth 2 (two) times daily. 120 tablet 1  . calcium-vitamin D (OSCAL WITH D) 500-200 MG-UNIT per tablet Take 1 tablet by mouth daily with breakfast.     . Cholecalciferol (VITAMIN D-3) 1000 UNITS CAPS Take 1 capsule by mouth daily.    . Coenzyme Q10 (CO Q 10) 100 MG CAPS Take 1 capsule by mouth daily.    . ferrous sulfate 325 (65 FE) MG tablet Take 1 tablet by mouth daily.  12  . gabapentin (NEURONTIN) 300 MG capsule Take 1 capsule (300 mg total) by mouth at bedtime. (Patient taking differently:  Take 100 mg by mouth. ) 90 capsule 11  . HYDROcodone-acetaminophen (NORCO/VICODIN) 5-325 MG tablet Take 1 tablet by mouth every 6 (six) hours as needed for severe pain. 30 tablet 0  . hydrOXYzine (ATARAX/VISTARIL) 25 MG tablet Take 1 tablet (25 mg total) by mouth 3 (three) times daily as needed for itching. 30 tablet 2  . lidocaine (LIDODERM) 5 % Place 1 patch onto the skin daily. Remove & Discard patch within 12 hours or as directed by MD 30 patch 0  . Polyethyl Glycol-Propyl Glycol (SYSTANE) 0.4-0.3 % SOLN Apply 1 drop to eye every 8 (eight) hours as needed (dry eyes).    . pravastatin (PRAVACHOL) 40 MG tablet Take 40 mg by mouth  daily.    . promethazine (PHENERGAN) 25 MG tablet Take 25 mg by mouth daily. nausea    . triamcinolone cream (KENALOG) 0.1 % Apply 1 application topically 2 (two) times daily. (Patient taking differently: Apply 1 application topically 2 (two) times daily as needed (rash/sore). On left foot.) 30 g 0  . trolamine salicylate (ASPERCREME) 10 % cream Apply 1 application topically as needed for muscle pain.    . vitamin B-12 (CYANOCOBALAMIN) 1000 MCG tablet Take 1,000 mcg by mouth 2 (two) times daily.     . furosemide (LASIX) 20 MG tablet TAKE 1 TABLET BY MOUTH THREE TIMES A WEEK MONDAY, WEDNESDAY AND FRIDAY 36 tablet 3  . olmesartan (BENICAR) 20 MG tablet Take 1 tablet (20 mg total) by mouth daily.  12  . metFORMIN (GLUCOPHAGE) 500 MG tablet Take 1 tablet by mouth daily.  3   No facility-administered medications prior to visit.      Allergies:   Patient has no known allergies.   Social History   Socioeconomic History  . Marital status: Divorced    Spouse name: None  . Number of children: 1  . Years of education: BSN  . Highest education level: None  Social Needs  . Financial resource strain: None  . Food insecurity - worry: None  . Food insecurity - inability: None  . Transportation needs - medical: None  . Transportation needs - non-medical: None  Occupational History  . Occupation: RETIRED    Employer: RETIRED  Tobacco Use  . Smoking status: Never Smoker  . Smokeless tobacco: Never Used  Substance and Sexual Activity  . Alcohol use: No  . Drug use: No  . Sexual activity: No    Birth control/protection: Post-menopausal  Other Topics Concern  . None  Social History Narrative   Patient is divorced and lives alone.   Patient has one adult daughter.   Patient is retired.   Patient has a BSN degree in nursing.   Patient is right-handed.   Patient drinks one soda per week.     Family History:  The patient's family history includes Diabetes in her brother and other; Heart  failure (age of onset: 55) in her daughter; Hypertension in her brother.   ROS:   Please see the history of present illness.    Review of Systems  Constitution: Negative for chills, fever, malaise/fatigue and weight loss.  HENT: Negative for congestion and nosebleeds.   Eyes: Negative for blurred vision, vision loss in left eye and vision loss in right eye.  Cardiovascular: Positive for leg swelling. Negative for chest pain, claudication, orthopnea, paroxysmal nocturnal dyspnea and syncope.  Respiratory: Negative for cough, shortness of breath and wheezing.   Endocrine: Negative for cold intolerance, polydipsia and polyuria.  Hematologic/Lymphatic: Does not  bruise/bleed easily.  Skin: Negative for poor wound healing and rash.  Musculoskeletal: Positive for arthritis and joint pain.  Gastrointestinal: Negative for abdominal pain, change in bowel habit, heartburn, hematemesis and melena.  Genitourinary: Negative for bladder incontinence and non-menstrual bleeding.  Neurological: Positive for light-headedness, loss of balance, numbness and paresthesias. Negative for focal weakness, headaches and vertigo.  Psychiatric/Behavioral: Negative for altered mental status, depression and memory loss.   All other systems reviewed and are negative.   PHYSICAL EXAM:   VS:  BP (!) 154/84 (BP Location: Left Arm, Patient Position: Sitting, Cuff Size: Normal)   Pulse (!) 56   Ht 5\' 5"  (1.651 m)   Wt 139 lb (63 kg)   BMI 23.13 kg/m      General: Alert, oriented x3, no distress, thin Head: no evidence of trauma, PERRL, EOMI, no exophtalmos or lid lag, no myxedema, no xanthelasma; normal ears, nose and oropharynx Neck: normal jugular venous pulsations and no hepatojugular reflux; brisk carotid pulses without delay and no carotid bruits Chest: clear to auscultation, no signs of consolidation by percussion or palpation, normal fremitus, symmetrical and full respiratory excursions Cardiovascular: normal  position and quality of the apical impulse, irregular rhythm, normal first and second heart sounds, no murmurs, rubs or gallops Abdomen: no tenderness or distention, no masses by palpation, no abnormal pulsatility or arterial bruits, normal bowel sounds, no hepatosplenomegaly Extremities: no clubbing, cyanosis or edema; 2+ radial, ulnar and brachial pulses bilaterally; 2+ right femoral, posterior tibial and dorsalis pedis pulses; 2+ left femoral, posterior tibial and dorsalis pedis pulses; no subclavian or femoral bruits Neurological: grossly nonfocal Psych: Normal mood and affect    Wt Readings from Last 3 Encounters:  02/12/18 139 lb (63 kg)  10/13/17 137 lb (62.1 kg)  09/15/17 135 lb (61.2 kg)      Studies/Labs Reviewed:   EKG:  EKG is ordered today.  ECG shows atrial fibrillation with slow ventricular response, poor R wave progression across the anterior precordium, no ischemic changes.  QTc 414 ms Recent Labs: 10/12/2017: ALT 47 10/13/2017: TSH 0.437 10/17/2017: BUN 20; Creatinine, Ser 1.16; Hemoglobin 10.6; Platelets 149; Potassium 4.2; Sodium 135   Lipid Panel    Component Value Date/Time   CHOL 139 02/02/2014 1106   TRIG 47 02/02/2014 1106   HDL 54 02/02/2014 1106   CHOLHDL 2.6 02/02/2014 1106   VLDL 9 02/02/2014 1106   LDLCALC 76 02/02/2014 1106     ASSESSMENT:    1. Atrial fibrillation with slow ventricular response ([place])   2. Chronic diastolic heart failure ([place])   3. Essential hypertension   4. Hypercholesterolemia   5. Carotid stenosis, right   6. PAD (peripheral artery disease) ([place])   7. Diabetes mellitus type 2 in nonobese [facility])      PLAN:  In order of problems listed above:  1. Afib: CHADSVasc score 9. On Eliquis. Slow ventricular response without meds. Might need a pacemaker is she develops symptoms of bradycardia. 2. CHF: appears euvolemic even off diuretics, compensated (sedentary) 3. HTN: elevated BP. Restart a lower dose of ARB. Avoid  "perfect" blood pressure control since she appears to have symptoms and signs of orthostatic hypotension and falls.  4. HLP: on statin. Labs via Dr. Philip Aspen 5. Right carotid stenosis: Asymptomatic. 6. PAD: She has mostly infrapopliteal disease (bilateral two-vessel runoff, ABI 0.97 on right 0.90 on left, 2017).  Denies intermittent claudication, but this may be due to the fact that she is so sedentary. 7. DM: Control has  deteriorated.  Resume metformin. 8. Falls and early cognitive dysfunction: Thankfully without recent falls and without any further deterioration in her memory since her last appointment.    Medication Adjustments/Labs and Tests Ordered: Current medicines are reviewed at length with the patient today.  Concerns regarding medicines are outlined above.  Medication changes, Labs and Tests ordered today are listed in the Patient Instructions below. Patient Instructions  Dr Sallyanne Kuster has recommended making the following medication changes: 1. RESTART Metformin 500 mg daily 2. START Losartan 50 mg once daily  Your physician recommends that you schedule a follow-up appointment in 1 month with Kerin Ransom, PA-C.  Dr Sallyanne Kuster recommends that you schedule a follow-up appointment in 6 months. You will receive a reminder letter in the mail two months in advance. If you don't receive a letter, please call our office to schedule the follow-up appointment.  If you need a refill on your cardiac medications before your next appointment, please call your pharmacy.    Signed, Sanda Klein, MD  02/15/2018 2:14 PM    [facility] [place], [place], [place]  [address] Phone: [phone]; Fax: [phone]

## 2018-02-12 NOTE — Patient Instructions (Signed)
Dr Sallyanne Kuster has recommended making the following medication changes: 1. RESTART Metformin 500 mg daily 2. START Losartan 50 mg once daily  Your physician recommends that you schedule a follow-up appointment in 1 month with Kerin Ransom, PA-C.  Dr Sallyanne Kuster recommends that you schedule a follow-up appointment in 6 months. You will receive a reminder letter in the mail two months in advance. If you don't receive a letter, please call our office to schedule the follow-up appointment.  If you need a refill on your cardiac medications before your next appointment, please call your pharmacy.

## 2018-02-15 ENCOUNTER — Encounter: Payer: Self-pay | Admitting: Cardiovascular Disease

## 2018-02-26 DIAGNOSIS — R3915 Urgency of urination: Secondary | ICD-10-CM | POA: Diagnosis not present

## 2018-02-26 DIAGNOSIS — R350 Frequency of micturition: Secondary | ICD-10-CM | POA: Diagnosis not present

## 2018-03-02 DIAGNOSIS — M89372 Hypertrophy of bone, left ankle and foot: Secondary | ICD-10-CM | POA: Diagnosis not present

## 2018-03-02 DIAGNOSIS — L600 Ingrowing nail: Secondary | ICD-10-CM | POA: Diagnosis not present

## 2018-03-02 DIAGNOSIS — M2041 Other hammer toe(s) (acquired), right foot: Secondary | ICD-10-CM | POA: Diagnosis not present

## 2018-03-02 DIAGNOSIS — M2042 Other hammer toe(s) (acquired), left foot: Secondary | ICD-10-CM | POA: Diagnosis not present

## 2018-03-14 ENCOUNTER — Other Ambulatory Visit: Payer: Self-pay | Admitting: Cardiovascular Disease

## 2018-03-16 ENCOUNTER — Encounter (INDEPENDENT_AMBULATORY_CARE_PROVIDER_SITE_OTHER): Payer: Medicare Other | Admitting: Ophthalmology

## 2018-03-16 DIAGNOSIS — E11319 Type 2 diabetes mellitus with unspecified diabetic retinopathy without macular edema: Secondary | ICD-10-CM | POA: Diagnosis not present

## 2018-03-16 DIAGNOSIS — E113313 Type 2 diabetes mellitus with moderate nonproliferative diabetic retinopathy with macular edema, bilateral: Secondary | ICD-10-CM

## 2018-03-16 DIAGNOSIS — I10 Essential (primary) hypertension: Secondary | ICD-10-CM | POA: Diagnosis not present

## 2018-03-16 DIAGNOSIS — H43813 Vitreous degeneration, bilateral: Secondary | ICD-10-CM

## 2018-03-16 DIAGNOSIS — H35033 Hypertensive retinopathy, bilateral: Secondary | ICD-10-CM

## 2018-03-19 ENCOUNTER — Ambulatory Visit (INDEPENDENT_AMBULATORY_CARE_PROVIDER_SITE_OTHER): Payer: Medicare Other | Admitting: Adult Health

## 2018-03-19 ENCOUNTER — Encounter: Payer: Self-pay | Admitting: Adult Health

## 2018-03-19 VITALS — BP 161/73 | HR 58 | Ht 65.0 in | Wt 134.0 lb

## 2018-03-19 DIAGNOSIS — R413 Other amnesia: Secondary | ICD-10-CM

## 2018-03-19 DIAGNOSIS — I6521 Occlusion and stenosis of right carotid artery: Secondary | ICD-10-CM | POA: Diagnosis not present

## 2018-03-19 MED ORDER — MEMANTINE HCL 5 MG PO TABS: ORAL_TABLET | ORAL | 3 refills | Status: DC

## 2018-03-19 NOTE — Progress Notes (Signed)
PATIENT: Donna Owens DOB: October 09, 1928  REASON FOR VISIT: follow up HISTORY FROM: patient  HISTORY OF PRESENT ILLNESS: Today 03/19/18 Donna Owens is an 84 year old female with a history of memory disturbance.  She returns today for follow-up.  She states that since last visit she had a fall in October and went to the hospital.  She was discharged [facility] and is now living in [place] with her daughter.  He reports that they plan to move to her home in [place].  Reports that her daughter will be living with her.  She is able to complete all ADLs independently.  She does not operate a motor vehicle.  She reports that her sleep is fragmented.  Reports that she was given a prescription of gabapentin but she does not take it consistently.  She reports a good appetite.  She manages her own finances.  She now uses a cane when ambulating.  Denies any falls since October.  She is currently not on any memory medication.  She has noticed that her memory is worse since her fall.  She returns today for evaluation.  HISTORY 09/15/2017 (copy from Dr. Edwena Felty  Note), this revisit was scheduled in December 2017 and is meant to address the patient's cognitive status, she is walking with a cane, she still has a lot of dizziness. She had recent falls. In my last visit with the patient I had mentioned that I thought she was more depressed than demented. She is a breast cancer survivor, she often felt lonely and many of her friends have passed. Today's Mini-Mental Status Examination revealed 24 out of 30 points which would be cognitive impairment-mild dementia. It is interesting that the patient lost 5 points due to attention and calculation. She could do very well with backward spelling.    REVIEW OF SYSTEMS: Out of a complete 14 system review of symptoms, the patient complains only of the following symptoms, and all other reviewed systems are negative.  See HPI  ALLERGIES: No Known Allergies  HOME  MEDICATIONS: Outpatient Medications Prior to Visit  Medication Sig Dispense Refill  . acetaminophen (TYLENOL) 500 MG tablet Take 500 mg by mouth every 6 (six) hours as needed for moderate pain.    Marland Kitchen amLODipine (NORVASC) 5 MG tablet TAKE 1 TABLET(5 MG) BY MOUTH DAILY 30 tablet 9  . apixaban (ELIQUIS) 2.5 MG TABS tablet Take 1 tablet (2.5 mg total) by mouth 2 (two) times daily. 120 tablet 1  . calcium-vitamin D (OSCAL WITH D) 500-200 MG-UNIT per tablet Take 1 tablet by mouth daily with breakfast.     . Cholecalciferol (VITAMIN D-3) 1000 UNITS CAPS Take 1 capsule by mouth daily.    . Coenzyme Q10 (CO Q 10) 100 MG CAPS Take 1 capsule by mouth daily.    . ferrous sulfate 325 (65 FE) MG tablet Take 1 tablet by mouth daily.  12  . gabapentin (NEURONTIN) 300 MG capsule Take 1 capsule (300 mg total) by mouth at bedtime. (Patient taking differently: Take 100 mg by mouth. ) 90 capsule 11  . HYDROcodone-acetaminophen (NORCO/VICODIN) 5-325 MG tablet Take 1 tablet by mouth every 6 (six) hours as needed for severe pain. 30 tablet 0  . hydrOXYzine (ATARAX/VISTARIL) 25 MG tablet Take 1 tablet (25 mg total) by mouth 3 (three) times daily as needed for itching. 30 tablet 2  . lidocaine (LIDODERM) 5 % Place 1 patch onto the skin daily. Remove & Discard patch within 12 hours or as directed by  MD 30 patch 0  . losartan (COZAAR) 50 MG tablet Take 1 tablet (50 mg total) by mouth daily. 90 tablet 3  . metFORMIN (GLUCOPHAGE) 500 MG tablet TAKE 1 TABLET BY MOUTH DAILY 30 tablet 9  . Polyethyl Glycol-Propyl Glycol (SYSTANE) 0.4-0.3 % SOLN Apply 1 drop to eye every 8 (eight) hours as needed (dry eyes).    . pravastatin (PRAVACHOL) 40 MG tablet Take 40 mg by mouth daily.    . promethazine (PHENERGAN) 25 MG tablet Take 25 mg by mouth daily. nausea    . triamcinolone cream (KENALOG) 0.1 % Apply 1 application topically 2 (two) times daily. (Patient taking differently: Apply 1 application topically 2 (two) times daily as needed  (rash/sore). On left foot.) 30 g 0  . trolamine salicylate (ASPERCREME) 10 % cream Apply 1 application topically as needed for muscle pain.    . vitamin B-12 (CYANOCOBALAMIN) 1000 MCG tablet Take 1,000 mcg by mouth 2 (two) times daily.      No facility-administered medications prior to visit.     PAST MEDICAL HISTORY: Past Medical History:  Diagnosis Date  . Anemia   . Atrophic vaginitis   . Blood transfusion without reported diagnosis   . Breast cancer, right breast ([place]) 2001  . Carotid arterial disease ([place]) 12/13/11    mild by doppler  . CVA (cerebral vascular accident) ([place])   . Hyperlipemia   . Hypertension   . OSA (obstructive sleep apnea)   . OSA on CPAP 06/20/2014  . Osteoporosis   . Pulmonary hypertension ([facility] [place])    moderate echo 11/07/10  . PVD (peripheral vascular disease) ([place])   . Sleep apnea   . Type II diabetes mellitus ([place])     PAST SURGICAL HISTORY: Past Surgical History:  Procedure Laterality Date  . BREAST LUMPECTOMY Right 07/26/2000   chemo/radiation  . BREAST LUMPECTOMY Left    benign  . CARDIOVERSION N/A 07/21/2014   Procedure: CARDIOVERSION;  Surgeon: Sanda Klein, MD;  Location: [facility];  Service: Cardiovascular;  Laterality: N/A;  . COLONOSCOPY  2010  . TOENAIL EXCISION  03/2013   ingrown nail    FAMILY HISTORY: Family History  Problem Relation Age of Onset  . Diabetes Brother   . Hypertension Brother   . Heart failure Daughter 3  . Diabetes Other     SOCIAL HISTORY: Social History   Socioeconomic History  . Marital status: Divorced    Spouse name: Not on file  . Number of children: 1  . Years of education: BSN  . Highest education level: Not on file  Occupational History  . Occupation: RETIRED    Employer: RETIRED  Social Needs  . Financial resource strain: Not on file  . Food insecurity:    Worry: Not on file    Inability: Not on file  . Transportation needs:    Medical: Not on file    Non-medical: Not on file    Tobacco Use  . Smoking status: Never Smoker  . Smokeless tobacco: Never Used  Substance and Sexual Activity  . Alcohol use: No  . Drug use: No  . Sexual activity: Never    Birth control/protection: Post-menopausal  Lifestyle  . Physical activity:    Days per week: Not on file    Minutes per session: Not on file  . Stress: Not on file  Relationships  . Social connections:    Talks on phone: Not on file    Gets together: Not on file    Attends  religious service: Not on file    Active member of club or organization: Not on file    Attends meetings of clubs or organizations: Not on file    Relationship status: Not on file  . Intimate partner violence:    Fear of current or ex partner: Not on file    Emotionally abused: Not on file    Physically abused: Not on file    Forced sexual activity: Not on file  Other Topics Concern  . Not on file  Social History Narrative   Patient is divorced and lives alone.   Patient has one adult daughter.   Patient is retired.   Patient has a BSN degree in nursing.   Patient is right-handed.   Patient drinks one soda per week.      PHYSICAL EXAM  Vitals:   03/19/18 0956  BP: (!) 161/73  Pulse: (!) 58  Weight: 134 lb (60.8 kg)  Height: 5\' 5"  (1.651 m)   Body mass index is 22.3 kg/m.   MMSE - Mini Mental State Exam 03/19/2018 09/15/2017 11/27/2016  Orientation to time 4 4 5   Orientation to Place 2 4 5   Registration 3 3 3   Attention/ Calculation 0 4 5  Attention/Calculation-comments - couldn do numbers -  Recall 1 1 0  Language- name 2 objects 2 2 2   Language- repeat 1 1 1   Language- follow 3 step command 1 3 2   Language- read & follow direction 1 1 1   Write a sentence 1 1 1   Copy design 0 0 1  Total score 16 24 26      Generalized: Well developed, in no acute distress   Neurological examination  Mentation: Alert oriented to time, place, history taking. Follows all commands speech and language fluent Cranial nerve II-XII:  Pupils were equal round reactive to light. Extraocular movements were full, visual field were full on confrontational test. Facial sensation and strength were normal. Uvula tongue midline. Head turning and shoulder shrug  were normal and symmetric. Motor: The motor testing reveals 5 over 5 strength of all 4 extremities. Good symmetric motor tone is noted throughout.  Sensory: Sensory testing is intact to soft touch on all 4 extremities. No evidence of extinction is noted.  Coordination: Cerebellar testing reveals good finger-nose-finger and heel-to-shin bilaterally.  Gait and station: Patient uses a cane.  Tandem gait not attempted..  Reflexes: Deep tendon reflexes are symmetric and normal bilaterally.   DIAGNOSTIC DATA (LABS, IMAGING, TESTING) - I reviewed patient records, labs, notes, testing and imaging myself where available.  Lab Results  Component Value Date   WBC 7.8 10/17/2017   HGB 10.6 (L) 10/17/2017   HCT 33.3 (L) 10/17/2017   MCV 89.5 10/17/2017   PLT 149 (L) 10/17/2017      Component Value Date/Time   NA 135 10/17/2017 0631   NA 139 06/21/2015 1358   K 4.2 10/17/2017 0631   CL 103 10/17/2017 0631   CO2 23 10/17/2017 0631   GLUCOSE 119 (H) 10/17/2017 0631   BUN 20 10/17/2017 0631   BUN 33 (H) 06/21/2015 1358   CREATININE 1.16 (H) 10/17/2017 0631   CREATININE 1.23 (H) 10/05/2015 1151   CALCIUM 8.9 10/17/2017 0631   PROT 8.5 (H) 10/12/2017 1446   PROT 6.7 06/21/2015 1358   ALBUMIN 3.7 10/12/2017 1446   ALBUMIN 4.1 06/21/2015 1358   AST 117 (H) 10/12/2017 1446   ALT 47 10/12/2017 1446   ALKPHOS 101 10/12/2017 1446   BILITOT 1.4 (H)  10/12/2017 1446   BILITOT 0.3 06/21/2015 1358   GFRNONAA 41 (L) 10/17/2017 0631   GFRNONAA 45 (L) 06/29/2014 1607   GFRAA 48 (L) 10/17/2017 0631   GFRAA 52 (L) 06/29/2014 1607   Lab Results  Component Value Date   CHOL 139 02/02/2014   HDL 54 02/02/2014   LDLCALC 76 02/02/2014   TRIG 47 02/02/2014   CHOLHDL 2.6 02/02/2014   Lab  Results  Component Value Date   HGBA1C 5.4 02/02/2014   Lab Results  Component Value Date   VITAMINB12 3,005 (H) 10/13/2017   Lab Results  Component Value Date   TSH 0.437 10/13/2017      ASSESSMENT AND PLAN 84 y.o. year old female  has a past medical history of Anemia, Atrophic vaginitis, Blood transfusion without reported diagnosis, Breast cancer, right breast ([place]) (2001), Carotid arterial disease ([place]) (12/13/11), CVA (cerebral vascular accident) ([place]), Hyperlipemia, Hypertension, OSA (obstructive sleep apnea), OSA on CPAP (06/20/2014), Osteoporosis, Pulmonary hypertension ([place]), PVD (peripheral vascular disease) ([place]), Sleep apnea, and Type II diabetes mellitus ([place]). here with :  1.  Memory disturbance  The patient's memory score has declined since the last visit.  We discussed potentially starting Namenda.  He is amenable to this.  She will begin by taking 5 mg daily for 1 week then increasing to 5 mg twice a day thereafter.  I reviewed potential side effects with the patient and provided her with a handout.  She is advised that if her symptoms worsen or she develops new symptoms she should let us know.  She will follow-up in 6 months or sooner if needed.  I spent 15 minutes with the patient. 50% of this time was spent discussing memory score   Ward Givens, MSN, NP-C 03/19/2018, 10:23 AM [facility] [address] [place], [place] [address] [phone]

## 2018-03-19 NOTE — Patient Instructions (Addendum)
Your Plan:  Memory score is slightly declined. Start Namenda 5 mg- take 1 tablet daily for 1 week then increase to 1 tablet twice a day thereafter If your symptoms worsen or you develop new symptoms please let us know.   Thank you for coming to see [place] at [facility]. I hope we have been able to provide you high quality care today.  You may receive a patient satisfaction survey over the next few weeks. We would appreciate your feedback and comments so that we may continue to improve ourselves and the health of our patients.  Memantine Tablets What is this medicine? MEMANTINE (MEM an teen) is used to treat dementia caused by Alzheimer's disease. This medicine may be used for other purposes; ask your health care provider or pharmacist if you have questions. COMMON BRAND NAME(S): Namenda What should I tell my health care provider before I take this medicine? They need to know if you have any of these conditions: -difficulty passing urine -kidney disease -liver disease -seizures -an unusual or allergic reaction to memantine, other medicines, foods, dyes, or preservatives -pregnant or trying to get pregnant -breast-feeding How should I use this medicine? Take this medicine by mouth with a glass of water. Follow the directions on the prescription label. You may take this medicine with or without food. Take your doses at regular intervals. Do not take your medicine more often than directed. Continue to take your medicine even if you feel better. Do not stop taking except on the advice of your doctor or health care professional. Talk to your pediatrician regarding the use of this medicine in children. Special care may be needed. Overdosage: If you think you have taken too much of this medicine contact a poison control center or emergency room at once. NOTE: This medicine is only for you. Do not share this medicine with others. What if I miss a dose? If you miss a dose, take it  as soon as you can. If it is almost time for your next dose, take only that dose. Do not take double or extra doses. If you do not take your medicine for several days, contact your health care provider. Your dose may need to be changed. What may interact with this medicine? -acetazolamide -amantadine -cimetidine -dextromethorphan -dofetilide -hydrochlorothiazide -ketamine -metformin -methazolamide -quinidine -ranitidine -sodium bicarbonate -triamterene This list may not describe all possible interactions. Give your health care provider a list of all the medicines, herbs, non-prescription drugs, or dietary supplements you use. Also tell them if you smoke, drink alcohol, or use illegal drugs. Some items may interact with your medicine. What should I watch for while using this medicine? Visit your doctor or health care professional for regular checks on your progress. Check with your doctor or health care professional if there is no improvement in your symptoms or if they get worse. You may get drowsy or dizzy. Do not drive, use machinery, or do anything that needs mental alertness until you know how this drug affects you. Do not stand or sit up quickly, especially if you are an older patient. This reduces the risk of dizzy or fainting spells. Alcohol can make you more drowsy and dizzy. Avoid alcoholic drinks. What side effects may I notice from receiving this medicine? Side effects that you should report to your doctor or health care professional as soon as possible: -allergic reactions like skin rash, itching or hives, swelling of the face, lips, or tongue -agitation or a feeling of restlessness -depressed  mood -dizziness -hallucinations -redness, blistering, peeling or loosening of the skin, including inside the mouth -seizures -vomiting Side effects that usually do not require medical attention (report to your doctor or health care professional if they continue or are  bothersome): -constipation -diarrhea -headache -nausea -trouble sleeping This list may not describe all possible side effects. Call your doctor for medical advice about side effects. You may report side effects to FDA at 1-800-FDA-1088. Where should I keep my medicine? Keep out of the reach of children. Store at room temperature between 15 degrees and 30 degrees C (59 degrees and 86 degrees F). Throw away any unused medicine after the expiration date. NOTE: This sheet is a summary. It may not cover all possible information. If you have questions about this medicine, talk to your doctor, pharmacist, or health care provider.  2018 Elsevier/Gold Standard (2013-09-27 14:10:42)

## 2018-03-20 ENCOUNTER — Encounter: Payer: Self-pay | Admitting: Cardiology

## 2018-03-20 ENCOUNTER — Ambulatory Visit (INDEPENDENT_AMBULATORY_CARE_PROVIDER_SITE_OTHER): Payer: Medicare Other | Admitting: Cardiology

## 2018-03-20 DIAGNOSIS — Z7901 Long term (current) use of anticoagulants: Secondary | ICD-10-CM

## 2018-03-20 DIAGNOSIS — I5032 Chronic diastolic (congestive) heart failure: Secondary | ICD-10-CM

## 2018-03-20 DIAGNOSIS — E119 Type 2 diabetes mellitus without complications: Secondary | ICD-10-CM | POA: Diagnosis not present

## 2018-03-20 DIAGNOSIS — I10 Essential (primary) hypertension: Secondary | ICD-10-CM

## 2018-03-20 DIAGNOSIS — I4891 Unspecified atrial fibrillation: Secondary | ICD-10-CM

## 2018-03-20 DIAGNOSIS — I6521 Occlusion and stenosis of right carotid artery: Secondary | ICD-10-CM

## 2018-03-20 NOTE — Patient Instructions (Signed)
Medication Instructions: Your physician recommends that you continue on your current medications as directed. Please refer to the Current Medication list given to you today.  If you need a refill on your cardiac medications before your next appointment, please call your pharmacy.   Follow-Up: Your physician wants you to follow-up in 6 months with Dr. Sallyanne Kuster.   Thank you for choosing [facility] at [facility]!!

## 2018-03-20 NOTE — Assessment & Plan Note (Signed)
Glucophage resumed by Dr Sallyanne Kuster at [facility]

## 2018-03-20 NOTE — Assessment & Plan Note (Signed)
CHADs VASc=9. History of falls but none recently

## 2018-03-20 NOTE — Progress Notes (Signed)
03/20/2018 Donna Owens   1928-11-01  [id number]  Primary Physician Leanna Battles, MD Primary Cardiologist: Dr Sallyanne Kuster  HPI:  Pleasant 84 y/o female with a history of AF with slow VR, DM, and HTN. She has had past falls but this has been stable recently. She is on Eliquis. She lives with her daughter. Dr Sallyanne Kuster saw her 02/12/18. She is in the offcie today fo a one month f/u. She is doing well, no complaints, no falls. She resumed her Glucophage as per Dr Victorino December instructions.    Current Outpatient Medications  Medication Sig Dispense Refill  . acetaminophen (TYLENOL) 500 MG tablet Take 500 mg by mouth every 6 (six) hours as needed for moderate pain.    Marland Kitchen amLODipine (NORVASC) 5 MG tablet TAKE 1 TABLET(5 MG) BY MOUTH DAILY 30 tablet 9  . apixaban (ELIQUIS) 2.5 MG TABS tablet Take 1 tablet (2.5 mg total) by mouth 2 (two) times daily. 120 tablet 1  . calcium-vitamin D (OSCAL WITH D) 500-200 MG-UNIT per tablet Take 1 tablet by mouth daily with breakfast.     . Cholecalciferol (VITAMIN D-3) 1000 UNITS CAPS Take 1 capsule by mouth daily.    . Coenzyme Q10 (CO Q 10) 100 MG CAPS Take 1 capsule by mouth daily.    . ferrous sulfate 325 (65 FE) MG tablet Take 1 tablet by mouth daily.  12  . gabapentin (NEURONTIN) 300 MG capsule Take 1 capsule (300 mg total) by mouth at bedtime. (Patient taking differently: Take 300 mg by mouth. ) 90 capsule 11  . hydrOXYzine (ATARAX/VISTARIL) 25 MG tablet Take 1 tablet (25 mg total) by mouth 3 (three) times daily as needed for itching. 30 tablet 2  . lidocaine (LIDODERM) 5 % Place 1 patch onto the skin daily. Remove & Discard patch within 12 hours or as directed by MD 30 patch 0  . memantine (NAMENDA) 5 MG tablet Take 1 tab PO daily for 1 week then increase to 1 tab BID thereafter 180 tablet 3  . metFORMIN (GLUCOPHAGE) 500 MG tablet TAKE 1 TABLET BY MOUTH DAILY 30 tablet 9  . Polyethyl Glycol-Propyl Glycol (SYSTANE) 0.4-0.3 % SOLN Apply 1 drop to eye every  8 (eight) hours as needed (dry eyes).    . pravastatin (PRAVACHOL) 40 MG tablet Take 40 mg by mouth daily.    . vitamin B-12 (CYANOCOBALAMIN) 1000 MCG tablet Take 1,000 mcg by mouth 2 (two) times daily.     Marland Kitchen HYDROcodone-acetaminophen (NORCO/VICODIN) 5-325 MG tablet Take 1 tablet by mouth every 6 (six) hours as needed for severe pain. (Patient not taking: Reported on 03/20/2018) 30 tablet 0  . losartan (COZAAR) 50 MG tablet Take 1 tablet (50 mg total) by mouth daily. (Patient not taking: Reported on 03/20/2018) 90 tablet 3  . promethazine (PHENERGAN) 25 MG tablet Take 25 mg by mouth daily. nausea    . triamcinolone cream (KENALOG) 0.1 % Apply 1 application topically 2 (two) times daily. (Patient not taking: Reported on 03/20/2018) 30 g 0  . trolamine salicylate (ASPERCREME) 10 % cream Apply 1 application topically as needed for muscle pain.     No current facility-administered medications for this visit.     No Known Allergies  Past Medical History:  Diagnosis Date  . Anemia   . Atrophic vaginitis   . Blood transfusion without reported diagnosis   . Breast cancer, right breast ([place]) 2001  . Carotid arterial disease ([place]) 12/13/11    mild by doppler  .  CVA (cerebral vascular accident) ([place])   . Hyperlipemia   . Hypertension   . OSA (obstructive sleep apnea)   . OSA on CPAP 06/20/2014  . Osteoporosis   . Pulmonary hypertension ([place])    moderate echo 11/07/10  . PVD (peripheral vascular disease) ([place])   . Sleep apnea   . Type II diabetes mellitus ([place])     Social History   Socioeconomic History  . Marital status: Divorced    Spouse name: Not on file  . Number of children: 1  . Years of education: BSN  . Highest education level: Not on file  Occupational History  . Occupation: RETIRED    Employer: RETIRED  Social Needs  . Financial resource strain: Not on file  . Food insecurity:    Worry: Not on file    Inability: Not on file  . Transportation needs:    Medical: Not on  file    Non-medical: Not on file  Tobacco Use  . Smoking status: Never Smoker  . Smokeless tobacco: Never Used  Substance and Sexual Activity  . Alcohol use: No  . Drug use: No  . Sexual activity: Never    Birth control/protection: Post-menopausal  Lifestyle  . Physical activity:    Days per week: Not on file    Minutes per session: Not on file  . Stress: Not on file  Relationships  . Social connections:    Talks on phone: Not on file    Gets together: Not on file    Attends religious service: Not on file    Active member of club or organization: Not on file    Attends meetings of clubs or organizations: Not on file    Relationship status: Not on file  . Intimate partner violence:    Fear of current or ex partner: Not on file    Emotionally abused: Not on file    Physically abused: Not on file    Forced sexual activity: Not on file  Other Topics Concern  . Not on file  Social History Narrative   Patient is divorced and lives alone.   Patient has one adult daughter.   Patient is retired.   Patient has a BSN degree in nursing.   Patient is right-handed.   Patient drinks one soda per week.     Family History  Problem Relation Age of Onset  . Diabetes Brother   . Hypertension Brother   . Heart failure Daughter 81  . Diabetes Other      Review of Systems: General: negative for chills, fever, night sweats or weight changes.  Cardiovascular: negative for chest pain, dyspnea on exertion, edema, orthopnea, palpitations, paroxysmal nocturnal dyspnea or shortness of breath Dermatological: negative for rash Respiratory: negative for cough or wheezing Urologic: negative for hematuria Abdominal: negative for nausea, vomiting, diarrhea, bright red blood per rectum, melena, or hematemesis Neurologic: negative for visual changes, syncope, or dizziness All other systems reviewed and are otherwise negative except as noted above.    Blood pressure 134/78, pulse 68, height 5'  5" (1.651 m), weight 134 lb 3.2 oz (60.9 kg).  General appearance: alert, cooperative, no distress and thin, well dressed, groomed, uses a cane Lungs: decreased Lt base Heart: irregularly irregular rhythm and 2/6 sytolic murmur LSB Extremities: extremities normal, atraumatic, no cyanosis or edema Skin: Skin color, texture, turgor normal. No rashes or lesions Neurologic: Grossly normal   ASSESSMENT AND PLAN:   Atrial fibrillation with slow ventricular response (HCC) Asymptomatic- rate  60 on no AV nodal blocking agents  Chronic anticoagulation CHADs VASc=9. History of falls but none recently  Chronic diastolic heart failure (HCC) Stable  Non-insulin dependent type 2 diabetes mellitus ([place]) Glucophage resumed by Dr Sallyanne Kuster at [place] [facility] Controlled   PLAN  Same Rx, f/u 6 months with dr Sallyanne Kuster.  Kerin Ransom PA-C 03/20/2018 10:25 AM

## 2018-03-20 NOTE — Assessment & Plan Note (Signed)
Controlled.  

## 2018-03-20 NOTE — Assessment & Plan Note (Signed)
Asymptomatic- rate 60 on no AV nodal blocking agents

## 2018-03-20 NOTE — Assessment & Plan Note (Signed)
Stable

## 2018-03-20 NOTE — Progress Notes (Signed)
I reviewed  the assessment and plan as directed by NP .The patient is known to me . She has declined in cognitive function - following a fall without TBI. Attention and calculation are the most affected parts of the MMSE.  Diagnosis is now dementia- type not specified.  Evaluate for anosmia, abnormal eye movements, MRI/ CT changes indicating vascular dementia and follow every 6 month.     Pricella Gaugh, MD

## 2018-03-26 DIAGNOSIS — R350 Frequency of micturition: Secondary | ICD-10-CM | POA: Diagnosis not present

## 2018-03-26 DIAGNOSIS — R3915 Urgency of urination: Secondary | ICD-10-CM | POA: Diagnosis not present

## 2018-03-26 DIAGNOSIS — R351 Nocturia: Secondary | ICD-10-CM | POA: Diagnosis not present

## 2018-04-02 DIAGNOSIS — H04123 Dry eye syndrome of bilateral lacrimal glands: Secondary | ICD-10-CM | POA: Diagnosis not present

## 2018-04-02 DIAGNOSIS — H10413 Chronic giant papillary conjunctivitis, bilateral: Secondary | ICD-10-CM | POA: Diagnosis not present

## 2018-04-02 DIAGNOSIS — Z961 Presence of intraocular lens: Secondary | ICD-10-CM | POA: Diagnosis not present

## 2018-04-06 ENCOUNTER — Other Ambulatory Visit: Payer: Self-pay | Admitting: Cardiovascular Disease

## 2018-04-06 NOTE — Telephone Encounter (Signed)
Rx has been sent to the pharmacy electronically. ° °

## 2018-04-08 DIAGNOSIS — Z6821 Body mass index (BMI) 21.0-21.9, adult: Secondary | ICD-10-CM | POA: Diagnosis not present

## 2018-04-08 DIAGNOSIS — H9193 Unspecified hearing loss, bilateral: Secondary | ICD-10-CM | POA: Diagnosis not present

## 2018-04-08 DIAGNOSIS — M5137 Other intervertebral disc degeneration, lumbosacral region: Secondary | ICD-10-CM | POA: Diagnosis not present

## 2018-04-08 DIAGNOSIS — H6123 Impacted cerumen, bilateral: Secondary | ICD-10-CM | POA: Diagnosis not present

## 2018-04-08 DIAGNOSIS — M545 Low back pain: Secondary | ICD-10-CM | POA: Diagnosis not present

## 2018-04-14 ENCOUNTER — Other Ambulatory Visit: Payer: Self-pay | Admitting: *Deleted

## 2018-04-14 MED ORDER — APIXABAN 2.5 MG PO TABS: 2.5000 mg | ORAL_TABLET | Freq: Two times a day (BID) | ORAL | 2 refills | Status: DC

## 2018-04-14 NOTE — Telephone Encounter (Signed)
Rx has been sent to the pharmacy electronically. ° °

## 2018-05-14 DIAGNOSIS — R3915 Urgency of urination: Secondary | ICD-10-CM | POA: Diagnosis not present

## 2018-05-14 DIAGNOSIS — R350 Frequency of micturition: Secondary | ICD-10-CM | POA: Diagnosis not present

## 2018-05-14 DIAGNOSIS — R339 Retention of urine, unspecified: Secondary | ICD-10-CM | POA: Diagnosis not present

## 2018-06-15 ENCOUNTER — Encounter: Payer: Self-pay | Admitting: Cardiovascular Disease

## 2018-06-15 ENCOUNTER — Ambulatory Visit (INDEPENDENT_AMBULATORY_CARE_PROVIDER_SITE_OTHER): Payer: Medicare Other | Admitting: Cardiovascular Disease

## 2018-06-15 VITALS — BP 126/66 | HR 51 | Ht 65.0 in | Wt 132.0 lb

## 2018-06-15 DIAGNOSIS — I739 Peripheral vascular disease, unspecified: Secondary | ICD-10-CM

## 2018-06-15 DIAGNOSIS — E119 Type 2 diabetes mellitus without complications: Secondary | ICD-10-CM | POA: Diagnosis not present

## 2018-06-15 DIAGNOSIS — R55 Syncope and collapse: Secondary | ICD-10-CM

## 2018-06-15 DIAGNOSIS — W19XXXA Unspecified fall, initial encounter: Secondary | ICD-10-CM | POA: Diagnosis not present

## 2018-06-15 DIAGNOSIS — E7800 Pure hypercholesterolemia, unspecified: Secondary | ICD-10-CM

## 2018-06-15 DIAGNOSIS — I5032 Chronic diastolic (congestive) heart failure: Secondary | ICD-10-CM | POA: Diagnosis not present

## 2018-06-15 DIAGNOSIS — I4891 Unspecified atrial fibrillation: Secondary | ICD-10-CM

## 2018-06-15 DIAGNOSIS — I6521 Occlusion and stenosis of right carotid artery: Secondary | ICD-10-CM

## 2018-06-15 DIAGNOSIS — I10 Essential (primary) hypertension: Secondary | ICD-10-CM

## 2018-06-15 MED ORDER — LOSARTAN POTASSIUM 25 MG PO TABS: 25.0000 mg | ORAL_TABLET | Freq: Every day | ORAL | 3 refills | Status: DC

## 2018-06-15 NOTE — Patient Instructions (Addendum)
Medication Instructions: Dr Sallyanne Kuster has recommended making the following medication changes: 1. DECREASE Losartan to 25 mg daily  Please use the following medications with caution and ONLY with supervision:  Hydrocodone-acetaminophen  Hydroxyzine  Promethazine   Labwork: NONE ORDERED  Testing/Procedures: 1. 48-hour Holter Monitor - Your physician has recommended that you wear a holter monitor. Holter monitors are medical devices that record the heart's electrical activity. Doctors most often use these monitors to diagnose arrhythmias. Arrhythmias are problems with the speed or rhythm of the heartbeat. The monitor is a small, portable device. You can wear one while you do your normal daily activities. This is usually used to diagnose what is causing palpitations/syncope (passing out). >>This will be placed at our [facility] location [place], [place] [address] [phone]  Follow-up: Dr Sallyanne Kuster recommends that you schedule a follow-up appointment in 6 months. You will receive a reminder letter in the mail two months in advance. If you don't receive a letter, please call our office to schedule the follow-up appointment.  If you need a refill on your cardiac medications before your next appointment, please call your pharmacy.

## 2018-06-15 NOTE — Progress Notes (Signed)
Patient ID: Donna Owens, female   DOB: [birth year], 84 y.o.   MRN: [id number] Patient ID: Donna Owens, female   DOB: 1928/08/31, 84 y.o.   MRN: [id number]    Cardiology Office Note    Date:  06/16/2018   ID:  Donna Owens, DOB 12-21-1928, MRN [id number]  PCP:  Leanna Battles, MD  Cardiologist:   Sanda Klein, MD   No chief complaint on file.   History of Present Illness:  Donna Owens is a 84 y.o. female with long term persistent atrial fibrillation with slow ventricular response, diastolic heart failure, moderate carotid stenosis (Dr. Donnetta Hutching), HTN, DM, hyperlipidemia, severe neuropathy with unsteady gait and frequent falls.  As almost always, she is accompanied by her daughter.  June 22 she had her first fall in the last year.  She was groggy and may be a little disoriented and weak that day.  Thankfully she did not have any serious injury or any bleeding. She did not have loss of consciousness.  Her medication list includes possible culprit agents such as Vicodin, lidocaine patch, eyedrops.  No clear relation to any of those age meds on that day  The patient specifically denies any chest pain at rest exertion, dyspnea at rest or with exertion, orthopnea, paroxysmal nocturnal dyspnea, syncope, palpitations, focal neurological deficits, intermittent claudication, lower extremity edema, unexplained weight gain, cough, hemoptysis or wheezing.  She presents today with atrial fibrillation with slow ventricular response at about 51 bpm.  She does not feel weak or dizzy today.  She is not on any medications with negative chronotropic effect (other than memantine).   Past Medical History:  Diagnosis Date  . Anemia   . Atrophic vaginitis   . Blood transfusion without reported diagnosis   . Breast cancer, right breast ([place]) 2001  . Carotid arterial disease ([place]) 12/13/11    mild by doppler  . CVA (cerebral vascular accident) ([place])   . Hyperlipemia   . Hypertension   . OSA  (obstructive sleep apnea)   . OSA on CPAP 06/20/2014  . Osteoporosis   . Pulmonary hypertension ([place])    moderate echo 11/07/10  . PVD (peripheral vascular disease) ([place])   . Sleep apnea   . Type II diabetes mellitus ([place])     Past Surgical History:  Procedure Laterality Date  . BREAST LUMPECTOMY Right 07/26/2000   chemo/radiation  . BREAST LUMPECTOMY Left    benign  . CARDIOVERSION N/A 07/21/2014   Procedure: CARDIOVERSION;  Surgeon: Sanda Klein, MD;  Location: [facility];  Service: Cardiovascular;  Laterality: N/A;  . COLONOSCOPY  2010  . TOENAIL EXCISION  03/2013   ingrown nail    Outpatient Medications Prior to Visit  Medication Sig Dispense Refill  . acetaminophen (TYLENOL) 500 MG tablet Take 500 mg by mouth every 6 (six) hours as needed for moderate pain.    Marland Kitchen amLODipine (NORVASC) 5 MG tablet TAKE 1 TABLET(5 MG) BY MOUTH DAILY 90 tablet 2  . apixaban (ELIQUIS) 2.5 MG TABS tablet Take 1 tablet (2.5 mg total) by mouth 2 (two) times daily. 180 tablet 2  . calcium-vitamin D (OSCAL WITH D) 500-200 MG-UNIT per tablet Take 1 tablet by mouth daily with breakfast.     . Cholecalciferol (VITAMIN D-3) 1000 UNITS CAPS Take 1 capsule by mouth daily.    . Coenzyme Q10 (CO Q 10) 100 MG CAPS Take 1 capsule by mouth daily.    . ferrous sulfate 325 (65 FE) MG tablet Take 1 tablet  by mouth daily.  12  . gabapentin (NEURONTIN) 300 MG capsule Take 1 capsule (300 mg total) by mouth at bedtime. (Patient taking differently: Take 300 mg by mouth. ) 90 capsule 11  . HYDROcodone-acetaminophen (NORCO/VICODIN) 5-325 MG tablet Take 1 tablet by mouth every 6 (six) hours as needed for severe pain. 30 tablet 0  . hydrOXYzine (ATARAX/VISTARIL) 25 MG tablet Take 1 tablet (25 mg total) by mouth 3 (three) times daily as needed for itching. 30 tablet 2  . lidocaine (LIDODERM) 5 % Place 1 patch onto the skin daily. Remove & Discard patch within 12 hours or as directed by MD 30 patch 0  . memantine (NAMENDA) 5  MG tablet Take 1 tab PO daily for 1 week then increase to 1 tab BID thereafter 180 tablet 3  . metFORMIN (GLUCOPHAGE) 500 MG tablet TAKE 1 TABLET BY MOUTH DAILY 30 tablet 9  . Polyethyl Glycol-Propyl Glycol (SYSTANE) 0.4-0.3 % SOLN Apply 1 drop to eye every 8 (eight) hours as needed (dry eyes).    . pravastatin (PRAVACHOL) 40 MG tablet Take 40 mg by mouth daily.    . promethazine (PHENERGAN) 25 MG tablet Take 25 mg by mouth daily. nausea    . triamcinolone cream (KENALOG) 0.1 % Apply 1 application topically 2 (two) times daily. 30 g 0  . trolamine salicylate (ASPERCREME) 10 % cream Apply 1 application topically as needed for muscle pain.    . vitamin B-12 (CYANOCOBALAMIN) 1000 MCG tablet Take 1,000 mcg by mouth 2 (two) times daily.     Marland Kitchen losartan (COZAAR) 50 MG tablet Take 1 tablet (50 mg total) by mouth daily. 90 tablet 3   No facility-administered medications prior to visit.      Allergies:   Patient has no known allergies.   Social History   Socioeconomic History  . Marital status: Divorced    Spouse name: Not on file  . Number of children: 1  . Years of education: BSN  . Highest education level: Not on file  Occupational History  . Occupation: RETIRED    Employer: RETIRED  Social Needs  . Financial resource strain: Not on file  . Food insecurity:    Worry: Not on file    Inability: Not on file  . Transportation needs:    Medical: Not on file    Non-medical: Not on file  Tobacco Use  . Smoking status: Never Smoker  . Smokeless tobacco: Never Used  Substance and Sexual Activity  . Alcohol use: No  . Drug use: No  . Sexual activity: Never    Birth control/protection: Post-menopausal  Lifestyle  . Physical activity:    Days per week: Not on file    Minutes per session: Not on file  . Stress: Not on file  Relationships  . Social connections:    Talks on phone: Not on file    Gets together: Not on file    Attends religious service: Not on file    Active member of  club or organization: Not on file    Attends meetings of clubs or organizations: Not on file    Relationship status: Not on file  Other Topics Concern  . Not on file  Social History Narrative   Patient is divorced and lives alone.   Patient has one adult daughter.   Patient is retired.   Patient has a BSN degree in nursing.   Patient is right-handed.   Patient drinks one soda per week.  Family History:  The patient's family history includes Diabetes in her brother and other; Heart failure (age of onset: 51) in her daughter; Hypertension in her brother.   ROS:   Please see the history of present illness.    Review of Systems  Constitution: Negative for chills, fever, malaise/fatigue and weight loss.  HENT: Negative for congestion and nosebleeds.   Eyes: Negative for blurred vision, vision loss in left eye and vision loss in right eye.  Cardiovascular: Positive for leg swelling. Negative for chest pain, claudication, orthopnea, paroxysmal nocturnal dyspnea and syncope.  Respiratory: Negative for cough, shortness of breath and wheezing.   Endocrine: Negative for cold intolerance, polydipsia and polyuria.  Hematologic/Lymphatic: Does not bruise/bleed easily.  Skin: Negative for poor wound healing and rash.  Musculoskeletal: Positive for arthritis and joint pain.  Gastrointestinal: Negative for abdominal pain, change in bowel habit, heartburn, hematemesis and melena.  Genitourinary: Negative for bladder incontinence and non-menstrual bleeding.  Neurological: Positive for light-headedness, loss of balance, numbness and paresthesias. Negative for focal weakness, headaches and vertigo.  Psychiatric/Behavioral: Negative for altered mental status, depression and memory loss.   All other systems reviewed and are negative.   PHYSICAL EXAM:   VS:  BP 126/66 (BP Location: Left Arm, Patient Position: Sitting, Cuff Size: Normal)   Pulse (!) 51   Ht 5\' 5"  (1.651 m)   Wt 132 lb (59.9 kg)    BMI 21.97 kg/m      General: Alert, oriented x3, no distress, thin Head: no evidence of trauma, PERRL, EOMI, no exophtalmos or lid lag, no myxedema, no xanthelasma; normal ears, nose and oropharynx Neck: normal jugular venous pulsations and no hepatojugular reflux; brisk carotid pulses without delay and no carotid bruits Chest: clear to auscultation, no signs of consolidation by percussion or palpation, normal fremitus, symmetrical and full respiratory excursions Cardiovascular: normal position and quality of the apical impulse, irregular rhythm, normal first and second heart sounds, no murmurs, rubs or gallops Abdomen: no tenderness or distention, no masses by palpation, no abnormal pulsatility or arterial bruits, normal bowel sounds, no hepatosplenomegaly Extremities: no clubbing, cyanosis or edema; 2+ radial, ulnar and brachial pulses bilaterally; 2+ right femoral, posterior tibial and dorsalis pedis pulses; 2+ left femoral, posterior tibial and dorsalis pedis pulses; no subclavian or femoral bruits Neurological: grossly nonfocal Psych: Normal mood and affect    Wt Readings from Last 3 Encounters:  06/15/18 132 lb (59.9 kg)  03/20/18 134 lb 3.2 oz (60.9 kg)  03/19/18 134 lb (60.8 kg)      Studies/Labs Reviewed:   EKG:  EKG is ordered today.  ECG shows atrial fibrillation with slow Ventricular response, QTc 388 ms 10/12/2017: ALT 47 10/13/2017: TSH 0.437 10/17/2017: BUN 20; Creatinine, Ser 1.16; Hemoglobin 10.6; Platelets 149; Potassium 4.2; Sodium 135   Lipid Panel    Component Value Date/Time   CHOL 139 02/02/2014 1106   TRIG 47 02/02/2014 1106   HDL 54 02/02/2014 1106   CHOLHDL 2.6 02/02/2014 1106   VLDL 9 02/02/2014 1106   LDLCALC 76 02/02/2014 1106     ASSESSMENT:    1. Atrial fibrillation with slow ventricular response ([place])   2. Chronic diastolic heart failure ([place])   3. Essential hypertension   4. Hypercholesteremia   5. Recurrent stenosis of right carotid  artery   6. PAD (peripheral artery disease) ([place])   7. Diabetes mellitus type 2 in nonobese ([facility])   8. Fall, initial encounter   9. Near syncope  PLAN:  In order of problems listed above:  1. Afib: CHADSVasc score 9. On Eliquis.  This is her first fall in the last 12 months and is looking for anticoagulation benefit exceeds the risk.  She has slow ventricular response without any medications.  Although there was no clear association between her recent bradycardia, this is a concern.  She might need a pacemaker.  We will have her wear a 48-hour Holter monitor to look for significant bradycardia. 2. CHF: She is very sedentary and it is hard to assess her functional status, but she appears to be NYHA functional class I and euvolemic without need for any loop diuretics. 3. HTN: Her blood pressure is very well controlled on a combination of amlodipine and losartan.  She has had orthostatic hypotension in the past and this might be an alternative explanation for the fall 4. HLP: on statin.  Labs checked at [facility] by Dr. Philip Aspen. 5. Right carotid stenosis: No focal neurological complaints. 6. PAD: She has mostly infrapopliteal disease (bilateral two-vessel runoff, ABI 0.97 on right 0.90 on left, 2017).  She does not have any intermittent claudication (sedentary).. 7. DM: Reports good control on metformin monotherapy 8. Falls and early cognitive dysfunction: Only one fall in last 12 months.  She occasionally has her daughter help her with answering some questions.  I do not really detect any deterioration otherwise.    Medication Adjustments/Labs and Tests Ordered: Current medicines are reviewed at length with the patient today.  Concerns regarding medicines are outlined above.  Medication changes, Labs and Tests ordered today are listed in the Patient Instructions below. Patient Instructions  Medication Instructions: Dr Sallyanne Kuster has recommended making the following medication  changes: 1. DECREASE Losartan to 25 mg daily  Please use the following medications with caution and ONLY with supervision:  Hydrocodone-acetaminophen  Hydroxyzine  Promethazine   Labwork: NONE ORDERED  Testing/Procedures: 1. 48-hour Holter Monitor - Your physician has recommended that you wear a holter monitor. Holter monitors are medical devices that record the heart's electrical activity. Doctors most often use these monitors to diagnose arrhythmias. Arrhythmias are problems with the speed or rhythm of the heartbeat. The monitor is a small, portable device. You can wear one while you do your normal daily activities. This is usually used to diagnose what is causing palpitations/syncope (passing out). >>This will be placed at our [facility] location [place], [place] [address] [phone]  Follow-up: Dr Sallyanne Kuster recommends that you schedule a follow-up appointment in 6 months. You will receive a reminder letter in the mail two months in advance. If you don't receive a letter, please call our office to schedule the follow-up appointment.  If you need a refill on your cardiac medications before your next appointment, please call your pharmacy.    Signed, Sanda Klein, MD  06/16/2018 3:21 PM    [facility] [address] [place], [place], [place]  [address] Phone: [phone]; Fax: [phone]

## 2018-06-15 NOTE — H&P (View-Only) (Signed)
Patient ID: Donna Owens, female   DOB: 06-30-1928, 84 y.o.   MRN: [id number] Patient ID: Donna Owens, female   DOB: October 04, 1928, 84 y.o.   MRN: [id number]    Cardiology Office Note    Date:  06/16/2018   ID:  Donna Owens, DOB [birth year], MRN [id number]  PCP:  Leanna Battles, MD  Cardiologist:   Sanda Klein, MD   No chief complaint on file.   History of Present Illness:  Donna Owens is a 84 y.o. female with long term persistent atrial fibrillation with slow ventricular response, diastolic heart failure, moderate carotid stenosis (Dr. Donnetta Hutching), HTN, DM, hyperlipidemia, severe neuropathy with unsteady gait and frequent falls.  As almost always, she is accompanied by her daughter.  June 22 she had her first fall in the last year.  She was groggy and may be a little disoriented and weak that day.  Thankfully she did not have any serious injury or any bleeding. She did not have loss of consciousness.  Her medication list includes possible culprit agents such as Vicodin, lidocaine patch, eyedrops.  No clear relation to any of those age meds on that day  The patient specifically denies any chest pain at rest exertion, dyspnea at rest or with exertion, orthopnea, paroxysmal nocturnal dyspnea, syncope, palpitations, focal neurological deficits, intermittent claudication, lower extremity edema, unexplained weight gain, cough, hemoptysis or wheezing.  She presents today with atrial fibrillation with slow ventricular response at about 51 bpm.  She does not feel weak or dizzy today.  She is not on any medications with negative chronotropic effect (other than memantine).   Past Medical History:  Diagnosis Date  . Anemia   . Atrophic vaginitis   . Blood transfusion without reported diagnosis   . Breast cancer, right breast ([place]) 2001  . Carotid arterial disease ([place]) 12/13/11    mild by doppler  . CVA (cerebral vascular accident) ([place])   . Hyperlipemia   . Hypertension   . OSA  (obstructive sleep apnea)   . OSA on CPAP 06/20/2014  . Osteoporosis   . Pulmonary hypertension ([place])    moderate echo 11/07/10  . PVD (peripheral vascular disease) ([place])   . Sleep apnea   . Type II diabetes mellitus ([place])     Past Surgical History:  Procedure Laterality Date  . BREAST LUMPECTOMY Right 07/26/2000   chemo/radiation  . BREAST LUMPECTOMY Left    benign  . CARDIOVERSION N/A 07/21/2014   Procedure: CARDIOVERSION;  Surgeon: Sanda Klein, MD;  Location: [facility];  Service: Cardiovascular;  Laterality: N/A;  . COLONOSCOPY  2010  . TOENAIL EXCISION  03/2013   ingrown nail    Outpatient Medications Prior to Visit  Medication Sig Dispense Refill  . acetaminophen (TYLENOL) 500 MG tablet Take 500 mg by mouth every 6 (six) hours as needed for moderate pain.    Marland Kitchen amLODipine (NORVASC) 5 MG tablet TAKE 1 TABLET(5 MG) BY MOUTH DAILY 90 tablet 2  . apixaban (ELIQUIS) 2.5 MG TABS tablet Take 1 tablet (2.5 mg total) by mouth 2 (two) times daily. 180 tablet 2  . calcium-vitamin D (OSCAL WITH D) 500-200 MG-UNIT per tablet Take 1 tablet by mouth daily with breakfast.     . Cholecalciferol (VITAMIN D-3) 1000 UNITS CAPS Take 1 capsule by mouth daily.    . Coenzyme Q10 (CO Q 10) 100 MG CAPS Take 1 capsule by mouth daily.    . ferrous sulfate 325 (65 FE) MG tablet Take 1 tablet  by mouth daily.  12  . gabapentin (NEURONTIN) 300 MG capsule Take 1 capsule (300 mg total) by mouth at bedtime. (Patient taking differently: Take 300 mg by mouth. ) 90 capsule 11  . HYDROcodone-acetaminophen (NORCO/VICODIN) 5-325 MG tablet Take 1 tablet by mouth every 6 (six) hours as needed for severe pain. 30 tablet 0  . hydrOXYzine (ATARAX/VISTARIL) 25 MG tablet Take 1 tablet (25 mg total) by mouth 3 (three) times daily as needed for itching. 30 tablet 2  . lidocaine (LIDODERM) 5 % Place 1 patch onto the skin daily. Remove & Discard patch within 12 hours or as directed by MD 30 patch 0  . memantine (NAMENDA) 5  MG tablet Take 1 tab PO daily for 1 week then increase to 1 tab BID thereafter 180 tablet 3  . metFORMIN (GLUCOPHAGE) 500 MG tablet TAKE 1 TABLET BY MOUTH DAILY 30 tablet 9  . Polyethyl Glycol-Propyl Glycol (SYSTANE) 0.4-0.3 % SOLN Apply 1 drop to eye every 8 (eight) hours as needed (dry eyes).    . pravastatin (PRAVACHOL) 40 MG tablet Take 40 mg by mouth daily.    . promethazine (PHENERGAN) 25 MG tablet Take 25 mg by mouth daily. nausea    . triamcinolone cream (KENALOG) 0.1 % Apply 1 application topically 2 (two) times daily. 30 g 0  . trolamine salicylate (ASPERCREME) 10 % cream Apply 1 application topically as needed for muscle pain.    . vitamin B-12 (CYANOCOBALAMIN) 1000 MCG tablet Take 1,000 mcg by mouth 2 (two) times daily.     Marland Kitchen losartan (COZAAR) 50 MG tablet Take 1 tablet (50 mg total) by mouth daily. 90 tablet 3   No facility-administered medications prior to visit.      Allergies:   Patient has no known allergies.   Social History   Socioeconomic History  . Marital status: Divorced    Spouse name: Not on file  . Number of children: 1  . Years of education: BSN  . Highest education level: Not on file  Occupational History  . Occupation: RETIRED    Employer: RETIRED  Social Needs  . Financial resource strain: Not on file  . Food insecurity:    Worry: Not on file    Inability: Not on file  . Transportation needs:    Medical: Not on file    Non-medical: Not on file  Tobacco Use  . Smoking status: Never Smoker  . Smokeless tobacco: Never Used  Substance and Sexual Activity  . Alcohol use: No  . Drug use: No  . Sexual activity: Never    Birth control/protection: Post-menopausal  Lifestyle  . Physical activity:    Days per week: Not on file    Minutes per session: Not on file  . Stress: Not on file  Relationships  . Social connections:    Talks on phone: Not on file    Gets together: Not on file    Attends religious service: Not on file    Active member of  club or organization: Not on file    Attends meetings of clubs or organizations: Not on file    Relationship status: Not on file  Other Topics Concern  . Not on file  Social History Narrative   Patient is divorced and lives alone.   Patient has one adult daughter.   Patient is retired.   Patient has a BSN degree in nursing.   Patient is right-handed.   Patient drinks one soda per week.  Family History:  The patient's family history includes Diabetes in her brother and other; Heart failure (age of onset: 44) in her daughter; Hypertension in her brother.   ROS:   Please see the history of present illness.    Review of Systems  Constitution: Negative for chills, fever, malaise/fatigue and weight loss.  HENT: Negative for congestion and nosebleeds.   Eyes: Negative for blurred vision, vision loss in left eye and vision loss in right eye.  Cardiovascular: Positive for leg swelling. Negative for chest pain, claudication, orthopnea, paroxysmal nocturnal dyspnea and syncope.  Respiratory: Negative for cough, shortness of breath and wheezing.   Endocrine: Negative for cold intolerance, polydipsia and polyuria.  Hematologic/Lymphatic: Does not bruise/bleed easily.  Skin: Negative for poor wound healing and rash.  Musculoskeletal: Positive for arthritis and joint pain.  Gastrointestinal: Negative for abdominal pain, change in bowel habit, heartburn, hematemesis and melena.  Genitourinary: Negative for bladder incontinence and non-menstrual bleeding.  Neurological: Positive for light-headedness, loss of balance, numbness and paresthesias. Negative for focal weakness, headaches and vertigo.  Psychiatric/Behavioral: Negative for altered mental status, depression and memory loss.   All other systems reviewed and are negative.   PHYSICAL EXAM:   VS:  BP 126/66 (BP Location: Left Arm, Patient Position: Sitting, Cuff Size: Normal)   Pulse (!) 51   Ht 5\' 5"  (1.651 m)   Wt 132 lb (59.9 kg)    BMI 21.97 kg/m      General: Alert, oriented x3, no distress, thin Head: no evidence of trauma, PERRL, EOMI, no exophtalmos or lid lag, no myxedema, no xanthelasma; normal ears, nose and oropharynx Neck: normal jugular venous pulsations and no hepatojugular reflux; brisk carotid pulses without delay and no carotid bruits Chest: clear to auscultation, no signs of consolidation by percussion or palpation, normal fremitus, symmetrical and full respiratory excursions Cardiovascular: normal position and quality of the apical impulse, irregular rhythm, normal first and second heart sounds, no murmurs, rubs or gallops Abdomen: no tenderness or distention, no masses by palpation, no abnormal pulsatility or arterial bruits, normal bowel sounds, no hepatosplenomegaly Extremities: no clubbing, cyanosis or edema; 2+ radial, ulnar and brachial pulses bilaterally; 2+ right femoral, posterior tibial and dorsalis pedis pulses; 2+ left femoral, posterior tibial and dorsalis pedis pulses; no subclavian or femoral bruits Neurological: grossly nonfocal Psych: Normal mood and affect    Wt Readings from Last 3 Encounters:  06/15/18 132 lb (59.9 kg)  03/20/18 134 lb 3.2 oz (60.9 kg)  03/19/18 134 lb (60.8 kg)      Studies/Labs Reviewed:   EKG:  EKG is ordered today.  ECG shows atrial fibrillation with slow Ventricular response, QTc 388 ms 10/12/2017: ALT 47 10/13/2017: TSH 0.437 10/17/2017: BUN 20; Creatinine, Ser 1.16; Hemoglobin 10.6; Platelets 149; Potassium 4.2; Sodium 135   Lipid Panel    Component Value Date/Time   CHOL 139 02/02/2014 1106   TRIG 47 02/02/2014 1106   HDL 54 02/02/2014 1106   CHOLHDL 2.6 02/02/2014 1106   VLDL 9 02/02/2014 1106   LDLCALC 76 02/02/2014 1106     ASSESSMENT:    1. Atrial fibrillation with slow ventricular response ([facility])   2. Chronic diastolic heart failure ([place])   3. Essential hypertension   4. Hypercholesteremia   5. Recurrent stenosis of right carotid  artery   6. PAD (peripheral artery disease) ([place])   7. Diabetes mellitus type 2 in nonobese ([facility])   8. Fall, initial encounter   9. Near syncope  PLAN:  In order of problems listed above:  1. Afib: CHADSVasc score 9. On Eliquis.  This is her first fall in the last 12 months and is looking for anticoagulation benefit exceeds the risk.  She has slow ventricular response without any medications.  Although there was no clear association between her recent bradycardia, this is a concern.  She might need a pacemaker.  We will have her wear a 48-hour Holter monitor to look for significant bradycardia. 2. CHF: She is very sedentary and it is hard to assess her functional status, but she appears to be NYHA functional class I and euvolemic without need for any loop diuretics. 3. HTN: Her blood pressure is very well controlled on a combination of amlodipine and losartan.  She has had orthostatic hypotension in the past and this might be an alternative explanation for the fall 4. HLP: on statin.  Labs checked at [facility] by Dr. Philip Aspen. 5. Right carotid stenosis: No focal neurological complaints. 6. PAD: She has mostly infrapopliteal disease (bilateral two-vessel runoff, ABI 0.97 on right 0.90 on left, 2017).  She does not have any intermittent claudication (sedentary).. 7. DM: Reports good control on metformin monotherapy 8. Falls and early cognitive dysfunction: Only one fall in last 12 months.  She occasionally has her daughter help her with answering some questions.  I do not really detect any deterioration otherwise.    Medication Adjustments/Labs and Tests Ordered: Current medicines are reviewed at length with the patient today.  Concerns regarding medicines are outlined above.  Medication changes, Labs and Tests ordered today are listed in the Patient Instructions below. Patient Instructions  Medication Instructions: Dr Sallyanne Kuster has recommended making the following medication  changes: 1. DECREASE Losartan to 25 mg daily  Please use the following medications with caution and ONLY with supervision:  Hydrocodone-acetaminophen  Hydroxyzine  Promethazine   Labwork: NONE ORDERED  Testing/Procedures: 1. 48-hour Holter Monitor - Your physician has recommended that you wear a holter monitor. Holter monitors are medical devices that record the heart's electrical activity. Doctors most often use these monitors to diagnose arrhythmias. Arrhythmias are problems with the speed or rhythm of the heartbeat. The monitor is a small, portable device. You can wear one while you do your normal daily activities. This is usually used to diagnose what is causing palpitations/syncope (passing out). >>This will be placed at our [facility] location [place], [place] [address] [phone]  Follow-up: Dr Sallyanne Kuster recommends that you schedule a follow-up appointment in 6 months. You will receive a reminder letter in the mail two months in advance. If you don't receive a letter, please call our office to schedule the follow-up appointment.  If you need a refill on your cardiac medications before your next appointment, please call your pharmacy.    Signed, Sanda Klein, MD  06/16/2018 3:21 PM    [facility] [place], [place], [place]  [address] Phone: [phone]; Fax: [phone]

## 2018-06-16 ENCOUNTER — Encounter: Payer: Self-pay | Admitting: Cardiovascular Disease

## 2018-06-22 DIAGNOSIS — R350 Frequency of micturition: Secondary | ICD-10-CM | POA: Diagnosis not present

## 2018-06-22 DIAGNOSIS — R3915 Urgency of urination: Secondary | ICD-10-CM | POA: Diagnosis not present

## 2018-06-23 ENCOUNTER — Ambulatory Visit (INDEPENDENT_AMBULATORY_CARE_PROVIDER_SITE_OTHER): Payer: Medicare Other

## 2018-06-23 DIAGNOSIS — R55 Syncope and collapse: Secondary | ICD-10-CM

## 2018-06-23 DIAGNOSIS — I4891 Unspecified atrial fibrillation: Secondary | ICD-10-CM

## 2018-07-02 ENCOUNTER — Telehealth: Payer: Self-pay

## 2018-07-02 DIAGNOSIS — Z01812 Encounter for preprocedural laboratory examination: Secondary | ICD-10-CM

## 2018-07-02 DIAGNOSIS — R001 Bradycardia, unspecified: Secondary | ICD-10-CM

## 2018-07-02 DIAGNOSIS — I4891 Unspecified atrial fibrillation: Secondary | ICD-10-CM

## 2018-07-02 NOTE — Telephone Encounter (Signed)
Pacemaker implant scheduled for 07/08/18 at 3:30p. Patient's daughter aware. Advised her to come in on Monday at noon to review instructions and complete lab work. Daughter verbalized understanding and agreed with plan.  Letter drafted. Labs ordered.

## 2018-07-02 NOTE — Telephone Encounter (Signed)
-----   Message from Sanda Klein, MD sent at 06/30/2018  5:20 PM EDT ----- Discussed findings with her daughter. As we had previously planned at her office visit for such findings, she will do best with a pacemaker implantation. We reviewed the pros and cons. Tentatively scheduled for 07/08/2018.

## 2018-07-04 DIAGNOSIS — I10 Essential (primary) hypertension: Secondary | ICD-10-CM | POA: Diagnosis not present

## 2018-07-04 DIAGNOSIS — H5712 Ocular pain, left eye: Secondary | ICD-10-CM | POA: Diagnosis not present

## 2018-07-04 DIAGNOSIS — E119 Type 2 diabetes mellitus without complications: Secondary | ICD-10-CM | POA: Diagnosis not present

## 2018-07-06 DIAGNOSIS — I4891 Unspecified atrial fibrillation: Secondary | ICD-10-CM | POA: Diagnosis not present

## 2018-07-06 DIAGNOSIS — R001 Bradycardia, unspecified: Secondary | ICD-10-CM | POA: Diagnosis not present

## 2018-07-06 DIAGNOSIS — Z01812 Encounter for preprocedural laboratory examination: Secondary | ICD-10-CM | POA: Diagnosis not present

## 2018-07-06 LAB — BASIC METABOLIC PANEL
BUN/Creatinine Ratio: 19 (ref 12–28)
BUN: 28 mg/dL — AB (ref 8–27)
CALCIUM: 9.5 mg/dL (ref 8.7–10.3)
CO2: 22 mmol/L (ref 20–29)
CREATININE: 1.44 mg/dL — AB (ref 0.57–1.00)
Chloride: 104 mmol/L (ref 96–106)
GFR, EST AFRICAN AMERICAN: 37 mL/min/{1.73_m2} — AB (ref >59)
GFR, EST NON AFRICAN AMERICAN: 32 mL/min/{1.73_m2} — AB (ref >59)
Glucose: 141 mg/dL — ABNORMAL HIGH (ref 65–99)
Potassium: 4.5 mmol/L (ref 3.5–5.2)
Sodium: 141 mmol/L (ref 134–144)

## 2018-07-06 LAB — CBC
HEMATOCRIT: 37.3 % (ref 34.0–46.6)
HEMOGLOBIN: 12 g/dL (ref 11.1–15.9)
MCH: 29.9 pg (ref 26.6–33.0)
MCHC: 32.2 g/dL (ref 31.5–35.7)
MCV: 93 fL (ref 79–97)
Platelets: 177 10*3/uL (ref 150–450)
RBC: 4.02 x10E6/uL (ref 3.77–5.28)
RDW: 15.4 % (ref 12.3–15.4)
WBC: 7.2 10*3/uL (ref 3.4–10.8)

## 2018-07-08 ENCOUNTER — Ambulatory Visit (HOSPITAL_COMMUNITY)
Admission: RE | Admit: 2018-07-08 | Discharge: 2018-07-09 | Disposition: A | Discharge status: Home / Self Care | Payer: Medicare Other | Source: Ambulatory Visit | Attending: Cardiovascular Disease | Admitting: Cardiovascular Disease

## 2018-07-08 ENCOUNTER — Encounter (HOSPITAL_COMMUNITY)
Admission: RE | Disposition: A | Discharge status: Home / Self Care | Payer: Self-pay | Source: Ambulatory Visit | Attending: Cardiovascular Disease

## 2018-07-08 ENCOUNTER — Other Ambulatory Visit: Payer: Self-pay | Admitting: Cardiovascular Disease

## 2018-07-08 ENCOUNTER — Encounter (HOSPITAL_COMMUNITY): Payer: Self-pay | Admitting: General Practice

## 2018-07-08 ENCOUNTER — Other Ambulatory Visit: Payer: Self-pay

## 2018-07-08 DIAGNOSIS — Z8249 Family history of ischemic heart disease and other diseases of the circulatory system: Secondary | ICD-10-CM | POA: Insufficient documentation

## 2018-07-08 DIAGNOSIS — Z7901 Long term (current) use of anticoagulants: Secondary | ICD-10-CM | POA: Insufficient documentation

## 2018-07-08 DIAGNOSIS — Z9221 Personal history of antineoplastic chemotherapy: Secondary | ICD-10-CM | POA: Insufficient documentation

## 2018-07-08 DIAGNOSIS — I481 Persistent atrial fibrillation: Secondary | ICD-10-CM | POA: Insufficient documentation

## 2018-07-08 DIAGNOSIS — Z950 Presence of cardiac pacemaker: Secondary | ICD-10-CM | POA: Diagnosis present

## 2018-07-08 DIAGNOSIS — I951 Orthostatic hypotension: Secondary | ICD-10-CM | POA: Diagnosis not present

## 2018-07-08 DIAGNOSIS — G4733 Obstructive sleep apnea (adult) (pediatric): Secondary | ICD-10-CM | POA: Diagnosis not present

## 2018-07-08 DIAGNOSIS — I5032 Chronic diastolic (congestive) heart failure: Secondary | ICD-10-CM | POA: Insufficient documentation

## 2018-07-08 DIAGNOSIS — Z923 Personal history of irradiation: Secondary | ICD-10-CM | POA: Diagnosis not present

## 2018-07-08 DIAGNOSIS — Z79899 Other long term (current) drug therapy: Secondary | ICD-10-CM | POA: Diagnosis not present

## 2018-07-08 DIAGNOSIS — I4891 Unspecified atrial fibrillation: Secondary | ICD-10-CM

## 2018-07-08 DIAGNOSIS — E1151 Type 2 diabetes mellitus with diabetic peripheral angiopathy without gangrene: Secondary | ICD-10-CM | POA: Diagnosis not present

## 2018-07-08 DIAGNOSIS — Z9889 Other specified postprocedural states: Secondary | ICD-10-CM | POA: Insufficient documentation

## 2018-07-08 DIAGNOSIS — I6521 Occlusion and stenosis of right carotid artery: Secondary | ICD-10-CM | POA: Diagnosis not present

## 2018-07-08 DIAGNOSIS — Z7984 Long term (current) use of oral hypoglycemic drugs: Secondary | ICD-10-CM | POA: Insufficient documentation

## 2018-07-08 DIAGNOSIS — R001 Bradycardia, unspecified: Secondary | ICD-10-CM | POA: Diagnosis not present

## 2018-07-08 DIAGNOSIS — E1140 Type 2 diabetes mellitus with diabetic neuropathy, unspecified: Secondary | ICD-10-CM | POA: Diagnosis not present

## 2018-07-08 DIAGNOSIS — I272 Pulmonary hypertension, unspecified: Secondary | ICD-10-CM | POA: Insufficient documentation

## 2018-07-08 DIAGNOSIS — E7800 Pure hypercholesterolemia, unspecified: Secondary | ICD-10-CM | POA: Insufficient documentation

## 2018-07-08 DIAGNOSIS — Z853 Personal history of malignant neoplasm of breast: Secondary | ICD-10-CM | POA: Diagnosis not present

## 2018-07-08 DIAGNOSIS — I110 Hypertensive heart disease with heart failure: Secondary | ICD-10-CM | POA: Insufficient documentation

## 2018-07-08 DIAGNOSIS — Z833 Family history of diabetes mellitus: Secondary | ICD-10-CM | POA: Insufficient documentation

## 2018-07-08 HISTORY — DX: Migraine, unspecified, not intractable, without status migrainosus: G43.909

## 2018-07-08 HISTORY — DX: Major depressive disorder, single episode, unspecified: F32.9

## 2018-07-08 HISTORY — DX: Unspecified osteoarthritis, unspecified site: M19.90

## 2018-07-08 HISTORY — DX: Low back pain, unspecified: M54.50

## 2018-07-08 HISTORY — PX: INSERT / REPLACE / REMOVE PACEMAKER: SUR710

## 2018-07-08 HISTORY — DX: Presence of cardiac pacemaker: Z95.0

## 2018-07-08 HISTORY — DX: Anxiety disorder, unspecified: F41.9

## 2018-07-08 HISTORY — PX: PACEMAKER IMPLANT: EP1218

## 2018-07-08 HISTORY — DX: Low back pain: M54.5

## 2018-07-08 HISTORY — DX: Other chronic pain: G89.29

## 2018-07-08 HISTORY — DX: Depression, unspecified: F32.A

## 2018-07-08 LAB — GLUCOSE, CAPILLARY
GLUCOSE-CAPILLARY: 97 mg/dL (ref 70–99)
Glucose-Capillary: 92 mg/dL (ref 70–99)
Glucose-Capillary: 220 mg/dL — ABNORMAL HIGH (ref 70–99)

## 2018-07-08 LAB — SURGICAL PCR SCREEN
MRSA, PCR: NEGATIVE
STAPHYLOCOCCUS AUREUS: NEGATIVE

## 2018-07-08 SURGERY — PACEMAKER IMPLANT
Anesthesia: LOCAL

## 2018-07-08 MED ORDER — CEFAZOLIN SODIUM-DEXTROSE 1-4 GM/50ML-% IV SOLN
1.0000 g | Freq: Three times a day (TID) | INTRAVENOUS | Status: AC
  Administered 2018-07-08 – 2018-07-09 (×3): 1 g via INTRAVENOUS
  Filled 2018-07-08 (×3): qty 50

## 2018-07-08 MED ORDER — ACETAMINOPHEN 325 MG PO TABS: 325.0000 mg | ORAL_TABLET | ORAL | Status: DC | PRN

## 2018-07-08 MED ORDER — SODIUM CHLORIDE 0.9 % IV SOLN
INTRAVENOUS | Status: DC
  Administered 2018-07-08: 14:00:00 via INTRAVENOUS

## 2018-07-08 MED ORDER — HYDRALAZINE HCL 25 MG PO TABS
25.0000 mg | ORAL_TABLET | Freq: Three times a day (TID) | ORAL | Status: DC | PRN
  Administered 2018-07-08: 25 mg via ORAL
  Filled 2018-07-08: qty 1

## 2018-07-08 MED ORDER — AMLODIPINE BESYLATE 5 MG PO TABS
ORAL_TABLET | ORAL | Status: AC
  Filled 2018-07-08: qty 1

## 2018-07-08 MED ORDER — GABAPENTIN 100 MG PO CAPS
100.0000 mg | ORAL_CAPSULE | Freq: Every day | ORAL | Status: DC
Start: 2018-07-08 — End: 2018-07-09
  Administered 2018-07-08: 100 mg via ORAL
  Filled 2018-07-08: qty 1

## 2018-07-08 MED ORDER — HYDROCODONE-ACETAMINOPHEN 5-325 MG PO TABS
1.0000 | ORAL_TABLET | Freq: Four times a day (QID) | ORAL | Status: DC | PRN
  Administered 2018-07-08 – 2018-07-09 (×2): 1 via ORAL
  Filled 2018-07-08 (×2): qty 1

## 2018-07-08 MED ORDER — SODIUM CHLORIDE 0.9 % IV SOLN
INTRAVENOUS | Status: DC
  Administered 2018-07-08: 15:00:00 via INTRAVENOUS

## 2018-07-08 MED ORDER — CEFAZOLIN SODIUM-DEXTROSE 2-4 GM/100ML-% IV SOLN
2.0000 g | INTRAVENOUS | Status: AC
  Administered 2018-07-08: 2 g via INTRAVENOUS
  Filled 2018-07-08: qty 100

## 2018-07-08 MED ORDER — LOSARTAN POTASSIUM 50 MG PO TABS
25.0000 mg | ORAL_TABLET | Freq: Once | ORAL | Status: AC
  Administered 2018-07-08: 18:00:00 25 mg via ORAL
  Filled 2018-07-08 (×2): qty 1

## 2018-07-08 MED ORDER — FENTANYL CITRATE (PF) 100 MCG/2ML IJ SOLN
INTRAMUSCULAR | Status: AC
  Filled 2018-07-08: qty 2

## 2018-07-08 MED ORDER — LIDOCAINE HCL (PF) 1 % IJ SOLN
INTRAMUSCULAR | Status: DC | PRN
  Administered 2018-07-08: 45 mL

## 2018-07-08 MED ORDER — DIVALPROEX SODIUM 125 MG PO CSDR
125.0000 mg | DELAYED_RELEASE_CAPSULE | Freq: Every day | ORAL | Status: DC
  Administered 2018-07-08 – 2018-07-09 (×2): 125 mg via ORAL
  Filled 2018-07-08 (×2): qty 1

## 2018-07-08 MED ORDER — AMOXICILLIN 500 MG PO CAPS
1000.0000 mg | ORAL_CAPSULE | Freq: Two times a day (BID) | ORAL | Status: DC
Start: 2018-07-08 — End: 2018-07-09
  Administered 2018-07-08 – 2018-07-09 (×2): 1000 mg via ORAL
  Filled 2018-07-08 (×2): qty 2

## 2018-07-08 MED ORDER — HYDROXYZINE HCL 25 MG PO TABS: 25.0000 mg | ORAL_TABLET | Freq: Three times a day (TID) | ORAL | Status: DC | PRN

## 2018-07-08 MED ORDER — ONDANSETRON HCL 4 MG/2ML IJ SOLN: 4.0000 mg | Freq: Four times a day (QID) | INTRAMUSCULAR | Status: DC | PRN

## 2018-07-08 MED ORDER — LIDOCAINE HCL 1 % IJ SOLN
INTRAMUSCULAR | Status: AC
  Filled 2018-07-08: qty 60

## 2018-07-08 MED ORDER — IOPAMIDOL (ISOVUE-370) INJECTION 76%
INTRAVENOUS | Status: AC
  Filled 2018-07-08: qty 50

## 2018-07-08 MED ORDER — MUPIROCIN 2 % EX OINT
TOPICAL_OINTMENT | CUTANEOUS | Status: AC
Start: 2018-07-08 — End: 2018-07-08
  Administered 2018-07-08: 14:00:00
  Filled 2018-07-08: qty 22

## 2018-07-08 MED ORDER — IOPAMIDOL (ISOVUE-370) INJECTION 76%
INTRAVENOUS | Status: DC | PRN
  Administered 2018-07-08: 10 mL via INTRAVENOUS

## 2018-07-08 MED ORDER — MIDAZOLAM HCL 5 MG/5ML IJ SOLN
INTRAMUSCULAR | Status: AC
  Filled 2018-07-08: qty 5

## 2018-07-08 MED ORDER — SODIUM CHLORIDE 0.9 % IV SOLN
80.0000 mg | INTRAVENOUS | Status: AC
  Administered 2018-07-08: 80 mg
  Filled 2018-07-08: qty 2

## 2018-07-08 MED ORDER — HYPROMELLOSE (GONIOSCOPIC) 2.5 % OP SOLN
2.0000 [drp] | Freq: Three times a day (TID) | OPHTHALMIC | Status: DC | PRN
  Filled 2018-07-08: qty 15

## 2018-07-08 MED ORDER — FENTANYL CITRATE (PF) 100 MCG/2ML IJ SOLN
INTRAMUSCULAR | Status: DC | PRN
  Administered 2018-07-08: 25 ug via INTRAVENOUS

## 2018-07-08 MED ORDER — LOSARTAN POTASSIUM 50 MG PO TABS
25.0000 mg | ORAL_TABLET | Freq: Every day | ORAL | Status: DC
  Administered 2018-07-09: 12:00:00 25 mg via ORAL
  Filled 2018-07-08: qty 1

## 2018-07-08 MED ORDER — MIDAZOLAM HCL 5 MG/5ML IJ SOLN
INTRAMUSCULAR | Status: DC | PRN
  Administered 2018-07-08: 1 mg via INTRAVENOUS

## 2018-07-08 MED ORDER — ENSURE ENLIVE PO LIQD
237.0000 mL | Freq: Two times a day (BID) | ORAL | Status: DC
  Administered 2018-07-09: 12:00:00 237 mL via ORAL
  Filled 2018-07-08 (×2): qty 237

## 2018-07-08 MED ORDER — HEPARIN (PORCINE) IN NACL 1000-0.9 UT/500ML-% IV SOLN
INTRAVENOUS | Status: DC | PRN
  Administered 2018-07-08: 500 mL

## 2018-07-08 MED ORDER — MUPIROCIN 2 % EX OINT
1.0000 "application " | TOPICAL_OINTMENT | Freq: Once | CUTANEOUS | Status: DC
  Filled 2018-07-08: qty 22

## 2018-07-08 MED ORDER — CEFAZOLIN SODIUM-DEXTROSE 2-4 GM/100ML-% IV SOLN
INTRAVENOUS | Status: AC
  Filled 2018-07-08: qty 100

## 2018-07-08 MED ORDER — HEPARIN (PORCINE) IN NACL 1000-0.9 UT/500ML-% IV SOLN
INTRAVENOUS | Status: AC
  Filled 2018-07-08: qty 500

## 2018-07-08 MED ORDER — CHLORHEXIDINE GLUCONATE 4 % EX LIQD
60.0000 mL | Freq: Once | CUTANEOUS | Status: DC
  Filled 2018-07-08: qty 60

## 2018-07-08 MED ORDER — AMLODIPINE BESYLATE 5 MG PO TABS
5.0000 mg | ORAL_TABLET | Freq: Once | ORAL | Status: AC
  Administered 2018-07-08: 5 mg via ORAL
  Filled 2018-07-08: qty 1

## 2018-07-08 MED ORDER — ACETAMINOPHEN 325 MG PO TABS: 325.0000 mg | ORAL_TABLET | Freq: Four times a day (QID) | ORAL | Status: DC | PRN

## 2018-07-08 MED ORDER — AMLODIPINE BESYLATE 5 MG PO TABS
5.0000 mg | ORAL_TABLET | Freq: Every day | ORAL | Status: DC
  Administered 2018-07-09: 5 mg via ORAL
  Filled 2018-07-08: qty 1

## 2018-07-08 MED ORDER — YOU HAVE A PACEMAKER BOOK
Freq: Once | Status: AC
  Administered 2018-07-08: 22:00:00 1
  Filled 2018-07-08: qty 1

## 2018-07-08 MED ORDER — PRAVASTATIN SODIUM 40 MG PO TABS
40.0000 mg | ORAL_TABLET | Freq: Every day | ORAL | Status: DC
  Administered 2018-07-08 – 2018-07-09 (×2): 40 mg via ORAL
  Filled 2018-07-08 (×2): qty 1

## 2018-07-08 MED ORDER — SODIUM CHLORIDE 0.9 % IV SOLN
INTRAVENOUS | Status: AC
  Filled 2018-07-08: qty 2

## 2018-07-08 MED ORDER — SODIUM CHLORIDE 0.9 % IV SOLN
INTRAVENOUS | Status: DC
  Administered 2018-07-08: 22:00:00 via INTRAVENOUS

## 2018-07-08 SURGICAL SUPPLY — 5 items
CABLE SURGICAL S-101-97-12 (CABLE) ×2 IMPLANT
LEAD TENDRIL MRI 58CM LPA1200M (Lead) ×1 IMPLANT
PACEMAKER ASSURITY SR-SF (Pacemaker) ×1 IMPLANT
SHEATH CLASSIC 8F (SHEATH) ×1 IMPLANT
TRAY PACEMAKER INSERTION (PACKS) ×2 IMPLANT

## 2018-07-08 NOTE — Progress Notes (Signed)
Spoke with Genene Churn about where to place IV. Pt has had surgery for right breast cancer and was told not to have Bp or sticks to that arms. Anderson Malta states to place both left arm

## 2018-07-08 NOTE — Interval H&P Note (Signed)
History and Physical Interval Note:  07/08/2018 12:46 PM  Donna Owens  has presented today for surgery, with the diagnosis of bradycardia  The various methods of treatment have been discussed with the patient and family. After consideration of risks, benefits and other options for treatment, the patient has consented to  Procedure(s): PACEMAKER IMPLANT (N/A) as a surgical intervention .  The patient's history has been reviewed, patient examined, no change in status, stable for surgery.  I have reviewed the patient's chart and labs.  Questions were answered to the patient's satisfaction.     Donna Owens

## 2018-07-08 NOTE — Progress Notes (Signed)
Dr Sallyanne Kuster called and informed of BP new orders noted

## 2018-07-08 NOTE — Op Note (Signed)
Procedure report  Procedure performed: 1. Implantation of new single chamber permanent pacemaker 2. Fluoroscopy 3. Left upper extremity venography 4. Sedation Reason for procedure: Symptomatic bradycardia due to atrial fibrillation with slow ventricular response Procedure performed by: Sanda Klein, MD Complications: None Estimated blood loss: <10 mL Medications administered during procedure: Ancef 1 g intravenously Lidocaine 1% 30 mL locally During this procedure the patient is administered a total of Versed 1 mg and Fentanyl 25 mcg to achieve and maintain moderate conscious sedation.  The patient's heart rate, blood pressure, and oxygen saturation are monitored continuously during the procedure. The period of conscious sedation is 35 minutes, of which I was present face-to-face 100% of this time.  Device details: Risk manager. Jude Assurity MRI model 2703 serial number [id number] Right ventricular lead St. Jude LPA1200M-58 cm serial number [id number]  Procedure details:  After the risks and benefits of the procedure were discussed the patient provided informed consent and was brought to the cardiac cath lab in the fasting state. The patient was prepped and draped in usual sterile fashion. Local anesthesia with 1% lidocaine was administered to to the left infraclavicular area. A 5-6 cm horizontal incision was made parallel with and 2-3 cm caudal to the left clavicle. Using electrocautery and blunt dissection a prepectoral pocket was created down to the level of the pectoralis major muscle fascia. The pocket was carefully inspected for hemostasis. An antibiotic-soaked sponge was placed in the pocket.  Under fluoroscopic guidance and using the modified Seldinger technique a single venipuncture was were performed to access the left subclavian vein. Some difficulty was encountered accessing the vein and a venogram was performed.  A J-tip guidewire was subsequently exchanged for a 27F safe  sheaths.  Under fluoroscopic guidance the ventricular lead was advanced to level of the mid right ventricular septum and the active-fixation helix was deployed. Prominent current of injury was seen. Satisfactory pacing and sensing parameters were recorded. There was no evidence of diaphragmatic stimulation at maximum device output. The safe sheath was peeled away and the lead was secured in place with 2-0 silk.  The antibiotic-soaked sponge was removed from the pocket. The pocket was flushed with copious amounts of antibiotic solution. Reinspection showed excellent hemostasis..  The ventricular lead was connected to the generator and appropriate ventricular pacing was seen. Repeat testing of the lead parameters via telemetryshowed excellent values.  The entire system was then carefully inserted in the pocket with care been taking that the leads and device assumed a comfortable position without pressure on the incision. Great care was taken that the lead be located deep to the generator. The pocket was then closed in layers using 2 layers of 2-0 Vicryl and cutaneous staples, after which a sterile dressing was applied.  At the end of the procedure the following lead parameters were encountered: sensed R waves 13.2 mV, impedance 720ohms, threshold 0.3 V at 0.5 ms pulse width.  Sanda Klein, MD, [facility] [phone] office [phone] pager

## 2018-07-09 ENCOUNTER — Ambulatory Visit (HOSPITAL_COMMUNITY): Payer: Medicare Other

## 2018-07-09 ENCOUNTER — Encounter (HOSPITAL_COMMUNITY): Payer: Self-pay | Admitting: Cardiovascular Disease

## 2018-07-09 DIAGNOSIS — I5032 Chronic diastolic (congestive) heart failure: Secondary | ICD-10-CM | POA: Diagnosis not present

## 2018-07-09 DIAGNOSIS — E7800 Pure hypercholesterolemia, unspecified: Secondary | ICD-10-CM | POA: Diagnosis not present

## 2018-07-09 DIAGNOSIS — Z7984 Long term (current) use of oral hypoglycemic drugs: Secondary | ICD-10-CM | POA: Diagnosis not present

## 2018-07-09 DIAGNOSIS — E1151 Type 2 diabetes mellitus with diabetic peripheral angiopathy without gangrene: Secondary | ICD-10-CM | POA: Diagnosis not present

## 2018-07-09 DIAGNOSIS — I951 Orthostatic hypotension: Secondary | ICD-10-CM | POA: Diagnosis not present

## 2018-07-09 DIAGNOSIS — I6521 Occlusion and stenosis of right carotid artery: Secondary | ICD-10-CM | POA: Diagnosis not present

## 2018-07-09 DIAGNOSIS — Z7901 Long term (current) use of anticoagulants: Secondary | ICD-10-CM | POA: Diagnosis not present

## 2018-07-09 DIAGNOSIS — G4733 Obstructive sleep apnea (adult) (pediatric): Secondary | ICD-10-CM | POA: Diagnosis not present

## 2018-07-09 DIAGNOSIS — E1140 Type 2 diabetes mellitus with diabetic neuropathy, unspecified: Secondary | ICD-10-CM | POA: Diagnosis not present

## 2018-07-09 DIAGNOSIS — Z950 Presence of cardiac pacemaker: Secondary | ICD-10-CM | POA: Diagnosis not present

## 2018-07-09 DIAGNOSIS — I272 Pulmonary hypertension, unspecified: Secondary | ICD-10-CM | POA: Diagnosis not present

## 2018-07-09 DIAGNOSIS — I110 Hypertensive heart disease with heart failure: Secondary | ICD-10-CM | POA: Diagnosis not present

## 2018-07-09 DIAGNOSIS — I481 Persistent atrial fibrillation: Secondary | ICD-10-CM | POA: Diagnosis not present

## 2018-07-09 LAB — GLUCOSE, CAPILLARY
GLUCOSE-CAPILLARY: 106 mg/dL — AB (ref 70–99)
Glucose-Capillary: 222 mg/dL — ABNORMAL HIGH (ref 70–99)

## 2018-07-09 MED ORDER — HYDROCODONE-ACETAMINOPHEN 5-325 MG PO TABS: 1.0000 | ORAL_TABLET | Freq: Four times a day (QID) | ORAL | 0 refills | Status: DC | PRN

## 2018-07-09 MED ORDER — HYDROCODONE-ACETAMINOPHEN 5-325 MG PO TABS: 4.0000 | ORAL_TABLET | Freq: Four times a day (QID) | ORAL | 0 refills | Status: DC | PRN

## 2018-07-09 NOTE — Discharge Instructions (Signed)
Supplemental Discharge Instructions for  Pacemaker/Defibrillator Patients   ACTIVITY  Do not raise your left/right arm above shoulder level or extend it backward beyond shoulder level for 2 weeks. Wear the arm sling as a reminder or as needed for comfort for 2 weeks. No heavy lifting or vigorous activity with your left/right arm for 6-8 weeks.   NO DRIVING is preferable for 2 weeks; If absolutely necessary, drive only short, familiar routes. DO wear your seatbelt, even if it crosses over the pacemaker site.   WOUND CARE  Keep the wound area clean and dry. Remove the dressing the day after you return home (usually 48 hours after the procedure).  DO NOT SUBMERGE UNDER WATER UNTIL FULLY HEALED (no tub baths, hot tubs, swimming pools, etc.).  You may shower or take a sponge bath after the dressing is removed. DO NOT SOAK the area and do not allow the shower to directly spray on the site.  If you have staples, these will be removed in the office in 7-14 days.  If you have tape/steri-strips on your wound, these will fall off; do not pull them off prematurely.  No bandage is needed on the site. DO NOT apply any creams, oils, or ointments to the wound area.  If you notice any drainage or discharge from the wound, any swelling, excessive redness or bruising at the site, or if you develop a fever > 101? F after you are discharged home, call the office at once.  SPECIAL INSTRUCTIONS  You are still able to use cellular telephones. Avoid carrying your cellular phone near your device.  When traveling through airports, show security personnel your identification card to avoid being screened in the metal detectors.  Avoid arc welding equipment, MRI testing (magnetic resonance imaging), TENS units (transcutaneous nerve stimulators). Call the office for questions about other devices.  Avoid electrical appliances that are in poor condition or are not properly grounded.  Microwave ovens are safe to be near or to  operate.   Additional information for defibrillator patients should your device go off: - If your device goes off ONCE and you feel fine afterward, notify the device clinic nurses. - If your device goes off ONCE and you do not feel well afterward, call 911. - If your device goes off TWICE, call 911. - If your device goes off THREE times in one day, call 911.  DO NOT DRIVE YOURSELF OR A FAMILY MEMBER WITH A DEFIBRILLATOR TO THE HOSPITAL--CALL 911.   Supplemental Discharge Instructions for  Pacemaker/Defibrillator Patients  Activity Pacemaker Implantation, Adult, Care After This sheet gives you information about how to care for yourself after your procedure. Your health care provider may also give you more specific instructions. If you have problems or questions, contact your health care provider. What can I expect after the procedure? After the procedure, it is common to have:  Mild pain.  Slight bruising.  Some swelling over the incision.  A slight bump over the skin where the device was placed. Sometimes, it is possible to feel the device under the skin. This is normal.  Follow these instructions at home: Medicines  Take over-the-counter and prescription medicines only as told by your health care provider.  If you were prescribed an antibiotic medicine, take it as told by your health care provider. Do not stop taking the antibiotic even if you start to feel better. Wound care  Do not remove the bandage on your chest until directed to do so by your health  care provider.  After your bandage is removed, you may see pieces of tape called skin adhesive strips over the area where the cut was made (incision site). Let them fall off on their own.  Check the incision site every day to make sure it is not infected, bleeding, or starting to pull apart.  Do not use lotions or ointments near the incision site unless directed to do so.  Keep the incision area clean and dry for 2-3  days after the procedure or as directed by your health care provider. It takes several weeks for the incision site to completely heal.  Do not take baths, swim, or use a hot tub for 7-10 days or as otherwise directed by your health care provider. Activity  Do not drive or use heavy machinery while taking prescription pain medicine.  Do not drive for 24 hours if you were given a medicine to help you relax (sedative).  Check with your health care provider before you start to drive or play sports.  Avoid sudden jerking, pulling, or chopping movements that pull your upper arm far away from your body. Avoid these movements for at least 6 weeks or as long as told by your health care provider.  Do not lift your upper arm above your shoulders for at least 6 weeks or as long as told by your health care provider. This means no tennis, golf, or swimming.  You may go back to work when your health care provider says it is okay. Pacemaker care  You may be shown how to transfer data from your pacemaker through the phone to your health care provider.  Always let all health care providers know about your pacemaker before you have any medical procedures or tests.  Wear a medical ID bracelet or necklace stating that you have a pacemaker. Carry a pacemaker ID card with you at all times.  Your pacemaker battery will last for 5-15 years. Routine checks by your health care provider will let the health care provider know when the battery is starting to run down. The pacemaker will need to be replaced when the battery starts to run down.  Do not use amateur Chief of Staff. Other electrical devices are safe to use, including power tools, lawn mowers, and speakers. If you are unsure of whether something is safe to use, ask your health care provider.  When using your cell phone, hold it to the ear opposite the pacemaker. Do not leave your cell phone in a pocket over the  pacemaker.  Avoid places or objects that have a strong electric or magnetic field, including: ? Airport Herbalist. When at the airport, let officials know that you have a pacemaker. ? Power plants. ? Large electrical generators. ? Radiofrequency transmission towers, such as cell phone and radio towers. General instructions  Weigh yourself every day. If you suddenly gain weight, fluid may be building up in your body.  Keep all follow-up visits as told by your health care provider. This is important. Contact a health care provider if:  You gain weight suddenly.  Your legs or feet swell.  It feels like your heart is fluttering or skipping beats (heart palpitations).  You have chills or a fever.  You have more redness, swelling, or pain around your incisions.  You have more fluid or blood coming from your incisions.  Your incisions feel warm to the touch.  You have pus or a bad smell coming from your incisions. Get  help right away if:  You have chest pain.  You have trouble breathing or are short of breath.  You become extremely tired.  You are light-headed or you faint. This information is not intended to replace advice given to you by your health care provider. Make sure you discuss any questions you have with your health care provider. Document Released: 06/28/2005 Document Revised: 09/20/2016 Document Reviewed: 09/20/2016 Elsevier Interactive Patient Education  2018 [place] not raise your left/right arm above shoulder level or extend it backward beyond shoulder level for 2 weeks. Wear the arm sling as a reminder or as needed for comfort for 2 weeks. No heavy lifting or vigorous activity with your left/right arm for 6-8 weeks.    NO DRIVING is preferable for 2 weeks; If absolutely necessary, drive only short, familiar routes. DO wear your seatbelt, even if it crosses over the pacemaker site.  WOUND CARE - Keep the wound area clean and dry.  Remove the  dressing the day after you return home (usually 48 hours after the procedure). - DO NOT SUBMERGE UNDER WATER UNTIL FULLY HEALED (no tub baths, hot tubs, swimming pools, etc.).  - You  may shower or take a sponge bath after the dressing is removed. DO NOT SOAK the area and do not allow the shower to directly spray on the site. - If you have staples, these will be removed in the office in 7-14 days. - If you have tape/steri-strips on your wound, these will fall off; do not pull them off prematurely.   - No bandage is needed on the site.  DO  NOT apply any creams, oils, or ointments to the wound area. - If you notice any drainage or discharge from the wound, any swelling, excessive redness or bruising at the site, or if you develop a fever > 101? F after you are discharged home, call the office at once.  Special Instructions - You are still able to use cellular telephones.  Avoid carrying your cellular phone near your device. - When traveling through airports, show security personnel your identification card to avoid being screened in the metal detectors.  - Avoid arc welding equipment, MRI testing (magnetic resonance imaging), TENS units (transcutaneous nerve stimulators).  Call the office for questions about other devices. - Avoid electrical appliances that are in poor condition or are not properly grounded. - Microwave ovens are safe to be near or to operate.  .hcp

## 2018-07-09 NOTE — Discharge Summary (Signed)
Discharge Summary    Patient ID: Donna Owens,  MRN: [id number], DOB/AGE: 07/31/1928 84 y.o.  Admit date: 07/08/2018 Discharge date: 07/09/2018  Primary Care Provider: Leanna Battles Primary Cardiologist: Sanda Klein, MD  Discharge Diagnoses    Principal Problem:   Atrial fibrillation with slow ventricular response [facility]) Active Problems:   Symptomatic bradycardia   Pacemaker   Allergies No Known Allergies  Diagnostic Studies/Procedures    PACEMAKER IMPLANT 07/08/2018 Conclusion  1. Implantation of new single chamber permanent pacemaker 2. Fluoroscopy 3. Left upper extremity venography 4. Sedation Reason for procedure: Symptomatic bradycardia due to atrial fibrillation with slow ventricular response Procedure performed by: Sanda Klein, MD Complications: None   Device details: Generator St. Jude Assurity MRI model 1272 serial number [id number] Right ventricular lead St. Jude [id number] cm serial number [id number] _____________  Pacemaker interrogation 07/09/2018 This AM device check 87% V paced overnight. R waves >12 mV Impedance 600 ohm Threshold 0.37V@0 .7ms    History of Present Illness     Donna Owens is a 84 y.o. female  with a long history of persistent atrial fibrillation with slow ventricular response, diastolic heart failure, moderate carotid stenosis (Dr Early), hypertension, diabetes, hyperlipidemia, severe neuropathy with unsteady gait and frequent falls.  The patient was having frequent falls and her medication list included possible culprit agents such as Vicodin, lidocaine patch, eyedrops, whereby there was no clear relationship to these medications.  She complained of weakness and dizziness with a heart rate in the low 50s and on no negative chronotropic medications (other than memantine).   A 48-hour Holter monitor revealed A. fib with slow ventricular response and symptomatic pauses greater than 3 seconds as well as occasional  wide-complex beats that may represent a ventricular escape rhythm.  In light of her symptoms she was arranged for placement of permanent pacemaker.  Hospital Course     Consultants: none  Ms. Older underwent placement of a single-chamber St Jude Assurity MRI model permanent pacemaker on 5/68/1275 without complications.  She was observed overnight and did well.  This morning her pacemaker interrogation showed 87% intraocular pacing overnight.  The device is functioning normally.  Chest x-ray showed no pneumothorax.  The patient is very sore at her pacemaker site.  She is requesting Vicodin for home as Tylenol did not relieve her pain while she was in the hospital and Vicodin has helped.  I am reluctant with her history of falls.  I have agreed to give her a few doses just to get her through the first couple of days and then she can use extra strength Tylenol as needed.  Her daughter will be with her.  She has minimal oozing at the surgical site.  She will be given wound care and activity instructions.  She will have a wound check in 10-14 days in our office visit with Dr. Sallyanne Kuster in 3 months.  Patient has been seen by Dr. Sallyanne Kuster today and deemed ready for discharge home. All follow up appointments have been scheduled. Discharge medications are listed below.  _____________  Discharge Vitals Blood pressure (!) 155/39, pulse 60, temperature 98.4 F (36.9 C), temperature source Oral, resp. rate 19, height 5\' 5"  (1.651 m), weight 127 lb 13.9 oz (58 kg), SpO2 100 %.  Filed Weights   07/08/18 1332 07/09/18 0726  Weight: 130 lb (59 kg) 127 lb 13.9 oz (58 kg)    Labs & Radiologic Studies    CBC No results for input(s): WBC, NEUTROABS, HGB, HCT,  MCV, PLT in the last 72 hours. Basic Metabolic Panel No results for input(s): NA, K, CL, CO2, GLUCOSE, BUN, CREATININE, CALCIUM, MG, PHOS in the last 72 hours. Liver Function Tests No results for input(s): AST, ALT, ALKPHOS, BILITOT, PROT, ALBUMIN in  the last 72 hours. No results for input(s): LIPASE, AMYLASE in the last 72 hours. Cardiac Enzymes No results for input(s): CKTOTAL, CKMB, CKMBINDEX, TROPONINI in the last 72 hours. BNP Invalid input(s): POCBNP D-Dimer No results for input(s): DDIMER in the last 72 hours. Hemoglobin A1C No results for input(s): HGBA1C in the last 72 hours. Fasting Lipid Panel No results for input(s): CHOL, HDL, LDLCALC, TRIG, CHOLHDL, LDLDIRECT in the last 72 hours. Thyroid Function Tests No results for input(s): TSH, T4TOTAL, T3FREE, THYROIDAB in the last 72 hours.  Invalid input(s): FREET3 _____________  No results found. Disposition   Pt is being discharged home today in good condition.  Follow-up Plans & Appointments    Follow-up Information    Croitoru, Mihai, MD Follow up.   Specialty:  Cardiology Why:  Follow up of pacemaker on October 23rd at 10:40. Please arrive 15 minutes early for check-in. Contact information: [address] [place] [address] [phone]        [facility] Follow up.   Specialty:  Cardiology Why:  Pacemaker site check on July 31 at 11:00 at the [employer] office. Contact information: [address] [place] [phone]         Discharge Instructions    Diet - low sodium heart healthy   Complete by:  As directed    Increase activity slowly   Complete by:  As directed       Discharge Medications   Allergies as of 07/09/2018   No Known Allergies     Medication List    TAKE these medications   acetaminophen 325 MG tablet Commonly known as:  TYLENOL Take 325 mg by mouth every 6 (six) hours as needed for moderate pain or headache.   amLODipine 5 MG tablet Commonly known as:  NORVASC TAKE 1 TABLET(5 MG) BY MOUTH DAILY   amoxicillin 500 MG tablet Commonly known as:  AMOXIL Take 1,000 mg by mouth 2 (two) times daily.   apixaban 2.5 MG Tabs tablet Commonly  known as:  ELIQUIS Take 1 tablet (2.5 mg total) by mouth 2 (two) times daily.   ASPERCREME LIDOCAINE 4 % Liqd Generic drug:  Lidocaine HCl Apply 1 application topically daily.   CALCIUM-VITAMIN D3 PO Take 1 tablet by mouth daily.   Co Q 10 100 MG Caps Take 100 mg by mouth daily.   divalproex 125 MG capsule Commonly known as:  DEPAKOTE SPRINKLE Take 125 mg by mouth daily.   ferrous sulfate 325 (65 FE) MG tablet Take 325 mg by mouth daily.   furosemide 20 MG tablet Commonly known as:  LASIX Take 20 mg by mouth every Monday, Wednesday, and Friday.   gabapentin 300 MG capsule Commonly known as:  NEURONTIN Take 1 capsule (300 mg total) by mouth at bedtime. What changed:  how much to take   HYDROcodone-acetaminophen 5-325 MG tablet Commonly known as:  NORCO/VICODIN Take 4 tablets by mouth every 6 (six) hours as needed for severe pain. What changed:  how much to take   hydrOXYzine 25 MG tablet Commonly known as:  ATARAX/VISTARIL Take 1 tablet (25 mg total) by mouth 3 (three) times daily as needed for itching.   lidocaine  5 % Commonly known as:  LIDODERM Place 1 patch onto the skin daily. Remove & Discard patch within 12 hours or as directed by MD   losartan 25 MG tablet Commonly known as:  COZAAR Take 1 tablet (25 mg total) by mouth daily.   memantine 5 MG tablet Commonly known as:  NAMENDA Take 1 tab PO daily for 1 week then increase to 1 tab BID thereafter   metFORMIN 500 MG tablet Commonly known as:  GLUCOPHAGE TAKE 1 TABLET BY MOUTH DAILY   omega-3 acid ethyl esters 1 g capsule Commonly known as:  LOVAZA Take 1 g by mouth daily.   pravastatin 40 MG tablet Commonly known as:  PRAVACHOL Take 40 mg by mouth daily.   SYSTANE 0.4-0.3 % Soln Generic drug:  Polyethyl Glycol-Propyl Glycol Place 2 drops into both eyes every 8 (eight) hours as needed (for dry eyes).   triamcinolone cream 0.1 % Commonly known as:  KENALOG Apply 1 application topically 2 (two)  times daily.   Vitamin B-12 2500 MCG Subl Place 2,500 mcg under the tongue daily.   Vitamin D-3 5000 units Tabs Take 5,000 Units by mouth daily.        Acute coronary syndrome (MI, NSTEMI, STEMI, etc) this admission?: No.    Outstanding Labs/Studies   None  Duration of Discharge Encounter   Greater than 30 minutes including physician time.  Signed, Daune Perch, NP 07/09/2018, 9:09 AM

## 2018-07-09 NOTE — Progress Notes (Addendum)
   Progress Note  Patient Name: Donna Owens Date of Encounter: 07/09/2018  Primary Cardiologist: No primary care provider on file.   Subjective   A little sore at site. Minimal oozing.  Inpatient Medications    Scheduled Meds: . amLODipine  5 mg Oral Daily  . amoxicillin  1,000 mg Oral BID  . divalproex  125 mg Oral Daily  . feeding supplement (ENSURE ENLIVE)  237 mL Oral BID BM  . gabapentin  100 mg Oral QHS  . losartan  25 mg Oral Daily  . pravastatin  40 mg Oral Daily   Continuous Infusions: . sodium chloride 10 mL/hr at 07/08/18 2141  .  ceFAZolin (ANCEF) IV 1 g (07/09/18 0609)   PRN Meds: acetaminophen, hydrALAZINE, HYDROcodone-acetaminophen, hydroxypropyl methylcellulose / hypromellose, ondansetron (ZOFRAN) IV   Vital Signs    Vitals:   07/08/18 1900 07/08/18 2000 07/08/18 2008 07/09/18 0726  BP: (!) 197/95  (!) 164/73 (!) 155/39  Pulse:   60 60  Resp: 15 14 19 19   Temp:    98.4 F (36.9 C)  TempSrc:   Oral Oral  SpO2: 100% 100% 100% 100%  Weight:    127 lb 13.9 oz (58 kg)  Height:        Intake/Output Summary (Last 24 hours) at 07/09/2018 0750 Last data filed at 07/09/2018 0737 Gross per 24 hour  Intake -  Output 1050 ml  Net -1050 ml   Filed Weights   07/08/18 1332 07/09/18 0726  Weight: 130 lb (59 kg) 127 lb 13.9 oz (58 kg)    Telemetry    AFib, V paced, occasional conducted native QRS - Personally Reviewed  ECG    AFib, mostly V paced. Paced QRS fairly narrow, with +ve R wave in V1 with relatively high septal lead location - Personally Reviewed  Physical Exam  Appears well. Healthy surgical site without hematoma. GEN: No acute distress.   Neck: No JVD Cardiac: RRR, split S2, no murmurs, rubs, or gallops.  Respiratory: Clear to auscultation bilaterally. GI: Soft, nontender, non-distended  MS: No edema; No deformity. Neuro:  Nonfocal  Psych: Normal affect   Labs    Chemistry Recent Labs  Lab 07/06/18 0000  NA 141  K 4.5  CL  104  CO2 22  GLUCOSE 141*  BUN 28*  CREATININE 1.44*  CALCIUM 9.5  GFRNONAA 32*  GFRAA 37*     Hematology Recent Labs  Lab 07/06/18 0000  WBC 7.2  RBC 4.02  HGB 12.0  HCT 37.3  MCV 93  MCH 29.9  MCHC 32.2  RDW 15.4  PLT 177    Radiology    No results found.  CXR - no pneumothorax on my review Cardiac Studies  This AM device check 87% V paced overnight. R waves >12 mV Impedance 600 ohm Threshold 0.37V@0 .77ms  Patient Profile     85 y.o. female with symptomatic bradycardia due to ,afi w slow ventricular response s/p uncomplicated single chamber PM.  Assessment & Plan    DC home, wound check 10-14 days, office visit 3 months. Discussed activity restrictions and wound care.  For questions or updates, please contact [place] Please consult [email or web address] for contact info under Cardiology/STEMI.      Signed, Sanda Klein, MD  07/09/2018, 7:50 AM

## 2018-07-13 ENCOUNTER — Telehealth: Payer: Self-pay

## 2018-07-13 NOTE — Telephone Encounter (Signed)
**Note De-Identified  Obfuscation** We received a PA request for Hydrocodone/Acetaminophen 5-325mg  with instructions to take 1 tablet q 6 hrs as needed for up to 4 doses for severe pain.  The RX was written for 30 tablets. Please clarify.  The pts insurance only pays for 7 day suppy without a PA.  Does the pt need more than a 7 day supply? Please advise.

## 2018-07-13 NOTE — Telephone Encounter (Signed)
**Note De-Identified  Obfuscation** After discussing with Tana Coast, Pharm D and due to Daune Perch, NP's note in the pts d/c summary on 07/09/18 I called [facility] and advised the pharmacist that 7 days worth of Hydrocodone/Acetaminophen is more than the pt will require and to only fill for a 7 day supply which will be 2 less tablets than RX was written for. The pt will receive 28 tabs with no refills.

## 2018-07-22 ENCOUNTER — Ambulatory Visit (INDEPENDENT_AMBULATORY_CARE_PROVIDER_SITE_OTHER): Payer: Medicare Other | Admitting: *Deleted

## 2018-07-22 DIAGNOSIS — Z950 Presence of cardiac pacemaker: Secondary | ICD-10-CM | POA: Diagnosis not present

## 2018-07-22 DIAGNOSIS — I4891 Unspecified atrial fibrillation: Secondary | ICD-10-CM

## 2018-07-22 LAB — CUP PACEART INCLINIC DEVICE CHECK
Battery Remaining Longevity: 136 mo
Date Time Interrogation Session: 20190731114155
Implantable Lead Implant Date: 20190717
Implantable Pulse Generator Implant Date: 20190717
Lead Channel Pacing Threshold Amplitude: 0.5 V
Lead Channel Pacing Threshold Pulse Width: 0.5 ms
Lead Channel Setting Pacing Amplitude: 0.75 V
MDC IDC LEAD LOCATION: 753860
MDC IDC MSMT BATTERY VOLTAGE: 3.07 V
MDC IDC MSMT LEADCHNL RV IMPEDANCE VALUE: 575 Ohm
MDC IDC MSMT LEADCHNL RV SENSING INTR AMPL: 12 mV
MDC IDC PG SERIAL: 9002800
MDC IDC SET LEADCHNL RV PACING PULSEWIDTH: 0.5 ms
MDC IDC SET LEADCHNL RV SENSING SENSITIVITY: 2 mV
MDC IDC STAT BRADY RV PERCENT PACED: 76 %
Pulse Gen Model: 1272

## 2018-07-22 NOTE — Progress Notes (Addendum)
Wound check appointment. Staples removed. Wound without redness or edema. Incision edges approximated, wound well healed. Normal device function. Threshold, sensing, and impedance consistent with implant measurements. Auto capture programmed on for extra safety margin until 3 month visit. Histogram distribution appropriate for patient and level of activity. Persistent AF, +Eliquis. No high ventricular rates noted. Patient educated about wound care, arm mobility, lifting restrictions, and Merlin monitor. ROV with [facility] [place] on 10/14/18.  Patient reports a productive cough x1 week. Reports her PCP is aware and has prescribed Tessalon pearls. She feels well otherwise, no fever/chills or ShOB (except during a coughing fit). Encouraged patient to f/u with PCP for further recommendations. Patient and daughter are agreeable and deny questions or concerns at this time.

## 2018-07-29 DIAGNOSIS — M2041 Other hammer toe(s) (acquired), right foot: Secondary | ICD-10-CM | POA: Diagnosis not present

## 2018-07-29 DIAGNOSIS — M89372 Hypertrophy of bone, left ankle and foot: Secondary | ICD-10-CM | POA: Diagnosis not present

## 2018-07-29 DIAGNOSIS — L600 Ingrowing nail: Secondary | ICD-10-CM | POA: Diagnosis not present

## 2018-07-29 DIAGNOSIS — M2042 Other hammer toe(s) (acquired), left foot: Secondary | ICD-10-CM | POA: Diagnosis not present

## 2018-07-30 DIAGNOSIS — E1151 Type 2 diabetes mellitus with diabetic peripheral angiopathy without gangrene: Secondary | ICD-10-CM | POA: Diagnosis not present

## 2018-07-30 DIAGNOSIS — N183 Chronic kidney disease, stage 3 (moderate): Secondary | ICD-10-CM | POA: Diagnosis not present

## 2018-07-30 DIAGNOSIS — R05 Cough: Secondary | ICD-10-CM | POA: Diagnosis not present

## 2018-07-30 DIAGNOSIS — I10 Essential (primary) hypertension: Secondary | ICD-10-CM | POA: Diagnosis not present

## 2018-07-30 DIAGNOSIS — Z6820 Body mass index (BMI) 20.0-20.9, adult: Secondary | ICD-10-CM | POA: Diagnosis not present

## 2018-08-11 ENCOUNTER — Ambulatory Visit: Payer: Medicare Other | Admitting: Cardiovascular Disease

## 2018-09-16 ENCOUNTER — Encounter (INDEPENDENT_AMBULATORY_CARE_PROVIDER_SITE_OTHER): Payer: Medicare Other | Admitting: Ophthalmology

## 2018-09-16 DIAGNOSIS — H35033 Hypertensive retinopathy, bilateral: Secondary | ICD-10-CM | POA: Diagnosis not present

## 2018-09-16 DIAGNOSIS — E11319 Type 2 diabetes mellitus with unspecified diabetic retinopathy without macular edema: Secondary | ICD-10-CM

## 2018-09-16 DIAGNOSIS — H43813 Vitreous degeneration, bilateral: Secondary | ICD-10-CM | POA: Diagnosis not present

## 2018-09-16 DIAGNOSIS — E113393 Type 2 diabetes mellitus with moderate nonproliferative diabetic retinopathy without macular edema, bilateral: Secondary | ICD-10-CM | POA: Diagnosis not present

## 2018-09-16 DIAGNOSIS — I10 Essential (primary) hypertension: Secondary | ICD-10-CM

## 2018-09-22 ENCOUNTER — Ambulatory Visit: Payer: Medicare Other | Admitting: Cardiovascular Disease

## 2018-09-22 DIAGNOSIS — Z23 Encounter for immunization: Secondary | ICD-10-CM | POA: Diagnosis not present

## 2018-09-23 ENCOUNTER — Encounter: Payer: Self-pay | Admitting: Adult Health

## 2018-09-23 ENCOUNTER — Ambulatory Visit (INDEPENDENT_AMBULATORY_CARE_PROVIDER_SITE_OTHER): Payer: Medicare Other | Admitting: Adult Health

## 2018-09-23 VITALS — BP 147/78 | HR 60 | Ht 65.0 in | Wt 129.8 lb

## 2018-09-23 DIAGNOSIS — R413 Other amnesia: Secondary | ICD-10-CM | POA: Diagnosis not present

## 2018-09-23 DIAGNOSIS — I6521 Occlusion and stenosis of right carotid artery: Secondary | ICD-10-CM | POA: Diagnosis not present

## 2018-09-23 NOTE — Progress Notes (Signed)
I have read the note, and I agree with the clinical assessment and plan.  Donna Owens   

## 2018-09-23 NOTE — Patient Instructions (Signed)
Your Plan:  Continue [place] Memory score is stable Take Melatonin 1-3 mg 2 hours before bedtime If your symptoms worsen or you develop new symptoms please let us know.   Thank you for coming to see [place] at [facility]. I hope we have been able to provide you high quality care today.  You may receive a patient satisfaction survey over the next few weeks. We would appreciate your feedback and comments so that we may continue to improve ourselves and the health of our patients.

## 2018-09-23 NOTE — Progress Notes (Signed)
PATIENT: Donna Owens DOB: 08-27-1928  REASON FOR VISIT: follow up HISTORY FROM: patient  HISTORY OF PRESENT ILLNESS: Today 09/23/18:  Donna Owens is an 84 year old female with a history of memory disturbance.  She returns today for follow-up.  She is currently lives at home alone.  She is on Namenda 5 mg twice a day.  She feels that her memory has remained the same.  She is able to complete all ADLs independently.  She does do some simple cooking.  Daughter notes that she had burn food but this is not consistent.  She manages her own finances.  She manages her own medications.  Her daughter helps her with appointment.  She does note that she does not sleep very well.  She states most nights it takes her a while to fall asleep.  She returns today for an evaluation.  HISTORY  03/19/18 Donna Owens is an 84 year old female with a history of memory disturbance.  She returns today for follow-up.  She states that since last visit she had a fall in October and went to the hospital.  She was discharged [facility] and is now living in [place] with her daughter.  He reports that they plan to move to her home in [place].  Reports that her daughter will be living with her.  She is able to complete all ADLs independently.  She does not operate a motor vehicle.  She reports that her sleep is fragmented.  Reports that she was given a prescription of gabapentin but she does not take it consistently.  She reports a good appetite.  She manages her own finances.  She now uses a cane when ambulating.  Denies any falls since October.  She is currently not on any memory medication.  She has noticed that her memory is worse since her fall.  She returns today for evaluation.   REVIEW OF SYSTEMS: Out of a complete 14 system review of symptoms, the patient complains only of the following symptoms, and all other reviewed systems are negative.  See HPI  ALLERGIES: No Known Allergies  HOME  MEDICATIONS: Outpatient Medications Prior to Visit  Medication Sig Dispense Refill  . acetaminophen (TYLENOL) 325 MG tablet Take 325 mg by mouth every 6 (six) hours as needed for moderate pain or headache.     Marland Kitchen amLODipine (NORVASC) 5 MG tablet TAKE 1 TABLET(5 MG) BY MOUTH DAILY 90 tablet 2  . apixaban (ELIQUIS) 2.5 MG TABS tablet Take 1 tablet (2.5 mg total) by mouth 2 (two) times daily. 180 tablet 2  . benzonatate (TESSALON) 100 MG capsule Take 1 capsule by mouth 3 (three) times daily as needed for cough.  0  . Calcium Carbonate-Vitamin D (CALCIUM-VITAMIN D3 PO) Take 1 tablet by mouth daily.    . Cholecalciferol (VITAMIN D-3) 5000 units TABS Take 5,000 Units by mouth daily.     . Coenzyme Q10 (CO Q 10) 100 MG CAPS Take 100 mg by mouth daily.     . Cyanocobalamin (VITAMIN B-12) 2500 MCG SUBL Place 2,500 mcg under the tongue daily.    . divalproex (DEPAKOTE SPRINKLE) 125 MG capsule Take 125 mg by mouth daily.    . ferrous sulfate 325 (65 FE) MG tablet Take 325 mg by mouth daily.   12  . furosemide (LASIX) 20 MG tablet Take 20 mg by mouth every Monday, Wednesday, and Friday.    . gabapentin (NEURONTIN) 300 MG capsule Take 1 capsule (300 mg total) by mouth at  bedtime. (Patient taking differently: Take 100 mg by mouth at bedtime. ) 90 capsule 11  . HYDROcodone-acetaminophen (NORCO/VICODIN) 5-325 MG tablet Take 1 tablet by mouth every 6 (six) hours as needed for up to 4 doses for severe pain. 30 tablet 0  . hydrOXYzine (ATARAX/VISTARIL) 25 MG tablet Take 1 tablet (25 mg total) by mouth 3 (three) times daily as needed for itching. 30 tablet 2  . lidocaine (LIDODERM) 5 % Place 1 patch onto the skin daily. Remove & Discard patch within 12 hours or as directed by MD 30 patch 0  . Lidocaine HCl (ASPERCREME LIDOCAINE) 4 % LIQD Apply 1 application topically daily.    Marland Kitchen losartan (COZAAR) 25 MG tablet Take 1 tablet (25 mg total) by mouth daily. 90 tablet 3  . memantine (NAMENDA) 5 MG tablet Take 1 tab PO  daily for 1 week then increase to 1 tab BID thereafter 180 tablet 3  . metFORMIN (GLUCOPHAGE) 500 MG tablet TAKE 1 TABLET BY MOUTH DAILY 30 tablet 9  . omega-3 acid ethyl esters (LOVAZA) 1 g capsule Take 1 g by mouth daily.    Vladimir Faster Glycol-Propyl Glycol (SYSTANE) 0.4-0.3 % SOLN Place 2 drops into both eyes every 8 (eight) hours as needed (for dry eyes).     . pravastatin (PRAVACHOL) 40 MG tablet Take 40 mg by mouth daily.     No facility-administered medications prior to visit.     PAST MEDICAL HISTORY: Past Medical History:  Diagnosis Date  . Anemia   . Anxiety   . Arthritis    "back and right knee" (07/08/2018)  . Atrophic vaginitis   . Breast cancer, right breast ([place]) 2001  . Carotid arterial disease ([place]) 12/13/11    mild by doppler  . Chronic lower back pain   . CVA (cerebral vascular accident) [facility])    "dr never told me I had one" (07/08/2018)  . Depression   . Hyperlipemia   . Hypertension   . Migraine    "1-2/year" (07/08/2018)  . OSA (obstructive sleep apnea)   . OSA on CPAP 06/20/2014  . Osteoporosis   . Presence of permanent cardiac pacemaker 07/08/2018  . Pulmonary hypertension ([place])    moderate echo 11/07/10  . PVD (peripheral vascular disease) ([place])   . Sleep apnea   . Type II diabetes mellitus ([place])     PAST SURGICAL HISTORY: Past Surgical History:  Procedure Laterality Date  . BREAST LUMPECTOMY Right 07/26/2000   chemo/radiation  . BREAST LUMPECTOMY Left    benign  . CARDIOVERSION N/A 07/21/2014   Procedure: CARDIOVERSION;  Surgeon: Sanda Klein, MD;  Location: [facility];  Service: Cardiovascular;  Laterality: N/A;  . CATARACT EXTRACTION W/ INTRAOCULAR LENS  IMPLANT, BILATERAL Bilateral    "not sure about left eye; ?lens"  . COLONOSCOPY  2010  . INSERT / REPLACE / REMOVE PACEMAKER  07/08/2018  . PACEMAKER IMPLANT N/A 07/08/2018   Procedure: PACEMAKER IMPLANT;  Surgeon: Sanda Klein, MD;  Location: [place] CV LAB;  Service:  Cardiovascular;  Laterality: N/A;  . TOENAIL EXCISION  03/2013   ingrown nail    FAMILY HISTORY: Family History  Problem Relation Age of Onset  . Diabetes Brother   . Hypertension Brother   . Heart failure Daughter 40  . Diabetes Other     SOCIAL HISTORY: Social History   Socioeconomic History  . Marital status: Divorced    Spouse name: Not on file  . Number of children: 1  . Years of  education: BSN  . Highest education level: Not on file  Occupational History  . Occupation: RETIRED    Employer: RETIRED  Social Needs  . Financial resource strain: Not on file  . Food insecurity:    Worry: Not on file    Inability: Not on file  . Transportation needs:    Medical: Not on file    Non-medical: Not on file  Tobacco Use  . Smoking status: Never Smoker  . Smokeless tobacco: Never Used  Substance and Sexual Activity  . Alcohol use: Never    Frequency: Never  . Drug use: Never  . Sexual activity: Not on file  Lifestyle  . Physical activity:    Days per week: Not on file    Minutes per session: Not on file  . Stress: Not on file  Relationships  . Social connections:    Talks on phone: Not on file    Gets together: Not on file    Attends religious service: Not on file    Active member of club or organization: Not on file    Attends meetings of clubs or organizations: Not on file    Relationship status: Not on file  . Intimate partner violence:    Fear of current or ex partner: Not on file    Emotionally abused: Not on file    Physically abused: Not on file    Forced sexual activity: Not on file  Other Topics Concern  . Not on file  Social History Narrative   Patient is divorced and lives alone.   Patient has one adult daughter.   Patient is retired.   Patient has a BSN degree in nursing.   Patient is right-handed.   Patient drinks one soda per week.      PHYSICAL EXAM  Vitals:   09/23/18 1116  BP: (!) 147/78  Pulse: 60  Weight: 129 lb 12.8 oz (58.9  kg)  Height: 5\' 5"  (1.651 m)   Body mass index is 21.6 kg/m.   MMSE - Mini Mental State Exam 09/23/2018 03/19/2018 09/15/2017  Not completed: (No Data) - -  Orientation to time 4 4 4   Orientation to Place 4 2 4   Registration 3 3 3   Attention/ Calculation 0 0 4  Attention/Calculation-comments - - couldn do numbers  Recall 2 1 1   Language- name 2 objects 2 2 2   Language- repeat 1 1 1   Language- follow 3 step command 3 1 3   Language- read & follow direction 1 1 1   Write a sentence 1 1 1   Copy design 0 0 0  Total score 21 16 24      Generalized: Well developed, in no acute distress   Neurological examination  Mentation: Alert oriented to time, place, history taking. Follows all commands speech and language fluent Cranial nerve II-XII:  Extraocular movements were full, visual field were full on confrontational test. Facial sensation and strength were normal. Uvula tongue midline. Head turning and shoulder shrug  were normal and symmetric. Motor: The motor testing reveals 5 over 5 strength of all 4 extremities. Good symmetric motor tone is noted throughout.  Sensory: Sensory testing is intact to soft touch on all 4 extremities. No evidence of extinction is noted.  Coordination: Cerebellar testing reveals good finger-nose-finger and heel-to-shin bilaterally.  Gait and station: Gait is unsteady.  Tandem gait not attempted.  She uses a cane when ambulating. Reflexes: Deep tendon reflexes are symmetric and normal bilaterally.   DIAGNOSTIC DATA (LABS, IMAGING, TESTING) -  I reviewed patient records, labs, notes, testing and imaging myself where available.  Lab Results  Component Value Date   WBC 7.2 07/06/2018   HGB 12.0 07/06/2018   HCT 37.3 07/06/2018   MCV 93 07/06/2018   PLT 177 07/06/2018      Component Value Date/Time   NA 141 07/06/2018 0000   K 4.5 07/06/2018 0000   CL 104 07/06/2018 0000   CO2 22 07/06/2018 0000   GLUCOSE 141 (H) 07/06/2018 0000   GLUCOSE 119 (H)  10/17/2017 0631   BUN 28 (H) 07/06/2018 0000   CREATININE 1.44 (H) 07/06/2018 0000   CREATININE 1.23 (H) 10/05/2015 1151   CALCIUM 9.5 07/06/2018 0000   PROT 8.5 (H) 10/12/2017 1446   PROT 6.7 06/21/2015 1358   ALBUMIN 3.7 10/12/2017 1446   ALBUMIN 4.1 06/21/2015 1358   AST 117 (H) 10/12/2017 1446   ALT 47 10/12/2017 1446   ALKPHOS 101 10/12/2017 1446   BILITOT 1.4 (H) 10/12/2017 1446   BILITOT 0.3 06/21/2015 1358   GFRNONAA 32 (L) 07/06/2018 0000   GFRNONAA 45 (L) 06/29/2014 1607   GFRAA 37 (L) 07/06/2018 0000   GFRAA 52 (L) 06/29/2014 1607   Lab Results  Component Value Date   CHOL 139 02/02/2014   HDL 54 02/02/2014   LDLCALC 76 02/02/2014   TRIG 47 02/02/2014   CHOLHDL 2.6 02/02/2014   Lab Results  Component Value Date   HGBA1C 5.4 02/02/2014   Lab Results  Component Value Date   VITAMINB12 3,005 (H) 10/13/2017   Lab Results  Component Value Date   TSH 0.437 10/13/2017      ASSESSMENT AND PLAN 84 y.o. year old female  has a past medical history of Anemia, Anxiety, Arthritis, Atrophic vaginitis, Breast cancer, right breast ([place]) (2001), Carotid arterial disease ([place]) (12/13/11), Chronic lower back pain, CVA (cerebral vascular accident) ([place]), Depression, Hyperlipemia, Hypertension, Migraine, OSA (obstructive sleep apnea), OSA on CPAP (06/20/2014), Osteoporosis, Presence of permanent cardiac pacemaker (07/08/2018), Pulmonary hypertension ([place]), PVD (peripheral vascular disease) ([place]), Sleep apnea, and Type II diabetes mellitus ([place]). here with :  1.  Memory disturbance  The patient's memory score has remained stable.  She will continue on Namenda 5 mg twice a day.  She is currently living at home alone.  I advised the family that this needs to be monitored closely.  Advised that if her symptoms worsen or she develops new symptoms she should let us know.  She will follow-up in 6 months or sooner if needed.  I spent 15 minutes with the patient. 50% of this time was  spent reviewing her memory score   Ward Givens, MSN, NP-C 09/23/2018, 11:21 AM [facility] [address], [place], [place] [address] [phone]

## 2018-10-06 DIAGNOSIS — M25561 Pain in right knee: Secondary | ICD-10-CM | POA: Diagnosis not present

## 2018-10-06 DIAGNOSIS — Z6820 Body mass index (BMI) 20.0-20.9, adult: Secondary | ICD-10-CM | POA: Diagnosis not present

## 2018-10-06 DIAGNOSIS — I10 Essential (primary) hypertension: Secondary | ICD-10-CM | POA: Diagnosis not present

## 2018-10-06 DIAGNOSIS — R2689 Other abnormalities of gait and mobility: Secondary | ICD-10-CM | POA: Diagnosis not present

## 2018-10-06 DIAGNOSIS — Z1389 Encounter for screening for other disorder: Secondary | ICD-10-CM | POA: Diagnosis not present

## 2018-10-06 DIAGNOSIS — M5137 Other intervertebral disc degeneration, lumbosacral region: Secondary | ICD-10-CM | POA: Diagnosis not present

## 2018-10-06 DIAGNOSIS — G4733 Obstructive sleep apnea (adult) (pediatric): Secondary | ICD-10-CM | POA: Diagnosis not present

## 2018-10-06 DIAGNOSIS — F5104 Psychophysiologic insomnia: Secondary | ICD-10-CM | POA: Diagnosis not present

## 2018-10-06 DIAGNOSIS — E46 Unspecified protein-calorie malnutrition: Secondary | ICD-10-CM | POA: Diagnosis not present

## 2018-10-06 DIAGNOSIS — E1151 Type 2 diabetes mellitus with diabetic peripheral angiopathy without gangrene: Secondary | ICD-10-CM | POA: Diagnosis not present

## 2018-10-07 ENCOUNTER — Other Ambulatory Visit: Payer: Self-pay | Admitting: Internal Medicine

## 2018-10-07 DIAGNOSIS — M25561 Pain in right knee: Secondary | ICD-10-CM

## 2018-10-09 DIAGNOSIS — E113293 Type 2 diabetes mellitus with mild nonproliferative diabetic retinopathy without macular edema, bilateral: Secondary | ICD-10-CM | POA: Diagnosis not present

## 2018-10-09 DIAGNOSIS — H16143 Punctate keratitis, bilateral: Secondary | ICD-10-CM | POA: Diagnosis not present

## 2018-10-09 DIAGNOSIS — H3562 Retinal hemorrhage, left eye: Secondary | ICD-10-CM | POA: Diagnosis not present

## 2018-10-14 ENCOUNTER — Encounter: Payer: Medicare Other | Admitting: Cardiovascular Disease

## 2018-10-28 ENCOUNTER — Encounter: Payer: Self-pay | Admitting: Cardiovascular Disease

## 2018-10-28 ENCOUNTER — Ambulatory Visit (INDEPENDENT_AMBULATORY_CARE_PROVIDER_SITE_OTHER): Payer: Medicare Other | Admitting: Cardiovascular Disease

## 2018-10-28 VITALS — BP 146/66 | HR 66 | Ht 65.0 in | Wt 131.0 lb

## 2018-10-28 DIAGNOSIS — I10 Essential (primary) hypertension: Secondary | ICD-10-CM

## 2018-10-28 DIAGNOSIS — E119 Type 2 diabetes mellitus without complications: Secondary | ICD-10-CM | POA: Diagnosis not present

## 2018-10-28 DIAGNOSIS — R001 Bradycardia, unspecified: Secondary | ICD-10-CM

## 2018-10-28 DIAGNOSIS — F09 Unspecified mental disorder due to known physiological condition: Secondary | ICD-10-CM

## 2018-10-28 DIAGNOSIS — Z950 Presence of cardiac pacemaker: Secondary | ICD-10-CM | POA: Diagnosis not present

## 2018-10-28 DIAGNOSIS — I5032 Chronic diastolic (congestive) heart failure: Secondary | ICD-10-CM

## 2018-10-28 DIAGNOSIS — I6521 Occlusion and stenosis of right carotid artery: Secondary | ICD-10-CM

## 2018-10-28 DIAGNOSIS — I4891 Unspecified atrial fibrillation: Secondary | ICD-10-CM | POA: Diagnosis not present

## 2018-10-28 DIAGNOSIS — I739 Peripheral vascular disease, unspecified: Secondary | ICD-10-CM | POA: Diagnosis not present

## 2018-10-28 DIAGNOSIS — E7800 Pure hypercholesterolemia, unspecified: Secondary | ICD-10-CM | POA: Diagnosis not present

## 2018-10-28 NOTE — Patient Instructions (Signed)
Medication Instructions:  Dr Sallyanne Kuster recommends that you continue on your current medications as directed. Please refer to the Current Medication list given to you today.  If you need a refill on your cardiac medications before your next appointment, please call your pharmacy.   Testing/Procedures: Remote monitoring is used to monitor your Pacemaker of ICD from home. This monitoring reduces the number of office visits required to check your device to one time per year. It allows [place] to keep an eye on the functioning of your device to ensure it is working properly. You are scheduled for a device check from home on Wednesday, February 5th, 2020. You may send your transmission at any time that day. If you have a wireless device, the transmission will be sent automatically. After your physician reviews your transmission, you will receive a postcard with your next transmission date.  Follow-Up: At [facility], you and your health needs are our priority.  As part of our continuing mission to provide you with exceptional heart care, we have created designated Provider Care Teams.  These Care Teams include your primary Cardiologist (physician) and Advanced Practice Providers (APPs -  Physician Assistants and Nurse Practitioners) who all work together to provide you with the care you need, when you need it.  You will need a follow up appointment in 6 months. Please call our office 2 months in advance to schedule this appointment. You may see Sanda Klein, MD or one of the following Advanced Practice Providers on your designated Care Team: [place], [place] . Fabian Sharp, PA-C

## 2018-10-30 DIAGNOSIS — E7800 Pure hypercholesterolemia, unspecified: Secondary | ICD-10-CM | POA: Insufficient documentation

## 2018-10-30 NOTE — Progress Notes (Signed)
Patient ID: Donna Owens, female   DOB: 09-29-1928, 84 y.o.   MRN: [id number] Patient ID: Donna Owens, female   DOB: Nov 09, 1928, 83 y.o.   MRN: [id number]    Cardiology Office Note    Date:  10/30/2018   ID:  Donna Owens, DOB 17-Mar-1928, MRN [id number]  PCP:  Leanna Battles, MD  Cardiologist:   Sanda Klein, MD   Chief Complaint  Patient presents with  . Follow-up    Pacemaker  . Atrial Fibrillation    History of Present Illness:  Donna Owens is a 84 y.o. female with long term persistent atrial fibrillation with slow ventricular response, single-chamber permanent pacemaker (July 2019, [employer]), chronic diastolic heart failure, moderate carotid stenosis (Dr. Donnetta Hutching), HTN, DM, hyperlipidemia, severe neuropathy with unsteady gait and frequent falls.  As almost always, she is accompanied by her daughter.  This is a first office follow-up since pacemaker implantation in July.  She feels she has more energy since the pacemaker in the morning.  The device still bothers her a little bit when she turns over, but for the most part she ignores its presence she has not had any falls or bleeding problems.  She denies dyspnea on exertion, orthopnea or PND but is fairly sedentary.  She has not had angina pectoris or palpitations.   The device site is well-healed.  Device function is normal, the lead parameters are excellent.  Estimated generator longevity is 11-12 years.  She has 91% ventricular pacing.  The underlying rhythm is atrial fibrillation with a ventricular rate of 38 bpm.  She has not had any high ventricular rates   Past Medical History:  Diagnosis Date  . Anemia   . Anxiety   . Arthritis    "back and right knee" (07/08/2018)  . Atrophic vaginitis   . Breast cancer, right breast ([place]) 2001  . Carotid arterial disease ([place]) 12/13/11    mild by doppler  . Chronic lower back pain   . CVA (cerebral vascular accident) [facility])    "dr never told me I had one" (07/08/2018)    . Depression   . Hyperlipemia   . Hypertension   . Migraine    "1-2/year" (07/08/2018)  . OSA (obstructive sleep apnea)   . OSA on CPAP 06/20/2014  . Osteoporosis   . Presence of permanent cardiac pacemaker 07/08/2018  . Pulmonary hypertension ([place])    moderate echo 11/07/10  . PVD (peripheral vascular disease) ([place])   . Sleep apnea   . Type II diabetes mellitus ([place])     Past Surgical History:  Procedure Laterality Date  . BREAST LUMPECTOMY Right 07/26/2000   chemo/radiation  . BREAST LUMPECTOMY Left    benign  . CARDIOVERSION N/A 07/21/2014   Procedure: CARDIOVERSION;  Surgeon: Sanda Klein, MD;  Location: [facility];  Service: Cardiovascular;  Laterality: N/A;  . CATARACT EXTRACTION W/ INTRAOCULAR LENS  IMPLANT, BILATERAL Bilateral    "not sure about left eye; ?lens"  . COLONOSCOPY  2010  . INSERT / REPLACE / REMOVE PACEMAKER  07/08/2018  . PACEMAKER IMPLANT N/A 07/08/2018   Procedure: PACEMAKER IMPLANT;  Surgeon: Sanda Klein, MD;  Location: [facility];  Service: Cardiovascular;  Laterality: N/A;  . TOENAIL EXCISION  03/2013   ingrown nail    Outpatient Medications Prior to Visit  Medication Sig Dispense Refill  . acetaminophen (TYLENOL) 325 MG tablet Take 325 mg by mouth every 6 (six) hours as needed for moderate pain or headache.     Marland Kitchen  amLODipine (NORVASC) 5 MG tablet TAKE 1 TABLET(5 MG) BY MOUTH DAILY 90 tablet 2  . apixaban (ELIQUIS) 2.5 MG TABS tablet Take 1 tablet (2.5 mg total) by mouth 2 (two) times daily. 180 tablet 2  . benzonatate (TESSALON) 100 MG capsule Take 1 capsule by mouth 3 (three) times daily as needed for cough.  0  . Calcium Carbonate-Vitamin D (CALCIUM-VITAMIN D3 PO) Take 1 tablet by mouth daily.    . Cholecalciferol (VITAMIN D-3) 5000 units TABS Take 5,000 Units by mouth daily.     . Coenzyme Q10 (CO Q 10) 100 MG CAPS Take 100 mg by mouth daily.     . Cyanocobalamin (VITAMIN B-12) 2500 MCG SUBL Place 2,500 mcg under the tongue daily.     . divalproex (DEPAKOTE SPRINKLE) 125 MG capsule Take 125 mg by mouth daily.    . ferrous sulfate 325 (65 FE) MG tablet Take 325 mg by mouth daily.   12  . furosemide (LASIX) 20 MG tablet Take 20 mg by mouth every Monday, Wednesday, and Friday.    . gabapentin (NEURONTIN) 300 MG capsule Take 1 capsule (300 mg total) by mouth at bedtime. (Patient taking differently: Take 100 mg by mouth at bedtime. ) 90 capsule 11  . HYDROcodone-acetaminophen (NORCO/VICODIN) 5-325 MG tablet Take 1 tablet by mouth every 6 (six) hours as needed for up to 4 doses for severe pain. 30 tablet 0  . hydrOXYzine (ATARAX/VISTARIL) 25 MG tablet Take 1 tablet (25 mg total) by mouth 3 (three) times daily as needed for itching. 30 tablet 2  . lidocaine (LIDODERM) 5 % Place 1 patch onto the skin daily. Remove & Discard patch within 12 hours or as directed by MD 30 patch 0  . Lidocaine HCl (ASPERCREME LIDOCAINE) 4 % LIQD Apply 1 application topically daily.    Marland Kitchen losartan (COZAAR) 25 MG tablet Take 1 tablet (25 mg total) by mouth daily. 90 tablet 3  . memantine (NAMENDA) 5 MG tablet Take 1 tab PO daily for 1 week then increase to 1 tab BID thereafter 180 tablet 3  . metFORMIN (GLUCOPHAGE) 500 MG tablet TAKE 1 TABLET BY MOUTH DAILY 30 tablet 9  . omega-3 acid ethyl esters (LOVAZA) 1 g capsule Take 1 g by mouth daily.    Vladimir Faster Glycol-Propyl Glycol (SYSTANE) 0.4-0.3 % SOLN Place 2 drops into both eyes every 8 (eight) hours as needed (for dry eyes).     . pravastatin (PRAVACHOL) 40 MG tablet Take 40 mg by mouth daily.     No facility-administered medications prior to visit.      Allergies:   Patient has no known allergies.   Social History   Socioeconomic History  . Marital status: Divorced    Spouse name: Not on file  . Number of children: 1  . Years of education: BSN  . Highest education level: Not on file  Occupational History  . Occupation: RETIRED    Employer: RETIRED  Social Needs  . Financial resource  strain: Not on file  . Food insecurity:    Worry: Not on file    Inability: Not on file  . Transportation needs:    Medical: Not on file    Non-medical: Not on file  Tobacco Use  . Smoking status: Never Smoker  . Smokeless tobacco: Never Used  Substance and Sexual Activity  . Alcohol use: Never    Frequency: Never  . Drug use: Never  . Sexual activity: Not on file  Lifestyle  .  Physical activity:    Days per week: Not on file    Minutes per session: Not on file  . Stress: Not on file  Relationships  . Social connections:    Talks on phone: Not on file    Gets together: Not on file    Attends religious service: Not on file    Active member of club or organization: Not on file    Attends meetings of clubs or organizations: Not on file    Relationship status: Not on file  Other Topics Concern  . Not on file  Social History Narrative   Patient is divorced and lives alone.   Patient has one adult daughter.   Patient is retired.   Patient has a BSN degree in nursing.   Patient is right-handed.   Patient drinks one soda per week.     Family History:  The patient's family history includes Diabetes in her brother and other; Heart failure (age of onset: 78) in her daughter; Hypertension in her brother.   ROS:   Please see the history of present illness.     All other systems are reviewed and are negative  PHYSICAL EXAM:   VS:  BP (!) 146/66   Pulse 66   Ht 5\' 5"  (1.651 m)   Wt 131 lb (59.4 kg)   BMI 21.80 kg/m       General: Alert, oriented x3, no distress, very lean, well-healed left subclavian pacemaker site Head: no evidence of trauma, PERRL, EOMI, no exophtalmos or lid lag, no myxedema, no xanthelasma; normal ears, nose and oropharynx Neck: normal jugular venous pulsations and no hepatojugular reflux; brisk carotid pulses without delay and no carotid bruits Chest: clear to auscultation, no signs of consolidation by percussion or palpation, normal fremitus,  symmetrical and full respiratory excursions Cardiovascular: normal position and quality of the apical impulse, regular rhythm, normal first and paradoxically split second heart sounds, no murmurs, rubs or gallops Abdomen: no tenderness or distention, no masses by palpation, no abnormal pulsatility or arterial bruits, normal bowel sounds, no hepatosplenomegaly Extremities: no clubbing, cyanosis or edema; 2+ radial, ulnar and brachial pulses bilaterally; 2+ right femoral, posterior tibial and dorsalis pedis pulses; 2+ left femoral, posterior tibial and dorsalis pedis pulses; no subclavian or femoral bruits Neurological: grossly nonfocal Psych: Normal mood and affect   Wt Readings from Last 3 Encounters:  10/28/18 131 lb (59.4 kg)  09/23/18 129 lb 12.8 oz (58.9 kg)  07/09/18 127 lb 13.9 oz (58 kg)      Studies/Labs Reviewed:   EKG:  EKG is ordered today.  ECG shows atrial fibrillation,  ventricular paced rhythm, QTC 484 ms 07/06/2018: BUN 28; Creatinine, Ser 1.44; Hemoglobin 12.0; Platelets 177; Potassium 4.5; Sodium 141   Lipid Panel    Component Value Date/Time   CHOL 139 02/02/2014 1106   TRIG 47 02/02/2014 1106   HDL 54 02/02/2014 1106   CHOLHDL 2.6 02/02/2014 1106   VLDL 9 02/02/2014 1106   LDLCALC 76 02/02/2014 1106     ASSESSMENT:    1. Pacemaker   2. Atrial fibrillation with slow ventricular response ([place])   3. Chronic diastolic heart failure ([place])   4. Essential hypertension   5. Hypercholesterolemia   6. Carotid stenosis, asymptomatic, right   7. PAD (peripheral artery disease) ([place])   8. Diabetes mellitus type 2 in nonobese ([facility])   9. Cognitive dysfunction      PLAN:  In order of problems listed above:  1. Afib: CHADSVasc  score 9. On Eliquis.  Monitor closely for falls or bleeding complications if the 2. PPM: Normal device function.  Well-healed site.  Discussed remote downloads every 3 months and yearly office visits. 3. CHF: Improved functional status,  asymptomatic, no edema, euvolemic without diuretics 4. HTN: Her blood pressure is usually very well controlled, little high today.  She has had previous problems of orthostatic hypotension.  No changes made to her medications. 5. HLP: on statin.  Labs checked at [facility] by Dr. Philip Aspen. 6. Right carotid stenosis: No focal neurological complaints. 7. PAD: Denies claudication but is sedentary. She has mostly infrapopliteal disease (bilateral two-vessel runoff, ABI 0.97 on right 0.90 on left, 2017).  She does not have any intermittent claudication (sedentary). 8. DM: Reports good control on metformin monotherapy 9. Falls and early cognitive dysfunction: Only one fall in last 12 months.  She occasionally has her daughter help her with answering some questions.  I do not really detect any deterioration otherwise.    Medication Adjustments/Labs and Tests Ordered: Current medicines are reviewed at length with the patient today.  Concerns regarding medicines are outlined above.  Medication changes, Labs and Tests ordered today are listed in the Patient Instructions below. Patient Instructions  Medication Instructions:  Dr Sallyanne Kuster recommends that you continue on your current medications as directed. Please refer to the Current Medication list given to you today.  If you need a refill on your cardiac medications before your next appointment, please call your pharmacy.   Testing/Procedures: Remote monitoring is used to monitor your Pacemaker of ICD from home. This monitoring reduces the number of office visits required to check your device to one time per year. It allows [place] to keep an eye on the functioning of your device to ensure it is working properly. You are scheduled for a device check from home on Wednesday, February 5th, 2020. You may send your transmission at any time that day. If you have a wireless device, the transmission will be sent automatically. After your physician reviews your  transmission, you will receive a postcard with your next transmission date.  Follow-Up: At [facility], you and your health needs are our priority.  As part of our continuing mission to provide you with exceptional heart care, we have created designated Provider Care Teams.  These Care Teams include your primary Cardiologist (physician) and Advanced Practice Providers (APPs -  Physician Assistants and Nurse Practitioners) who all work together to provide you with the care you need, when you need it.  You will need a follow up appointment in 6 months. Please call our office 2 months in advance to schedule this appointment. You may see Sanda Klein, MD or one of the following Advanced Practice Providers on your designated Care Team: [place], [place] . Fabian Sharp, PA-C    Signed, Sanda Klein, MD  10/30/2018 3:39 PM    [address], [place], [place]  [address] Phone: [phone]; Fax: [phone]

## 2018-11-06 DIAGNOSIS — G4730 Sleep apnea, unspecified: Secondary | ICD-10-CM | POA: Diagnosis not present

## 2018-11-09 DIAGNOSIS — G4730 Sleep apnea, unspecified: Secondary | ICD-10-CM | POA: Diagnosis not present

## 2018-11-09 LAB — CUP PACEART INCLINIC DEVICE CHECK
Battery Voltage: 3.04 V
Brady Statistic RV Percent Paced: 91 %
Date Time Interrogation Session: 20191118095716
Implantable Lead Location: 753860
Implantable Pulse Generator Implant Date: 20190717
Lead Channel Impedance Value: 540 Ohm
Lead Channel Setting Pacing Amplitude: 0.75 V
Lead Channel Setting Pacing Pulse Width: 0.5 ms
Lead Channel Setting Sensing Sensitivity: 2 mV
MDC IDC LEAD IMPLANT DT: 20190717
MDC IDC MSMT BATTERY REMAINING LONGEVITY: 115 mo
MDC IDC MSMT BATTERY REMAINING PERCENTAGE: 95 % — AB
MDC IDC MSMT LEADCHNL RV PACING THRESHOLD AMPLITUDE: 0.5 V
MDC IDC MSMT LEADCHNL RV PACING THRESHOLD PULSEWIDTH: 0.5 ms
MDC IDC MSMT LEADCHNL RV SENSING INTR AMPL: 11.5 mV
Pulse Gen Model: 1272
Pulse Gen Serial Number: 9002800

## 2018-11-10 DIAGNOSIS — H3562 Retinal hemorrhage, left eye: Secondary | ICD-10-CM | POA: Diagnosis not present

## 2018-11-10 DIAGNOSIS — Z961 Presence of intraocular lens: Secondary | ICD-10-CM | POA: Diagnosis not present

## 2018-11-10 DIAGNOSIS — H10413 Chronic giant papillary conjunctivitis, bilateral: Secondary | ICD-10-CM | POA: Diagnosis not present

## 2018-11-10 DIAGNOSIS — H401131 Primary open-angle glaucoma, bilateral, mild stage: Secondary | ICD-10-CM | POA: Diagnosis not present

## 2018-11-10 DIAGNOSIS — E119 Type 2 diabetes mellitus without complications: Secondary | ICD-10-CM | POA: Diagnosis not present

## 2018-11-10 DIAGNOSIS — E113293 Type 2 diabetes mellitus with mild nonproliferative diabetic retinopathy without macular edema, bilateral: Secondary | ICD-10-CM | POA: Diagnosis not present

## 2018-11-10 DIAGNOSIS — H04123 Dry eye syndrome of bilateral lacrimal glands: Secondary | ICD-10-CM | POA: Diagnosis not present

## 2018-11-25 ENCOUNTER — Telehealth: Payer: Self-pay | Admitting: Cardiology

## 2018-11-25 NOTE — Telephone Encounter (Signed)
Called and pt husband reported that the pt and her daughter, [name] are currently in [place] and will have them call us back.

## 2018-11-25 NOTE — Telephone Encounter (Signed)
Spoke with Donna Owens the pts daughter and she is reporting that the pt has a bump she noticed this morning right above her pacer incision site.. She says it is itchy but no redness, heat, swelling, drainage, and no fever. She could not tell me the size of the bump but will call back after she sees the pt.. After talking with our device nurses.. It is procedure to assume it could potentially be an infection until proven otherwise.. Pt to see Dr. Sallyanne Kuster tomorrow morning at 9:20am.. Will call if anything changes.

## 2018-11-25 NOTE — Telephone Encounter (Signed)
Patient daughter called and stated that patient called her this morning and told her there was a bump above the incision site of her device. Pt daughter has no other details at this time.

## 2018-11-25 NOTE — Telephone Encounter (Signed)
° °  Please return call to daughter [name].

## 2018-11-26 ENCOUNTER — Encounter: Payer: Self-pay | Admitting: Cardiovascular Disease

## 2018-11-26 ENCOUNTER — Ambulatory Visit (INDEPENDENT_AMBULATORY_CARE_PROVIDER_SITE_OTHER): Payer: Medicare Other | Admitting: Cardiovascular Disease

## 2018-11-26 VITALS — BP 137/67 | HR 62 | Ht 65.0 in | Wt 129.6 lb

## 2018-11-26 DIAGNOSIS — Z7901 Long term (current) use of anticoagulants: Secondary | ICD-10-CM | POA: Diagnosis not present

## 2018-11-26 DIAGNOSIS — Z950 Presence of cardiac pacemaker: Secondary | ICD-10-CM

## 2018-11-26 DIAGNOSIS — I5032 Chronic diastolic (congestive) heart failure: Secondary | ICD-10-CM

## 2018-11-26 DIAGNOSIS — E119 Type 2 diabetes mellitus without complications: Secondary | ICD-10-CM

## 2018-11-26 DIAGNOSIS — I6521 Occlusion and stenosis of right carotid artery: Secondary | ICD-10-CM | POA: Diagnosis not present

## 2018-11-26 DIAGNOSIS — I739 Peripheral vascular disease, unspecified: Secondary | ICD-10-CM

## 2018-11-26 DIAGNOSIS — E785 Hyperlipidemia, unspecified: Secondary | ICD-10-CM | POA: Diagnosis not present

## 2018-11-26 DIAGNOSIS — I4891 Unspecified atrial fibrillation: Secondary | ICD-10-CM

## 2018-11-26 DIAGNOSIS — Z79899 Other long term (current) drug therapy: Secondary | ICD-10-CM

## 2018-11-26 DIAGNOSIS — W19XXXD Unspecified fall, subsequent encounter: Secondary | ICD-10-CM

## 2018-11-26 DIAGNOSIS — I10 Essential (primary) hypertension: Secondary | ICD-10-CM

## 2018-11-26 LAB — COMPREHENSIVE METABOLIC PANEL
ALBUMIN: 4.4 g/dL (ref 3.5–4.7)
ALT: 14 IU/L (ref 0–32)
AST: 23 IU/L (ref 0–40)
Albumin/Globulin Ratio: 1.3 (ref 1.2–2.2)
Alkaline Phosphatase: 80 IU/L (ref 39–117)
BUN / CREAT RATIO: 22 (ref 12–28)
BUN: 30 mg/dL — AB (ref 8–27)
Bilirubin Total: 0.6 mg/dL (ref 0.0–1.2)
CHLORIDE: 103 mmol/L (ref 96–106)
CO2: 22 mmol/L (ref 20–29)
Calcium: 10.4 mg/dL — ABNORMAL HIGH (ref 8.7–10.3)
Creatinine, Ser: 1.35 mg/dL — ABNORMAL HIGH (ref 0.57–1.00)
GFR calc non Af Amer: 35 mL/min/{1.73_m2} — ABNORMAL LOW (ref >59)
GFR, EST AFRICAN AMERICAN: 40 mL/min/{1.73_m2} — AB (ref >59)
GLOBULIN, TOTAL: 3.3 g/dL (ref 1.5–4.5)
GLUCOSE: 72 mg/dL (ref 65–99)
Potassium: 4.8 mmol/L (ref 3.5–5.2)
Sodium: 140 mmol/L (ref 134–144)
Total Protein: 7.7 g/dL (ref 6.0–8.5)

## 2018-11-26 LAB — LIPID PANEL
Chol/HDL Ratio: 1.7 ratio (ref 0.0–4.4)
Cholesterol, Total: 152 mg/dL (ref 100–199)
HDL: 87 mg/dL (ref >39)
LDL Calculated: 57 mg/dL (ref 0–99)
Triglycerides: 38 mg/dL (ref 0–149)
VLDL CHOLESTEROL CAL: 8 mg/dL (ref 5–40)

## 2018-11-26 NOTE — Progress Notes (Signed)
Patient ID: Donna Owens, female   DOB: 1928/06/25, 84 y.o.   MRN: [id number] Patient ID: Donna Owens, female   DOB: [birth year], 84 y.o.   MRN: [id number]    Cardiology Office Note    Date:  11/29/2018   ID:  Donna Owens, DOB 1928-09-27, MRN [id number]  PCP:  Leanna Battles, MD  Cardiologist:   Sanda Klein, MD   Chief Complaint  Patient presents with  . Atrial Fibrillation  . Pacemaker Check    History of Present Illness:  Donna Owens is a 84 y.o. female with long term persistent atrial fibrillation with slow ventricular response, single-chamber permanent pacemaker (July 2019, [employer]), chronic diastolic heart failure, moderate carotid stenosis (Dr. Donnetta Hutching), HTN, DM, hyperlipidemia, severe neuropathy with unsteady gait and frequent falls.  Her daughter usually accompanies her, but is not here today, that is her one daughter is expecting to deliver today.  Donna Owens does not have any cardiovascular complaints, but describes a lot of pain in her left shoulder.  She points to the protrusion of the head of her clavicle above the joint space.  It is tender, but not red or swollen.  It does not appear to be fractured or frankly dislocated but does appear asymmetrical.  The patient specifically denies any chest pain at rest exertion, dyspnea at rest or with exertion, orthopnea, paroxysmal nocturnal dyspnea, syncope, palpitations, focal neurological deficits, intermittent claudication, lower extremity edema, unexplained weight gain, cough, hemoptysis or wheezing.  The pacemaker site itself does not bother her.  The device site is well-healed.  Device function is normal, the lead parameters are excellent.  Estimated generator longevity is >11 years.  She has 92% ventricular pacing.  The underlying rhythm is atrial fibrillation with a ventricular rate of 40-45 bpm.  She has not had any high ventricular rates.   Past Medical History:  Diagnosis Date  . Anemia   . Anxiety     . Arthritis    "back and right knee" (07/08/2018)  . Atrophic vaginitis   . Breast cancer, right breast ([place]) 2001  . Carotid arterial disease ([place]) 12/13/11    mild by doppler  . Chronic lower back pain   . CVA (cerebral vascular accident) [facility])    "dr never told me I had one" (07/08/2018)  . Depression   . Hyperlipemia   . Hypertension   . Migraine    "1-2/year" (07/08/2018)  . OSA (obstructive sleep apnea)   . OSA on CPAP 06/20/2014  . Osteoporosis   . Presence of permanent cardiac pacemaker 07/08/2018  . Pulmonary hypertension ([place])    moderate echo 11/07/10  . PVD (peripheral vascular disease) ([place])   . Sleep apnea   . Type II diabetes mellitus ([place])     Past Surgical History:  Procedure Laterality Date  . BREAST LUMPECTOMY Right 07/26/2000   chemo/radiation  . BREAST LUMPECTOMY Left    benign  . CARDIOVERSION N/A 07/21/2014   Procedure: CARDIOVERSION;  Surgeon: Sanda Klein, MD;  Location: [facility] END[facility];  Service: Cardiovascular;  Laterality: N/A;  . CATARACT EXTRACTION W/ INTRAOCULAR LENS  IMPLANT, BILATERAL Bilateral    "not sure about left eye; ?lens"  . COLONOSCOPY  2010  . INSERT / REPLACE / REMOVE PACEMAKER  07/08/2018  . PACEMAKER IMPLANT N/A 07/08/2018   Procedure: PACEMAKER IMPLANT;  Surgeon: Sanda Klein, MD;  Location: [place] CV LAB;  Service: Cardiovascular;  Laterality: N/A;  . TOENAIL EXCISION  03/2013   ingrown nail  Outpatient Medications Prior to Visit  Medication Sig Dispense Refill  . acetaminophen (TYLENOL) 325 MG tablet Take 325 mg by mouth every 6 (six) hours as needed for moderate pain or headache.     Marland Kitchen amLODipine (NORVASC) 5 MG tablet TAKE 1 TABLET(5 MG) BY MOUTH DAILY 90 tablet 2  . apixaban (ELIQUIS) 2.5 MG TABS tablet Take 1 tablet (2.5 mg total) by mouth 2 (two) times daily. 180 tablet 2  . Calcium Carbonate-Vitamin D (CALCIUM-VITAMIN D3 PO) Take 1 tablet by mouth daily.    . Cholecalciferol (VITAMIN D-3) 5000 units TABS Take  5,000 Units by mouth daily.     . Coenzyme Q10 (CO Q 10) 100 MG CAPS Take 100 mg by mouth daily.     . Cyanocobalamin (VITAMIN B-12) 2500 MCG SUBL Place 2,500 mcg under the tongue daily.    . divalproex (DEPAKOTE SPRINKLE) 125 MG capsule Take 125 mg by mouth daily.    . ferrous sulfate 325 (65 FE) MG tablet Take 325 mg by mouth daily.   12  . furosemide (LASIX) 20 MG tablet Take 20 mg by mouth every Monday, Wednesday, and Friday.    . gabapentin (NEURONTIN) 300 MG capsule Take 1 capsule (300 mg total) by mouth at bedtime. (Patient taking differently: Take 100 mg by mouth at bedtime. ) 90 capsule 11  . hydrOXYzine (ATARAX/VISTARIL) 25 MG tablet Take 1 tablet (25 mg total) by mouth 3 (three) times daily as needed for itching. 30 tablet 2  . metFORMIN (GLUCOPHAGE) 500 MG tablet TAKE 1 TABLET BY MOUTH DAILY 30 tablet 9  . omega-3 acid ethyl esters (LOVAZA) 1 g capsule Take 1 g by mouth daily.    Vladimir Faster Glycol-Propyl Glycol (SYSTANE) 0.4-0.3 % SOLN Place 2 drops into both eyes every 8 (eight) hours as needed (for dry eyes).     . pravastatin (PRAVACHOL) 40 MG tablet Take 40 mg by mouth daily.    . benzonatate (TESSALON) 100 MG capsule Take 1 capsule by mouth 3 (three) times daily as needed for cough.  0  . HYDROcodone-acetaminophen (NORCO/VICODIN) 5-325 MG tablet Take 1 tablet by mouth every 6 (six) hours as needed for up to 4 doses for severe pain. 30 tablet 0  . lidocaine (LIDODERM) 5 % Place 1 patch onto the skin daily. Remove & Discard patch within 12 hours or as directed by MD 30 patch 0  . Lidocaine HCl (ASPERCREME LIDOCAINE) 4 % LIQD Apply 1 application topically daily.    Marland Kitchen losartan (COZAAR) 25 MG tablet Take 1 tablet (25 mg total) by mouth daily. 90 tablet 3  . memantine (NAMENDA) 5 MG tablet Take 1 tab PO daily for 1 week then increase to 1 tab BID thereafter 180 tablet 3   No facility-administered medications prior to visit.      Allergies:   Patient has no known allergies.    Social History   Socioeconomic History  . Marital status: Divorced    Spouse name: Not on file  . Number of children: 1  . Years of education: BSN  . Highest education level: Not on file  Occupational History  . Occupation: RETIRED    Employer: RETIRED  Social Needs  . Financial resource strain: Not on file  . Food insecurity:    Worry: Not on file    Inability: Not on file  . Transportation needs:    Medical: Not on file    Non-medical: Not on file  Tobacco Use  . Smoking status:  Never Smoker  . Smokeless tobacco: Never Used  Substance and Sexual Activity  . Alcohol use: Never    Frequency: Never  . Drug use: Never  . Sexual activity: Not on file  Lifestyle  . Physical activity:    Days per week: Not on file    Minutes per session: Not on file  . Stress: Not on file  Relationships  . Social connections:    Talks on phone: Not on file    Gets together: Not on file    Attends religious service: Not on file    Active member of club or organization: Not on file    Attends meetings of clubs or organizations: Not on file    Relationship status: Not on file  Other Topics Concern  . Not on file  Social History Narrative   Patient is divorced and lives alone.   Patient has one adult daughter.   Patient is retired.   Patient has a BSN degree in nursing.   Patient is right-handed.   Patient drinks one soda per week.     Family History:  The patient's family history includes Diabetes in her brother and other; Heart failure (age of onset: 4) in her daughter; Hypertension in her brother.   ROS:   Please see the history of present illness.    All other systems are reviewed and are negative   PHYSICAL EXAM:   VS:  BP 137/67   Pulse 62   Ht 5\' 5"  (1.651 m)   Wt 129 lb 9.6 oz (58.8 kg)   BMI 21.57 kg/m      General: Alert, oriented x3, no distress, very slender, borderline underweight Head: no evidence of trauma, PERRL, EOMI, no exophtalmos or lid lag, no  myxedema, no xanthelasma; normal ears, nose and oropharynx Neck: normal jugular venous pulsations and no hepatojugular reflux; brisk carotid pulses without delay and no carotid bruits Chest: clear to auscultation, no signs of consolidation by percussion or palpation, normal fremitus, symmetrical and full respiratory excursions.  Well-healed left subclavian pacemaker site Cardiovascular: normal position and quality of the apical impulse, regular rhythm, normal first and second heart sounds, no murmurs, rubs or gallops Abdomen: no tenderness or distention, no masses by palpation, no abnormal pulsatility or arterial bruits, normal bowel sounds, no hepatosplenomegaly Extremities: no clubbing, cyanosis or edema; the lateral head of the clavicle was protrusive on exam of her left shoulder.  It appears to be asymmetrical.  I do not see evidence of fracture or dislocation.  The site is mildly tender, but is not red or swollen. Neurological: grossly nonfocal Psych: Normal mood and affect    Wt Readings from Last 3 Encounters:  11/26/18 129 lb 9.6 oz (58.8 kg)  10/28/18 131 lb (59.4 kg)  09/23/18 129 lb 12.8 oz (58.9 kg)      Studies/Labs Reviewed:   EKG:  EKG is ordered today.  ECG shows atrial fibrillation,  ventricular paced rhythm, QTC 484 ms 07/06/2018: Hemoglobin 12.0; Platelets 177 11/26/2018: ALT 14; BUN 30; Creatinine, Ser 1.35; Potassium 4.8; Sodium 140   Lipid Panel    Component Value Date/Time   CHOL 152 11/26/2018 1004   TRIG 38 11/26/2018 1004   HDL 87 11/26/2018 1004   CHOLHDL 1.7 11/26/2018 1004   CHOLHDL 2.6 02/02/2014 1106   VLDL 9 02/02/2014 1106   LDLCALC 57 11/26/2018 1004     ASSESSMENT:    1. Dyslipidemia   2. Atrial fibrillation with slow ventricular response (HCC)  3. Long term current use of anticoagulant   4. Pacemaker   5. Chronic diastolic heart failure ([place])   6. Essential hypertension   7. Carotid stenosis, right   8. PAD (peripheral artery disease)  ([place])   9. Diabetes mellitus type 2 in nonobese (HCC)   10. Fall, subsequent encounter   11. Medication management      PLAN:  In order of problems listed above:  1. Afib: Spontaneously slow ventricular response which was symptomatic and led to pacemaker implantation CHADSVasc score 9.  2. Eliquis: No recent falls, bleeding or other complications. 3. PPM: Remote downloads every 2 months and yearly office visits.  Normal device function.  High frequency of ventricular pacing due to underlying slow rate. 4. CHF: As far as I can tell she is euvolemic, NYHA functional class II. 5. HTN: Mildly elevated systolic blood pressure, but she is had issues with orthostatic hypotension.  No changes were made to her medications. 6. HLP: on statin, excellent LDL cholesterol. 7. Right carotid stenosis: Asymptomatic no focal neurological complaints. 8. PAD: Mild PAD without claudication (sedentary. She has mostly infrapopliteal disease (bilateral two-vessel runoff, ABI 0.97 on right 0.90 on left, 2017).  She does not have any intermittent claudication (sedentary). 9. DM: She reports good glycemic control and only requires low dose of metformin. 10. Falls and early cognitive dysfunction: No recent falls.  Stable memory status.    Medication Adjustments/Labs and Tests Ordered: Current medicines are reviewed at length with the patient today.  Concerns regarding medicines are outlined above.  Medication changes, Labs and Tests ordered today are listed in the Patient Instructions below. Patient Instructions  Medication Instructions:  Dr Sallyanne Kuster recommends that you continue on your current medications as directed. Please refer to the Current Medication list given to you today.  If you need a refill on your cardiac medications before your next appointment, please call your pharmacy.   Lab work: Your physician recommends that you return for lab work TODAY.  If you have labs (blood work) drawn today and your  tests are completely normal, you will receive your results only by: Marland Kitchen MyChart Message (if you have MyChart) OR . A paper copy in the mail If you have any lab test that is abnormal or we need to change your treatment, we will call you to review the results.  Testing/Procedures: Remote monitoring is used to monitor your Pacemaker of ICD from home. This monitoring reduces the number of office visits required to check your device to one time per year. It allows [place] to keep an eye on the functioning of your device to ensure it is working properly. You are scheduled for a device check from home on Wednesday, February 5th, 2020. You may send your transmission at any time that day. If you have a wireless device, the transmission will be sent automatically. After your physician reviews your transmission, you will receive a postcard with your next transmission date.  Follow-Up: At [facility], you and your health needs are our priority.  As part of our continuing mission to provide you with exceptional heart care, we have created designated Provider Care Teams.  These Care Teams include your primary Cardiologist (physician) and Advanced Practice Providers (APPs -  Physician Assistants and Nurse Practitioners) who all work together to provide you with the care you need, when you need it. You will need a follow up appointment in 6 months.  Please call our office 2 months in advance to schedule this appointment.  You may see Sanda Klein, MD or one of the following Advanced Practice Providers on your designated Care Team: [place], [place] . Owens Sharp, PA-C    Signed, Sanda Klein, MD  11/29/2018 1:01 PM    [facility] [place], [place], [place]  [address] Phone: [phone]; Fax: [phone]

## 2018-11-26 NOTE — Patient Instructions (Signed)
Medication Instructions:  Dr Sallyanne Kuster recommends that you continue on your current medications as directed. Please refer to the Current Medication list given to you today.  If you need a refill on your cardiac medications before your next appointment, please call your pharmacy.   Lab work: Your physician recommends that you return for lab work TODAY.  If you have labs (blood work) drawn today and your tests are completely normal, you will receive your results only by: Marland Kitchen MyChart Message (if you have MyChart) OR . A paper copy in the mail If you have any lab test that is abnormal or we need to change your treatment, we will call you to review the results.  Testing/Procedures: Remote monitoring is used to monitor your Pacemaker of ICD from home. This monitoring reduces the number of office visits required to check your device to one time per year. It allows [place] to keep an eye on the functioning of your device to ensure it is working properly. You are scheduled for a device check from home on Wednesday, February 5th, 2020. You may send your transmission at any time that day. If you have a wireless device, the transmission will be sent automatically. After your physician reviews your transmission, you will receive a postcard with your next transmission date.  Follow-Up: At [facility], you and your health needs are our priority.  As part of our continuing mission to provide you with exceptional heart care, we have created designated Provider Care Teams.  These Care Teams include your primary Cardiologist (physician) and Advanced Practice Providers (APPs -  Physician Assistants and Nurse Practitioners) who all work together to provide you with the care you need, when you need it. You will need a follow up appointment in 6 months.  Please call our office 2 months in advance to schedule this appointment.  You may see Sanda Klein, MD or one of the following Advanced Practice Providers on your  designated Care Team: [place], [place] . Fabian Sharp, PA-C

## 2018-11-29 ENCOUNTER — Encounter: Payer: Self-pay | Admitting: Cardiovascular Disease

## 2018-11-30 ENCOUNTER — Ambulatory Visit: Payer: Medicare Other | Admitting: Cardiovascular Disease

## 2018-12-09 DIAGNOSIS — E1151 Type 2 diabetes mellitus with diabetic peripheral angiopathy without gangrene: Secondary | ICD-10-CM | POA: Diagnosis not present

## 2019-01-12 DIAGNOSIS — M79671 Pain in right foot: Secondary | ICD-10-CM | POA: Diagnosis not present

## 2019-01-12 DIAGNOSIS — E1151 Type 2 diabetes mellitus with diabetic peripheral angiopathy without gangrene: Secondary | ICD-10-CM | POA: Diagnosis not present

## 2019-01-12 DIAGNOSIS — R600 Localized edema: Secondary | ICD-10-CM | POA: Diagnosis not present

## 2019-01-12 DIAGNOSIS — M79672 Pain in left foot: Secondary | ICD-10-CM | POA: Diagnosis not present

## 2019-01-18 DIAGNOSIS — R3915 Urgency of urination: Secondary | ICD-10-CM | POA: Diagnosis not present

## 2019-01-18 DIAGNOSIS — R350 Frequency of micturition: Secondary | ICD-10-CM | POA: Diagnosis not present

## 2019-01-18 DIAGNOSIS — N952 Postmenopausal atrophic vaginitis: Secondary | ICD-10-CM | POA: Diagnosis not present

## 2019-01-18 LAB — CUP PACEART INCLINIC DEVICE CHECK
Date Time Interrogation Session: 20200127142513
Implantable Lead Implant Date: 20190717
Implantable Pulse Generator Implant Date: 20190717
MDC IDC LEAD LOCATION: 753860
MDC IDC PG SERIAL: 9002800
Pulse Gen Model: 1272

## 2019-01-27 ENCOUNTER — Ambulatory Visit (INDEPENDENT_AMBULATORY_CARE_PROVIDER_SITE_OTHER): Payer: Medicare Other

## 2019-01-27 DIAGNOSIS — R001 Bradycardia, unspecified: Secondary | ICD-10-CM | POA: Diagnosis not present

## 2019-01-27 DIAGNOSIS — I4891 Unspecified atrial fibrillation: Secondary | ICD-10-CM

## 2019-01-29 LAB — CUP PACEART REMOTE DEVICE CHECK
Battery Remaining Longevity: 119 mo
Brady Statistic RV Percent Paced: 93 %
Date Time Interrogation Session: 20200205070013
Lead Channel Sensing Intrinsic Amplitude: 11.2 mV
Lead Channel Setting Pacing Amplitude: 2.5 V
Lead Channel Setting Pacing Pulse Width: 0.5 ms
Lead Channel Setting Sensing Sensitivity: 2 mV
MDC IDC LEAD IMPLANT DT: 20190717
MDC IDC LEAD LOCATION: 753860
MDC IDC MSMT BATTERY REMAINING PERCENTAGE: 95.5 %
MDC IDC MSMT BATTERY VOLTAGE: 3.02 V
MDC IDC MSMT LEADCHNL RV IMPEDANCE VALUE: 530 Ohm
MDC IDC MSMT LEADCHNL RV PACING THRESHOLD AMPLITUDE: 0.5 V
MDC IDC MSMT LEADCHNL RV PACING THRESHOLD PULSEWIDTH: 0.5 ms
MDC IDC PG IMPLANT DT: 20190717
Pulse Gen Model: 1272
Pulse Gen Serial Number: 9002800

## 2019-02-01 DIAGNOSIS — M542 Cervicalgia: Secondary | ICD-10-CM | POA: Diagnosis not present

## 2019-02-05 NOTE — Progress Notes (Signed)
Remote pacemaker transmission.   

## 2019-02-14 ENCOUNTER — Other Ambulatory Visit: Payer: Self-pay | Admitting: Cardiovascular Disease

## 2019-03-01 DIAGNOSIS — R600 Localized edema: Secondary | ICD-10-CM | POA: Diagnosis not present

## 2019-03-01 DIAGNOSIS — I87319 Chronic venous hypertension (idiopathic) with ulcer of unspecified lower extremity: Secondary | ICD-10-CM | POA: Diagnosis not present

## 2019-03-01 DIAGNOSIS — Z6821 Body mass index (BMI) 21.0-21.9, adult: Secondary | ICD-10-CM | POA: Diagnosis not present

## 2019-03-01 DIAGNOSIS — I739 Peripheral vascular disease, unspecified: Secondary | ICD-10-CM | POA: Diagnosis not present

## 2019-03-08 DIAGNOSIS — I87319 Chronic venous hypertension (idiopathic) with ulcer of unspecified lower extremity: Secondary | ICD-10-CM | POA: Diagnosis not present

## 2019-03-08 DIAGNOSIS — E1151 Type 2 diabetes mellitus with diabetic peripheral angiopathy without gangrene: Secondary | ICD-10-CM | POA: Diagnosis not present

## 2019-03-08 DIAGNOSIS — Z6821 Body mass index (BMI) 21.0-21.9, adult: Secondary | ICD-10-CM | POA: Diagnosis not present

## 2019-03-08 DIAGNOSIS — I10 Essential (primary) hypertension: Secondary | ICD-10-CM | POA: Diagnosis not present

## 2019-03-11 DIAGNOSIS — H60501 Unspecified acute noninfective otitis externa, right ear: Secondary | ICD-10-CM | POA: Diagnosis not present

## 2019-03-11 DIAGNOSIS — N183 Chronic kidney disease, stage 3 (moderate): Secondary | ICD-10-CM | POA: Diagnosis not present

## 2019-03-11 DIAGNOSIS — D631 Anemia in chronic kidney disease: Secondary | ICD-10-CM | POA: Diagnosis not present

## 2019-03-11 DIAGNOSIS — M109 Gout, unspecified: Secondary | ICD-10-CM | POA: Diagnosis not present

## 2019-03-11 DIAGNOSIS — E785 Hyperlipidemia, unspecified: Secondary | ICD-10-CM | POA: Diagnosis not present

## 2019-03-11 DIAGNOSIS — M5416 Radiculopathy, lumbar region: Secondary | ICD-10-CM | POA: Diagnosis not present

## 2019-03-11 DIAGNOSIS — E1151 Type 2 diabetes mellitus with diabetic peripheral angiopathy without gangrene: Secondary | ICD-10-CM | POA: Diagnosis not present

## 2019-03-11 DIAGNOSIS — E669 Obesity, unspecified: Secondary | ICD-10-CM | POA: Diagnosis not present

## 2019-03-11 DIAGNOSIS — F329 Major depressive disorder, single episode, unspecified: Secondary | ICD-10-CM | POA: Diagnosis not present

## 2019-03-11 DIAGNOSIS — M810 Age-related osteoporosis without current pathological fracture: Secondary | ICD-10-CM | POA: Diagnosis not present

## 2019-03-11 DIAGNOSIS — E1122 Type 2 diabetes mellitus with diabetic chronic kidney disease: Secondary | ICD-10-CM | POA: Diagnosis not present

## 2019-03-11 DIAGNOSIS — I4892 Unspecified atrial flutter: Secondary | ICD-10-CM | POA: Diagnosis not present

## 2019-03-11 DIAGNOSIS — I129 Hypertensive chronic kidney disease with stage 1 through stage 4 chronic kidney disease, or unspecified chronic kidney disease: Secondary | ICD-10-CM | POA: Diagnosis not present

## 2019-03-11 DIAGNOSIS — H6123 Impacted cerumen, bilateral: Secondary | ICD-10-CM | POA: Diagnosis not present

## 2019-03-11 DIAGNOSIS — L97821 Non-pressure chronic ulcer of other part of left lower leg limited to breakdown of skin: Secondary | ICD-10-CM | POA: Diagnosis not present

## 2019-03-11 DIAGNOSIS — Z4800 Encounter for change or removal of nonsurgical wound dressing: Secondary | ICD-10-CM | POA: Diagnosis not present

## 2019-03-11 DIAGNOSIS — H9193 Unspecified hearing loss, bilateral: Secondary | ICD-10-CM | POA: Diagnosis not present

## 2019-03-11 DIAGNOSIS — G4733 Obstructive sleep apnea (adult) (pediatric): Secondary | ICD-10-CM | POA: Diagnosis not present

## 2019-03-11 DIAGNOSIS — F5104 Psychophysiologic insomnia: Secondary | ICD-10-CM | POA: Diagnosis not present

## 2019-03-11 DIAGNOSIS — M1612 Unilateral primary osteoarthritis, left hip: Secondary | ICD-10-CM | POA: Diagnosis not present

## 2019-03-11 DIAGNOSIS — I872 Venous insufficiency (chronic) (peripheral): Secondary | ICD-10-CM | POA: Diagnosis not present

## 2019-03-11 DIAGNOSIS — J449 Chronic obstructive pulmonary disease, unspecified: Secondary | ICD-10-CM | POA: Diagnosis not present

## 2019-03-11 DIAGNOSIS — E46 Unspecified protein-calorie malnutrition: Secondary | ICD-10-CM | POA: Diagnosis not present

## 2019-03-11 DIAGNOSIS — Z7984 Long term (current) use of oral hypoglycemic drugs: Secondary | ICD-10-CM | POA: Diagnosis not present

## 2019-03-11 DIAGNOSIS — M5137 Other intervertebral disc degeneration, lumbosacral region: Secondary | ICD-10-CM | POA: Diagnosis not present

## 2019-03-15 DIAGNOSIS — I129 Hypertensive chronic kidney disease with stage 1 through stage 4 chronic kidney disease, or unspecified chronic kidney disease: Secondary | ICD-10-CM | POA: Diagnosis not present

## 2019-03-15 DIAGNOSIS — Z4800 Encounter for change or removal of nonsurgical wound dressing: Secondary | ICD-10-CM | POA: Diagnosis not present

## 2019-03-15 DIAGNOSIS — L97821 Non-pressure chronic ulcer of other part of left lower leg limited to breakdown of skin: Secondary | ICD-10-CM | POA: Diagnosis not present

## 2019-03-15 DIAGNOSIS — E1151 Type 2 diabetes mellitus with diabetic peripheral angiopathy without gangrene: Secondary | ICD-10-CM | POA: Diagnosis not present

## 2019-03-15 DIAGNOSIS — I872 Venous insufficiency (chronic) (peripheral): Secondary | ICD-10-CM | POA: Diagnosis not present

## 2019-03-15 DIAGNOSIS — E1122 Type 2 diabetes mellitus with diabetic chronic kidney disease: Secondary | ICD-10-CM | POA: Diagnosis not present

## 2019-03-16 ENCOUNTER — Other Ambulatory Visit: Payer: Self-pay | Admitting: Cardiovascular Disease

## 2019-03-17 ENCOUNTER — Encounter (INDEPENDENT_AMBULATORY_CARE_PROVIDER_SITE_OTHER): Payer: Medicare Other | Admitting: Ophthalmology

## 2019-03-18 DIAGNOSIS — E1151 Type 2 diabetes mellitus with diabetic peripheral angiopathy without gangrene: Secondary | ICD-10-CM | POA: Diagnosis not present

## 2019-03-18 DIAGNOSIS — E1122 Type 2 diabetes mellitus with diabetic chronic kidney disease: Secondary | ICD-10-CM | POA: Diagnosis not present

## 2019-03-18 DIAGNOSIS — L97821 Non-pressure chronic ulcer of other part of left lower leg limited to breakdown of skin: Secondary | ICD-10-CM | POA: Diagnosis not present

## 2019-03-18 DIAGNOSIS — I129 Hypertensive chronic kidney disease with stage 1 through stage 4 chronic kidney disease, or unspecified chronic kidney disease: Secondary | ICD-10-CM | POA: Diagnosis not present

## 2019-03-18 DIAGNOSIS — Z4800 Encounter for change or removal of nonsurgical wound dressing: Secondary | ICD-10-CM | POA: Diagnosis not present

## 2019-03-18 DIAGNOSIS — I872 Venous insufficiency (chronic) (peripheral): Secondary | ICD-10-CM | POA: Diagnosis not present

## 2019-03-19 ENCOUNTER — Other Ambulatory Visit: Payer: Self-pay | Admitting: Cardiovascular Disease

## 2019-03-21 DIAGNOSIS — L03115 Cellulitis of right lower limb: Secondary | ICD-10-CM | POA: Diagnosis not present

## 2019-03-22 DIAGNOSIS — E1122 Type 2 diabetes mellitus with diabetic chronic kidney disease: Secondary | ICD-10-CM | POA: Diagnosis not present

## 2019-03-22 DIAGNOSIS — L97821 Non-pressure chronic ulcer of other part of left lower leg limited to breakdown of skin: Secondary | ICD-10-CM | POA: Diagnosis not present

## 2019-03-22 DIAGNOSIS — I872 Venous insufficiency (chronic) (peripheral): Secondary | ICD-10-CM | POA: Diagnosis not present

## 2019-03-22 DIAGNOSIS — E1151 Type 2 diabetes mellitus with diabetic peripheral angiopathy without gangrene: Secondary | ICD-10-CM | POA: Diagnosis not present

## 2019-03-22 DIAGNOSIS — I129 Hypertensive chronic kidney disease with stage 1 through stage 4 chronic kidney disease, or unspecified chronic kidney disease: Secondary | ICD-10-CM | POA: Diagnosis not present

## 2019-03-22 DIAGNOSIS — Z4800 Encounter for change or removal of nonsurgical wound dressing: Secondary | ICD-10-CM | POA: Diagnosis not present

## 2019-03-22 NOTE — Telephone Encounter (Signed)
Amlodipine 5 mg #90 w/ 1 RF.

## 2019-03-23 ENCOUNTER — Telehealth: Payer: Self-pay | Admitting: Neurology

## 2019-03-23 NOTE — Telephone Encounter (Signed)
Called the patient and spoke with her daughter to inform them that our office has placed new protocols in place for our office visits. Due to the virus pandemic our office is reducing our number of office visits in order to minimize the risk to our patients and healthcare providers. Advised that our office is now providing the capability to offer the patients phone visits at this time. Informed of what that process looks like and informed that the telephone office visit will still be billed through insurance and due to [place] we need them to know since the appointment is taking place over the phone, we can't guarantee the security of the phone line. With that said if we do move forward I would have to get verbal consent to completed the call over the phone. Patient's daughter has given verbal consent to completed the virtual visit. I have given her the instructions on how to download the app. Informed her that we wont be able to complete a memory test as we normally would for this meeting but they could discuss as the patients daughter states that it has been difficulty with the pt understanding that she has memory concerns. Patient has been accusing stating that ppl are taking things when it isn't true. The daughter plans to move in with her when her lease runs out but that is not until November. Until then the patient doesn't think there is anything wrong and is forgeting to pay bills and things. The daughter reviewed her chart with me and states that things are updated. Pt's email is [email or web address]. Pt understands that the cisco webex software must be downloaded and operational on the device pt plans to use for the visit. E-mail sent to the daughter and verbalized that she must be with the patient for the apt. She verbalized understanding.

## 2019-03-24 NOTE — Telephone Encounter (Signed)
Patient's daughter called in and wanted to reschedule the apt as she will not be able to confirm if she will be readily available due to a surgery her husband has scheduled for tomorrow. I advised that was not a problem and we have the patient rescheduled for a video visit on 03/30/2019 at 2:30 pm. I will resend a new e mail for the patient to the daughter's email. Pt's daughter verbalized that she has the downloaded the app and understood what to do at the time of apt and will be ready 15 min prior to the apt.

## 2019-03-25 ENCOUNTER — Ambulatory Visit: Payer: Medicare Other | Admitting: Neurology

## 2019-03-25 DIAGNOSIS — I129 Hypertensive chronic kidney disease with stage 1 through stage 4 chronic kidney disease, or unspecified chronic kidney disease: Secondary | ICD-10-CM | POA: Diagnosis not present

## 2019-03-25 DIAGNOSIS — E1151 Type 2 diabetes mellitus with diabetic peripheral angiopathy without gangrene: Secondary | ICD-10-CM | POA: Diagnosis not present

## 2019-03-25 DIAGNOSIS — L97821 Non-pressure chronic ulcer of other part of left lower leg limited to breakdown of skin: Secondary | ICD-10-CM | POA: Diagnosis not present

## 2019-03-25 DIAGNOSIS — I872 Venous insufficiency (chronic) (peripheral): Secondary | ICD-10-CM | POA: Diagnosis not present

## 2019-03-25 DIAGNOSIS — Z4800 Encounter for change or removal of nonsurgical wound dressing: Secondary | ICD-10-CM | POA: Diagnosis not present

## 2019-03-25 DIAGNOSIS — E1122 Type 2 diabetes mellitus with diabetic chronic kidney disease: Secondary | ICD-10-CM | POA: Diagnosis not present

## 2019-03-29 DIAGNOSIS — I872 Venous insufficiency (chronic) (peripheral): Secondary | ICD-10-CM | POA: Diagnosis not present

## 2019-03-29 DIAGNOSIS — L97821 Non-pressure chronic ulcer of other part of left lower leg limited to breakdown of skin: Secondary | ICD-10-CM | POA: Diagnosis not present

## 2019-03-29 DIAGNOSIS — I129 Hypertensive chronic kidney disease with stage 1 through stage 4 chronic kidney disease, or unspecified chronic kidney disease: Secondary | ICD-10-CM | POA: Diagnosis not present

## 2019-03-29 DIAGNOSIS — E1122 Type 2 diabetes mellitus with diabetic chronic kidney disease: Secondary | ICD-10-CM | POA: Diagnosis not present

## 2019-03-29 DIAGNOSIS — Z4800 Encounter for change or removal of nonsurgical wound dressing: Secondary | ICD-10-CM | POA: Diagnosis not present

## 2019-03-29 DIAGNOSIS — E1151 Type 2 diabetes mellitus with diabetic peripheral angiopathy without gangrene: Secondary | ICD-10-CM | POA: Diagnosis not present

## 2019-03-30 ENCOUNTER — Ambulatory Visit: Payer: Medicare Other | Admitting: Neurology

## 2019-03-30 ENCOUNTER — Other Ambulatory Visit: Payer: Self-pay

## 2019-04-01 DIAGNOSIS — E1122 Type 2 diabetes mellitus with diabetic chronic kidney disease: Secondary | ICD-10-CM | POA: Diagnosis not present

## 2019-04-01 DIAGNOSIS — L97821 Non-pressure chronic ulcer of other part of left lower leg limited to breakdown of skin: Secondary | ICD-10-CM | POA: Diagnosis not present

## 2019-04-01 DIAGNOSIS — I872 Venous insufficiency (chronic) (peripheral): Secondary | ICD-10-CM | POA: Diagnosis not present

## 2019-04-01 DIAGNOSIS — Z4800 Encounter for change or removal of nonsurgical wound dressing: Secondary | ICD-10-CM | POA: Diagnosis not present

## 2019-04-01 DIAGNOSIS — I129 Hypertensive chronic kidney disease with stage 1 through stage 4 chronic kidney disease, or unspecified chronic kidney disease: Secondary | ICD-10-CM | POA: Diagnosis not present

## 2019-04-01 DIAGNOSIS — E1151 Type 2 diabetes mellitus with diabetic peripheral angiopathy without gangrene: Secondary | ICD-10-CM | POA: Diagnosis not present

## 2019-04-05 DIAGNOSIS — L97821 Non-pressure chronic ulcer of other part of left lower leg limited to breakdown of skin: Secondary | ICD-10-CM | POA: Diagnosis not present

## 2019-04-05 DIAGNOSIS — Z4800 Encounter for change or removal of nonsurgical wound dressing: Secondary | ICD-10-CM | POA: Diagnosis not present

## 2019-04-05 DIAGNOSIS — I129 Hypertensive chronic kidney disease with stage 1 through stage 4 chronic kidney disease, or unspecified chronic kidney disease: Secondary | ICD-10-CM | POA: Diagnosis not present

## 2019-04-05 DIAGNOSIS — E1151 Type 2 diabetes mellitus with diabetic peripheral angiopathy without gangrene: Secondary | ICD-10-CM | POA: Diagnosis not present

## 2019-04-05 DIAGNOSIS — E1122 Type 2 diabetes mellitus with diabetic chronic kidney disease: Secondary | ICD-10-CM | POA: Diagnosis not present

## 2019-04-05 DIAGNOSIS — I872 Venous insufficiency (chronic) (peripheral): Secondary | ICD-10-CM | POA: Diagnosis not present

## 2019-04-08 DIAGNOSIS — E1122 Type 2 diabetes mellitus with diabetic chronic kidney disease: Secondary | ICD-10-CM | POA: Diagnosis not present

## 2019-04-08 DIAGNOSIS — I872 Venous insufficiency (chronic) (peripheral): Secondary | ICD-10-CM | POA: Diagnosis not present

## 2019-04-08 DIAGNOSIS — I129 Hypertensive chronic kidney disease with stage 1 through stage 4 chronic kidney disease, or unspecified chronic kidney disease: Secondary | ICD-10-CM | POA: Diagnosis not present

## 2019-04-08 DIAGNOSIS — Z4800 Encounter for change or removal of nonsurgical wound dressing: Secondary | ICD-10-CM | POA: Diagnosis not present

## 2019-04-08 DIAGNOSIS — L97821 Non-pressure chronic ulcer of other part of left lower leg limited to breakdown of skin: Secondary | ICD-10-CM | POA: Diagnosis not present

## 2019-04-08 DIAGNOSIS — E1151 Type 2 diabetes mellitus with diabetic peripheral angiopathy without gangrene: Secondary | ICD-10-CM | POA: Diagnosis not present

## 2019-04-09 DIAGNOSIS — I87319 Chronic venous hypertension (idiopathic) with ulcer of unspecified lower extremity: Secondary | ICD-10-CM | POA: Diagnosis not present

## 2019-04-09 DIAGNOSIS — G309 Alzheimer's disease, unspecified: Secondary | ICD-10-CM | POA: Diagnosis not present

## 2019-04-09 DIAGNOSIS — E1151 Type 2 diabetes mellitus with diabetic peripheral angiopathy without gangrene: Secondary | ICD-10-CM | POA: Diagnosis not present

## 2019-04-09 DIAGNOSIS — I10 Essential (primary) hypertension: Secondary | ICD-10-CM | POA: Diagnosis not present

## 2019-04-09 DIAGNOSIS — R269 Unspecified abnormalities of gait and mobility: Secondary | ICD-10-CM | POA: Diagnosis not present

## 2019-04-09 DIAGNOSIS — I679 Cerebrovascular disease, unspecified: Secondary | ICD-10-CM | POA: Diagnosis not present

## 2019-04-09 DIAGNOSIS — E46 Unspecified protein-calorie malnutrition: Secondary | ICD-10-CM | POA: Diagnosis not present

## 2019-04-10 DIAGNOSIS — I129 Hypertensive chronic kidney disease with stage 1 through stage 4 chronic kidney disease, or unspecified chronic kidney disease: Secondary | ICD-10-CM | POA: Diagnosis not present

## 2019-04-10 DIAGNOSIS — M5137 Other intervertebral disc degeneration, lumbosacral region: Secondary | ICD-10-CM | POA: Diagnosis not present

## 2019-04-10 DIAGNOSIS — G4733 Obstructive sleep apnea (adult) (pediatric): Secondary | ICD-10-CM | POA: Diagnosis not present

## 2019-04-10 DIAGNOSIS — D631 Anemia in chronic kidney disease: Secondary | ICD-10-CM | POA: Diagnosis not present

## 2019-04-10 DIAGNOSIS — L97821 Non-pressure chronic ulcer of other part of left lower leg limited to breakdown of skin: Secondary | ICD-10-CM | POA: Diagnosis not present

## 2019-04-10 DIAGNOSIS — F329 Major depressive disorder, single episode, unspecified: Secondary | ICD-10-CM | POA: Diagnosis not present

## 2019-04-10 DIAGNOSIS — M5416 Radiculopathy, lumbar region: Secondary | ICD-10-CM | POA: Diagnosis not present

## 2019-04-10 DIAGNOSIS — M1612 Unilateral primary osteoarthritis, left hip: Secondary | ICD-10-CM | POA: Diagnosis not present

## 2019-04-10 DIAGNOSIS — N183 Chronic kidney disease, stage 3 (moderate): Secondary | ICD-10-CM | POA: Diagnosis not present

## 2019-04-10 DIAGNOSIS — E1151 Type 2 diabetes mellitus with diabetic peripheral angiopathy without gangrene: Secondary | ICD-10-CM | POA: Diagnosis not present

## 2019-04-10 DIAGNOSIS — I872 Venous insufficiency (chronic) (peripheral): Secondary | ICD-10-CM | POA: Diagnosis not present

## 2019-04-10 DIAGNOSIS — H9193 Unspecified hearing loss, bilateral: Secondary | ICD-10-CM | POA: Diagnosis not present

## 2019-04-10 DIAGNOSIS — F5104 Psychophysiologic insomnia: Secondary | ICD-10-CM | POA: Diagnosis not present

## 2019-04-10 DIAGNOSIS — E46 Unspecified protein-calorie malnutrition: Secondary | ICD-10-CM | POA: Diagnosis not present

## 2019-04-10 DIAGNOSIS — E1122 Type 2 diabetes mellitus with diabetic chronic kidney disease: Secondary | ICD-10-CM | POA: Diagnosis not present

## 2019-04-10 DIAGNOSIS — Z4800 Encounter for change or removal of nonsurgical wound dressing: Secondary | ICD-10-CM | POA: Diagnosis not present

## 2019-04-10 DIAGNOSIS — Z7984 Long term (current) use of oral hypoglycemic drugs: Secondary | ICD-10-CM | POA: Diagnosis not present

## 2019-04-10 DIAGNOSIS — E669 Obesity, unspecified: Secondary | ICD-10-CM | POA: Diagnosis not present

## 2019-04-10 DIAGNOSIS — E785 Hyperlipidemia, unspecified: Secondary | ICD-10-CM | POA: Diagnosis not present

## 2019-04-10 DIAGNOSIS — J449 Chronic obstructive pulmonary disease, unspecified: Secondary | ICD-10-CM | POA: Diagnosis not present

## 2019-04-10 DIAGNOSIS — H60501 Unspecified acute noninfective otitis externa, right ear: Secondary | ICD-10-CM | POA: Diagnosis not present

## 2019-04-10 DIAGNOSIS — I4892 Unspecified atrial flutter: Secondary | ICD-10-CM | POA: Diagnosis not present

## 2019-04-10 DIAGNOSIS — M109 Gout, unspecified: Secondary | ICD-10-CM | POA: Diagnosis not present

## 2019-04-10 DIAGNOSIS — M810 Age-related osteoporosis without current pathological fracture: Secondary | ICD-10-CM | POA: Diagnosis not present

## 2019-04-10 DIAGNOSIS — H6123 Impacted cerumen, bilateral: Secondary | ICD-10-CM | POA: Diagnosis not present

## 2019-04-12 DIAGNOSIS — E1151 Type 2 diabetes mellitus with diabetic peripheral angiopathy without gangrene: Secondary | ICD-10-CM | POA: Diagnosis not present

## 2019-04-12 DIAGNOSIS — I129 Hypertensive chronic kidney disease with stage 1 through stage 4 chronic kidney disease, or unspecified chronic kidney disease: Secondary | ICD-10-CM | POA: Diagnosis not present

## 2019-04-12 DIAGNOSIS — E1122 Type 2 diabetes mellitus with diabetic chronic kidney disease: Secondary | ICD-10-CM | POA: Diagnosis not present

## 2019-04-12 DIAGNOSIS — I872 Venous insufficiency (chronic) (peripheral): Secondary | ICD-10-CM | POA: Diagnosis not present

## 2019-04-12 DIAGNOSIS — L97821 Non-pressure chronic ulcer of other part of left lower leg limited to breakdown of skin: Secondary | ICD-10-CM | POA: Diagnosis not present

## 2019-04-12 DIAGNOSIS — Z4800 Encounter for change or removal of nonsurgical wound dressing: Secondary | ICD-10-CM | POA: Diagnosis not present

## 2019-04-15 DIAGNOSIS — I129 Hypertensive chronic kidney disease with stage 1 through stage 4 chronic kidney disease, or unspecified chronic kidney disease: Secondary | ICD-10-CM | POA: Diagnosis not present

## 2019-04-15 DIAGNOSIS — E1122 Type 2 diabetes mellitus with diabetic chronic kidney disease: Secondary | ICD-10-CM | POA: Diagnosis not present

## 2019-04-15 DIAGNOSIS — E1151 Type 2 diabetes mellitus with diabetic peripheral angiopathy without gangrene: Secondary | ICD-10-CM | POA: Diagnosis not present

## 2019-04-15 DIAGNOSIS — I872 Venous insufficiency (chronic) (peripheral): Secondary | ICD-10-CM | POA: Diagnosis not present

## 2019-04-15 DIAGNOSIS — Z4800 Encounter for change or removal of nonsurgical wound dressing: Secondary | ICD-10-CM | POA: Diagnosis not present

## 2019-04-15 DIAGNOSIS — L97821 Non-pressure chronic ulcer of other part of left lower leg limited to breakdown of skin: Secondary | ICD-10-CM | POA: Diagnosis not present

## 2019-04-19 DIAGNOSIS — I872 Venous insufficiency (chronic) (peripheral): Secondary | ICD-10-CM | POA: Diagnosis not present

## 2019-04-19 DIAGNOSIS — Z4800 Encounter for change or removal of nonsurgical wound dressing: Secondary | ICD-10-CM | POA: Diagnosis not present

## 2019-04-19 DIAGNOSIS — L97821 Non-pressure chronic ulcer of other part of left lower leg limited to breakdown of skin: Secondary | ICD-10-CM | POA: Diagnosis not present

## 2019-04-19 DIAGNOSIS — E1151 Type 2 diabetes mellitus with diabetic peripheral angiopathy without gangrene: Secondary | ICD-10-CM | POA: Diagnosis not present

## 2019-04-19 DIAGNOSIS — E1122 Type 2 diabetes mellitus with diabetic chronic kidney disease: Secondary | ICD-10-CM | POA: Diagnosis not present

## 2019-04-19 DIAGNOSIS — I129 Hypertensive chronic kidney disease with stage 1 through stage 4 chronic kidney disease, or unspecified chronic kidney disease: Secondary | ICD-10-CM | POA: Diagnosis not present

## 2019-04-22 DIAGNOSIS — I129 Hypertensive chronic kidney disease with stage 1 through stage 4 chronic kidney disease, or unspecified chronic kidney disease: Secondary | ICD-10-CM | POA: Diagnosis not present

## 2019-04-22 DIAGNOSIS — E1151 Type 2 diabetes mellitus with diabetic peripheral angiopathy without gangrene: Secondary | ICD-10-CM | POA: Diagnosis not present

## 2019-04-22 DIAGNOSIS — I872 Venous insufficiency (chronic) (peripheral): Secondary | ICD-10-CM | POA: Diagnosis not present

## 2019-04-22 DIAGNOSIS — Z4800 Encounter for change or removal of nonsurgical wound dressing: Secondary | ICD-10-CM | POA: Diagnosis not present

## 2019-04-22 DIAGNOSIS — L97821 Non-pressure chronic ulcer of other part of left lower leg limited to breakdown of skin: Secondary | ICD-10-CM | POA: Diagnosis not present

## 2019-04-22 DIAGNOSIS — E1122 Type 2 diabetes mellitus with diabetic chronic kidney disease: Secondary | ICD-10-CM | POA: Diagnosis not present

## 2019-04-26 DIAGNOSIS — I129 Hypertensive chronic kidney disease with stage 1 through stage 4 chronic kidney disease, or unspecified chronic kidney disease: Secondary | ICD-10-CM | POA: Diagnosis not present

## 2019-04-26 DIAGNOSIS — I872 Venous insufficiency (chronic) (peripheral): Secondary | ICD-10-CM | POA: Diagnosis not present

## 2019-04-26 DIAGNOSIS — Z4800 Encounter for change or removal of nonsurgical wound dressing: Secondary | ICD-10-CM | POA: Diagnosis not present

## 2019-04-26 DIAGNOSIS — E1122 Type 2 diabetes mellitus with diabetic chronic kidney disease: Secondary | ICD-10-CM | POA: Diagnosis not present

## 2019-04-26 DIAGNOSIS — L97821 Non-pressure chronic ulcer of other part of left lower leg limited to breakdown of skin: Secondary | ICD-10-CM | POA: Diagnosis not present

## 2019-04-26 DIAGNOSIS — E1151 Type 2 diabetes mellitus with diabetic peripheral angiopathy without gangrene: Secondary | ICD-10-CM | POA: Diagnosis not present

## 2019-04-29 DIAGNOSIS — L97821 Non-pressure chronic ulcer of other part of left lower leg limited to breakdown of skin: Secondary | ICD-10-CM | POA: Diagnosis not present

## 2019-04-29 DIAGNOSIS — E1151 Type 2 diabetes mellitus with diabetic peripheral angiopathy without gangrene: Secondary | ICD-10-CM | POA: Diagnosis not present

## 2019-04-29 DIAGNOSIS — E1122 Type 2 diabetes mellitus with diabetic chronic kidney disease: Secondary | ICD-10-CM | POA: Diagnosis not present

## 2019-04-29 DIAGNOSIS — I872 Venous insufficiency (chronic) (peripheral): Secondary | ICD-10-CM | POA: Diagnosis not present

## 2019-04-29 DIAGNOSIS — I129 Hypertensive chronic kidney disease with stage 1 through stage 4 chronic kidney disease, or unspecified chronic kidney disease: Secondary | ICD-10-CM | POA: Diagnosis not present

## 2019-04-29 DIAGNOSIS — Z4800 Encounter for change or removal of nonsurgical wound dressing: Secondary | ICD-10-CM | POA: Diagnosis not present

## 2019-05-03 DIAGNOSIS — I129 Hypertensive chronic kidney disease with stage 1 through stage 4 chronic kidney disease, or unspecified chronic kidney disease: Secondary | ICD-10-CM | POA: Diagnosis not present

## 2019-05-03 DIAGNOSIS — Z4800 Encounter for change or removal of nonsurgical wound dressing: Secondary | ICD-10-CM | POA: Diagnosis not present

## 2019-05-03 DIAGNOSIS — L97821 Non-pressure chronic ulcer of other part of left lower leg limited to breakdown of skin: Secondary | ICD-10-CM | POA: Diagnosis not present

## 2019-05-03 DIAGNOSIS — E1122 Type 2 diabetes mellitus with diabetic chronic kidney disease: Secondary | ICD-10-CM | POA: Diagnosis not present

## 2019-05-03 DIAGNOSIS — I872 Venous insufficiency (chronic) (peripheral): Secondary | ICD-10-CM | POA: Diagnosis not present

## 2019-05-03 DIAGNOSIS — E1151 Type 2 diabetes mellitus with diabetic peripheral angiopathy without gangrene: Secondary | ICD-10-CM | POA: Diagnosis not present

## 2019-05-05 ENCOUNTER — Other Ambulatory Visit: Payer: Self-pay

## 2019-05-05 ENCOUNTER — Ambulatory Visit (INDEPENDENT_AMBULATORY_CARE_PROVIDER_SITE_OTHER): Payer: Medicare Other | Admitting: *Deleted

## 2019-05-05 DIAGNOSIS — R001 Bradycardia, unspecified: Secondary | ICD-10-CM

## 2019-05-05 DIAGNOSIS — I5032 Chronic diastolic (congestive) heart failure: Secondary | ICD-10-CM

## 2019-05-06 DIAGNOSIS — E1122 Type 2 diabetes mellitus with diabetic chronic kidney disease: Secondary | ICD-10-CM | POA: Diagnosis not present

## 2019-05-06 DIAGNOSIS — I129 Hypertensive chronic kidney disease with stage 1 through stage 4 chronic kidney disease, or unspecified chronic kidney disease: Secondary | ICD-10-CM | POA: Diagnosis not present

## 2019-05-06 DIAGNOSIS — E1151 Type 2 diabetes mellitus with diabetic peripheral angiopathy without gangrene: Secondary | ICD-10-CM | POA: Diagnosis not present

## 2019-05-06 DIAGNOSIS — L97821 Non-pressure chronic ulcer of other part of left lower leg limited to breakdown of skin: Secondary | ICD-10-CM | POA: Diagnosis not present

## 2019-05-06 DIAGNOSIS — Z4800 Encounter for change or removal of nonsurgical wound dressing: Secondary | ICD-10-CM | POA: Diagnosis not present

## 2019-05-06 DIAGNOSIS — I872 Venous insufficiency (chronic) (peripheral): Secondary | ICD-10-CM | POA: Diagnosis not present

## 2019-05-06 LAB — CUP PACEART REMOTE DEVICE CHECK
Battery Remaining Longevity: 120 mo
Battery Remaining Percentage: 95 %
Battery Voltage: 3.02 V
Brady Statistic RV Percent Paced: 93 %
Date Time Interrogation Session: 20200514145127
Implantable Lead Implant Date: 20190717
Implantable Lead Location: 753860
Implantable Pulse Generator Implant Date: 20190717
Lead Channel Impedance Value: 540 Ohm
Lead Channel Sensing Intrinsic Amplitude: 7.7 mV
Lead Channel Setting Pacing Amplitude: 2.5 V
Lead Channel Setting Pacing Pulse Width: 0.5 ms
Lead Channel Setting Sensing Sensitivity: 2 mV
Pulse Gen Model: 1272
Pulse Gen Serial Number: 9002800

## 2019-05-10 ENCOUNTER — Other Ambulatory Visit: Payer: Self-pay

## 2019-05-10 ENCOUNTER — Encounter (INDEPENDENT_AMBULATORY_CARE_PROVIDER_SITE_OTHER): Payer: Medicare Other | Admitting: Ophthalmology

## 2019-05-10 DIAGNOSIS — H9193 Unspecified hearing loss, bilateral: Secondary | ICD-10-CM | POA: Diagnosis not present

## 2019-05-10 DIAGNOSIS — E11319 Type 2 diabetes mellitus with unspecified diabetic retinopathy without macular edema: Secondary | ICD-10-CM | POA: Diagnosis not present

## 2019-05-10 DIAGNOSIS — N183 Chronic kidney disease, stage 3 (moderate): Secondary | ICD-10-CM | POA: Diagnosis not present

## 2019-05-10 DIAGNOSIS — E669 Obesity, unspecified: Secondary | ICD-10-CM | POA: Diagnosis not present

## 2019-05-10 DIAGNOSIS — M1612 Unilateral primary osteoarthritis, left hip: Secondary | ICD-10-CM | POA: Diagnosis not present

## 2019-05-10 DIAGNOSIS — Z6821 Body mass index (BMI) 21.0-21.9, adult: Secondary | ICD-10-CM | POA: Diagnosis not present

## 2019-05-10 DIAGNOSIS — J449 Chronic obstructive pulmonary disease, unspecified: Secondary | ICD-10-CM | POA: Diagnosis not present

## 2019-05-10 DIAGNOSIS — G4733 Obstructive sleep apnea (adult) (pediatric): Secondary | ICD-10-CM | POA: Diagnosis not present

## 2019-05-10 DIAGNOSIS — D631 Anemia in chronic kidney disease: Secondary | ICD-10-CM | POA: Diagnosis not present

## 2019-05-10 DIAGNOSIS — Z4800 Encounter for change or removal of nonsurgical wound dressing: Secondary | ICD-10-CM | POA: Diagnosis not present

## 2019-05-10 DIAGNOSIS — F5104 Psychophysiologic insomnia: Secondary | ICD-10-CM | POA: Diagnosis not present

## 2019-05-10 DIAGNOSIS — H43813 Vitreous degeneration, bilateral: Secondary | ICD-10-CM

## 2019-05-10 DIAGNOSIS — I129 Hypertensive chronic kidney disease with stage 1 through stage 4 chronic kidney disease, or unspecified chronic kidney disease: Secondary | ICD-10-CM | POA: Diagnosis not present

## 2019-05-10 DIAGNOSIS — H60501 Unspecified acute noninfective otitis externa, right ear: Secondary | ICD-10-CM | POA: Diagnosis not present

## 2019-05-10 DIAGNOSIS — M5137 Other intervertebral disc degeneration, lumbosacral region: Secondary | ICD-10-CM | POA: Diagnosis not present

## 2019-05-10 DIAGNOSIS — L97821 Non-pressure chronic ulcer of other part of left lower leg limited to breakdown of skin: Secondary | ICD-10-CM | POA: Diagnosis not present

## 2019-05-10 DIAGNOSIS — I872 Venous insufficiency (chronic) (peripheral): Secondary | ICD-10-CM | POA: Diagnosis not present

## 2019-05-10 DIAGNOSIS — E1151 Type 2 diabetes mellitus with diabetic peripheral angiopathy without gangrene: Secondary | ICD-10-CM | POA: Diagnosis not present

## 2019-05-10 DIAGNOSIS — E1122 Type 2 diabetes mellitus with diabetic chronic kidney disease: Secondary | ICD-10-CM | POA: Diagnosis not present

## 2019-05-10 DIAGNOSIS — H35033 Hypertensive retinopathy, bilateral: Secondary | ICD-10-CM

## 2019-05-10 DIAGNOSIS — M109 Gout, unspecified: Secondary | ICD-10-CM | POA: Diagnosis not present

## 2019-05-10 DIAGNOSIS — I10 Essential (primary) hypertension: Secondary | ICD-10-CM

## 2019-05-10 DIAGNOSIS — I4892 Unspecified atrial flutter: Secondary | ICD-10-CM | POA: Diagnosis not present

## 2019-05-10 DIAGNOSIS — L97811 Non-pressure chronic ulcer of other part of right lower leg limited to breakdown of skin: Secondary | ICD-10-CM | POA: Diagnosis not present

## 2019-05-10 DIAGNOSIS — F329 Major depressive disorder, single episode, unspecified: Secondary | ICD-10-CM | POA: Diagnosis not present

## 2019-05-10 DIAGNOSIS — E113393 Type 2 diabetes mellitus with moderate nonproliferative diabetic retinopathy without macular edema, bilateral: Secondary | ICD-10-CM

## 2019-05-10 DIAGNOSIS — E785 Hyperlipidemia, unspecified: Secondary | ICD-10-CM | POA: Diagnosis not present

## 2019-05-10 DIAGNOSIS — M5416 Radiculopathy, lumbar region: Secondary | ICD-10-CM | POA: Diagnosis not present

## 2019-05-10 DIAGNOSIS — M810 Age-related osteoporosis without current pathological fracture: Secondary | ICD-10-CM | POA: Diagnosis not present

## 2019-05-10 DIAGNOSIS — H6123 Impacted cerumen, bilateral: Secondary | ICD-10-CM | POA: Diagnosis not present

## 2019-05-11 DIAGNOSIS — L97811 Non-pressure chronic ulcer of other part of right lower leg limited to breakdown of skin: Secondary | ICD-10-CM | POA: Diagnosis not present

## 2019-05-11 DIAGNOSIS — Z4800 Encounter for change or removal of nonsurgical wound dressing: Secondary | ICD-10-CM | POA: Diagnosis not present

## 2019-05-11 DIAGNOSIS — L97821 Non-pressure chronic ulcer of other part of left lower leg limited to breakdown of skin: Secondary | ICD-10-CM | POA: Diagnosis not present

## 2019-05-11 DIAGNOSIS — E1151 Type 2 diabetes mellitus with diabetic peripheral angiopathy without gangrene: Secondary | ICD-10-CM | POA: Diagnosis not present

## 2019-05-11 DIAGNOSIS — I129 Hypertensive chronic kidney disease with stage 1 through stage 4 chronic kidney disease, or unspecified chronic kidney disease: Secondary | ICD-10-CM | POA: Diagnosis not present

## 2019-05-11 DIAGNOSIS — I872 Venous insufficiency (chronic) (peripheral): Secondary | ICD-10-CM | POA: Diagnosis not present

## 2019-05-13 DIAGNOSIS — L97821 Non-pressure chronic ulcer of other part of left lower leg limited to breakdown of skin: Secondary | ICD-10-CM | POA: Diagnosis not present

## 2019-05-13 DIAGNOSIS — E1151 Type 2 diabetes mellitus with diabetic peripheral angiopathy without gangrene: Secondary | ICD-10-CM | POA: Diagnosis not present

## 2019-05-13 DIAGNOSIS — L97811 Non-pressure chronic ulcer of other part of right lower leg limited to breakdown of skin: Secondary | ICD-10-CM | POA: Diagnosis not present

## 2019-05-13 DIAGNOSIS — I129 Hypertensive chronic kidney disease with stage 1 through stage 4 chronic kidney disease, or unspecified chronic kidney disease: Secondary | ICD-10-CM | POA: Diagnosis not present

## 2019-05-13 DIAGNOSIS — Z4800 Encounter for change or removal of nonsurgical wound dressing: Secondary | ICD-10-CM | POA: Diagnosis not present

## 2019-05-13 DIAGNOSIS — I872 Venous insufficiency (chronic) (peripheral): Secondary | ICD-10-CM | POA: Diagnosis not present

## 2019-05-17 DIAGNOSIS — L97821 Non-pressure chronic ulcer of other part of left lower leg limited to breakdown of skin: Secondary | ICD-10-CM | POA: Diagnosis not present

## 2019-05-17 DIAGNOSIS — Z4800 Encounter for change or removal of nonsurgical wound dressing: Secondary | ICD-10-CM | POA: Diagnosis not present

## 2019-05-17 DIAGNOSIS — I872 Venous insufficiency (chronic) (peripheral): Secondary | ICD-10-CM | POA: Diagnosis not present

## 2019-05-17 DIAGNOSIS — I129 Hypertensive chronic kidney disease with stage 1 through stage 4 chronic kidney disease, or unspecified chronic kidney disease: Secondary | ICD-10-CM | POA: Diagnosis not present

## 2019-05-17 DIAGNOSIS — E1151 Type 2 diabetes mellitus with diabetic peripheral angiopathy without gangrene: Secondary | ICD-10-CM | POA: Diagnosis not present

## 2019-05-17 DIAGNOSIS — L97811 Non-pressure chronic ulcer of other part of right lower leg limited to breakdown of skin: Secondary | ICD-10-CM | POA: Diagnosis not present

## 2019-05-20 DIAGNOSIS — E1151 Type 2 diabetes mellitus with diabetic peripheral angiopathy without gangrene: Secondary | ICD-10-CM | POA: Diagnosis not present

## 2019-05-20 DIAGNOSIS — L97811 Non-pressure chronic ulcer of other part of right lower leg limited to breakdown of skin: Secondary | ICD-10-CM | POA: Diagnosis not present

## 2019-05-20 DIAGNOSIS — L97821 Non-pressure chronic ulcer of other part of left lower leg limited to breakdown of skin: Secondary | ICD-10-CM | POA: Diagnosis not present

## 2019-05-20 DIAGNOSIS — I872 Venous insufficiency (chronic) (peripheral): Secondary | ICD-10-CM | POA: Diagnosis not present

## 2019-05-20 DIAGNOSIS — I129 Hypertensive chronic kidney disease with stage 1 through stage 4 chronic kidney disease, or unspecified chronic kidney disease: Secondary | ICD-10-CM | POA: Diagnosis not present

## 2019-05-20 DIAGNOSIS — Z4800 Encounter for change or removal of nonsurgical wound dressing: Secondary | ICD-10-CM | POA: Diagnosis not present

## 2019-05-24 DIAGNOSIS — L97821 Non-pressure chronic ulcer of other part of left lower leg limited to breakdown of skin: Secondary | ICD-10-CM | POA: Diagnosis not present

## 2019-05-24 DIAGNOSIS — I129 Hypertensive chronic kidney disease with stage 1 through stage 4 chronic kidney disease, or unspecified chronic kidney disease: Secondary | ICD-10-CM | POA: Diagnosis not present

## 2019-05-24 DIAGNOSIS — I872 Venous insufficiency (chronic) (peripheral): Secondary | ICD-10-CM | POA: Diagnosis not present

## 2019-05-24 DIAGNOSIS — L97811 Non-pressure chronic ulcer of other part of right lower leg limited to breakdown of skin: Secondary | ICD-10-CM | POA: Diagnosis not present

## 2019-05-24 DIAGNOSIS — Z4800 Encounter for change or removal of nonsurgical wound dressing: Secondary | ICD-10-CM | POA: Diagnosis not present

## 2019-05-24 DIAGNOSIS — E1151 Type 2 diabetes mellitus with diabetic peripheral angiopathy without gangrene: Secondary | ICD-10-CM | POA: Diagnosis not present

## 2019-05-25 NOTE — Progress Notes (Signed)
Remote pacemaker transmission.   

## 2019-05-26 ENCOUNTER — Telehealth: Payer: Self-pay | Admitting: Cardiovascular Disease

## 2019-05-26 NOTE — Telephone Encounter (Signed)
Phone visit/no my chart/pre reg complete/consent obtained/call [phone] --ttf

## 2019-05-27 DIAGNOSIS — L97811 Non-pressure chronic ulcer of other part of right lower leg limited to breakdown of skin: Secondary | ICD-10-CM | POA: Diagnosis not present

## 2019-05-27 DIAGNOSIS — E1151 Type 2 diabetes mellitus with diabetic peripheral angiopathy without gangrene: Secondary | ICD-10-CM | POA: Diagnosis not present

## 2019-05-27 DIAGNOSIS — I872 Venous insufficiency (chronic) (peripheral): Secondary | ICD-10-CM | POA: Diagnosis not present

## 2019-05-27 DIAGNOSIS — L97821 Non-pressure chronic ulcer of other part of left lower leg limited to breakdown of skin: Secondary | ICD-10-CM | POA: Diagnosis not present

## 2019-05-27 DIAGNOSIS — I129 Hypertensive chronic kidney disease with stage 1 through stage 4 chronic kidney disease, or unspecified chronic kidney disease: Secondary | ICD-10-CM | POA: Diagnosis not present

## 2019-05-27 DIAGNOSIS — Z4800 Encounter for change or removal of nonsurgical wound dressing: Secondary | ICD-10-CM | POA: Diagnosis not present

## 2019-05-31 DIAGNOSIS — E1151 Type 2 diabetes mellitus with diabetic peripheral angiopathy without gangrene: Secondary | ICD-10-CM | POA: Diagnosis not present

## 2019-05-31 DIAGNOSIS — L97821 Non-pressure chronic ulcer of other part of left lower leg limited to breakdown of skin: Secondary | ICD-10-CM | POA: Diagnosis not present

## 2019-05-31 DIAGNOSIS — I872 Venous insufficiency (chronic) (peripheral): Secondary | ICD-10-CM | POA: Diagnosis not present

## 2019-05-31 DIAGNOSIS — L97811 Non-pressure chronic ulcer of other part of right lower leg limited to breakdown of skin: Secondary | ICD-10-CM | POA: Diagnosis not present

## 2019-05-31 DIAGNOSIS — I129 Hypertensive chronic kidney disease with stage 1 through stage 4 chronic kidney disease, or unspecified chronic kidney disease: Secondary | ICD-10-CM | POA: Diagnosis not present

## 2019-05-31 DIAGNOSIS — Z4800 Encounter for change or removal of nonsurgical wound dressing: Secondary | ICD-10-CM | POA: Diagnosis not present

## 2019-06-01 NOTE — Telephone Encounter (Signed)

## 2019-06-02 ENCOUNTER — Telehealth (INDEPENDENT_AMBULATORY_CARE_PROVIDER_SITE_OTHER): Payer: Medicare Other | Admitting: Cardiovascular Disease

## 2019-06-02 ENCOUNTER — Telehealth: Payer: Self-pay | Admitting: *Deleted

## 2019-06-02 ENCOUNTER — Encounter: Payer: Self-pay | Admitting: Cardiovascular Disease

## 2019-06-02 VITALS — Ht 65.0 in | Wt 132.0 lb

## 2019-06-02 DIAGNOSIS — Z7901 Long term (current) use of anticoagulants: Secondary | ICD-10-CM

## 2019-06-02 DIAGNOSIS — I739 Peripheral vascular disease, unspecified: Secondary | ICD-10-CM

## 2019-06-02 DIAGNOSIS — W19XXXA Unspecified fall, initial encounter: Secondary | ICD-10-CM

## 2019-06-02 DIAGNOSIS — S0990XS Unspecified injury of head, sequela: Secondary | ICD-10-CM

## 2019-06-02 DIAGNOSIS — E119 Type 2 diabetes mellitus without complications: Secondary | ICD-10-CM | POA: Diagnosis not present

## 2019-06-02 DIAGNOSIS — I6521 Occlusion and stenosis of right carotid artery: Secondary | ICD-10-CM

## 2019-06-02 DIAGNOSIS — Z950 Presence of cardiac pacemaker: Secondary | ICD-10-CM | POA: Diagnosis not present

## 2019-06-02 DIAGNOSIS — I4891 Unspecified atrial fibrillation: Secondary | ICD-10-CM | POA: Diagnosis not present

## 2019-06-02 DIAGNOSIS — E785 Hyperlipidemia, unspecified: Secondary | ICD-10-CM | POA: Diagnosis not present

## 2019-06-02 DIAGNOSIS — I10 Essential (primary) hypertension: Secondary | ICD-10-CM

## 2019-06-02 DIAGNOSIS — R410 Disorientation, unspecified: Secondary | ICD-10-CM | POA: Diagnosis not present

## 2019-06-02 DIAGNOSIS — I5032 Chronic diastolic (congestive) heart failure: Secondary | ICD-10-CM

## 2019-06-02 NOTE — Telephone Encounter (Signed)

## 2019-06-02 NOTE — Telephone Encounter (Signed)
The patient and the daughter have been called to go over instructions from the virtual visit today.  They have been advised that a Head CT has been ordered. They will be called to set up an appointment. They have also both been advised to hold the Eliquis until the results have come back from the CT.  She will come in this week or next week for lab work  They have been advised to call back with any questions.

## 2019-06-02 NOTE — Progress Notes (Signed)
Virtual Visit via Telephone Note   This visit type was conducted due to national recommendations for restrictions regarding the COVID-19 Pandemic (e.g. social distancing) in an effort to limit this patient's exposure and mitigate transmission in our community.  Due to her co-morbid illnesses, this patient is at least at moderate risk for complications without adequate follow up.  This format is felt to be most appropriate for this patient at this time.  The patient did not have access to video technology/had technical difficulties with video requiring transitioning to audio format only (telephone).  [name] in this document were discussed and addressed.  No physical exam could be performed with this format.  Please refer to the patient's chart for her  consent to telehealth for [facility].   Date:  06/02/2019   ID:  Donna Owens 1928-04-15, MRN [id number]  Patient Location: Home Provider Location: Home  PCP:  Leanna Battles, MD  Cardiologist:  Sanda Klein, MD  Electrophysiologist:  None   Evaluation Performed:  Follow-Up Visit  Chief Complaint:  Headache, recent fall  History of Present Illness:    Donna Owens is a 85 y.o. female with long-term persistent atrial fibrillation with slow ventricular response, single-chamber permanent pacemaker ([facility] MRI, 2019), chronic diastolic heart failure, moderate carotid stenosis (followed by Dr. early), hypertension, hyperlipidemia, diabetes mellitus type 2 despite being lean, severe neuropathy with unsteady gait and frequent falls.  I spoke today at length with Donna Owens as well as with her daughter, [name].  The patient complains frequently hearing her heartbeat in her ears, especially when she is active.  She denies dyspnea, orthopnea, PND, leg edema or chest discomfort.  She does feel that her chest is tender to touch and wonders if she is pulled a muscle.  She reports falling on May 9.  She fell backwards and  "missed the chair" but did bump her head against the floor.  She had to be helped back up on her feet, but did not seek medical attention.  She reports having mild headaches persistently since then.  She is on chronic anticoagulation.  She sounds a little more agitated on the phone today than I am usually accustomed to hearing and she is stuttering frequently.  She could not find the blood pressure cuff, because "[name]".  On the other hand, she seems to be oriented.  As we go over her medications, she seems to understand what is she is supposed to take.  For example she knows that she only needs to take her diuretic, furosemide 3 days a week.  She reports gaining weight, but thinks is because she is eating better.  Her daughter reports that her mother's behavior has gradually become more erratic.  She is hiding things and then complains of losing them.  She cannot find her medications, but her daughter will find them stashed away somewhere.  She is trying to get her medications in blister packs, but has not succeeded in doing that yet.  She thinks that this may be due to the patient's advancing age, but does think that things have gotten worse over the last month.  She also reports that she has noticed dyspnea on exertion.  Sounds like functional class II.  When they go to the store together, her mother has to stop and rest to catch her breath.  She has not noticed edema.  The patient does not have symptoms concerning for COVID-19 infection (fever, chills,  cough, or new shortness of breath).    Past Medical History:  Diagnosis Date  . Anemia   . Anxiety   . Arthritis    "back and right knee" (07/08/2018)  . Atrophic vaginitis   . Breast cancer, right breast ([place]) 2001  . Carotid arterial disease ([place]) 12/13/11    mild by doppler  . Chronic lower back pain   . CVA (cerebral vascular accident) [facility])    "dr never told me I had one" (07/08/2018)  . Depression   . Hyperlipemia   .  Hypertension   . Migraine    "1-2/year" (07/08/2018)  . OSA (obstructive sleep apnea)   . OSA on CPAP 06/20/2014  . Osteoporosis   . Presence of permanent cardiac pacemaker 07/08/2018  . Pulmonary hypertension ([place])    moderate echo 11/07/10  . PVD (peripheral vascular disease) ([place])   . Sleep apnea   . Type II diabetes mellitus ([place])    Past Surgical History:  Procedure Laterality Date  . BREAST LUMPECTOMY Right 07/26/2000   chemo/radiation  . BREAST LUMPECTOMY Left    benign  . CARDIOVERSION N/A 07/21/2014   Procedure: CARDIOVERSION;  Surgeon: Sanda Klein, MD;  Location: [facility];  Service: Cardiovascular;  Laterality: N/A;  . CATARACT EXTRACTION W/ INTRAOCULAR LENS  IMPLANT, BILATERAL Bilateral    "not sure about left eye; ?lens"  . COLONOSCOPY  2010  . INSERT / REPLACE / REMOVE PACEMAKER  07/08/2018  . PACEMAKER IMPLANT N/A 07/08/2018   Procedure: PACEMAKER IMPLANT;  Surgeon: Sanda Klein, MD;  Location: [place] CV LAB;  Service: Cardiovascular;  Laterality: N/A;  . TOENAIL EXCISION  03/2013   ingrown nail     Current Meds  Medication Sig  . Calcium Carbonate-Vitamin D (CALCIUM-VITAMIN D3 PO) Take 1 tablet by mouth daily.  . Cholecalciferol (VITAMIN D-3) 5000 units TABS Take 5,000 Units by mouth daily.   . Coenzyme Q10 (CO Q 10) 100 MG CAPS Take 100 mg by mouth daily.   . Cyanocobalamin (VITAMIN B-12) 2500 MCG SUBL Place 2,500 mcg under the tongue daily.  Marland Kitchen doxepin (SINEQUAN) 10 MG capsule TK 1 C PO HS FOR SLP  . doxycycline (VIBRA-TABS) 100 MG tablet   . ELIQUIS 2.5 MG TABS tablet TAKE 1 TABLET(2.5 MG) BY MOUTH TWICE DAILY  . ferrous sulfate 325 (65 FE) MG tablet Take 325 mg by mouth daily.   . furosemide (LASIX) 20 MG tablet Take 20 mg by mouth every Monday, Wednesday, and Friday.  . gabapentin (NEURONTIN) 300 MG capsule Take 1 capsule (300 mg total) by mouth at bedtime. (Patient taking differently: Take 100 mg by mouth at bedtime. )  . metFORMIN  (GLUCOPHAGE) 500 MG tablet TAKE 1 TABLET BY MOUTH DAILY  . MYRBETRIQ 50 MG TB24 tablet TK 1 T PO QD  . olmesartan-hydrochlorothiazide (BENICAR HCT) 20-12.5 MG tablet   . omega-3 acid ethyl esters (LOVAZA) 1 g capsule Take 1 g by mouth daily.  Vladimir Faster Glycol-Propyl Glycol (SYSTANE) 0.4-0.3 % SOLN Place 2 drops into both eyes every 8 (eight) hours as needed (for dry eyes).   . pravastatin (PRAVACHOL) 40 MG tablet Take 40 mg by mouth daily.     Allergies:   Patient has no known allergies.   Social History   Tobacco Use  . Smoking status: Never Smoker  . Smokeless tobacco: Never Used  Substance Use Topics  . Alcohol use: Never    Frequency: Never  . Drug use: Never     Family  Hx: The patient's family history includes Diabetes in her brother and another family member; Heart failure (age of onset: 36) in her daughter; Hypertension in her brother.  ROS:   Please see the history of present illness.    Continues to have problems with unsteady gait and poor sensation in her feet, but denies visual disturbances, focal weakness, trouble speaking or other focal neurological complaints. Owens other systems reviewed and are negative.   Prior CV studies:   The following studies were reviewed today:  Comprehensive pacemaker download from May 13 shows normal device function.  Estimated generator longevity 10 years.  She has 93% ventricular pacing due to slow ventricular response despite not taking any AV nodal blockers.  Presenting electrogram shows ventricular paced rhythm.  Lead parameters are normal.  There are no episodes of high ventricular rate.  Labs/Other Tests and Data Reviewed:    EKG:  An ECG dated 10/29/2018 was personally reviewed today and demonstrated:  Atrial fibrillation with ventricular paced rhythm  Recent Labs: 07/06/2018: Hemoglobin 12.0; Platelets 177 11/26/2018: ALT 14; BUN 30; Creatinine, Ser 1.35; Potassium 4.8; Sodium 140   Recent Lipid Panel Lab Results   Component Value Date/Time   CHOL 152 11/26/2018 10:04 AM   TRIG 38 11/26/2018 10:04 AM   HDL 87 11/26/2018 10:04 AM   CHOLHDL 1.7 11/26/2018 10:04 AM   CHOLHDL 2.6 02/02/2014 11:06 AM   LDLCALC 57 11/26/2018 10:04 AM    Wt Readings from Last 3 Encounters:  06/02/19 132 lb (59.9 kg)  11/26/18 129 lb 9.6 oz (58.8 kg)  10/28/18 131 lb (59.4 kg)     Objective:    Vital Signs:  Ht 5\' 5"  (1.651 m)   Wt 132 lb (59.9 kg)   BMI 21.97 kg/m  She cannot find her blood pressure cuff.  VITAL SIGNS:  reviewed Unable to examine  ASSESSMENT & PLAN:    1. Head injury: I am concerned about the possibility of subdural hematoma since she is taking oral anticoagulants.  Will ask her to stop taking the Eliquis and schedule her for a head CT.  The fall happened a month ago.  Symptoms may also be attributable to progressive dementia, if the scan is negative. 2. AFib: Permanent arrhythmia with slow ventricular response.  Even though she does not take any AV nodal blocking agents she has 93% ventricular pacing.  Embolic risk is high, but will hold anticoagulation until we clarify if there is any intracranial hemorrhage. CHADSVasc 7 (age 43, gender, HTN, DM, HF, PAD). 3. CHF: Appears well compensated, NYHA functional class II without overt hypervolemia (although I cannot perform the exam myself).  We reduced her dose of diuretic to avoid worsening renal function. 4. CKD 3: Would like to recheck renal function, especially since it is unclear whether she is taking her medicines properly.  Baseline creatinine seems to be 1.3-1.4. 5. PAD: Denies claudication, has a history of mostly infrapopliteal disease by previous ultrasound 2017. 6. DM: Good control per her report.  On metformin monotherapy.  Hypoglycemia would be an unlikely reason for her cognitive issues. 7. HLP: Parameters were good when last checked. 8. HTN: Unable to check today. 9. Cognitive dysfunction: Could be simply age-related, but need to exclude  intracranial hemorrhage.  He probably refuses any suggestion of moving out of her house to a nursing facility, but is willing to allow her daughter to move in with her.   COVID-19 Education: The signs and symptoms of COVID-19 were discussed with the patient and  how to seek care for testing (follow up with PCP or arrange E-visit).  The importance of social distancing was discussed today.  Time:   Today, I have spent 29 minutes with the patient with telehealth technology discussing the above problems.     Medication Adjustments/Labs and Tests Ordered: Current medicines are reviewed at length with the patient today.  Concerns regarding medicines are outlined above.   Tests Ordered: No orders of the defined types were placed in this encounter.   Medication Changes: No orders of the defined types were placed in this encounter.  Patient Instructions  Medication Instructions:  Your physician recommends that you continue on your current medications as directed. Please refer to the Current Medication list given to you today.  If you need a refill on your cardiac medications before your next appointment, please call your pharmacy.   Lab work: Your provider would like for you to return this week to have the following labs drawn: CBC, CMET and fasting Lipid. You do not need an appointment for the lab. Once in our office lobby there is a podium where you can sign in and ring the doorbell to alert [place] that you are here. The lab is open from 8:00 am to 4:30 pm; closed for lunch from 12:45pm-1:45pm.   Testing/Procedures: Your provider has ordered a Head CT without contrast. Someone will be calling you to set this appointment up. Non-Cardiac CT scanning, (CAT scanning), is a noninvasive, special x-ray that produces cross-sectional images of the body using x-rays and a computer. CT scans help physicians diagnose and treat medical conditions. CT scans provide greater clarity and reveal more details than  regular x-ray exams.   Follow-Up: At [facility], you and your health needs are our priority.  As part of our continuing mission to provide you with exceptional heart care, we have created designated Provider Care Teams.  These Care Teams include your primary Cardiologist (physician) and Advanced Practice Providers (APPs -  Physician Assistants and Nurse Practitioners) who Owens work together to provide you with the care you need, when you need it. You will need a follow up appointment in 6 months.  Please call our office 2 months in advance to schedule this appointment.  You may see Sanda Klein, MD or one of the following Advanced Practice Providers on your designated Care Team: Almyra Deforest, [place] Fabian Sharp, [place]         Disposition:  Follow up 6 months.  We will call her with the results of the scans.  Signed, Sanda Klein, MD  06/02/2019 9:13 AM    [facility]

## 2019-06-02 NOTE — Patient Instructions (Addendum)
Medication Instructions:  Please hold the Eliquis until the results of the Head CT have come back.  If you need a refill on your cardiac medications before your next appointment, please call your pharmacy.   Lab work: Your provider would like for you to return this week to have the following labs drawn: CBC, CMET, TSH, and fasting Lipid. You do not need an appointment for the lab. Once in our office lobby there is a podium where you can sign in and ring the doorbell to alert [place] that you are here. The lab is open from 8:00 am to 4:30 pm; closed for lunch from 12:45pm-1:45pm.   Testing/Procedures: Your provider has ordered a Head CT without contrast. Someone will be calling you to set this appointment up. Non-Cardiac CT scanning, (CAT scanning), is a noninvasive, special x-ray that produces cross-sectional images of the body using x-rays and a computer. CT scans help physicians diagnose and treat medical conditions. CT scans provide greater clarity and reveal more details than regular x-ray exams.   Follow-Up: At [facility], you and your health needs are our priority.  As part of our continuing mission to provide you with exceptional heart care, we have created designated Provider Care Teams.  These Care Teams include your primary Cardiologist (physician) and Advanced Practice Providers (APPs -  Physician Assistants and Nurse Practitioners) who all work together to provide you with the care you need, when you need it. You will need a follow up appointment in 6 months.  Please call our office 2 months in advance to schedule this appointment.  You may see Sanda Klein, MD or one of the following Advanced Practice Providers on your designated Care Team: Almyra Deforest, PA-C Fabian Sharp, [place]

## 2019-06-03 DIAGNOSIS — E1151 Type 2 diabetes mellitus with diabetic peripheral angiopathy without gangrene: Secondary | ICD-10-CM | POA: Diagnosis not present

## 2019-06-03 DIAGNOSIS — L97821 Non-pressure chronic ulcer of other part of left lower leg limited to breakdown of skin: Secondary | ICD-10-CM | POA: Diagnosis not present

## 2019-06-03 DIAGNOSIS — Z4800 Encounter for change or removal of nonsurgical wound dressing: Secondary | ICD-10-CM | POA: Diagnosis not present

## 2019-06-03 DIAGNOSIS — L97811 Non-pressure chronic ulcer of other part of right lower leg limited to breakdown of skin: Secondary | ICD-10-CM | POA: Diagnosis not present

## 2019-06-03 DIAGNOSIS — I872 Venous insufficiency (chronic) (peripheral): Secondary | ICD-10-CM | POA: Diagnosis not present

## 2019-06-03 DIAGNOSIS — I129 Hypertensive chronic kidney disease with stage 1 through stage 4 chronic kidney disease, or unspecified chronic kidney disease: Secondary | ICD-10-CM | POA: Diagnosis not present

## 2019-06-04 ENCOUNTER — Ambulatory Visit (INDEPENDENT_AMBULATORY_CARE_PROVIDER_SITE_OTHER)
Admission: RE | Admit: 2019-06-04 | Discharge: 2019-06-04 | Disposition: A | Discharge status: Home / Self Care | Payer: Medicare Other | Source: Ambulatory Visit | Attending: Cardiovascular Disease | Admitting: Cardiovascular Disease

## 2019-06-04 ENCOUNTER — Other Ambulatory Visit: Payer: Self-pay | Admitting: *Deleted

## 2019-06-04 ENCOUNTER — Other Ambulatory Visit: Payer: Self-pay

## 2019-06-04 DIAGNOSIS — S0990XS Unspecified injury of head, sequela: Secondary | ICD-10-CM | POA: Diagnosis not present

## 2019-06-04 DIAGNOSIS — I739 Peripheral vascular disease, unspecified: Secondary | ICD-10-CM | POA: Diagnosis not present

## 2019-06-04 DIAGNOSIS — I5032 Chronic diastolic (congestive) heart failure: Secondary | ICD-10-CM | POA: Diagnosis not present

## 2019-06-04 DIAGNOSIS — I6521 Occlusion and stenosis of right carotid artery: Secondary | ICD-10-CM | POA: Diagnosis not present

## 2019-06-04 DIAGNOSIS — S0990XA Unspecified injury of head, initial encounter: Secondary | ICD-10-CM | POA: Diagnosis not present

## 2019-06-04 DIAGNOSIS — E119 Type 2 diabetes mellitus without complications: Secondary | ICD-10-CM

## 2019-06-04 DIAGNOSIS — I4891 Unspecified atrial fibrillation: Secondary | ICD-10-CM | POA: Diagnosis not present

## 2019-06-04 DIAGNOSIS — I10 Essential (primary) hypertension: Secondary | ICD-10-CM | POA: Diagnosis not present

## 2019-06-04 LAB — LIPID PANEL
Chol/HDL Ratio: 2.2 ratio (ref 0.0–4.4)
Cholesterol, Total: 163 mg/dL (ref 100–199)
HDL: 74 mg/dL (ref >39)
LDL Calculated: 80 mg/dL (ref 0–99)
Triglycerides: 46 mg/dL (ref 0–149)
VLDL Cholesterol Cal: 9 mg/dL (ref 5–40)

## 2019-06-04 LAB — CBC
Hematocrit: 36.7 % (ref 34.0–46.6)
Hemoglobin: 12.1 g/dL (ref 11.1–15.9)
MCH: 29.4 pg (ref 26.6–33.0)
MCHC: 33 g/dL (ref 31.5–35.7)
MCV: 89 fL (ref 79–97)
Platelets: 146 10*3/uL — ABNORMAL LOW (ref 150–450)
RBC: 4.11 x10E6/uL (ref 3.77–5.28)
RDW: 14 % (ref 11.7–15.4)
WBC: 5.3 10*3/uL (ref 3.4–10.8)

## 2019-06-04 LAB — COMPREHENSIVE METABOLIC PANEL
ALT: 9 IU/L (ref 0–32)
AST: 20 IU/L (ref 0–40)
Albumin/Globulin Ratio: 1.3 (ref 1.2–2.2)
Albumin: 4.3 g/dL (ref 3.6–4.6)
Alkaline Phosphatase: 64 IU/L (ref 39–117)
BUN/Creatinine Ratio: 21 (ref 12–28)
BUN: 32 mg/dL — ABNORMAL HIGH (ref 8–27)
Bilirubin Total: 0.6 mg/dL (ref 0.0–1.2)
CO2: 24 mmol/L (ref 20–29)
Calcium: 10.2 mg/dL (ref 8.7–10.3)
Chloride: 101 mmol/L (ref 96–106)
Creatinine, Ser: 1.55 mg/dL — ABNORMAL HIGH (ref 0.57–1.00)
GFR calc Af Amer: 34 mL/min/{1.73_m2} — ABNORMAL LOW (ref >59)
GFR calc non Af Amer: 29 mL/min/{1.73_m2} — ABNORMAL LOW (ref >59)
Globulin, Total: 3.2 g/dL (ref 1.5–4.5)
Glucose: 88 mg/dL (ref 65–99)
Potassium: 4.4 mmol/L (ref 3.5–5.2)
Sodium: 139 mmol/L (ref 134–144)
Total Protein: 7.5 g/dL (ref 6.0–8.5)

## 2019-06-04 LAB — TSH: TSH: 1.63 u[IU]/mL (ref 0.450–4.500)

## 2019-06-07 ENCOUNTER — Telehealth: Payer: Self-pay | Admitting: *Deleted

## 2019-06-07 DIAGNOSIS — L97811 Non-pressure chronic ulcer of other part of right lower leg limited to breakdown of skin: Secondary | ICD-10-CM | POA: Diagnosis not present

## 2019-06-07 DIAGNOSIS — L97821 Non-pressure chronic ulcer of other part of left lower leg limited to breakdown of skin: Secondary | ICD-10-CM | POA: Diagnosis not present

## 2019-06-07 DIAGNOSIS — I872 Venous insufficiency (chronic) (peripheral): Secondary | ICD-10-CM | POA: Diagnosis not present

## 2019-06-07 DIAGNOSIS — E1151 Type 2 diabetes mellitus with diabetic peripheral angiopathy without gangrene: Secondary | ICD-10-CM | POA: Diagnosis not present

## 2019-06-07 DIAGNOSIS — I129 Hypertensive chronic kidney disease with stage 1 through stage 4 chronic kidney disease, or unspecified chronic kidney disease: Secondary | ICD-10-CM | POA: Diagnosis not present

## 2019-06-07 DIAGNOSIS — Z4800 Encounter for change or removal of nonsurgical wound dressing: Secondary | ICD-10-CM | POA: Diagnosis not present

## 2019-06-07 MED ORDER — FUROSEMIDE 20 MG PO TABS: 10.0000 mg | ORAL_TABLET | ORAL | Status: DC

## 2019-06-07 NOTE — Telephone Encounter (Signed)
Patient made aware of results and verbalized understanding.  She will restart her Eliquis 2.5 mg bid and reduce her Furosemide to 10 mg M-W-F (half a tablet). She has been advised to call back if anything further is needed.   Head CT: Normal head CT. No sign of bleeding after fall.  Can restart apiaban 2.5 mg twice daily.

## 2019-06-07 NOTE — Telephone Encounter (Signed)
-----   Message from Sanda Klein, MD sent at 06/05/2019 10:38 AM EDT ----- LDL cholesterol is a little high, but will continue same meds Kidney tests a little worse. Let's try to do furosemide only 10 mg (half a tablet) three days weekly. Thyroid normal

## 2019-06-09 DIAGNOSIS — J449 Chronic obstructive pulmonary disease, unspecified: Secondary | ICD-10-CM | POA: Diagnosis not present

## 2019-06-09 DIAGNOSIS — M1612 Unilateral primary osteoarthritis, left hip: Secondary | ICD-10-CM | POA: Diagnosis not present

## 2019-06-09 DIAGNOSIS — F329 Major depressive disorder, single episode, unspecified: Secondary | ICD-10-CM | POA: Diagnosis not present

## 2019-06-09 DIAGNOSIS — G4733 Obstructive sleep apnea (adult) (pediatric): Secondary | ICD-10-CM | POA: Diagnosis not present

## 2019-06-09 DIAGNOSIS — H60501 Unspecified acute noninfective otitis externa, right ear: Secondary | ICD-10-CM | POA: Diagnosis not present

## 2019-06-09 DIAGNOSIS — D631 Anemia in chronic kidney disease: Secondary | ICD-10-CM | POA: Diagnosis not present

## 2019-06-09 DIAGNOSIS — I4892 Unspecified atrial flutter: Secondary | ICD-10-CM | POA: Diagnosis not present

## 2019-06-09 DIAGNOSIS — H9193 Unspecified hearing loss, bilateral: Secondary | ICD-10-CM | POA: Diagnosis not present

## 2019-06-09 DIAGNOSIS — Z4800 Encounter for change or removal of nonsurgical wound dressing: Secondary | ICD-10-CM | POA: Diagnosis not present

## 2019-06-09 DIAGNOSIS — Z6821 Body mass index (BMI) 21.0-21.9, adult: Secondary | ICD-10-CM | POA: Diagnosis not present

## 2019-06-09 DIAGNOSIS — E1122 Type 2 diabetes mellitus with diabetic chronic kidney disease: Secondary | ICD-10-CM | POA: Diagnosis not present

## 2019-06-09 DIAGNOSIS — E669 Obesity, unspecified: Secondary | ICD-10-CM | POA: Diagnosis not present

## 2019-06-09 DIAGNOSIS — M109 Gout, unspecified: Secondary | ICD-10-CM | POA: Diagnosis not present

## 2019-06-09 DIAGNOSIS — E1151 Type 2 diabetes mellitus with diabetic peripheral angiopathy without gangrene: Secondary | ICD-10-CM | POA: Diagnosis not present

## 2019-06-09 DIAGNOSIS — L97821 Non-pressure chronic ulcer of other part of left lower leg limited to breakdown of skin: Secondary | ICD-10-CM | POA: Diagnosis not present

## 2019-06-09 DIAGNOSIS — I872 Venous insufficiency (chronic) (peripheral): Secondary | ICD-10-CM | POA: Diagnosis not present

## 2019-06-09 DIAGNOSIS — H6123 Impacted cerumen, bilateral: Secondary | ICD-10-CM | POA: Diagnosis not present

## 2019-06-09 DIAGNOSIS — I129 Hypertensive chronic kidney disease with stage 1 through stage 4 chronic kidney disease, or unspecified chronic kidney disease: Secondary | ICD-10-CM | POA: Diagnosis not present

## 2019-06-09 DIAGNOSIS — F5104 Psychophysiologic insomnia: Secondary | ICD-10-CM | POA: Diagnosis not present

## 2019-06-09 DIAGNOSIS — M5137 Other intervertebral disc degeneration, lumbosacral region: Secondary | ICD-10-CM | POA: Diagnosis not present

## 2019-06-09 DIAGNOSIS — L97811 Non-pressure chronic ulcer of other part of right lower leg limited to breakdown of skin: Secondary | ICD-10-CM | POA: Diagnosis not present

## 2019-06-09 DIAGNOSIS — M5416 Radiculopathy, lumbar region: Secondary | ICD-10-CM | POA: Diagnosis not present

## 2019-06-09 DIAGNOSIS — N183 Chronic kidney disease, stage 3 (moderate): Secondary | ICD-10-CM | POA: Diagnosis not present

## 2019-06-09 DIAGNOSIS — M810 Age-related osteoporosis without current pathological fracture: Secondary | ICD-10-CM | POA: Diagnosis not present

## 2019-06-09 DIAGNOSIS — E785 Hyperlipidemia, unspecified: Secondary | ICD-10-CM | POA: Diagnosis not present

## 2019-06-10 DIAGNOSIS — I129 Hypertensive chronic kidney disease with stage 1 through stage 4 chronic kidney disease, or unspecified chronic kidney disease: Secondary | ICD-10-CM | POA: Diagnosis not present

## 2019-06-10 DIAGNOSIS — L97821 Non-pressure chronic ulcer of other part of left lower leg limited to breakdown of skin: Secondary | ICD-10-CM | POA: Diagnosis not present

## 2019-06-10 DIAGNOSIS — L97811 Non-pressure chronic ulcer of other part of right lower leg limited to breakdown of skin: Secondary | ICD-10-CM | POA: Diagnosis not present

## 2019-06-10 DIAGNOSIS — E1151 Type 2 diabetes mellitus with diabetic peripheral angiopathy without gangrene: Secondary | ICD-10-CM | POA: Diagnosis not present

## 2019-06-10 DIAGNOSIS — I872 Venous insufficiency (chronic) (peripheral): Secondary | ICD-10-CM | POA: Diagnosis not present

## 2019-06-10 DIAGNOSIS — Z4800 Encounter for change or removal of nonsurgical wound dressing: Secondary | ICD-10-CM | POA: Diagnosis not present

## 2019-06-14 DIAGNOSIS — E1151 Type 2 diabetes mellitus with diabetic peripheral angiopathy without gangrene: Secondary | ICD-10-CM | POA: Diagnosis not present

## 2019-06-14 DIAGNOSIS — L97811 Non-pressure chronic ulcer of other part of right lower leg limited to breakdown of skin: Secondary | ICD-10-CM | POA: Diagnosis not present

## 2019-06-14 DIAGNOSIS — L97821 Non-pressure chronic ulcer of other part of left lower leg limited to breakdown of skin: Secondary | ICD-10-CM | POA: Diagnosis not present

## 2019-06-14 DIAGNOSIS — I129 Hypertensive chronic kidney disease with stage 1 through stage 4 chronic kidney disease, or unspecified chronic kidney disease: Secondary | ICD-10-CM | POA: Diagnosis not present

## 2019-06-14 DIAGNOSIS — I872 Venous insufficiency (chronic) (peripheral): Secondary | ICD-10-CM | POA: Diagnosis not present

## 2019-06-14 DIAGNOSIS — Z4800 Encounter for change or removal of nonsurgical wound dressing: Secondary | ICD-10-CM | POA: Diagnosis not present

## 2019-06-17 DIAGNOSIS — L97811 Non-pressure chronic ulcer of other part of right lower leg limited to breakdown of skin: Secondary | ICD-10-CM | POA: Diagnosis not present

## 2019-06-17 DIAGNOSIS — I129 Hypertensive chronic kidney disease with stage 1 through stage 4 chronic kidney disease, or unspecified chronic kidney disease: Secondary | ICD-10-CM | POA: Diagnosis not present

## 2019-06-17 DIAGNOSIS — Z4800 Encounter for change or removal of nonsurgical wound dressing: Secondary | ICD-10-CM | POA: Diagnosis not present

## 2019-06-17 DIAGNOSIS — L97821 Non-pressure chronic ulcer of other part of left lower leg limited to breakdown of skin: Secondary | ICD-10-CM | POA: Diagnosis not present

## 2019-06-17 DIAGNOSIS — E1151 Type 2 diabetes mellitus with diabetic peripheral angiopathy without gangrene: Secondary | ICD-10-CM | POA: Diagnosis not present

## 2019-06-17 DIAGNOSIS — I872 Venous insufficiency (chronic) (peripheral): Secondary | ICD-10-CM | POA: Diagnosis not present

## 2019-06-21 DIAGNOSIS — E1151 Type 2 diabetes mellitus with diabetic peripheral angiopathy without gangrene: Secondary | ICD-10-CM | POA: Diagnosis not present

## 2019-06-21 DIAGNOSIS — L97821 Non-pressure chronic ulcer of other part of left lower leg limited to breakdown of skin: Secondary | ICD-10-CM | POA: Diagnosis not present

## 2019-06-21 DIAGNOSIS — L97811 Non-pressure chronic ulcer of other part of right lower leg limited to breakdown of skin: Secondary | ICD-10-CM | POA: Diagnosis not present

## 2019-06-21 DIAGNOSIS — Z4800 Encounter for change or removal of nonsurgical wound dressing: Secondary | ICD-10-CM | POA: Diagnosis not present

## 2019-06-21 DIAGNOSIS — I872 Venous insufficiency (chronic) (peripheral): Secondary | ICD-10-CM | POA: Diagnosis not present

## 2019-06-21 DIAGNOSIS — I129 Hypertensive chronic kidney disease with stage 1 through stage 4 chronic kidney disease, or unspecified chronic kidney disease: Secondary | ICD-10-CM | POA: Diagnosis not present

## 2019-06-24 DIAGNOSIS — L97811 Non-pressure chronic ulcer of other part of right lower leg limited to breakdown of skin: Secondary | ICD-10-CM | POA: Diagnosis not present

## 2019-06-24 DIAGNOSIS — Z4800 Encounter for change or removal of nonsurgical wound dressing: Secondary | ICD-10-CM | POA: Diagnosis not present

## 2019-06-24 DIAGNOSIS — I129 Hypertensive chronic kidney disease with stage 1 through stage 4 chronic kidney disease, or unspecified chronic kidney disease: Secondary | ICD-10-CM | POA: Diagnosis not present

## 2019-06-24 DIAGNOSIS — L97821 Non-pressure chronic ulcer of other part of left lower leg limited to breakdown of skin: Secondary | ICD-10-CM | POA: Diagnosis not present

## 2019-06-24 DIAGNOSIS — E1151 Type 2 diabetes mellitus with diabetic peripheral angiopathy without gangrene: Secondary | ICD-10-CM | POA: Diagnosis not present

## 2019-06-24 DIAGNOSIS — I872 Venous insufficiency (chronic) (peripheral): Secondary | ICD-10-CM | POA: Diagnosis not present

## 2019-06-28 DIAGNOSIS — L97811 Non-pressure chronic ulcer of other part of right lower leg limited to breakdown of skin: Secondary | ICD-10-CM | POA: Diagnosis not present

## 2019-06-28 DIAGNOSIS — I872 Venous insufficiency (chronic) (peripheral): Secondary | ICD-10-CM | POA: Diagnosis not present

## 2019-06-28 DIAGNOSIS — L97821 Non-pressure chronic ulcer of other part of left lower leg limited to breakdown of skin: Secondary | ICD-10-CM | POA: Diagnosis not present

## 2019-06-28 DIAGNOSIS — E1151 Type 2 diabetes mellitus with diabetic peripheral angiopathy without gangrene: Secondary | ICD-10-CM | POA: Diagnosis not present

## 2019-06-28 DIAGNOSIS — I129 Hypertensive chronic kidney disease with stage 1 through stage 4 chronic kidney disease, or unspecified chronic kidney disease: Secondary | ICD-10-CM | POA: Diagnosis not present

## 2019-06-28 DIAGNOSIS — Z4800 Encounter for change or removal of nonsurgical wound dressing: Secondary | ICD-10-CM | POA: Diagnosis not present

## 2019-07-01 DIAGNOSIS — H10413 Chronic giant papillary conjunctivitis, bilateral: Secondary | ICD-10-CM | POA: Diagnosis not present

## 2019-07-01 DIAGNOSIS — H04123 Dry eye syndrome of bilateral lacrimal glands: Secondary | ICD-10-CM | POA: Diagnosis not present

## 2019-07-01 DIAGNOSIS — Z961 Presence of intraocular lens: Secondary | ICD-10-CM | POA: Diagnosis not present

## 2019-07-01 DIAGNOSIS — H40013 Open angle with borderline findings, low risk, bilateral: Secondary | ICD-10-CM | POA: Diagnosis not present

## 2019-07-02 DIAGNOSIS — Z4800 Encounter for change or removal of nonsurgical wound dressing: Secondary | ICD-10-CM | POA: Diagnosis not present

## 2019-07-02 DIAGNOSIS — L97811 Non-pressure chronic ulcer of other part of right lower leg limited to breakdown of skin: Secondary | ICD-10-CM | POA: Diagnosis not present

## 2019-07-02 DIAGNOSIS — E1151 Type 2 diabetes mellitus with diabetic peripheral angiopathy without gangrene: Secondary | ICD-10-CM | POA: Diagnosis not present

## 2019-07-02 DIAGNOSIS — L97821 Non-pressure chronic ulcer of other part of left lower leg limited to breakdown of skin: Secondary | ICD-10-CM | POA: Diagnosis not present

## 2019-07-02 DIAGNOSIS — I872 Venous insufficiency (chronic) (peripheral): Secondary | ICD-10-CM | POA: Diagnosis not present

## 2019-07-02 DIAGNOSIS — I129 Hypertensive chronic kidney disease with stage 1 through stage 4 chronic kidney disease, or unspecified chronic kidney disease: Secondary | ICD-10-CM | POA: Diagnosis not present

## 2019-07-05 DIAGNOSIS — L97821 Non-pressure chronic ulcer of other part of left lower leg limited to breakdown of skin: Secondary | ICD-10-CM | POA: Diagnosis not present

## 2019-07-05 DIAGNOSIS — L97811 Non-pressure chronic ulcer of other part of right lower leg limited to breakdown of skin: Secondary | ICD-10-CM | POA: Diagnosis not present

## 2019-07-05 DIAGNOSIS — E1151 Type 2 diabetes mellitus with diabetic peripheral angiopathy without gangrene: Secondary | ICD-10-CM | POA: Diagnosis not present

## 2019-07-05 DIAGNOSIS — I872 Venous insufficiency (chronic) (peripheral): Secondary | ICD-10-CM | POA: Diagnosis not present

## 2019-07-05 DIAGNOSIS — Z4800 Encounter for change or removal of nonsurgical wound dressing: Secondary | ICD-10-CM | POA: Diagnosis not present

## 2019-07-05 DIAGNOSIS — I129 Hypertensive chronic kidney disease with stage 1 through stage 4 chronic kidney disease, or unspecified chronic kidney disease: Secondary | ICD-10-CM | POA: Diagnosis not present

## 2019-07-08 DIAGNOSIS — E1151 Type 2 diabetes mellitus with diabetic peripheral angiopathy without gangrene: Secondary | ICD-10-CM | POA: Diagnosis not present

## 2019-07-08 DIAGNOSIS — N952 Postmenopausal atrophic vaginitis: Secondary | ICD-10-CM | POA: Diagnosis not present

## 2019-07-08 DIAGNOSIS — R3915 Urgency of urination: Secondary | ICD-10-CM | POA: Diagnosis not present

## 2019-07-08 DIAGNOSIS — I129 Hypertensive chronic kidney disease with stage 1 through stage 4 chronic kidney disease, or unspecified chronic kidney disease: Secondary | ICD-10-CM | POA: Diagnosis not present

## 2019-07-08 DIAGNOSIS — Z4800 Encounter for change or removal of nonsurgical wound dressing: Secondary | ICD-10-CM | POA: Diagnosis not present

## 2019-07-08 DIAGNOSIS — L97821 Non-pressure chronic ulcer of other part of left lower leg limited to breakdown of skin: Secondary | ICD-10-CM | POA: Diagnosis not present

## 2019-07-08 DIAGNOSIS — L97811 Non-pressure chronic ulcer of other part of right lower leg limited to breakdown of skin: Secondary | ICD-10-CM | POA: Diagnosis not present

## 2019-07-08 DIAGNOSIS — R350 Frequency of micturition: Secondary | ICD-10-CM | POA: Diagnosis not present

## 2019-07-08 DIAGNOSIS — I872 Venous insufficiency (chronic) (peripheral): Secondary | ICD-10-CM | POA: Diagnosis not present

## 2019-07-09 DIAGNOSIS — I872 Venous insufficiency (chronic) (peripheral): Secondary | ICD-10-CM | POA: Diagnosis not present

## 2019-07-09 DIAGNOSIS — E785 Hyperlipidemia, unspecified: Secondary | ICD-10-CM | POA: Diagnosis not present

## 2019-07-09 DIAGNOSIS — E669 Obesity, unspecified: Secondary | ICD-10-CM | POA: Diagnosis not present

## 2019-07-09 DIAGNOSIS — F329 Major depressive disorder, single episode, unspecified: Secondary | ICD-10-CM | POA: Diagnosis not present

## 2019-07-09 DIAGNOSIS — L97811 Non-pressure chronic ulcer of other part of right lower leg limited to breakdown of skin: Secondary | ICD-10-CM | POA: Diagnosis not present

## 2019-07-09 DIAGNOSIS — Z6821 Body mass index (BMI) 21.0-21.9, adult: Secondary | ICD-10-CM | POA: Diagnosis not present

## 2019-07-09 DIAGNOSIS — H6123 Impacted cerumen, bilateral: Secondary | ICD-10-CM | POA: Diagnosis not present

## 2019-07-09 DIAGNOSIS — M810 Age-related osteoporosis without current pathological fracture: Secondary | ICD-10-CM | POA: Diagnosis not present

## 2019-07-09 DIAGNOSIS — G4733 Obstructive sleep apnea (adult) (pediatric): Secondary | ICD-10-CM | POA: Diagnosis not present

## 2019-07-09 DIAGNOSIS — N183 Chronic kidney disease, stage 3 (moderate): Secondary | ICD-10-CM | POA: Diagnosis not present

## 2019-07-09 DIAGNOSIS — E1122 Type 2 diabetes mellitus with diabetic chronic kidney disease: Secondary | ICD-10-CM | POA: Diagnosis not present

## 2019-07-09 DIAGNOSIS — M5137 Other intervertebral disc degeneration, lumbosacral region: Secondary | ICD-10-CM | POA: Diagnosis not present

## 2019-07-09 DIAGNOSIS — D631 Anemia in chronic kidney disease: Secondary | ICD-10-CM | POA: Diagnosis not present

## 2019-07-09 DIAGNOSIS — M1612 Unilateral primary osteoarthritis, left hip: Secondary | ICD-10-CM | POA: Diagnosis not present

## 2019-07-09 DIAGNOSIS — H60501 Unspecified acute noninfective otitis externa, right ear: Secondary | ICD-10-CM | POA: Diagnosis not present

## 2019-07-09 DIAGNOSIS — F5104 Psychophysiologic insomnia: Secondary | ICD-10-CM | POA: Diagnosis not present

## 2019-07-09 DIAGNOSIS — Z853 Personal history of malignant neoplasm of breast: Secondary | ICD-10-CM | POA: Diagnosis not present

## 2019-07-09 DIAGNOSIS — E1151 Type 2 diabetes mellitus with diabetic peripheral angiopathy without gangrene: Secondary | ICD-10-CM | POA: Diagnosis not present

## 2019-07-09 DIAGNOSIS — J449 Chronic obstructive pulmonary disease, unspecified: Secondary | ICD-10-CM | POA: Diagnosis not present

## 2019-07-09 DIAGNOSIS — M109 Gout, unspecified: Secondary | ICD-10-CM | POA: Diagnosis not present

## 2019-07-09 DIAGNOSIS — Z4800 Encounter for change or removal of nonsurgical wound dressing: Secondary | ICD-10-CM | POA: Diagnosis not present

## 2019-07-09 DIAGNOSIS — M5416 Radiculopathy, lumbar region: Secondary | ICD-10-CM | POA: Diagnosis not present

## 2019-07-09 DIAGNOSIS — H9193 Unspecified hearing loss, bilateral: Secondary | ICD-10-CM | POA: Diagnosis not present

## 2019-07-09 DIAGNOSIS — I4892 Unspecified atrial flutter: Secondary | ICD-10-CM | POA: Diagnosis not present

## 2019-07-09 DIAGNOSIS — I129 Hypertensive chronic kidney disease with stage 1 through stage 4 chronic kidney disease, or unspecified chronic kidney disease: Secondary | ICD-10-CM | POA: Diagnosis not present

## 2019-07-13 DIAGNOSIS — I129 Hypertensive chronic kidney disease with stage 1 through stage 4 chronic kidney disease, or unspecified chronic kidney disease: Secondary | ICD-10-CM | POA: Diagnosis not present

## 2019-07-13 DIAGNOSIS — L97811 Non-pressure chronic ulcer of other part of right lower leg limited to breakdown of skin: Secondary | ICD-10-CM | POA: Diagnosis not present

## 2019-07-13 DIAGNOSIS — E1122 Type 2 diabetes mellitus with diabetic chronic kidney disease: Secondary | ICD-10-CM | POA: Diagnosis not present

## 2019-07-13 DIAGNOSIS — I872 Venous insufficiency (chronic) (peripheral): Secondary | ICD-10-CM | POA: Diagnosis not present

## 2019-07-13 DIAGNOSIS — E1151 Type 2 diabetes mellitus with diabetic peripheral angiopathy without gangrene: Secondary | ICD-10-CM | POA: Diagnosis not present

## 2019-07-13 DIAGNOSIS — Z4800 Encounter for change or removal of nonsurgical wound dressing: Secondary | ICD-10-CM | POA: Diagnosis not present

## 2019-07-14 ENCOUNTER — Telehealth: Payer: Self-pay | Admitting: Neurology

## 2019-07-14 ENCOUNTER — Ambulatory Visit (INDEPENDENT_AMBULATORY_CARE_PROVIDER_SITE_OTHER): Payer: Medicare Other | Admitting: Neurology

## 2019-07-14 ENCOUNTER — Encounter: Payer: Self-pay | Admitting: Neurology

## 2019-07-14 ENCOUNTER — Other Ambulatory Visit: Payer: Self-pay

## 2019-07-14 VITALS — BP 126/60 | HR 60 | Temp 98.4°F | Ht 65.0 in | Wt 122.0 lb

## 2019-07-14 DIAGNOSIS — N183 Chronic kidney disease, stage 3 unspecified: Secondary | ICD-10-CM

## 2019-07-14 DIAGNOSIS — G309 Alzheimer's disease, unspecified: Secondary | ICD-10-CM | POA: Diagnosis not present

## 2019-07-14 DIAGNOSIS — M48061 Spinal stenosis, lumbar region without neurogenic claudication: Secondary | ICD-10-CM

## 2019-07-14 DIAGNOSIS — F0280 Dementia in other diseases classified elsewhere without behavioral disturbance: Secondary | ICD-10-CM

## 2019-07-14 DIAGNOSIS — I6521 Occlusion and stenosis of right carotid artery: Secondary | ICD-10-CM | POA: Diagnosis not present

## 2019-07-14 DIAGNOSIS — R2689 Other abnormalities of gait and mobility: Secondary | ICD-10-CM | POA: Diagnosis not present

## 2019-07-14 MED ORDER — GABAPENTIN 100 MG PO CAPS: 100.0000 mg | ORAL_CAPSULE | Freq: Every day | ORAL | 3 refills | Status: AC

## 2019-07-14 MED ORDER — MEMANTINE HCL 10 MG PO TABS: ORAL_TABLET | ORAL | 5 refills | Status: DC

## 2019-07-14 MED ORDER — MEMANTINE HCL 10 MG PO TABS: ORAL_TABLET | ORAL | 0 refills | Status: DC

## 2019-07-14 NOTE — Patient Instructions (Signed)
Alzheimer Disease Caregiver Guide  Alzheimer disease causes a person to lose the ability to remember things and make decisions. A person who has Alzheimer disease may not be able to take care of himself or herself. He or she may need help with simple tasks. The tips below can help you care for the person. What kind of changes does this condition cause? This condition makes a person:  Forget things.  Feel confused.  Act differently.  Have different moods. These things get worse with time. Tips to help with symptoms  Be calm and patient.  Respond with a simple, short answer.  Avoid correcting the person in a negative way.  Try not to take things personally, even if the person forgets your name.  Do not argue with the person. This may make the person more upset. Tips to lessen frustration  Make appointments and do daily tasks when the person is at his or her best.  Take your time. Simple tasks may take longer. Allow plenty of time to complete tasks.  Limit choices for the person.  Involve the person in what you are doing.  Keep a daily routine.  Avoid new or crowded places, if possible.  Use simple words, short sentences, and a calm voice. Only give one direction at a time.  Buy clothes and shoes that are easy to put on and take off.  Organize medicines in a pillbox for each day of the week.  Keep a calendar in a central location to remind the person of meetings or other activities.  Let people help if they offer. Take a break when needed. Tips to prevent injury  Keep floors clear. Remove rugs, magazine racks, and floor lamps.  Keep hallways well-lit.  Put a handrail and non-slip mat in the bathtub or shower.  Put childproof locks on cabinets that have dangerous items in them. These items include medicine, alcohol, guns, toxic cleaning items, sharp tools, matches, and lighters.  Put locks on doors where the person cannot see or reach them. This helps the person  to not wander out of the house and get lost.  Be prepared for emergencies. Keep a list of emergency phone numbers and addresses close by.  Bracelets may be worn that track location and identify the person as having memory problems. This should be worn at all times for safety. Tips for the future  Discuss financial and legal planning early. People with this disease have trouble managing their money as the disease gets worse. Get help from a professional.  Talk about advance directives, safety, and daily care. Take these steps: ? Create a living will and choose a power of attorney. This is someone who can make decisions for the person with Alzheimer disease when he or she can no longer do so. ? Discuss driving safety and when to stop driving. The person's doctor can help with this. ? If the person lives alone, make sure he or she is safe. Some people need extra help at home. Other people need more care at a nursing home or care center. Where to find support You can find support by joining a support group near you. Some benefits of joining a support group include:  Learning ways to manage stress.  Sharing experiences with others.  Getting emotional comfort and support.  Learning about caregiving as the disease progresses.  Knowing what community resources are available and making use of them. Where to find more information  Alzheimer's Association: [employer] Contact a doctor if:  The person has a fever.  The person has a sudden behavior change that does not get better with calming strategies.  The person is not able to take care of himself or herself at home.  The person threatens you or anyone else, including himself or herself.  You are no longer able to care for the person. Summary  Alzheimer disease causes a person to forget things and to be confused.  A person who has this condition may not be able to take care of himself or herself.  Take steps to keep the person  from getting hurt. Plan for future care.  You can find support by joining a support group near you. This information is not intended to replace advice given to you by your health care provider. Make sure you discuss any questions you have with your health care provider. Document Released: 03/02/2012 Document Revised: 03/30/2019 Document Reviewed: 12/04/2017 Elsevier Patient Education  2020 [employer].

## 2019-07-14 NOTE — Progress Notes (Signed)
PATIENT: Donna Owens DOB: [birth year]  REASON FOR VISIT: follow up HISTORY FROM: patient  HISTORY OF PRESENT ILLNESS: Today 07/14/19:  Interval History for this 85 year old african [place] female, who presents with cognitive decline and falls.  She fall s over "stuff", she walks with a cane, gait is more unsteady. Overall  Low muscle mass, clothes have gotten too big for her.  She drinks not enough, has trouble to tise form a seated position and goes each night 2-3 times . She naps throughout the day. Her daughter h who is here reported.     MM_ 09-15-2017 Donna Owens is an 85 year old female with a history of memory disturbance.  She returns today for follow-up.  She is currently lives at home alone.  She is on Namenda 5 mg twice a day.  She feels that her memory has remained the same.  She is able to complete all ADLs independently.  She does do some simple cooking.  Daughter notes that she had burn food but this is not consistent.  She manages her own finances.  She manages her own medications.  Her daughter helps her with appointment.  She does note that she does not sleep very well.  She states most nights it takes her a while to fall asleep.  She returns today for an evaluation.  HISTORY  03/19/18 Donna Owens is an 85 year old female with a history of memory disturbance.  She returns today for follow-up.  She states that since last visit she had a fall in October and went to the hospital.  She was discharged [facility] and is now living in [place] with her daughter.  He reports that they plan to move to her home in [place].  Reports that her daughter will be living with her.  She is able to complete all ADLs independently.  She does not operate a motor vehicle.  She reports that her sleep is fragmented.  Reports that she was given a prescription of gabapentin but she does not take it consistently.  She reports a good appetite.  She manages her own finances.  She now uses a cane when  ambulating.  Denies any falls since October.  She is currently not on any memory medication.  She has noticed that her memory is worse since her fall.  She returns today for evaluation.   REVIEW OF SYSTEMS: Out of a complete 14 system review of symptoms, the patient complains only of the following symptoms, and all other reviewed systems are negative.  See HPI   Shuffling gait , unable to walk unassisted, holding on to my elbow-   ALLERGIES: No Known Allergies  HOME MEDICATIONS: Outpatient Medications Prior to Visit  Medication Sig Dispense Refill  . acetaminophen (TYLENOL) 325 MG tablet Take 325 mg by mouth every 6 (six) hours as needed for moderate pain or headache.     . Calcium Carbonate-Vitamin D (CALCIUM-VITAMIN D3 PO) Take 1 tablet by mouth daily.    . Cholecalciferol (VITAMIN D-3) 5000 units TABS Take 5,000 Units by mouth daily.     . Coenzyme Q10 (CO Q 10) 100 MG CAPS Take 100 mg by mouth daily.     . Cyanocobalamin (VITAMIN B-12) 2500 MCG SUBL Place 2,500 mcg under the tongue daily.    . divalproex (DEPAKOTE SPRINKLE) 125 MG capsule Take 125 mg by mouth daily.    Marland Kitchen doxepin (SINEQUAN) 10 MG capsule TK 1 C PO HS FOR SLP    .  doxycycline (VIBRA-TABS) 100 MG tablet     . ELIQUIS 2.5 MG TABS tablet TAKE 1 TABLET(2.5 MG) BY MOUTH TWICE DAILY 180 tablet 2  . ferrous sulfate 325 (65 FE) MG tablet Take 325 mg by mouth daily.   12  . furosemide (LASIX) 20 MG tablet Take 0.5 tablets (10 mg total) by mouth every Monday, Wednesday, and Friday. 30 tablet   . gabapentin (NEURONTIN) 300 MG capsule Take 1 capsule (300 mg total) by mouth at bedtime. (Patient taking differently: Take 100 mg by mouth at bedtime. ) 90 capsule 11  . metFORMIN (GLUCOPHAGE) 500 MG tablet TAKE 1 TABLET BY MOUTH DAILY 30 tablet 9  . MYRBETRIQ 50 MG TB24 tablet TK 1 T PO QD    . olmesartan-hydrochlorothiazide (BENICAR HCT) 20-12.5 MG tablet     . omega-3 acid ethyl esters (LOVAZA) 1 g capsule Take 1 g by mouth daily.     Vladimir Faster Glycol-Propyl Glycol (SYSTANE) 0.4-0.3 % SOLN Place 2 drops into both eyes every 8 (eight) hours as needed (for dry eyes).     . pravastatin (PRAVACHOL) 40 MG tablet Take 40 mg by mouth daily.    . traMADol (ULTRAM) 50 MG tablet      No facility-administered medications prior to visit.     PAST MEDICAL HISTORY: Past Medical History:  Diagnosis Date  . Anemia   . Anxiety   . Arthritis    "back and right knee" (07/08/2018)  . Atrophic vaginitis   . Breast cancer, right breast ([place]) 2001  . Carotid arterial disease ([place]) 12/13/11    mild by doppler  . Chronic lower back pain   . CVA (cerebral vascular accident) [facility])    "dr never told me I had one" (07/08/2018)  . Depression   . Hyperlipemia   . Hypertension   . Migraine    "1-2/year" (07/08/2018)  . OSA (obstructive sleep apnea)   . OSA on CPAP 06/20/2014  . Osteoporosis   . Presence of permanent cardiac pacemaker 07/08/2018  . Pulmonary hypertension ([place])    moderate echo 11/07/10  . PVD (peripheral vascular disease) ([place])   . Sleep apnea   . Type II diabetes mellitus ([place])     PAST SURGICAL HISTORY: Past Surgical History:  Procedure Laterality Date  . BREAST LUMPECTOMY Right 07/26/2000   chemo/radiation  . BREAST LUMPECTOMY Left    benign  . CARDIOVERSION N/A 07/21/2014   Procedure: CARDIOVERSION;  Surgeon: Sanda Klein, MD;  Location: [facility];  Service: Cardiovascular;  Laterality: N/A;  . CATARACT EXTRACTION W/ INTRAOCULAR LENS  IMPLANT, BILATERAL Bilateral    "not sure about left eye; ?lens"  . COLONOSCOPY  2010  . INSERT / REPLACE / REMOVE PACEMAKER  07/08/2018  . PACEMAKER IMPLANT N/A 07/08/2018   Procedure: PACEMAKER IMPLANT;  Surgeon: Sanda Klein, MD;  Location: [facility];  Service: Cardiovascular;  Laterality: N/A;  . TOENAIL EXCISION  03/2013   ingrown nail    FAMILY HISTORY: Family History  Problem Relation Age of Onset  . Diabetes Brother   . Hypertension Brother   .  Heart failure Daughter 77  . Diabetes Other     SOCIAL HISTORY: Social History   Socioeconomic History  . Marital status: Divorced    Spouse name: Not on file  . Number of children: 1  . Years of education: BSN  . Highest education level: Not on file  Occupational History  . Occupation: RETIRED    Employer: RETIRED  Social Needs  .  Financial resource strain: Not on file  . Food insecurity    Worry: Not on file    Inability: Not on file  . Transportation needs    Medical: Not on file    Non-medical: Not on file  Tobacco Use  . Smoking status: Never Smoker  . Smokeless tobacco: Never Used  Substance and Sexual Activity  . Alcohol use: Never    Frequency: Never  . Drug use: Never  . Sexual activity: Not on file  Lifestyle  . Physical activity    Days per week: Not on file    Minutes per session: Not on file  . Stress: Not on file  Relationships  . Social Herbalist on phone: Not on file    Gets together: Not on file    Attends religious service: Not on file    Active member of club or organization: Not on file    Attends meetings of clubs or organizations: Not on file    Relationship status: Not on file  . Intimate partner violence    Fear of current or ex partner: Not on file    Emotionally abused: Not on file    Physically abused: Not on file    Forced sexual activity: Not on file  Other Topics Concern  . Not on file  Social History Narrative   Patient is divorced and lives alone.   Patient has one adult daughter.   Patient is retired.   Patient has a BSN degree in nursing.   Patient is right-handed.   Patient drinks one soda per week.      PHYSICAL EXAM  Vitals:   07/14/19 1331  BP: 126/60  Pulse: 60  Temp: 98.4 F (36.9 C)  Weight: 122 lb (55.3 kg)  Height: 5\' 5"  (1.651 m)   Body mass index is 20.3 kg/m.   MMSE - Mini Mental State Exam 07/14/2019 09/23/2018 03/19/2018  Not completed: - (No Data) -  Orientation to time 4 4 4    Orientation to Place 3 4 2   Registration 3 3 3   Attention/ Calculation 0 0 0  Attention/Calculation-comments - - -  Recall 0 2 1  Language- name 2 objects 1 2 2   Language- repeat 1 1 1   Language- follow 3 step command 2 3 1   Language- read & follow direction 1 1 1   Write a sentence 0 1 1  Copy design 0 0 0  Total score 15 21 16      Generalized: Well developed, in no acute distress, in leisure clothes.    Neurological examination  Mentation: Alert oriented to time, place, history taking. hearing impairment affects communication.  Clearly demented by MMSE.  Cranial nerve : denies loss of smell and taste.   Extraocular movements were full, visual field were full on confrontational test. Facial sensation and strength were normal. Uvula and tongue midline. Head turning and shoulder shrug  were restricited. Stooped posture.   Clearly atrophy and mass loss over the quadriceps, stooped posture.  Walks with her feet pointing outwards.  Shuffling gait, turning with 5 -6 steps. Holding on to the wall. Walks better when leaning onto an object like a shopping cart.    trophy of muscles on all 4 extremities. Coordination: Cerebellar testing reveals good finger-nose-finger there is no heel to shin possible.   Reflexes: Deep tendon reflexes could be elicited in patella and achilles tendon.   DIAGNOSTIC DATA (LABS, IMAGING, TESTING) - I reviewed patient records, labs, notes, testing  and imaging myself where available.  Pacemaker was implanted 07-08-2018 at [facility] Dr. Benn Moulder.   Decreasing GFR.    Lab Results  Component Value Date   WBC 5.3 06/04/2019   HGB 12.1 06/04/2019   HCT 36.7 06/04/2019   MCV 89 06/04/2019   PLT 146 (L) 06/04/2019      Component Value Date/Time   NA 139 06/04/2019 1145   K 4.4 06/04/2019 1145   CL 101 06/04/2019 1145   CO2 24 06/04/2019 1145   GLUCOSE 88 06/04/2019 1145   GLUCOSE 119 (H) 10/17/2017 0631   BUN 32 (H) 06/04/2019 1145    CREATININE 1.55 (H) 06/04/2019 1145   CREATININE 1.23 (H) 10/05/2015 1151   CALCIUM 10.2 06/04/2019 1145   PROT 7.5 06/04/2019 1145   ALBUMIN 4.3 06/04/2019 1145   AST 20 06/04/2019 1145   ALT 9 06/04/2019 1145   ALKPHOS 64 06/04/2019 1145   BILITOT 0.6 06/04/2019 1145   GFRNONAA 29 (L) 06/04/2019 1145   GFRNONAA 45 (L) 06/29/2014 1607   GFRAA 34 (L) 06/04/2019 1145   GFRAA 52 (L) 06/29/2014 1607   Lab Results  Component Value Date   CHOL 163 06/04/2019   HDL 74 06/04/2019   LDLCALC 80 06/04/2019   TRIG 46 06/04/2019   CHOLHDL 2.2 06/04/2019   Lab Results  Component Value Date   HGBA1C 5.4 02/02/2014   Lab Results  Component Value Date   VITAMINB12 3,005 (H) 10/13/2017   Lab Results  Component Value Date   TSH 1.630 06/04/2019      ASSESSMENT AND PLAN 85 y.o. year old female  has a past medical history of Anemia, Anxiety, Arthritis, Atrophic vaginitis, Breast cancer, right breast ([place]) (2001), Carotid arterial disease ([place]) (12/13/11), Chronic lower back pain, CVA (cerebral vascular accident) ([place]), Depression, Hyperlipemia, Hypertension, Migraine, Osteoporosis, Presence of permanent cardiac pacemaker (07/08/2018), Pulmonary hypertension ([place]), PVD (peripheral vascular disease) ([place]), and Type II diabetes mellitus ([place]).  CKD 3 . here with :  1. Memory loss progressive in the setting of CKD grade 3.   The patient's memory score has decreased significantly -  She will continue on Namenda 5 mg twice a day. I will increase to 10 mg bid .   She is currently living at home alone and hs 4 steps to enter her residence.  She has forgotten medication and misplaced medication.  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 She will in October move in with her daughter.   She could benefit from a lift chair.   She is not using her walker. PT gait therapy for fall prevention.          Advised that if her symptoms worsen or she develops new  symptoms she should let us know.  She will follow-up in 6 months or sooner if needed.  I spent 25 minutes with the patient. 50% of this time was spent reviewing her memory score    07/14/2019, 1:50 PM [facility] [address], [place], [place] [address] [phone]

## 2019-07-14 NOTE — Telephone Encounter (Signed)
Received a call from the pharmacy stating they dont have a active script for namenda on file as the patient hasn't picked it up in several months.our original script was sent in march 2019 and had refills to last a year but the patient had not been getting it. I advised I will call and discuss with the daughter and explain that we will need to actually titrate up from 5 mg bid first. Called the daughter and advised her of the medication and that we will slowly work up from 5mg  to 10 mg. Patient daughter understood and pharmacy will wait for another script.

## 2019-07-15 DIAGNOSIS — E1151 Type 2 diabetes mellitus with diabetic peripheral angiopathy without gangrene: Secondary | ICD-10-CM | POA: Diagnosis not present

## 2019-07-15 DIAGNOSIS — E1122 Type 2 diabetes mellitus with diabetic chronic kidney disease: Secondary | ICD-10-CM | POA: Diagnosis not present

## 2019-07-15 DIAGNOSIS — Z4800 Encounter for change or removal of nonsurgical wound dressing: Secondary | ICD-10-CM | POA: Diagnosis not present

## 2019-07-15 DIAGNOSIS — I129 Hypertensive chronic kidney disease with stage 1 through stage 4 chronic kidney disease, or unspecified chronic kidney disease: Secondary | ICD-10-CM | POA: Diagnosis not present

## 2019-07-15 DIAGNOSIS — L97811 Non-pressure chronic ulcer of other part of right lower leg limited to breakdown of skin: Secondary | ICD-10-CM | POA: Diagnosis not present

## 2019-07-15 DIAGNOSIS — I872 Venous insufficiency (chronic) (peripheral): Secondary | ICD-10-CM | POA: Diagnosis not present

## 2019-07-19 DIAGNOSIS — L97811 Non-pressure chronic ulcer of other part of right lower leg limited to breakdown of skin: Secondary | ICD-10-CM | POA: Diagnosis not present

## 2019-07-19 DIAGNOSIS — I872 Venous insufficiency (chronic) (peripheral): Secondary | ICD-10-CM | POA: Diagnosis not present

## 2019-07-19 DIAGNOSIS — I129 Hypertensive chronic kidney disease with stage 1 through stage 4 chronic kidney disease, or unspecified chronic kidney disease: Secondary | ICD-10-CM | POA: Diagnosis not present

## 2019-07-19 DIAGNOSIS — E1122 Type 2 diabetes mellitus with diabetic chronic kidney disease: Secondary | ICD-10-CM | POA: Diagnosis not present

## 2019-07-19 DIAGNOSIS — E1151 Type 2 diabetes mellitus with diabetic peripheral angiopathy without gangrene: Secondary | ICD-10-CM | POA: Diagnosis not present

## 2019-07-19 DIAGNOSIS — Z4800 Encounter for change or removal of nonsurgical wound dressing: Secondary | ICD-10-CM | POA: Diagnosis not present

## 2019-07-26 DIAGNOSIS — L97811 Non-pressure chronic ulcer of other part of right lower leg limited to breakdown of skin: Secondary | ICD-10-CM | POA: Diagnosis not present

## 2019-07-26 DIAGNOSIS — Z4800 Encounter for change or removal of nonsurgical wound dressing: Secondary | ICD-10-CM | POA: Diagnosis not present

## 2019-07-26 DIAGNOSIS — E1151 Type 2 diabetes mellitus with diabetic peripheral angiopathy without gangrene: Secondary | ICD-10-CM | POA: Diagnosis not present

## 2019-07-26 DIAGNOSIS — I872 Venous insufficiency (chronic) (peripheral): Secondary | ICD-10-CM | POA: Diagnosis not present

## 2019-07-26 DIAGNOSIS — I129 Hypertensive chronic kidney disease with stage 1 through stage 4 chronic kidney disease, or unspecified chronic kidney disease: Secondary | ICD-10-CM | POA: Diagnosis not present

## 2019-07-26 DIAGNOSIS — E1122 Type 2 diabetes mellitus with diabetic chronic kidney disease: Secondary | ICD-10-CM | POA: Diagnosis not present

## 2019-07-28 DIAGNOSIS — E7849 Other hyperlipidemia: Secondary | ICD-10-CM | POA: Diagnosis not present

## 2019-07-28 DIAGNOSIS — E1151 Type 2 diabetes mellitus with diabetic peripheral angiopathy without gangrene: Secondary | ICD-10-CM | POA: Diagnosis not present

## 2019-07-30 DIAGNOSIS — R269 Unspecified abnormalities of gait and mobility: Secondary | ICD-10-CM | POA: Diagnosis not present

## 2019-07-30 DIAGNOSIS — G309 Alzheimer's disease, unspecified: Secondary | ICD-10-CM | POA: Diagnosis not present

## 2019-07-30 DIAGNOSIS — Z0000 Encounter for general adult medical examination without abnormal findings: Secondary | ICD-10-CM | POA: Diagnosis not present

## 2019-07-30 DIAGNOSIS — E785 Hyperlipidemia, unspecified: Secondary | ICD-10-CM | POA: Diagnosis not present

## 2019-07-30 DIAGNOSIS — M545 Low back pain: Secondary | ICD-10-CM | POA: Diagnosis not present

## 2019-07-30 DIAGNOSIS — R600 Localized edema: Secondary | ICD-10-CM | POA: Diagnosis not present

## 2019-07-30 DIAGNOSIS — F329 Major depressive disorder, single episode, unspecified: Secondary | ICD-10-CM | POA: Diagnosis not present

## 2019-07-30 DIAGNOSIS — I739 Peripheral vascular disease, unspecified: Secondary | ICD-10-CM | POA: Diagnosis not present

## 2019-07-30 DIAGNOSIS — E46 Unspecified protein-calorie malnutrition: Secondary | ICD-10-CM | POA: Diagnosis not present

## 2019-07-30 DIAGNOSIS — N183 Chronic kidney disease, stage 3 (moderate): Secondary | ICD-10-CM | POA: Diagnosis not present

## 2019-07-30 DIAGNOSIS — E1151 Type 2 diabetes mellitus with diabetic peripheral angiopathy without gangrene: Secondary | ICD-10-CM | POA: Diagnosis not present

## 2019-07-30 DIAGNOSIS — G4733 Obstructive sleep apnea (adult) (pediatric): Secondary | ICD-10-CM | POA: Diagnosis not present

## 2019-08-03 DIAGNOSIS — I872 Venous insufficiency (chronic) (peripheral): Secondary | ICD-10-CM | POA: Diagnosis not present

## 2019-08-03 DIAGNOSIS — Z4800 Encounter for change or removal of nonsurgical wound dressing: Secondary | ICD-10-CM | POA: Diagnosis not present

## 2019-08-03 DIAGNOSIS — E1122 Type 2 diabetes mellitus with diabetic chronic kidney disease: Secondary | ICD-10-CM | POA: Diagnosis not present

## 2019-08-03 DIAGNOSIS — E1151 Type 2 diabetes mellitus with diabetic peripheral angiopathy without gangrene: Secondary | ICD-10-CM | POA: Diagnosis not present

## 2019-08-03 DIAGNOSIS — I129 Hypertensive chronic kidney disease with stage 1 through stage 4 chronic kidney disease, or unspecified chronic kidney disease: Secondary | ICD-10-CM | POA: Diagnosis not present

## 2019-08-03 DIAGNOSIS — L97811 Non-pressure chronic ulcer of other part of right lower leg limited to breakdown of skin: Secondary | ICD-10-CM | POA: Diagnosis not present

## 2019-08-05 ENCOUNTER — Ambulatory Visit (INDEPENDENT_AMBULATORY_CARE_PROVIDER_SITE_OTHER): Payer: Medicare Other | Admitting: *Deleted

## 2019-08-05 DIAGNOSIS — I4891 Unspecified atrial fibrillation: Secondary | ICD-10-CM | POA: Diagnosis not present

## 2019-08-06 LAB — CUP PACEART REMOTE DEVICE CHECK
Battery Remaining Longevity: 114 mo
Battery Remaining Percentage: 95.5 %
Battery Voltage: 3.02 V
Brady Statistic RV Percent Paced: 94 %
Date Time Interrogation Session: 20200812060013
Implantable Lead Implant Date: 20190717
Implantable Lead Location: 753860
Implantable Pulse Generator Implant Date: 20190717
Lead Channel Impedance Value: 490 Ohm
Lead Channel Pacing Threshold Amplitude: 0.5 V
Lead Channel Pacing Threshold Pulse Width: 0.5 ms
Lead Channel Sensing Intrinsic Amplitude: 11.1 mV
Lead Channel Setting Pacing Amplitude: 2.5 V
Lead Channel Setting Pacing Pulse Width: 0.5 ms
Lead Channel Setting Sensing Sensitivity: 2 mV
Pulse Gen Model: 1272
Pulse Gen Serial Number: 9002800

## 2019-08-08 DIAGNOSIS — M1612 Unilateral primary osteoarthritis, left hip: Secondary | ICD-10-CM | POA: Diagnosis not present

## 2019-08-08 DIAGNOSIS — E1122 Type 2 diabetes mellitus with diabetic chronic kidney disease: Secondary | ICD-10-CM | POA: Diagnosis not present

## 2019-08-08 DIAGNOSIS — E785 Hyperlipidemia, unspecified: Secondary | ICD-10-CM | POA: Diagnosis not present

## 2019-08-08 DIAGNOSIS — Z4800 Encounter for change or removal of nonsurgical wound dressing: Secondary | ICD-10-CM | POA: Diagnosis not present

## 2019-08-08 DIAGNOSIS — M5416 Radiculopathy, lumbar region: Secondary | ICD-10-CM | POA: Diagnosis not present

## 2019-08-08 DIAGNOSIS — I129 Hypertensive chronic kidney disease with stage 1 through stage 4 chronic kidney disease, or unspecified chronic kidney disease: Secondary | ICD-10-CM | POA: Diagnosis not present

## 2019-08-08 DIAGNOSIS — H6123 Impacted cerumen, bilateral: Secondary | ICD-10-CM | POA: Diagnosis not present

## 2019-08-08 DIAGNOSIS — F5104 Psychophysiologic insomnia: Secondary | ICD-10-CM | POA: Diagnosis not present

## 2019-08-08 DIAGNOSIS — M810 Age-related osteoporosis without current pathological fracture: Secondary | ICD-10-CM | POA: Diagnosis not present

## 2019-08-08 DIAGNOSIS — D631 Anemia in chronic kidney disease: Secondary | ICD-10-CM | POA: Diagnosis not present

## 2019-08-08 DIAGNOSIS — J449 Chronic obstructive pulmonary disease, unspecified: Secondary | ICD-10-CM | POA: Diagnosis not present

## 2019-08-08 DIAGNOSIS — L97811 Non-pressure chronic ulcer of other part of right lower leg limited to breakdown of skin: Secondary | ICD-10-CM | POA: Diagnosis not present

## 2019-08-08 DIAGNOSIS — I872 Venous insufficiency (chronic) (peripheral): Secondary | ICD-10-CM | POA: Diagnosis not present

## 2019-08-08 DIAGNOSIS — E1151 Type 2 diabetes mellitus with diabetic peripheral angiopathy without gangrene: Secondary | ICD-10-CM | POA: Diagnosis not present

## 2019-08-08 DIAGNOSIS — F329 Major depressive disorder, single episode, unspecified: Secondary | ICD-10-CM | POA: Diagnosis not present

## 2019-08-08 DIAGNOSIS — I4892 Unspecified atrial flutter: Secondary | ICD-10-CM | POA: Diagnosis not present

## 2019-08-08 DIAGNOSIS — M109 Gout, unspecified: Secondary | ICD-10-CM | POA: Diagnosis not present

## 2019-08-08 DIAGNOSIS — E669 Obesity, unspecified: Secondary | ICD-10-CM | POA: Diagnosis not present

## 2019-08-08 DIAGNOSIS — Z6821 Body mass index (BMI) 21.0-21.9, adult: Secondary | ICD-10-CM | POA: Diagnosis not present

## 2019-08-08 DIAGNOSIS — M5137 Other intervertebral disc degeneration, lumbosacral region: Secondary | ICD-10-CM | POA: Diagnosis not present

## 2019-08-08 DIAGNOSIS — G4733 Obstructive sleep apnea (adult) (pediatric): Secondary | ICD-10-CM | POA: Diagnosis not present

## 2019-08-08 DIAGNOSIS — N183 Chronic kidney disease, stage 3 (moderate): Secondary | ICD-10-CM | POA: Diagnosis not present

## 2019-08-08 DIAGNOSIS — H9193 Unspecified hearing loss, bilateral: Secondary | ICD-10-CM | POA: Diagnosis not present

## 2019-08-08 DIAGNOSIS — H60501 Unspecified acute noninfective otitis externa, right ear: Secondary | ICD-10-CM | POA: Diagnosis not present

## 2019-08-08 DIAGNOSIS — Z853 Personal history of malignant neoplasm of breast: Secondary | ICD-10-CM | POA: Diagnosis not present

## 2019-08-10 DIAGNOSIS — E1151 Type 2 diabetes mellitus with diabetic peripheral angiopathy without gangrene: Secondary | ICD-10-CM | POA: Diagnosis not present

## 2019-08-10 DIAGNOSIS — E1122 Type 2 diabetes mellitus with diabetic chronic kidney disease: Secondary | ICD-10-CM | POA: Diagnosis not present

## 2019-08-10 DIAGNOSIS — I129 Hypertensive chronic kidney disease with stage 1 through stage 4 chronic kidney disease, or unspecified chronic kidney disease: Secondary | ICD-10-CM | POA: Diagnosis not present

## 2019-08-10 DIAGNOSIS — L97811 Non-pressure chronic ulcer of other part of right lower leg limited to breakdown of skin: Secondary | ICD-10-CM | POA: Diagnosis not present

## 2019-08-10 DIAGNOSIS — Z4800 Encounter for change or removal of nonsurgical wound dressing: Secondary | ICD-10-CM | POA: Diagnosis not present

## 2019-08-10 DIAGNOSIS — I872 Venous insufficiency (chronic) (peripheral): Secondary | ICD-10-CM | POA: Diagnosis not present

## 2019-08-16 ENCOUNTER — Encounter: Payer: Self-pay | Admitting: Cardiology

## 2019-08-16 NOTE — Progress Notes (Signed)
Remote pacemaker transmission.   

## 2019-08-19 DIAGNOSIS — E1151 Type 2 diabetes mellitus with diabetic peripheral angiopathy without gangrene: Secondary | ICD-10-CM | POA: Diagnosis not present

## 2019-08-19 DIAGNOSIS — E1122 Type 2 diabetes mellitus with diabetic chronic kidney disease: Secondary | ICD-10-CM | POA: Diagnosis not present

## 2019-08-19 DIAGNOSIS — I872 Venous insufficiency (chronic) (peripheral): Secondary | ICD-10-CM | POA: Diagnosis not present

## 2019-08-19 DIAGNOSIS — L97811 Non-pressure chronic ulcer of other part of right lower leg limited to breakdown of skin: Secondary | ICD-10-CM | POA: Diagnosis not present

## 2019-08-19 DIAGNOSIS — I129 Hypertensive chronic kidney disease with stage 1 through stage 4 chronic kidney disease, or unspecified chronic kidney disease: Secondary | ICD-10-CM | POA: Diagnosis not present

## 2019-08-19 DIAGNOSIS — Z4800 Encounter for change or removal of nonsurgical wound dressing: Secondary | ICD-10-CM | POA: Diagnosis not present

## 2019-08-26 DIAGNOSIS — Z4800 Encounter for change or removal of nonsurgical wound dressing: Secondary | ICD-10-CM | POA: Diagnosis not present

## 2019-08-26 DIAGNOSIS — I872 Venous insufficiency (chronic) (peripheral): Secondary | ICD-10-CM | POA: Diagnosis not present

## 2019-08-26 DIAGNOSIS — L97811 Non-pressure chronic ulcer of other part of right lower leg limited to breakdown of skin: Secondary | ICD-10-CM | POA: Diagnosis not present

## 2019-08-26 DIAGNOSIS — E1122 Type 2 diabetes mellitus with diabetic chronic kidney disease: Secondary | ICD-10-CM | POA: Diagnosis not present

## 2019-08-26 DIAGNOSIS — I129 Hypertensive chronic kidney disease with stage 1 through stage 4 chronic kidney disease, or unspecified chronic kidney disease: Secondary | ICD-10-CM | POA: Diagnosis not present

## 2019-08-26 DIAGNOSIS — E1151 Type 2 diabetes mellitus with diabetic peripheral angiopathy without gangrene: Secondary | ICD-10-CM | POA: Diagnosis not present

## 2019-09-06 DIAGNOSIS — I87313 Chronic venous hypertension (idiopathic) with ulcer of bilateral lower extremity: Secondary | ICD-10-CM | POA: Diagnosis not present

## 2019-09-06 DIAGNOSIS — Z4800 Encounter for change or removal of nonsurgical wound dressing: Secondary | ICD-10-CM | POA: Diagnosis not present

## 2019-09-13 DIAGNOSIS — I441 Atrioventricular block, second degree: Secondary | ICD-10-CM | POA: Diagnosis not present

## 2019-09-13 DIAGNOSIS — D631 Anemia in chronic kidney disease: Secondary | ICD-10-CM | POA: Diagnosis not present

## 2019-09-13 DIAGNOSIS — E1151 Type 2 diabetes mellitus with diabetic peripheral angiopathy without gangrene: Secondary | ICD-10-CM | POA: Diagnosis not present

## 2019-09-13 DIAGNOSIS — Z87891 Personal history of nicotine dependence: Secondary | ICD-10-CM | POA: Diagnosis not present

## 2019-09-13 DIAGNOSIS — F5104 Psychophysiologic insomnia: Secondary | ICD-10-CM | POA: Diagnosis not present

## 2019-09-13 DIAGNOSIS — E46 Unspecified protein-calorie malnutrition: Secondary | ICD-10-CM | POA: Diagnosis not present

## 2019-09-13 DIAGNOSIS — J449 Chronic obstructive pulmonary disease, unspecified: Secondary | ICD-10-CM | POA: Diagnosis not present

## 2019-09-13 DIAGNOSIS — I129 Hypertensive chronic kidney disease with stage 1 through stage 4 chronic kidney disease, or unspecified chronic kidney disease: Secondary | ICD-10-CM | POA: Diagnosis not present

## 2019-09-13 DIAGNOSIS — M109 Gout, unspecified: Secondary | ICD-10-CM | POA: Diagnosis not present

## 2019-09-13 DIAGNOSIS — M1612 Unilateral primary osteoarthritis, left hip: Secondary | ICD-10-CM | POA: Diagnosis not present

## 2019-09-13 DIAGNOSIS — E785 Hyperlipidemia, unspecified: Secondary | ICD-10-CM | POA: Diagnosis not present

## 2019-09-13 DIAGNOSIS — K635 Polyp of colon: Secondary | ICD-10-CM | POA: Diagnosis not present

## 2019-09-13 DIAGNOSIS — I4892 Unspecified atrial flutter: Secondary | ICD-10-CM | POA: Diagnosis not present

## 2019-09-13 DIAGNOSIS — F329 Major depressive disorder, single episode, unspecified: Secondary | ICD-10-CM | POA: Diagnosis not present

## 2019-09-13 DIAGNOSIS — N183 Chronic kidney disease, stage 3 (moderate): Secondary | ICD-10-CM | POA: Diagnosis not present

## 2019-09-13 DIAGNOSIS — I87311 Chronic venous hypertension (idiopathic) with ulcer of right lower extremity: Secondary | ICD-10-CM | POA: Diagnosis not present

## 2019-09-13 DIAGNOSIS — L97811 Non-pressure chronic ulcer of other part of right lower leg limited to breakdown of skin: Secondary | ICD-10-CM | POA: Diagnosis not present

## 2019-09-13 DIAGNOSIS — Z853 Personal history of malignant neoplasm of breast: Secondary | ICD-10-CM | POA: Diagnosis not present

## 2019-09-13 DIAGNOSIS — M5137 Other intervertebral disc degeneration, lumbosacral region: Secondary | ICD-10-CM | POA: Diagnosis not present

## 2019-09-13 DIAGNOSIS — Z7901 Long term (current) use of anticoagulants: Secondary | ICD-10-CM | POA: Diagnosis not present

## 2019-09-13 DIAGNOSIS — E559 Vitamin D deficiency, unspecified: Secondary | ICD-10-CM | POA: Diagnosis not present

## 2019-09-13 DIAGNOSIS — Z7984 Long term (current) use of oral hypoglycemic drugs: Secondary | ICD-10-CM | POA: Diagnosis not present

## 2019-09-13 DIAGNOSIS — M810 Age-related osteoporosis without current pathological fracture: Secondary | ICD-10-CM | POA: Diagnosis not present

## 2019-09-13 DIAGNOSIS — G4733 Obstructive sleep apnea (adult) (pediatric): Secondary | ICD-10-CM | POA: Diagnosis not present

## 2019-09-13 DIAGNOSIS — E1122 Type 2 diabetes mellitus with diabetic chronic kidney disease: Secondary | ICD-10-CM | POA: Diagnosis not present

## 2019-09-16 ENCOUNTER — Other Ambulatory Visit: Payer: Self-pay | Admitting: Neurology

## 2019-09-16 MED ORDER — MEMANTINE HCL 10 MG PO TABS: 10.0000 mg | ORAL_TABLET | Freq: Two times a day (BID) | ORAL | 11 refills | Status: DC

## 2019-09-22 ENCOUNTER — Ambulatory Visit: Payer: Medicare Other | Admitting: Neurology

## 2019-09-22 ENCOUNTER — Encounter: Payer: Self-pay | Admitting: Neurology

## 2019-09-23 DIAGNOSIS — N183 Chronic kidney disease, stage 3 (moderate): Secondary | ICD-10-CM | POA: Diagnosis not present

## 2019-09-23 DIAGNOSIS — L97811 Non-pressure chronic ulcer of other part of right lower leg limited to breakdown of skin: Secondary | ICD-10-CM | POA: Diagnosis not present

## 2019-09-23 DIAGNOSIS — I87311 Chronic venous hypertension (idiopathic) with ulcer of right lower extremity: Secondary | ICD-10-CM | POA: Diagnosis not present

## 2019-09-23 DIAGNOSIS — E1151 Type 2 diabetes mellitus with diabetic peripheral angiopathy without gangrene: Secondary | ICD-10-CM | POA: Diagnosis not present

## 2019-09-23 DIAGNOSIS — E1122 Type 2 diabetes mellitus with diabetic chronic kidney disease: Secondary | ICD-10-CM | POA: Diagnosis not present

## 2019-09-23 DIAGNOSIS — I129 Hypertensive chronic kidney disease with stage 1 through stage 4 chronic kidney disease, or unspecified chronic kidney disease: Secondary | ICD-10-CM | POA: Diagnosis not present

## 2019-09-25 DIAGNOSIS — N183 Chronic kidney disease, stage 3 (moderate): Secondary | ICD-10-CM | POA: Diagnosis not present

## 2019-09-25 DIAGNOSIS — I129 Hypertensive chronic kidney disease with stage 1 through stage 4 chronic kidney disease, or unspecified chronic kidney disease: Secondary | ICD-10-CM | POA: Diagnosis not present

## 2019-09-25 DIAGNOSIS — I87311 Chronic venous hypertension (idiopathic) with ulcer of right lower extremity: Secondary | ICD-10-CM | POA: Diagnosis not present

## 2019-09-25 DIAGNOSIS — E1122 Type 2 diabetes mellitus with diabetic chronic kidney disease: Secondary | ICD-10-CM | POA: Diagnosis not present

## 2019-09-25 DIAGNOSIS — L97811 Non-pressure chronic ulcer of other part of right lower leg limited to breakdown of skin: Secondary | ICD-10-CM | POA: Diagnosis not present

## 2019-09-25 DIAGNOSIS — E1151 Type 2 diabetes mellitus with diabetic peripheral angiopathy without gangrene: Secondary | ICD-10-CM | POA: Diagnosis not present

## 2019-09-27 DIAGNOSIS — E1151 Type 2 diabetes mellitus with diabetic peripheral angiopathy without gangrene: Secondary | ICD-10-CM | POA: Diagnosis not present

## 2019-09-27 DIAGNOSIS — I129 Hypertensive chronic kidney disease with stage 1 through stage 4 chronic kidney disease, or unspecified chronic kidney disease: Secondary | ICD-10-CM | POA: Diagnosis not present

## 2019-09-27 DIAGNOSIS — I87311 Chronic venous hypertension (idiopathic) with ulcer of right lower extremity: Secondary | ICD-10-CM | POA: Diagnosis not present

## 2019-09-27 DIAGNOSIS — N183 Chronic kidney disease, stage 3 (moderate): Secondary | ICD-10-CM | POA: Diagnosis not present

## 2019-09-27 DIAGNOSIS — L97811 Non-pressure chronic ulcer of other part of right lower leg limited to breakdown of skin: Secondary | ICD-10-CM | POA: Diagnosis not present

## 2019-09-27 DIAGNOSIS — E1122 Type 2 diabetes mellitus with diabetic chronic kidney disease: Secondary | ICD-10-CM | POA: Diagnosis not present

## 2019-09-30 DIAGNOSIS — E1122 Type 2 diabetes mellitus with diabetic chronic kidney disease: Secondary | ICD-10-CM | POA: Diagnosis not present

## 2019-09-30 DIAGNOSIS — I87311 Chronic venous hypertension (idiopathic) with ulcer of right lower extremity: Secondary | ICD-10-CM | POA: Diagnosis not present

## 2019-09-30 DIAGNOSIS — L97811 Non-pressure chronic ulcer of other part of right lower leg limited to breakdown of skin: Secondary | ICD-10-CM | POA: Diagnosis not present

## 2019-09-30 DIAGNOSIS — E1151 Type 2 diabetes mellitus with diabetic peripheral angiopathy without gangrene: Secondary | ICD-10-CM | POA: Diagnosis not present

## 2019-09-30 DIAGNOSIS — N183 Chronic kidney disease, stage 3 (moderate): Secondary | ICD-10-CM | POA: Diagnosis not present

## 2019-09-30 DIAGNOSIS — I129 Hypertensive chronic kidney disease with stage 1 through stage 4 chronic kidney disease, or unspecified chronic kidney disease: Secondary | ICD-10-CM | POA: Diagnosis not present

## 2019-10-04 DIAGNOSIS — I129 Hypertensive chronic kidney disease with stage 1 through stage 4 chronic kidney disease, or unspecified chronic kidney disease: Secondary | ICD-10-CM | POA: Diagnosis not present

## 2019-10-04 DIAGNOSIS — N183 Chronic kidney disease, stage 3 (moderate): Secondary | ICD-10-CM | POA: Diagnosis not present

## 2019-10-04 DIAGNOSIS — I87311 Chronic venous hypertension (idiopathic) with ulcer of right lower extremity: Secondary | ICD-10-CM | POA: Diagnosis not present

## 2019-10-04 DIAGNOSIS — E1151 Type 2 diabetes mellitus with diabetic peripheral angiopathy without gangrene: Secondary | ICD-10-CM | POA: Diagnosis not present

## 2019-10-04 DIAGNOSIS — E1122 Type 2 diabetes mellitus with diabetic chronic kidney disease: Secondary | ICD-10-CM | POA: Diagnosis not present

## 2019-10-04 DIAGNOSIS — L97811 Non-pressure chronic ulcer of other part of right lower leg limited to breakdown of skin: Secondary | ICD-10-CM | POA: Diagnosis not present

## 2019-10-05 ENCOUNTER — Ambulatory Visit (INDEPENDENT_AMBULATORY_CARE_PROVIDER_SITE_OTHER): Payer: Medicare Other | Admitting: Neurology

## 2019-10-05 ENCOUNTER — Other Ambulatory Visit: Payer: Self-pay

## 2019-10-05 ENCOUNTER — Encounter: Payer: Self-pay | Admitting: Neurology

## 2019-10-05 VITALS — BP 158/79 | HR 62 | Temp 98.3°F | Ht 65.0 in | Wt 121.0 lb

## 2019-10-05 DIAGNOSIS — Z950 Presence of cardiac pacemaker: Secondary | ICD-10-CM | POA: Diagnosis not present

## 2019-10-05 DIAGNOSIS — Z794 Long term (current) use of insulin: Secondary | ICD-10-CM | POA: Insufficient documentation

## 2019-10-05 DIAGNOSIS — I5032 Chronic diastolic (congestive) heart failure: Secondary | ICD-10-CM | POA: Diagnosis not present

## 2019-10-05 DIAGNOSIS — E1151 Type 2 diabetes mellitus with diabetic peripheral angiopathy without gangrene: Secondary | ICD-10-CM | POA: Insufficient documentation

## 2019-10-05 DIAGNOSIS — I483 Typical atrial flutter: Secondary | ICD-10-CM

## 2019-10-05 DIAGNOSIS — I4891 Unspecified atrial fibrillation: Secondary | ICD-10-CM

## 2019-10-05 DIAGNOSIS — I6521 Occlusion and stenosis of right carotid artery: Secondary | ICD-10-CM

## 2019-10-05 DIAGNOSIS — I209 Angina pectoris, unspecified: Secondary | ICD-10-CM

## 2019-10-05 DIAGNOSIS — I739 Peripheral vascular disease, unspecified: Secondary | ICD-10-CM | POA: Diagnosis not present

## 2019-10-05 NOTE — Progress Notes (Signed)
PATIENT: Donna Owens DOB: 07-09-1928  REASON FOR VISIT: follow up HISTORY FROM: patient  HISTORY OF PRESENT ILLNESS: Alzheimer's type dementia , paranoid.  RV : 10/05/19:  Medications are  not taken as [rescribed - the pt states that she can't find them and thinks " someone keeps moving them or taking them."            Revisit for memory decline- 10-05-2019. The patient is defiant, a little irritated. Her daughter has been moving to her mothers home and helping with care, housekeeping. The patient is an 85 year old african -[place] female with paranoid ideas.  She forgets information, repeatedly asking the same questions and making the same comments, and misplaces things, and feels strongly that someone is doing it " to her".   She is quite frustrated is blaming a granddaughter to break into her house, suspecting her to have a copy of her keys. Medication is regularly misplaced. Will need a pill p box- she is losing weight and needs food prepared for her.   Interval History for this 85 year old african [place] female, who presents with cognitive decline and falls.  She fall s over "stuff", she walks with a cane, gait is more unsteady. Overall  Low muscle mass, clothes have gotten too big for her. She drinks not enough, has trouble to tise form a seated position and goes each night 2-3 times . She naps throughout the day. Her daughter who is here with her reported.   MM: 09-15-2017 Donna Owens is an 85 year old female with a history of memory disturbance.  She returns today for follow-up.  She is currently lives at home alone.  She is on Namenda 5 mg twice a day.  She feels that her memory has remained the same.  She is able to complete all ADLs independently.  She does do some simple cooking.  Daughter notes that she had burn food but this is not consistent.  She manages her own finances.  She manages her own medications.  Her daughter helps her with appointment.  She does note that she  does not sleep very well.  She states most nights it takes her a while to fall asleep.  She returns today for an evaluation.  HISTORY  03/19/18 Donna Owens is an 85 year old female with a history of memory disturbance.  She returns today for follow-up.  She states that since last visit she had a fall in October and went to the hospital.  She was discharged [facility] and is now living in [place] with her daughter.  He reports that they plan to move to her home in [place].  Reports that her daughter will be living with her.  She is able to complete all ADLs independently.  She does not operate a motor vehicle.  She reports that her sleep is fragmented.  Reports that she was given a prescription of gabapentin but she does not take it consistently.  She reports a good appetite.  She manages her own finances.  She now uses a cane when ambulating.  Denies any falls since October.  She is currently not on any memory medication.  She has noticed that her memory is worse since her fall.  She returns today for evaluation.   REVIEW OF SYSTEMS: Out of a complete 14 system review of symptoms, the patient complains only of the following symptoms, and all other reviewed systems are negative.  See HPI MMSE - Mini Mental State Exam 10/05/2019  07/14/2019 09/23/2018 03/19/2018 09/15/2017 11/27/2016 08/12/2016  Not completed: - - (No Data) - - - -  Orientation to time 4 4 4 4 4 5 5   Orientation to Place 4 3 4 2 4 5 5   Registration 3 3 3 3 3 3 3   Attention/ Calculation 0 0 0 0 4 5 2   Attention/Calculation-comments - - - - couldn do numbers - -  Recall 0 0 2 1 1  0 1  Language- name 2 objects 2 1 2 2 2 2 2   Language- repeat 1 1 1 1 1 1  0  Language- follow 3 step command 3 2 3 1 3 2 3   Language- read & follow direction 0 1 1 1 1 1 1   Write a sentence 1 0 1 1 1 1 1   Copy design 0 0 0 0 0 1 0  Total score 18 15 21 16 24 26 23       Shuffling gait , unable to walk unassisted, holding on to my elbow-   ALLERGIES:  No Known Allergies  HOME MEDICATIONS: Outpatient Medications Prior to Visit  Medication Sig Dispense Refill  . acetaminophen (TYLENOL) 325 MG tablet Take 325 mg by mouth every 6 (six) hours as needed for moderate pain or headache.     . Calcium Carbonate-Vitamin D (CALCIUM-VITAMIN D3 PO) Take 1 tablet by mouth daily.    . Cholecalciferol (VITAMIN D-3) 5000 units TABS Take 5,000 Units by mouth daily.     . Coenzyme Q10 (CO Q 10) 100 MG CAPS Take 100 mg by mouth daily.     . Cyanocobalamin (VITAMIN B-12) 2500 MCG SUBL Place 2,500 mcg under the tongue daily.    Marland Kitchen doxycycline (VIBRA-TABS) 100 MG tablet     . ELIQUIS 2.5 MG TABS tablet TAKE 1 TABLET(2.5 MG) BY MOUTH TWICE DAILY 180 tablet 2  . ferrous sulfate 325 (65 FE) MG tablet Take 325 mg by mouth daily.   12  . furosemide (LASIX) 20 MG tablet Take 0.5 tablets (10 mg total) by mouth every Monday, Wednesday, and Friday. 30 tablet   . latanoprost (XALATAN) 0.005 % ophthalmic solution     . memantine (NAMENDA) 10 MG tablet Take 1 tablet (10 mg total) by mouth 2 (two) times daily. 60 tablet 11  . metFORMIN (GLUCOPHAGE) 500 MG tablet TAKE 1 TABLET BY MOUTH DAILY 30 tablet 9  . olmesartan-hydrochlorothiazide (BENICAR HCT) 20-12.5 MG tablet     . omega-3 acid ethyl esters (LOVAZA) 1 g capsule Take 1 g by mouth daily.    Donna Owens Glycol-Propyl Glycol (SYSTANE) 0.4-0.3 % SOLN Place 2 drops into both eyes every 8 (eight) hours as needed (for dry eyes).     . pravastatin (PRAVACHOL) 40 MG tablet Take 40 mg by mouth daily.    . traMADol (ULTRAM) 50 MG tablet     . amLODipine (NORVASC) 5 MG tablet     . divalproex (DEPAKOTE SPRINKLE) 125 MG capsule Take 125 mg by mouth daily.    Marland Kitchen doxepin (SINEQUAN) 10 MG capsule TK 1 C PO HS FOR SLP    . FLUoxetine (PROZAC) 10 MG capsule     . gabapentin (NEURONTIN) 100 MG capsule Take 1 capsule (100 mg total) by mouth at bedtime. (Patient not taking: Reported on 10/05/2019) 90 capsule 3  . MYRBETRIQ 50 MG TB24  tablet TK 1 T PO QD     No facility-administered medications prior to visit.     PAST MEDICAL HISTORY: Past Medical History:  Diagnosis Date  . Anemia   . Anxiety   . Arthritis    "back and right knee" (07/08/2018)  . Atrophic vaginitis   . Breast cancer, right breast ([place]) 2001  . Carotid arterial disease ([place]) 12/13/11    mild by doppler  . Chronic lower back pain   . CVA (cerebral vascular accident) [facility])    "dr never told me I had one" (07/08/2018)  . Depression   . Hyperlipemia   . Hypertension   . Migraine    "1-2/year" (07/08/2018)  . OSA (obstructive sleep apnea)   . OSA on CPAP 06/20/2014  . Osteoporosis   . Presence of permanent cardiac pacemaker 07/08/2018  . Pulmonary hypertension ([place])    moderate echo 11/07/10  . PVD (peripheral vascular disease) ([place])   . Sleep apnea   . Type II diabetes mellitus ([place])     PAST SURGICAL HISTORY: Past Surgical History:  Procedure Laterality Date  . BREAST LUMPECTOMY Right 07/26/2000   chemo/radiation  . BREAST LUMPECTOMY Left    benign  . CARDIOVERSION N/A 07/21/2014   Procedure: CARDIOVERSION;  Surgeon: Sanda Klein, MD;  Location: [facility] END[facility];  Service: Cardiovascular;  Laterality: N/A;  . CATARACT EXTRACTION W/ INTRAOCULAR LENS  IMPLANT, BILATERAL Bilateral    "not sure about left eye; ?lens"  . COLONOSCOPY  2010  . INSERT / REPLACE / REMOVE PACEMAKER  07/08/2018  . PACEMAKER IMPLANT N/A 07/08/2018   Procedure: PACEMAKER IMPLANT;  Surgeon: Sanda Klein, MD;  Location: [facility] CV LAB;  Service: Cardiovascular;  Laterality: N/A;  . TOENAIL EXCISION  03/2013   ingrown nail    FAMILY HISTORY: Family History  Problem Relation Age of Onset  . Diabetes Brother   . Hypertension Brother   . Heart failure Daughter 87  . Diabetes Other     SOCIAL HISTORY: Social History   Socioeconomic History  . Marital status: Divorced    Spouse name: Not on file  . Number of children: 1  . Years of education: BSN  .  Highest education level: Not on file  Occupational History  . Occupation: RETIRED    Employer: RETIRED  Social Needs  . Financial resource strain: Not on file  . Food insecurity    Worry: Not on file    Inability: Not on file  . Transportation needs    Medical: Not on file    Non-medical: Not on file  Tobacco Use  . Smoking status: Never Smoker  . Smokeless tobacco: Never Used  Substance and Sexual Activity  . Alcohol use: Never    Frequency: Never  . Drug use: Never  . Sexual activity: Not on file  Lifestyle  . Physical activity    Days per week: Not on file    Minutes per session: Not on file  . Stress: Not on file  Relationships  . Social Herbalist on phone: Not on file    Gets together: Not on file    Attends religious service: Not on file    Active member of club or organization: Not on file    Attends meetings of clubs or organizations: Not on file    Relationship status: Not on file  . Intimate partner violence    Fear of current or ex partner: Not on file    Emotionally abused: Not on file    Physically abused: Not on file    Forced sexual activity: Not on file  Other Topics Concern  . Not  on file  Social History Narrative   Patient is divorced and lives alone.   Patient has one adult daughter.   Patient is retired.   Patient has a BSN degree in nursing.   Patient is right-handed.   Patient drinks one soda per week.      PHYSICAL EXAM  Vitals:   10/05/19 1418  BP: (!) 158/79  Pulse: 62  Temp: 98.3 F (36.8 C)  Weight: 121 lb (54.9 kg)  Height: 5\' 5"  (1.651 m)   Body mass index is 20.14 kg/m.   MMSE - Mini Mental State Exam 10/05/2019 07/14/2019 09/23/2018  Not completed: - - (No Data)  Orientation to time 4 4 4   Orientation to Place 4 3 4   Registration 3 3 3   Attention/ Calculation 0 0 0  Attention/Calculation-comments - - -  Recall 0 0 2  Language- name 2 objects 2 1 2   Language- repeat 1 1 1   Language- follow 3 step command  3 2 3   Language- read & follow direction 0 1 1  Write a sentence 1 0 1  Copy design 0 0 0  Total score 18 15 21      Generalized: Well developed, in no acute distress, in leisure clothes.    Neurological examination  Mentation: Alert oriented to time, place, history taking. hearing impairment affects communication.  Clearly demented by MMSE.  Cranial nerve : denies loss of smell and taste.   Extraocular movements were full, visual field were full on confrontational test. Facial sensation and strength were normal. Uvula and tongue midline. Head turning and shoulder shrug  were restricited. Stooped posture.   Clearly atrophy and mass loss over the quadriceps, stooped posture.  Walks with her feet pointing outwards.  Shuffling gait, turning with 5 -6 steps. Holding on to the wall. Walks better when leaning onto an object like a shopping cart.    trophy of muscles on all 4 extremities. Coordination: Cerebellar testing reveals good finger-nose-finger there is no heel to shin possible.   Reflexes: Deep tendon reflexes could be elicited in patella and achilles tendon.   DIAGNOSTIC DATA (LABS, IMAGING, TESTING) - I reviewed patient records, labs, notes, testing and imaging myself where available.  Pacemaker was implanted 07-08-2018 at [facility].  Decreasing GFR. HTN poorly controlled.    Increasing paranoia in Alzheimer's disease.   Lab Results  Component Value Date   WBC 5.3 06/04/2019   HGB 12.1 06/04/2019   HCT 36.7 06/04/2019   MCV 89 06/04/2019   PLT 146 (L) 06/04/2019      Component Value Date/Time   NA 139 06/04/2019 1145   K 4.4 06/04/2019 1145   CL 101 06/04/2019 1145   CO2 24 06/04/2019 1145   GLUCOSE 88 06/04/2019 1145   GLUCOSE 119 (H) 10/17/2017 0631   BUN 32 (H) 06/04/2019 1145   CREATININE 1.55 (H) 06/04/2019 1145   CREATININE 1.23 (H) 10/05/2015 1151   CALCIUM 10.2 06/04/2019 1145   PROT 7.5 06/04/2019 1145   ALBUMIN 4.3  06/04/2019 1145   AST 20 06/04/2019 1145   ALT 9 06/04/2019 1145   ALKPHOS 64 06/04/2019 1145   BILITOT 0.6 06/04/2019 1145   GFRNONAA 29 (L) 06/04/2019 1145   GFRNONAA 45 (L) 06/29/2014 1607   GFRAA 34 (L) 06/04/2019 1145   GFRAA 52 (L) 06/29/2014 1607   Lab Results  Component Value Date   CHOL 163 06/04/2019   HDL 74 06/04/2019   LDLCALC 80 06/04/2019  TRIG 46 06/04/2019   CHOLHDL 2.2 06/04/2019   Lab Results  Component Value Date   HGBA1C 5.4 02/02/2014   Lab Results  Component Value Date   VITAMINB12 3,005 (H) 10/13/2017   Lab Results  Component Value Date   TSH 1.630 06/04/2019      ASSESSMENT AND PLAN 85 y.o. year old female  has a past medical history of Anemia, Anxiety, Arthritis, Atrophic vaginitis, Breast cancer, right breast ([place]) (2001), Carotid arterial disease ([place]) (12/13/11), Chronic lower back pain, CVA (cerebral vascular accident) ([place]), Depression, Hyperlipemia, Hypertension, Migraine, Osteoporosis, Presence of permanent cardiac pacemaker (07/08/2018), Pulmonary hypertension ([place]), PVD (peripheral vascular disease) ([place]), and Type II diabetes mellitus ([place]).  CKD 3 . here with :  1. Memory loss progressive in the setting of CKD grade 3. Alzheimer's demeentia with short term ememory impairment and with paranoid ideas. She has forgotten medication and misplaced medication.  Meal s are also skipped, the patient has lost weight.   10-05-2019 The patient's memory score has decreased significantly -  She will continue on Namenda 5 mg twice a day. I will increase to 10 mg bid .   She is currently living at home alone- her daughter will move in next week end.  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 She will in October move in with her daughter.  She could benefit from a lift chair.   She is not using her walker, she forgets how to use it and where she placed it .     Advised that if her symptoms worsen or she develops new  symptoms she should let us know.  She will follow-up in 6 months or sooner if needed.  I spent 25 minutes with the patient. 50% of this time was spent reviewing her memory score  Rv with NP in 6 month.    10/05/2019, 2:46 PM [facility] [address], [place], [place] [address] [phone]

## 2019-10-05 NOTE — Patient Instructions (Signed)
Living With Alzheimer Disease Alzheimer disease is a brain disease that makes you forget things that you used to know. It also makes it hard to pay attention, communicate, and do routine tasks. Alzheimer disease gets worse over time. At the start of the disease, you may be able to take care of yourself, but you will eventually need someone to help care for you. How to cope with changes It is normal to have many emotions about this condition, such as fear, sadness, anger, and loss. Here are some ways to help yourself cope with these emotions:  Accept your emotions.  Write down your thoughts and feelings in a journal.  Build a supportive group of friends and family to help you.  Join a support group for people with Alzheimer disease. The changes caused by Alzheimer disease can be frightening and confusing. Here are some things you can do to make adjusting to these changes a little easier.  Keep a daily routine.  Keep a calendar in a central location. Write down your appointments and activities on the calendar.  Keep a list of things you need to do.  Focus on one task at a time.  Organize medicines in a pillbox for each day of the week.  Accept that some things may need to change for your safety, such as driving.  Accept help from others. Do not be ashamed if you need help with certain tasks.  Think about what is most stressful for you. Then find ways to change or avoid these things if possible. Ask for help from others to help relieve stress.  Create a plan for any legal or financial actions that are needed. Get professional advice if you are not sure what should be done. How to recognize stress It is normal to feel stressed from time to time. Here are some signs that you are feeling stressed:  Avoiding contact with other people.  Anger or frustration.  Denying that you have the disease.  Trouble sleeping.  Trouble concentrating.  Irritability.  Anxiety.  Depression.   Developing other health problems. Follow these instructions at home:  Create a bedtime and sleeping routine that includes: ? Sleeping in a cool bedroom. ? Using darkening shades. ? No physical activity or eating for a few hours before bedtime.  Get regular exercise.  Use safety devices, such as a cane or walker, if you have trouble balancing.  Have a safety plan for emergencies. Consider using a safety alert system that allows you to get help quickly.  Take steps to manage your stress. Try any of the following: ? Listen to music. ? Spend time with others. ? Talk about how you are feeling. ? Do creative artwork. ? Do meditation and deep breathing exercises.  Avoid caffeine and alcohol.  Take over-the-counter and prescription medicines only as told by your health care provider. Where to find support  Friends and family.  Your place of worship.  Counselors or therapists.  Support groups.  Home health care services. Where to find more information Alzheimer's Association: [employer] Contact a health care provider if:  You are unable to care for yourself.  You feel that you are in danger. Get help right away if:  You have feelings of harming yourself.  You feel depressed. If you ever feel like you may hurt yourself or others, or have thoughts about taking your own life, get help right away. You can go to your nearest emergency department or call:  Your local emergency services (911 in  the [place]  A suicide crisis helpline, such as the [facility] at 442-777-4374. This is open 24 hours a day. Summary  Alzheimer disease is a brain disease that makes you forget things that you used to know. It also makes it hard to pay attention, communicate, and do routine tasks.  Alzheimer disease gets worse over time. At the start of the disease, you may be able to take care of yourself, but you will eventually need someone to help care for you.  The  changes caused by Alzheimer disease can be frightening and confusing. You may need to make changes in your daily routine. Build a supportive group of friends and family to help you. This information is not intended to replace advice given to you by your health care provider. Make sure you discuss any questions you have with your health care provider. Document Released: 11/28/2016 Document Revised: 04/02/2019 Document Reviewed: 11/28/2016 Elsevier Patient Education  2020 [place] Disease Caregiver Guide  Alzheimer disease causes a person to lose the ability to remember things and make decisions. A person who has Alzheimer disease may not be able to take care of himself or herself. He or she may need help with simple tasks. The tips below can help you care for the person. What kind of changes does this condition cause? This condition makes a person:  Forget things.  Feel confused.  Act differently.  Have different moods. These things get worse with time. Tips to help with symptoms  Be calm and patient.  Respond with a simple, short answer.  Avoid correcting the person in a negative way.  Try not to take things personally, even if the person forgets your name.  Do not argue with the person. This may make the person more upset. Tips to lessen frustration  Make appointments and do daily tasks when the person is at his or her best.  Take your time. Simple tasks may take longer. Allow plenty of time to complete tasks.  Limit choices for the person.  Involve the person in what you are doing.  Keep a daily routine.  Avoid new or crowded places, if possible.  Use simple words, short sentences, and a calm voice. Only give one direction at a time.  Buy clothes and shoes that are easy to put on and take off.  Organize medicines in a pillbox for each day of the week.  Keep a calendar in a central location to remind the person of meetings or other activities.  Let  people help if they offer. Take a break when needed. Tips to prevent injury  Keep floors clear. Remove rugs, magazine racks, and floor lamps.  Keep hallways well-lit.  Put a handrail and non-slip mat in the bathtub or shower.  Put childproof locks on cabinets that have dangerous items in them. These items include medicine, alcohol, guns, toxic cleaning items, sharp tools, matches, and lighters.  Put locks on doors where the person cannot see or reach them. This helps the person to not wander out of the house and get lost.  Be prepared for emergencies. Keep a list of emergency phone numbers and addresses close by.  Bracelets may be worn that track location and identify the person as having memory problems. This should be worn at all times for safety. Tips for the future  Discuss financial and legal planning early. People with this disease have trouble managing their money as the disease gets worse. Get help from a professional.  Talk about advance directives, safety, and daily care. Take these steps: ? Create a living will and choose a power of attorney. This is someone who can make decisions for the person with Alzheimer disease when he or she can no longer do so. ? Discuss driving safety and when to stop driving. The person's doctor can help with this. ? If the person lives alone, make sure he or she is safe. Some people need extra help at home. Other people need more care at a nursing home or care center. Where to find support You can find support by joining a support group near you. Some benefits of joining a support group include:  Learning ways to manage stress.  Sharing experiences with others.  Getting emotional comfort and support.  Learning about caregiving as the disease progresses.  Knowing what community resources are available and making use of them. Where to find more information  Alzheimer's Association: [employer] Contact a doctor if:  The person has a fever.   The person has a sudden behavior change that does not get better with calming strategies.  The person is not able to take care of himself or herself at home.  The person threatens you or anyone else, including himself or herself.  You are no longer able to care for the person. Summary  Alzheimer disease causes a person to forget things and to be confused.  A person who has this condition may not be able to take care of himself or herself.  Take steps to keep the person from getting hurt. Plan for future care.  You can find support by joining a support group near you. This information is not intended to replace advice given to you by your health care provider. Make sure you discuss any questions you have with your health care provider. Document Released: 03/02/2012 Document Revised: 03/30/2019 Document Reviewed: 12/04/2017 Elsevier Patient Education  2020 [employer].

## 2019-10-07 DIAGNOSIS — L97811 Non-pressure chronic ulcer of other part of right lower leg limited to breakdown of skin: Secondary | ICD-10-CM | POA: Diagnosis not present

## 2019-10-07 DIAGNOSIS — I129 Hypertensive chronic kidney disease with stage 1 through stage 4 chronic kidney disease, or unspecified chronic kidney disease: Secondary | ICD-10-CM | POA: Diagnosis not present

## 2019-10-07 DIAGNOSIS — E1151 Type 2 diabetes mellitus with diabetic peripheral angiopathy without gangrene: Secondary | ICD-10-CM | POA: Diagnosis not present

## 2019-10-07 DIAGNOSIS — I87311 Chronic venous hypertension (idiopathic) with ulcer of right lower extremity: Secondary | ICD-10-CM | POA: Diagnosis not present

## 2019-10-07 DIAGNOSIS — E1122 Type 2 diabetes mellitus with diabetic chronic kidney disease: Secondary | ICD-10-CM | POA: Diagnosis not present

## 2019-10-07 DIAGNOSIS — N183 Chronic kidney disease, stage 3 (moderate): Secondary | ICD-10-CM | POA: Diagnosis not present

## 2019-10-08 DIAGNOSIS — Z23 Encounter for immunization: Secondary | ICD-10-CM | POA: Diagnosis not present

## 2019-10-11 DIAGNOSIS — I129 Hypertensive chronic kidney disease with stage 1 through stage 4 chronic kidney disease, or unspecified chronic kidney disease: Secondary | ICD-10-CM | POA: Diagnosis not present

## 2019-10-11 DIAGNOSIS — E1151 Type 2 diabetes mellitus with diabetic peripheral angiopathy without gangrene: Secondary | ICD-10-CM | POA: Diagnosis not present

## 2019-10-11 DIAGNOSIS — L97811 Non-pressure chronic ulcer of other part of right lower leg limited to breakdown of skin: Secondary | ICD-10-CM | POA: Diagnosis not present

## 2019-10-11 DIAGNOSIS — N183 Chronic kidney disease, stage 3 (moderate): Secondary | ICD-10-CM | POA: Diagnosis not present

## 2019-10-11 DIAGNOSIS — E1122 Type 2 diabetes mellitus with diabetic chronic kidney disease: Secondary | ICD-10-CM | POA: Diagnosis not present

## 2019-10-11 DIAGNOSIS — I87311 Chronic venous hypertension (idiopathic) with ulcer of right lower extremity: Secondary | ICD-10-CM | POA: Diagnosis not present

## 2019-10-19 ENCOUNTER — Emergency Department (HOSPITAL_COMMUNITY): Payer: Medicare Other

## 2019-10-19 ENCOUNTER — Emergency Department (HOSPITAL_COMMUNITY)
Admission: EM | Admit: 2019-10-19 | Discharge: 2019-10-20 | Disposition: A | Discharge status: Home / Self Care | Payer: Medicare Other | Attending: Emergency Medicine | Admitting: Emergency Medicine

## 2019-10-19 ENCOUNTER — Other Ambulatory Visit: Payer: Self-pay

## 2019-10-19 ENCOUNTER — Encounter (HOSPITAL_COMMUNITY): Payer: Self-pay

## 2019-10-19 DIAGNOSIS — S199XXA Unspecified injury of neck, initial encounter: Secondary | ICD-10-CM | POA: Diagnosis not present

## 2019-10-19 DIAGNOSIS — R4789 Other speech disturbances: Secondary | ICD-10-CM | POA: Insufficient documentation

## 2019-10-19 DIAGNOSIS — Z5321 Procedure and treatment not carried out due to patient leaving prior to being seen by health care provider: Secondary | ICD-10-CM | POA: Diagnosis not present

## 2019-10-19 DIAGNOSIS — R519 Headache, unspecified: Secondary | ICD-10-CM | POA: Diagnosis not present

## 2019-10-19 DIAGNOSIS — S0990XA Unspecified injury of head, initial encounter: Secondary | ICD-10-CM | POA: Diagnosis not present

## 2019-10-19 DIAGNOSIS — M542 Cervicalgia: Secondary | ICD-10-CM | POA: Diagnosis not present

## 2019-10-19 NOTE — ED Triage Notes (Signed)
Patient's caregiver states the patient experienced a fall two weeks ago onto an acrylic floor when the chair behind her moved. The patient has had neck pain and stuttering speech since the incident.

## 2019-10-19 NOTE — ED Notes (Signed)
Patient states she is tired and going home as soon as her daughter gets here she is leaving

## 2019-10-25 DIAGNOSIS — N952 Postmenopausal atrophic vaginitis: Secondary | ICD-10-CM | POA: Diagnosis not present

## 2019-10-25 DIAGNOSIS — R350 Frequency of micturition: Secondary | ICD-10-CM | POA: Diagnosis not present

## 2019-10-25 DIAGNOSIS — R351 Nocturia: Secondary | ICD-10-CM | POA: Diagnosis not present

## 2019-11-01 ENCOUNTER — Other Ambulatory Visit: Payer: Self-pay

## 2019-11-01 MED ORDER — AMLODIPINE BESYLATE 5 MG PO TABS: 5.0000 mg | ORAL_TABLET | Freq: Every day | ORAL | 0 refills | Status: DC

## 2019-11-03 LAB — CUP PACEART REMOTE DEVICE CHECK
Battery Remaining Longevity: 121 mo
Battery Remaining Percentage: 95.5 %
Battery Voltage: 3.02 V
Brady Statistic RV Percent Paced: 94 %
Date Time Interrogation Session: 20201111070012
Implantable Lead Implant Date: 20190717
Implantable Lead Location: 753860
Implantable Pulse Generator Implant Date: 20190717
Lead Channel Impedance Value: 460 Ohm
Lead Channel Pacing Threshold Amplitude: 0.5 V
Lead Channel Pacing Threshold Pulse Width: 0.5 ms
Lead Channel Sensing Intrinsic Amplitude: 12 mV
Lead Channel Setting Pacing Amplitude: 2.5 V
Lead Channel Setting Pacing Pulse Width: 0.5 ms
Lead Channel Setting Sensing Sensitivity: 2 mV
Pulse Gen Model: 1272
Pulse Gen Serial Number: 9002800

## 2019-11-04 ENCOUNTER — Ambulatory Visit (INDEPENDENT_AMBULATORY_CARE_PROVIDER_SITE_OTHER): Payer: Medicare Other | Admitting: *Deleted

## 2019-11-04 DIAGNOSIS — I5032 Chronic diastolic (congestive) heart failure: Secondary | ICD-10-CM

## 2019-11-04 DIAGNOSIS — I4891 Unspecified atrial fibrillation: Secondary | ICD-10-CM

## 2019-11-11 ENCOUNTER — Encounter (INDEPENDENT_AMBULATORY_CARE_PROVIDER_SITE_OTHER): Payer: Medicare Other | Admitting: Ophthalmology

## 2019-11-11 DIAGNOSIS — I10 Essential (primary) hypertension: Secondary | ICD-10-CM

## 2019-11-11 DIAGNOSIS — H35033 Hypertensive retinopathy, bilateral: Secondary | ICD-10-CM

## 2019-11-11 DIAGNOSIS — E113393 Type 2 diabetes mellitus with moderate nonproliferative diabetic retinopathy without macular edema, bilateral: Secondary | ICD-10-CM

## 2019-11-11 DIAGNOSIS — E11319 Type 2 diabetes mellitus with unspecified diabetic retinopathy without macular edema: Secondary | ICD-10-CM

## 2019-11-11 DIAGNOSIS — H43813 Vitreous degeneration, bilateral: Secondary | ICD-10-CM | POA: Diagnosis not present

## 2019-11-26 NOTE — Progress Notes (Signed)
Remote pacemaker transmission.   

## 2019-11-30 IMAGING — CT CT HEAD W/O CM
3 series · 14 of 37 positions shown, 16 images · non-contrast
Comparison: 06/04/2019

CLINICAL DATA: Fall 2 weeks ago with persistent headaches and neck
pain, initial encounter

EXAM:
CT HEAD WITHOUT CONTRAST
CT CERVICAL SPINE WITHOUT CONTRAST
TECHNIQUE: Multidetector CT imaging of the head and cervical spine was
performed following the standard protocol without intravenous
contrast. Multiplanar CT image reconstructions of the cervical spine
were also generated.

[Series 3: head wo · axial · 0.34mm/px · z∈[+1146,+1256]mm · 7 of 33 slices shown, 9 images]
[im 5/33  brain]
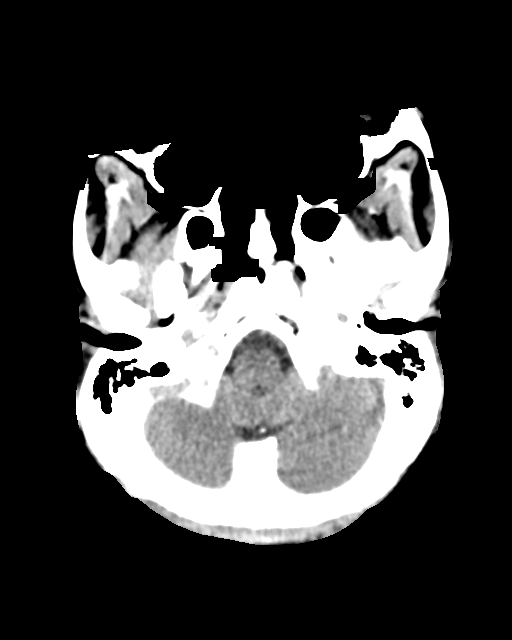
[im 5/33  bone]
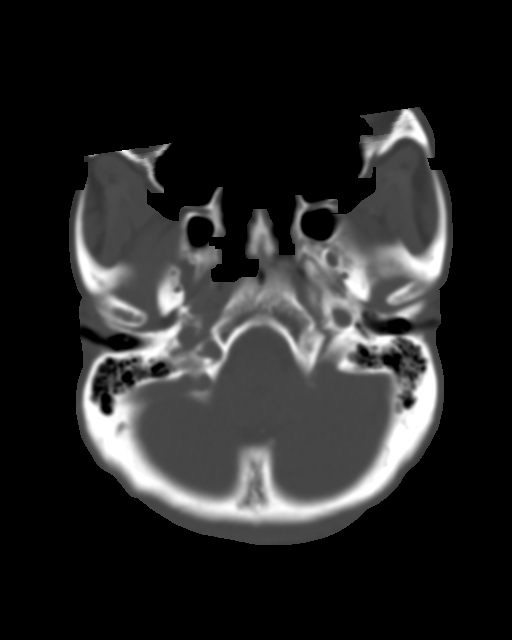
[im 9/33  brain]
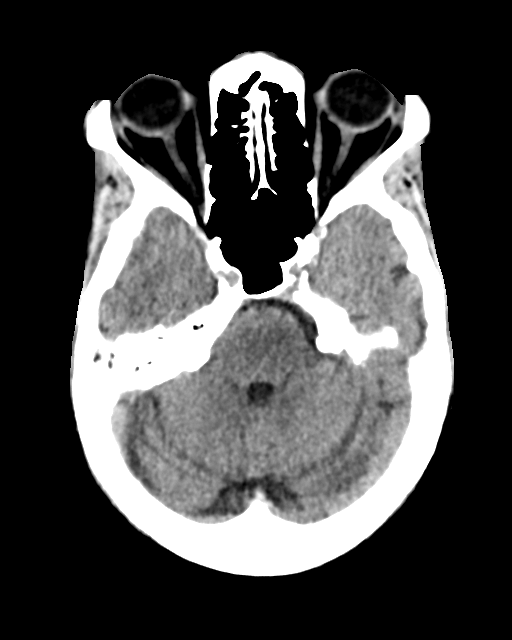
[im 13/33  brain]
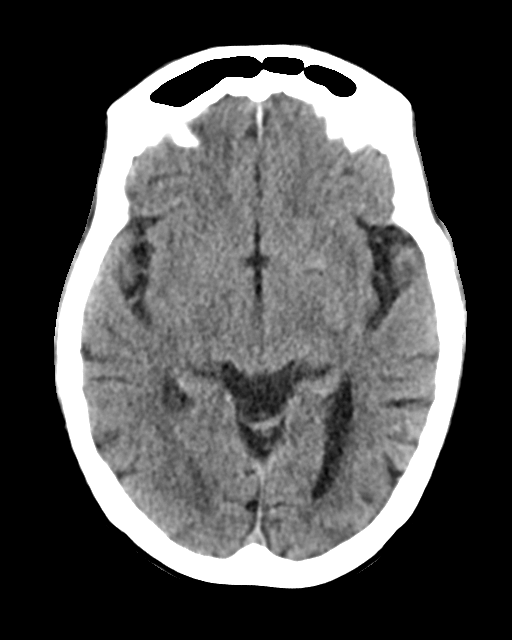
[im 17/33  brain]
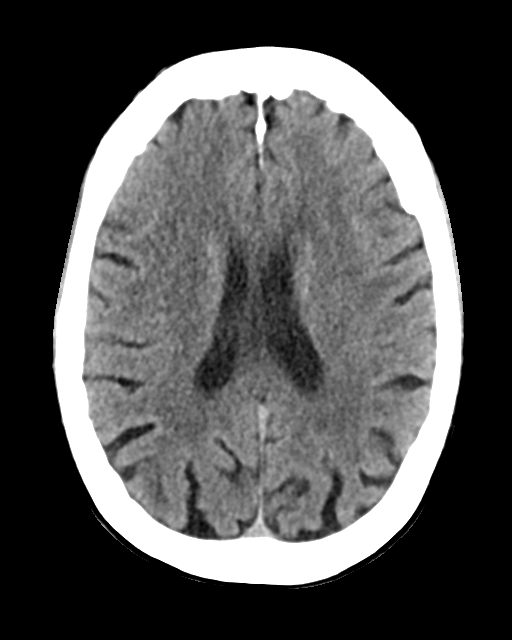
[im 21/33  brain]
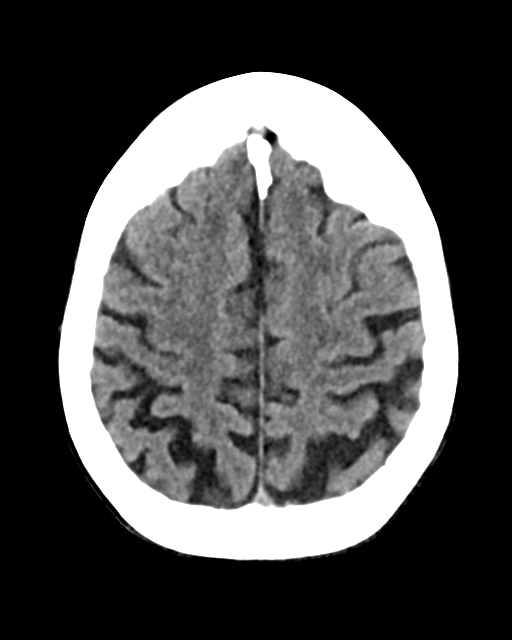
[im 21/33  bone]
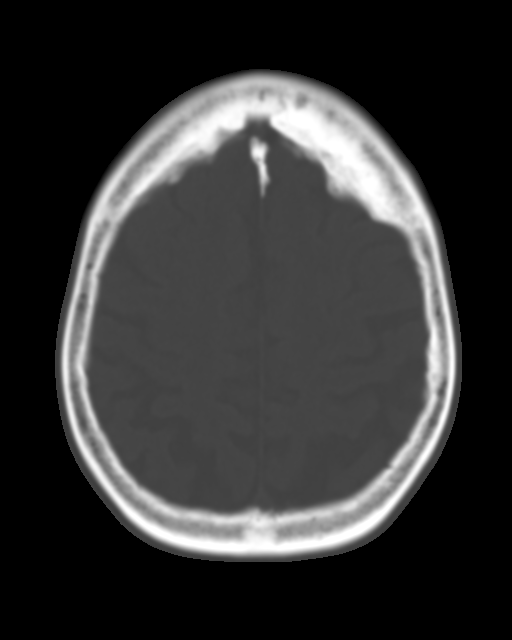
[im 25/33  brain]
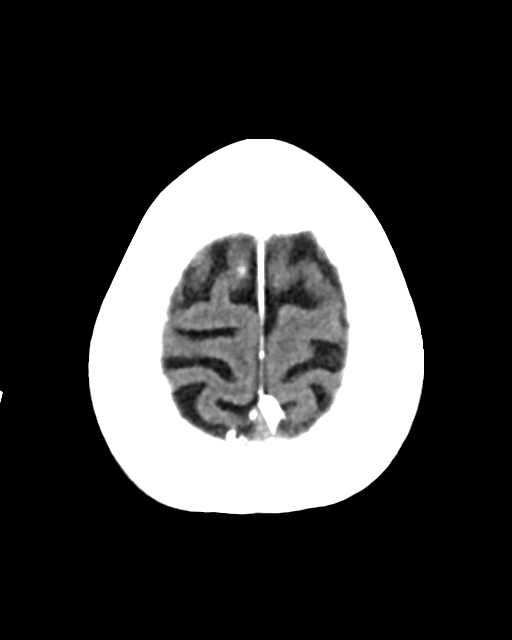
[im 29/33  brain]
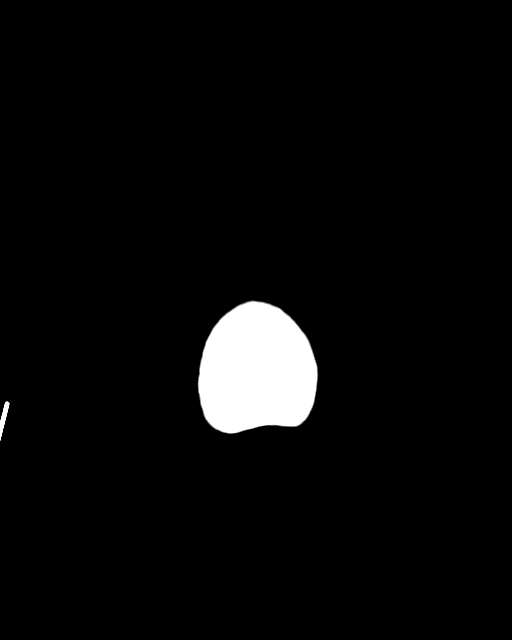

[Series 4: head bone · axial · 0.41mm/px · z∈[+1186,+1242]mm · 4 of 81 slices shown]
[im 9/81  bone]
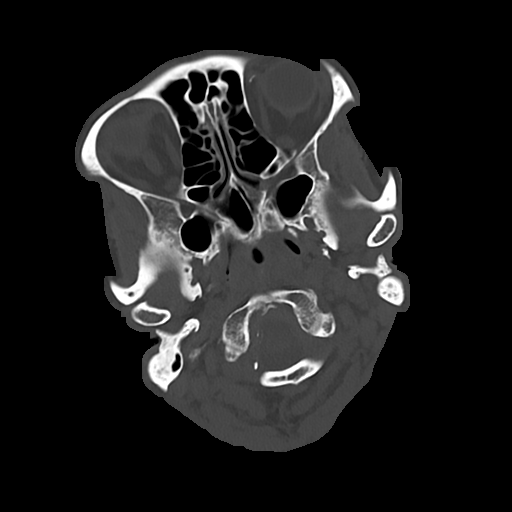
[im 17/81  bone]
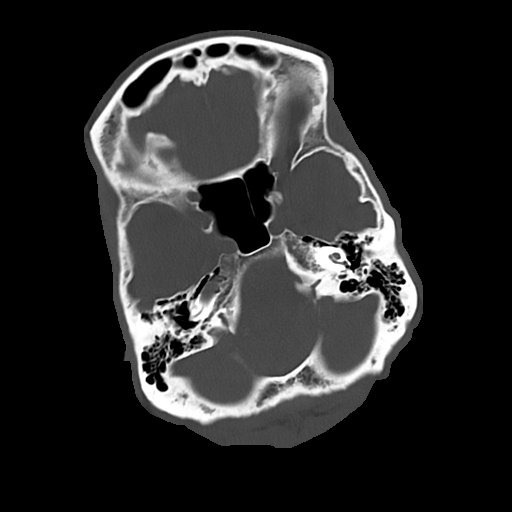
[im 25/81  bone]
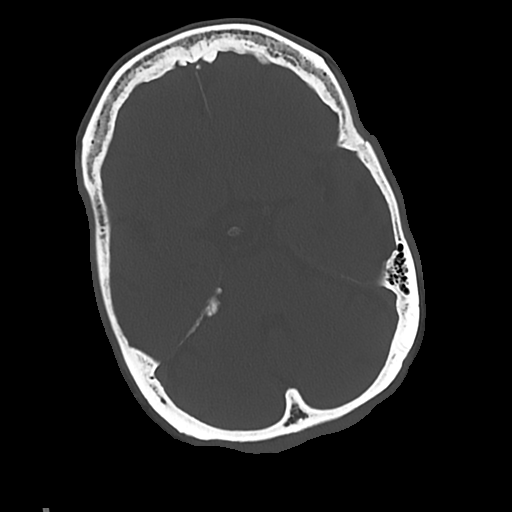
[im 37/81  bone]
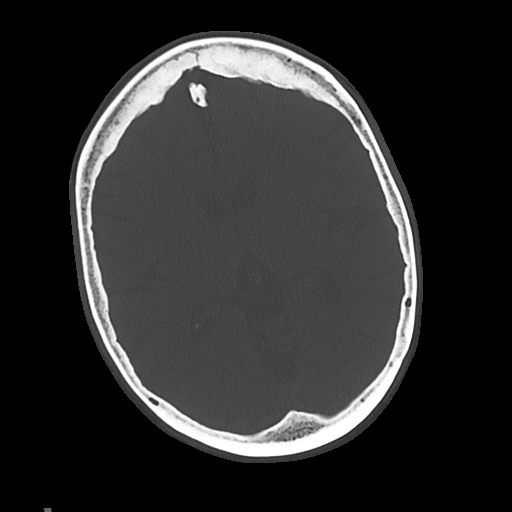

[Series 6: sag soft · sagittal · 0.31mm/px · 3 of 54 slices shown]
[im 18/54  brain]
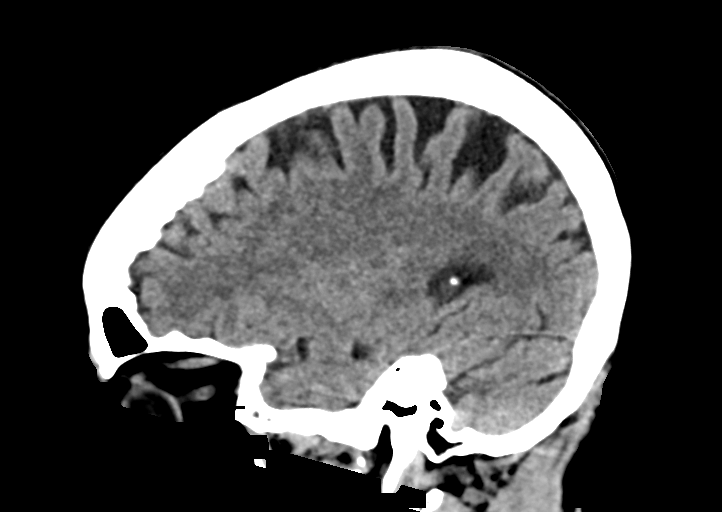
[im 27/54  brain]
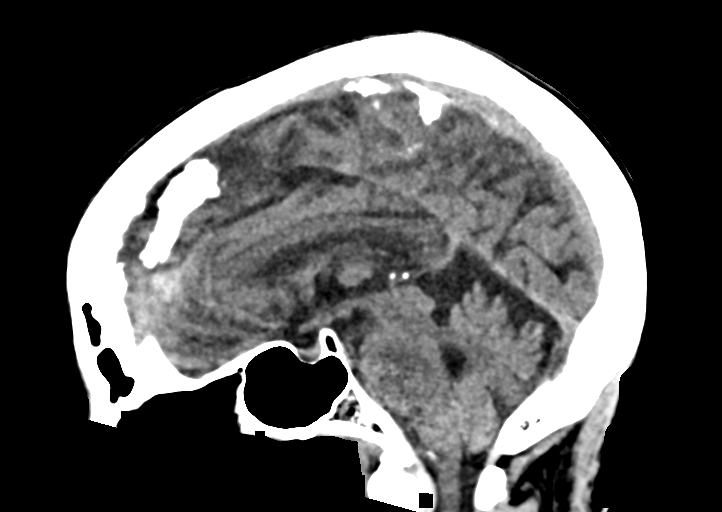
[im 36/54  brain]
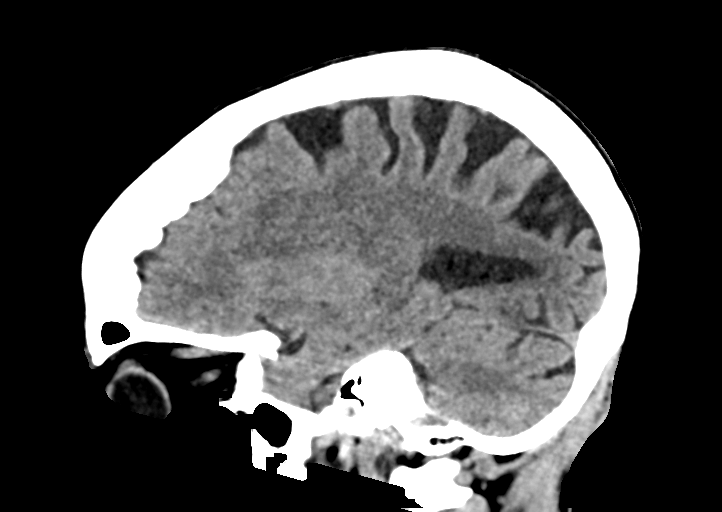

[14 of 37 positions shown; findings below may reference images not displayed]

FINDINGS: CT HEAD FINDINGS

Brain: Atrophic changes and mild chronic white matter ischemic
change is seen. No findings to suggest acute hemorrhage, acute
infarction or space-occupying mass lesion are noted.

Vascular: No hyperdense vessel or unexpected calcification.

Skull: Normal. Negative for fracture or focal lesion.

Sinuses/Orbits: No acute finding.

Other: None.

CT CERVICAL SPINE FINDINGS

Alignment: Within normal limits.

Skull base and vertebrae: 7 cervical segments are well visualized.
Vertebral body height is well maintained. Disc space narrowing is
noted at C5-6 and C6-7 with associated osteophytic changes.
Multilevel facet hypertrophic changes are noted. No acute fracture
or acute facet abnormality is seen.

Soft tissues and spinal canal: Surrounding soft tissue structures
demonstrate diffuse vascular calcifications particularly in the
origin of the left subclavian artery. No other focal abnormality is
seen.

Upper chest: Visualized lung apices are within normal limits.

Other: None
IMPRESSION: CT of the head: Chronic atrophic and ischemic changes without acute
abnormality.

CT of the cervical spine: Degenerative changes without acute
abnormality.

## 2019-12-10 ENCOUNTER — Ambulatory Visit: Payer: Medicare Other | Admitting: Cardiovascular Disease

## 2019-12-14 ENCOUNTER — Telehealth: Payer: Self-pay | Admitting: Cardiovascular Disease

## 2019-12-14 NOTE — Telephone Encounter (Signed)
Patient's daughter [name]) is requesting appt on 12/24 with Dr. Sallyanne Kuster to be virtual. Due to the weather advisory for Thursday, she does not feel comfortable bringing her mother out. She states she can do virtual through her phone.

## 2019-12-14 NOTE — Telephone Encounter (Signed)
The daughter called wanting to make the appointment virtual for 12/24. She has been advised that this should be a video visit since the patient has some concerns about her legs and pacemaker site. The daughter stated that she was able to do a video. Instructions have been given.

## 2019-12-16 ENCOUNTER — Encounter: Payer: Self-pay | Admitting: Cardiovascular Disease

## 2019-12-16 ENCOUNTER — Telehealth (INDEPENDENT_AMBULATORY_CARE_PROVIDER_SITE_OTHER): Payer: Medicare Other | Admitting: Cardiovascular Disease

## 2019-12-16 VITALS — Ht 65.0 in | Wt 120.0 lb

## 2019-12-16 DIAGNOSIS — I509 Heart failure, unspecified: Secondary | ICD-10-CM | POA: Diagnosis not present

## 2019-12-16 DIAGNOSIS — G3184 Mild cognitive impairment, so stated: Secondary | ICD-10-CM | POA: Diagnosis not present

## 2019-12-16 DIAGNOSIS — E1151 Type 2 diabetes mellitus with diabetic peripheral angiopathy without gangrene: Secondary | ICD-10-CM

## 2019-12-16 DIAGNOSIS — E7800 Pure hypercholesterolemia, unspecified: Secondary | ICD-10-CM | POA: Diagnosis not present

## 2019-12-16 DIAGNOSIS — I10 Essential (primary) hypertension: Secondary | ICD-10-CM

## 2019-12-16 DIAGNOSIS — Z794 Long term (current) use of insulin: Secondary | ICD-10-CM

## 2019-12-16 DIAGNOSIS — I4821 Permanent atrial fibrillation: Secondary | ICD-10-CM

## 2019-12-16 DIAGNOSIS — R296 Repeated falls: Secondary | ICD-10-CM

## 2019-12-16 DIAGNOSIS — N183 Chronic kidney disease, stage 3 unspecified: Secondary | ICD-10-CM

## 2019-12-16 DIAGNOSIS — E1122 Type 2 diabetes mellitus with diabetic chronic kidney disease: Secondary | ICD-10-CM

## 2019-12-16 DIAGNOSIS — E876 Hypokalemia: Secondary | ICD-10-CM

## 2019-12-16 DIAGNOSIS — N1831 Chronic kidney disease, stage 3a: Secondary | ICD-10-CM

## 2019-12-16 DIAGNOSIS — I5033 Acute on chronic diastolic (congestive) heart failure: Secondary | ICD-10-CM

## 2019-12-16 DIAGNOSIS — Z950 Presence of cardiac pacemaker: Secondary | ICD-10-CM | POA: Diagnosis not present

## 2019-12-16 DIAGNOSIS — I130 Hypertensive heart and chronic kidney disease with heart failure and stage 1 through stage 4 chronic kidney disease, or unspecified chronic kidney disease: Secondary | ICD-10-CM

## 2019-12-16 DIAGNOSIS — I739 Peripheral vascular disease, unspecified: Secondary | ICD-10-CM | POA: Diagnosis not present

## 2019-12-16 DIAGNOSIS — F09 Unspecified mental disorder due to known physiological condition: Secondary | ICD-10-CM

## 2019-12-16 DIAGNOSIS — I4891 Unspecified atrial fibrillation: Secondary | ICD-10-CM

## 2019-12-16 DIAGNOSIS — E119 Type 2 diabetes mellitus without complications: Secondary | ICD-10-CM

## 2019-12-16 MED ORDER — POTASSIUM CHLORIDE ER 20 MEQ PO TBCR: 20.0000 meq | EXTENDED_RELEASE_TABLET | Freq: Every day | ORAL | 3 refills | Status: AC

## 2019-12-16 MED ORDER — FUROSEMIDE 20 MG PO TABS: 40.0000 mg | ORAL_TABLET | Freq: Every day | ORAL | 3 refills | Status: AC

## 2019-12-16 NOTE — Patient Instructions (Addendum)
Medication Instructions:  INCREASE FUROSEMIDE TO 40MG  DAILY (TAKE 2 OF THE 20MG  TABLETS) UNTIL YOU REACH 110LBS AND THEN DECREASE TO 20MG  OF FUROSEMIDE DAILY START POTASSIUM 20MEQ DAILY *If you need a refill on your cardiac medications before your next appointment, please call your pharmacy*  Lab Work: Your physician recommends that you have lab work next week, someone will call to setup an at home nurse visit for labs and BP check  If you have labs (blood work) drawn today and your tests are completely normal, you will receive your results only by: Marland Kitchen MyChart Message (if you have MyChart) OR . A paper copy in the mail If you have any lab test that is abnormal or we need to change your treatment, we will call you to review the results.  Testing/Procedures: NONE NEEDED  Follow-Up: At [facility], you and your health needs are our priority.  As part of our continuing mission to provide you with exceptional heart care, we have created designated Provider Care Teams.  These Care Teams include your primary Cardiologist (physician) and Advanced Practice Providers (APPs -  Physician Assistants and Nurse Practitioners) who all work together to provide you with the care you need, when you need it.  Your next appointment:   1 week(s)  The format for your next appointment:   Virtual Visit   Provider:   You may see one of the following Advanced Practice Providers on your designated Care Team:    Almyra Deforest, PA-C  Fabian Sharp, PA-C or   Roby Lofts, [place]   Other Instructions Purchase arm blood pressure cuff Keep legs elevated

## 2019-12-16 NOTE — Progress Notes (Signed)
Virtual Visit via Telephone Note   This visit type was conducted due to national recommendations for restrictions regarding the COVID-19 Pandemic (e.g. social distancing) in an effort to limit this patient's exposure and mitigate transmission in our community.  Due to her co-morbid illnesses, this patient is at least at moderate risk for complications without adequate follow up.  This format is felt to be most appropriate for this patient at this time.  The patient did not have access to video technology/had technical difficulties with video requiring transitioning to audio format only (telephone).  All issues noted in this document were discussed and addressed.  No physical exam could be performed with this format.  Please refer to the patient's chart for her  consent to telehealth for [facility].   Date:  12/16/2019   ID:  Donna Owens, Donna Owens [birth year], MRN [id number]  Patient Location: Home Provider Location: Home  PCP:  Leanna Battles, MD  Cardiologist:  Sanda Klein, MD  Electrophysiologist:  None   Evaluation Performed:  Follow-Up Visit  Chief Complaint:  Headache, recent fall  History of Present Illness:    Donna Owens is a 85 y.o. female with long-term persistent atrial fibrillation with slow ventricular response, single-chamber permanent pacemaker ([place] MRI, 2019), chronic diastolic heart failure, moderate carotid stenosis (followed by Dr. early), hypertension, hyperlipidemia, diabetes mellitus type 2 despite being lean, severe neuropathy with unsteady gait and frequent falls.  I spoke today at length with Donna Owens as well as with her daughter, Donna Owens. Donna Owens has moved in with her mother since October due to her stated age physical and mental deterioration.  She is more frequently disoriented.  She has had occasional falls.  She is steadily losing weight.  She weighs 12 pounds less than at her previous appointment and it is very unclear how much of her current  weight is simply excess fluid.  Palpitations have not bothered her recently.  She denies syncope.  She has not had chest pain.  She has recently developed significant lower extremity edema.  This is symmetrical she had a blister that burst on her right anterior shin.  Using the smart phone they will did show this to me.  It seems to be granulating nicely there is no evidence of surrounding erythema or other signs of infection.  She has not had fever or chills.  She is very sedentary.  She does not have frank orthopnea but becomes easily short of breath when moving around house.  She is more emotional.  She started crying during the appointment today.  She states is because her leg hurts.  The patient does not have symptoms concerning for COVID-19 infection (fever, chills, cough, or new shortness of breath).    Past Medical History:  Diagnosis Date   Anemia    Anxiety    Arthritis    "back and right knee" (07/08/2018)   Atrophic vaginitis    Breast cancer, right breast ([place]) 2001   Carotid arterial disease ([place]) 12/13/11    mild by doppler   Chronic lower back pain    CVA (cerebral vascular accident) [facility])    "dr never told me I had one" (07/08/2018)   Depression    Hyperlipemia    Hypertension    Migraine    "1-2/year" (07/08/2018)   OSA (obstructive sleep apnea)    OSA on CPAP 06/20/2014   Osteoporosis    Presence of permanent cardiac pacemaker 07/08/2018   Pulmonary hypertension ([place])  moderate echo 11/07/10   PVD (peripheral vascular disease) ([place])    Sleep apnea    Type II diabetes mellitus ([place])    Past Surgical History:  Procedure Laterality Date   BREAST LUMPECTOMY Right 07/26/2000   chemo/radiation   BREAST LUMPECTOMY Left    benign   CARDIOVERSION N/A 07/21/2014   Procedure: CARDIOVERSION;  Surgeon: Sanda Klein, MD;  Location: [facility];  Service: Cardiovascular;  Laterality: N/A;   CATARACT EXTRACTION W/ INTRAOCULAR LENS  IMPLANT,  BILATERAL Bilateral    "not sure about left eye; ?lens"   COLONOSCOPY  2010   INSERT / REPLACE / REMOVE PACEMAKER  07/08/2018   PACEMAKER IMPLANT N/A 07/08/2018   Procedure: PACEMAKER IMPLANT;  Surgeon: Sanda Klein, MD;  Location: [facility];  Service: Cardiovascular;  Laterality: N/A;   TOENAIL EXCISION  03/2013   ingrown nail     Current Meds  Medication Sig   acetaminophen (TYLENOL) 325 MG tablet Take 325 mg by mouth every 6 (six) hours as needed for moderate pain or headache.    amLODipine (NORVASC) 5 MG tablet Take 1 tablet (5 mg total) by mouth daily.   Calcium Carbonate-Vitamin D (CALCIUM-VITAMIN D3 PO) Take 1 tablet by mouth daily.   Cholecalciferol (VITAMIN D-3) 5000 units TABS Take 5,000 Units by mouth daily.    Coenzyme Q10 (CO Q 10) 100 MG CAPS Take 100 mg by mouth daily.    Cyanocobalamin (VITAMIN B-12) 2500 MCG SUBL Place 2,500 mcg under the tongue daily.   doxepin (SINEQUAN) 10 MG capsule Take 10 mg by mouth at bedtime.    ELIQUIS 2.5 MG TABS tablet TAKE 1 TABLET(2.5 MG) BY MOUTH TWICE DAILY (Patient taking differently: Take 2.5 mg by mouth 2 (two) times daily. )   ferrous sulfate 325 (65 FE) MG tablet Take 325 mg by mouth daily.    FLUoxetine (PROZAC) 10 MG capsule    furosemide (LASIX) 20 MG tablet Take 2 tablets (40 mg total) by mouth daily. Take 2 tablets daily until you reach 110lbs and then decrease to 1 tablet daily   gabapentin (NEURONTIN) 100 MG capsule Take 1 capsule (100 mg total) by mouth at bedtime.   latanoprost (XALATAN) 0.005 % ophthalmic solution Place 1 drop into both eyes at bedtime.    memantine (NAMENDA) 10 MG tablet Take 1 tablet (10 mg total) by mouth 2 (two) times daily.   metFORMIN (GLUCOPHAGE) 500 MG tablet TAKE 1 TABLET BY MOUTH DAILY (Patient taking differently: Take 500 mg by mouth daily with breakfast. )   MYRBETRIQ 50 MG TB24 tablet Take 50 mg by mouth daily.    olmesartan-hydrochlorothiazide (BENICAR HCT)  20-12.5 MG tablet Take 1 tablet by mouth daily.    omega-3 acid ethyl esters (LOVAZA) 1 g capsule Take 1 g by mouth daily.   Polyethyl Glycol-Propyl Glycol (SYSTANE) 0.4-0.3 % SOLN Place 2 drops into both eyes every 8 (eight) hours as needed (for dry eyes).    pravastatin (PRAVACHOL) 40 MG tablet Take 40 mg by mouth daily.   topiramate (TOPAMAX) 25 MG tablet Take 25 mg by mouth daily.   [DISCONTINUED] furosemide (LASIX) 20 MG tablet Take 0.5 tablets (10 mg total) by mouth every Monday, Wednesday, and Friday.     Allergies:   Patient has no known allergies.   Social History   Tobacco Use   Smoking status: Never Smoker   Smokeless tobacco: Never Used  Substance Use Topics   Alcohol use: Never   Drug use: Never  Family Hx: The patient's family history includes Diabetes in her brother and another family member; Heart failure (age of onset: 80) in her daughter; Hypertension in her brother.  ROS:   Please see the history of present illness.    All other systems are reviewed and are negative. .   Prior CV studies:   The following studies were reviewed today:  Comprehensive pacemaker download from November 03, 2019 shows estimated generator longevity of about 10 years.  She has 94% ventricular pacing and there is no evidence of rapid ventricular rates.  She is not taking any rate control medications.  Labs/Other Tests and Data Reviewed:    EKG:  An ECG dated 10/29/2018 was personally reviewed today and demonstrated:  Atrial fibrillation with ventricular paced rhythm  Recent Labs: 06/04/2019: ALT 9; BUN 32; Creatinine, Ser 1.55; Hemoglobin 12.1; Platelets 146; Potassium 4.4; Sodium 139; TSH 1.630   Recent Lipid Panel Lab Results  Component Value Date/Time   CHOL 163 06/04/2019 11:45 AM   TRIG 46 06/04/2019 11:45 AM   HDL 74 06/04/2019 11:45 AM   CHOLHDL 2.2 06/04/2019 11:45 AM   CHOLHDL 2.6 02/02/2014 11:06 AM   LDLCALC 80 06/04/2019 11:45 AM    Wt Readings from  Last 3 Encounters:  12/16/19 120 lb (54.4 kg)  10/19/19 121 lb (54.9 kg)  10/05/19 121 lb (54.9 kg)     Objective:    Vital Signs:  Ht 5\' 5"  (1.651 m)    Wt 120 lb (54.4 kg)    BMI 19.97 kg/m  She cannot find her blood pressure cuff.  VITAL SIGNS:  reviewed GEN:  no acute distress EYES:  sclerae anicteric, EOMI - Extraocular Movements Intact RESPIRATORY:  normal respiratory effort, symmetric expansion CARDIOVASCULAR:  lower extremity edema noted SKIN:  What seems to be a roughly 7-8 cm superficial skin lesion with peeling back of the epidermis and a red base, anterior right shin is seen with the phone today. MUSCULOSKELETAL:  no obvious deformities. NEURO:  Hard of hearing PSYCH:  Labile affect, starts crying easily Some signs of worsening short-term memory  ASSESSMENT & PLAN:    1. Falls frequently   2. Permanent atrial fibrillation ([place])   3. Acute on chronic diastolic (congestive) heart failure (HCC)   4. Stage 3a chronic kidney disease   5. PAD (peripheral artery disease) ([place])   6. Diabetes mellitus type 2 in nonobese (HCC)   7. Hypercholesterolemia   8. Essential hypertension   9. Cognitive dysfunction   10. Hypokalemia     1. Head injury: There was no evidence of intracranial hemorrhage after her previous fall, but the head CT showed chronic atrophic and ischemic changes of the cerebrum. 2. Skin laceration: Suggested loosely covering it with a nonstick dressing.  We will ask the visiting nurse to take a look at it. 3. AFib: Permanent arrhythmia with slow ventricular response.  Even though she does not take any AV nodal blocking agents she has 93% ventricular pacing.  Embolic risk is high, but when we have reached the point where the risk of intracranial hemorrhage due to falls appears to be excessively high.  She has not had no stroke or TIA. CHADSVasc 7 (age 27, gender, HTN, DM, HF, PAD). 4. CHF: We reduced her dose of diuretic to avoid worsening renal function but she  has now evidence of clear hypervolemia which may explain the lesion on her right leg.  We will increase the diuretic until we reduce her current weight by about  10 pounds and then reassess. 5. CKD 3: Will have to recheck after we increase the dose of diuretic.  We will try to get a visiting nurse to do that. 6. PAD: Denies claudication, has a history of mostly infrapopliteal disease by previous ultrasound 2017.  She is very sedentary.  The lesion on her right shin does not have the appearance of arterial insufficiency related to gangrene, but rather of undermined skin due to severe edema. 7. DM: Good control per her report.  On metformin monotherapy.  Hypoglycemia would be an unlikely reason for her cognitive issues. 8. HLP: All lipid parameters were acceptable when checked in August 2020 9. HTN: Unable to check today.  I asked her daughter to get a new blood pressure cuff since the permanent appears to be broken. 10. Cognitive dysfunction: Seems to have moderately rapid progression of her problems.  Still wants to live at home and her daughter has only been with   COVID-19 Education: The signs and symptoms of COVID-19 were discussed with the patient and how to seek care for testing (follow up with PCP or arrange E-visit).  The importance of social distancing was discussed today.  Time:   Today, I have spent 27 minutes with the patient with telehealth technology discussing the above problems.     Medication Adjustments/Labs and Tests Ordered: Current medicines are reviewed at length with the patient today.  Concerns regarding medicines are outlined above.   Tests Ordered: Orders Placed This Encounter  Procedures   Basic Metabolic Panel (BMET)    Medication Changes: Meds ordered this encounter  Medications   furosemide (LASIX) 20 MG tablet    Sig: Take 2 tablets (40 mg total) by mouth daily. Take 2 tablets daily until you reach 110lbs and then decrease to 1 tablet daily    Dispense:   90 tablet    Refill:  3   potassium chloride 20 MEQ TBCR    Sig: Take 20 mEq by mouth daily.    Dispense:  90 tablet    Refill:  3   Patient Instructions  Medication Instructions:  INCREASE FUROSEMIDE TO 40MG  DAILY (TAKE 2 OF THE 20MG  TABLETS) UNTIL YOU REACH 110LBS AND THEN DECREASE TO 20MG  OF FUROSEMIDE DAILY START POTASSIUM 20MEQ DAILY *If you need a refill on your cardiac medications before your next appointment, please call your pharmacy*  Lab Work: Your physician recommends that you have lab work next week, someone will call to setup an at home nurse visit for labs and BP check  If you have labs (blood work) drawn today and your tests are completely normal, you will receive your results only by:  MyChart Message (if you have MyChart) OR  A paper copy in the mail If you have any lab test that is abnormal or we need to change your treatment, we will call you to review the results.  Testing/Procedures: NONE NEEDED  Follow-Up: At [facility], you and your health needs are our priority.  As part of our continuing mission to provide you with exceptional heart care, we have created designated Provider Care Teams.  These Care Teams include your primary Cardiologist (physician) and Advanced Practice Providers (APPs -  Physician Assistants and Nurse Practitioners) who all work together to provide you with the care you need, when you need it.  Your next appointment:   1 week(s)  The format for your next appointment:   Virtual Visit   Provider:   You may see one of the following  Advanced Practice Providers on your designated Care Team:    Almyra Deforest, PA-C  Fabian Sharp, [place] or   Roby Lofts, [place]   Other Instructions Purchase arm blood pressure cuff Keep legs elevated     Disposition:  Follow up 6 months.  We will call her with the results of the scans.  Signed, Sanda Klein, MD  12/16/2019 4:24 PM    [place]

## 2019-12-19 DIAGNOSIS — F0280 Dementia in other diseases classified elsewhere without behavioral disturbance: Secondary | ICD-10-CM | POA: Diagnosis not present

## 2019-12-19 DIAGNOSIS — I87303 Chronic venous hypertension (idiopathic) without complications of bilateral lower extremity: Secondary | ICD-10-CM | POA: Diagnosis not present

## 2019-12-19 DIAGNOSIS — I441 Atrioventricular block, second degree: Secondary | ICD-10-CM | POA: Diagnosis not present

## 2019-12-19 DIAGNOSIS — I129 Hypertensive chronic kidney disease with stage 1 through stage 4 chronic kidney disease, or unspecified chronic kidney disease: Secondary | ICD-10-CM | POA: Diagnosis not present

## 2019-12-19 DIAGNOSIS — E1122 Type 2 diabetes mellitus with diabetic chronic kidney disease: Secondary | ICD-10-CM | POA: Diagnosis not present

## 2019-12-19 DIAGNOSIS — F419 Anxiety disorder, unspecified: Secondary | ICD-10-CM | POA: Diagnosis not present

## 2019-12-19 DIAGNOSIS — G4733 Obstructive sleep apnea (adult) (pediatric): Secondary | ICD-10-CM | POA: Diagnosis not present

## 2019-12-19 DIAGNOSIS — E559 Vitamin D deficiency, unspecified: Secondary | ICD-10-CM | POA: Diagnosis not present

## 2019-12-19 DIAGNOSIS — I4892 Unspecified atrial flutter: Secondary | ICD-10-CM | POA: Diagnosis not present

## 2019-12-19 DIAGNOSIS — M1612 Unilateral primary osteoarthritis, left hip: Secondary | ICD-10-CM | POA: Diagnosis not present

## 2019-12-19 DIAGNOSIS — H6123 Impacted cerumen, bilateral: Secondary | ICD-10-CM | POA: Diagnosis not present

## 2019-12-19 DIAGNOSIS — E1151 Type 2 diabetes mellitus with diabetic peripheral angiopathy without gangrene: Secondary | ICD-10-CM | POA: Diagnosis not present

## 2019-12-19 DIAGNOSIS — F5104 Psychophysiologic insomnia: Secondary | ICD-10-CM | POA: Diagnosis not present

## 2019-12-19 DIAGNOSIS — L570 Actinic keratosis: Secondary | ICD-10-CM | POA: Diagnosis not present

## 2019-12-19 DIAGNOSIS — N183 Chronic kidney disease, stage 3 unspecified: Secondary | ICD-10-CM | POA: Diagnosis not present

## 2019-12-19 DIAGNOSIS — R339 Retention of urine, unspecified: Secondary | ICD-10-CM | POA: Diagnosis not present

## 2019-12-19 DIAGNOSIS — D631 Anemia in chronic kidney disease: Secondary | ICD-10-CM | POA: Diagnosis not present

## 2019-12-19 DIAGNOSIS — E46 Unspecified protein-calorie malnutrition: Secondary | ICD-10-CM | POA: Diagnosis not present

## 2019-12-19 DIAGNOSIS — F329 Major depressive disorder, single episode, unspecified: Secondary | ICD-10-CM | POA: Diagnosis not present

## 2019-12-19 DIAGNOSIS — H9193 Unspecified hearing loss, bilateral: Secondary | ICD-10-CM | POA: Diagnosis not present

## 2019-12-19 DIAGNOSIS — M5117 Intervertebral disc disorders with radiculopathy, lumbosacral region: Secondary | ICD-10-CM | POA: Diagnosis not present

## 2019-12-19 DIAGNOSIS — E785 Hyperlipidemia, unspecified: Secondary | ICD-10-CM | POA: Diagnosis not present

## 2019-12-19 DIAGNOSIS — I2510 Atherosclerotic heart disease of native coronary artery without angina pectoris: Secondary | ICD-10-CM | POA: Diagnosis not present

## 2019-12-19 DIAGNOSIS — S81811D Laceration without foreign body, right lower leg, subsequent encounter: Secondary | ICD-10-CM | POA: Diagnosis not present

## 2019-12-19 DIAGNOSIS — G309 Alzheimer's disease, unspecified: Secondary | ICD-10-CM | POA: Diagnosis not present

## 2019-12-20 ENCOUNTER — Telehealth: Payer: Self-pay | Admitting: *Deleted

## 2019-12-20 DIAGNOSIS — I5033 Acute on chronic diastolic (congestive) heart failure: Secondary | ICD-10-CM

## 2019-12-20 NOTE — Telephone Encounter (Signed)
Donna Owens Owens Visit Initial Request  Date of Request ([place]):  December 20, 2019  Requesting Provider:  DR. Sallyanne Kuster    Agency Requested:    [facility] Contact:  Glory Buff, NP [address] [place], [place] [address] Phone #:  [phone] Fax #:  [phone]  Patient Demographic Information: Name:  Donna Owens Owens Age:  85 y.o.   DOB:  [birth year]  MRN:  [id number]   Address:   [address] [place] [address]   Phone Numbers:   Home Phone [phone]  Mobile [phone]     Emergency Contact Information on File:   Contact Information    Name Relation Home Work Mobile   Meiring-Worthy,[name] Daughter   [phone]   Milliana, Marinas   [phone]      The above family members may be contacted for information on this patient (review DPR on file):  No    Patient Clinical Information:  Primary Care Provider:  Leanna Battles, MD  Primary Cardiologist:  Sanda Klein, MD  Primary Electrophysiologist:  None   Past Medical Hx: [name]  has a past medical history of Anemia, Anxiety, Arthritis, Atrophic vaginitis, Breast cancer, right breast ([place]) (2001), Carotid arterial disease ([place]) (12/13/11), Chronic lower back pain, CVA (cerebral vascular accident) ([place]), Depression, Hyperlipemia, Hypertension, Migraine, OSA (obstructive sleep apnea), OSA on CPAP (06/20/2014), Osteoporosis, Presence of permanent cardiac pacemaker (07/08/2018), Pulmonary hypertension ([place]), PVD (peripheral vascular disease) ([place]), Sleep apnea, and Type II diabetes mellitus ([place]).   Allergies: She has No Known Allergies.   Medications: Current Outpatient Medications on File Prior to Visit  Medication Sig  . acetaminophen (TYLENOL) 325 MG tablet Take 325 mg by mouth every 6 (six) hours as needed for moderate pain or headache.   Marland Kitchen amLODipine (NORVASC) 5 MG tablet Take 1 tablet (5 mg total) by mouth daily.  . Calcium Carbonate-Vitamin D  (CALCIUM-VITAMIN D3 PO) Take 1 tablet by mouth daily.  . Cholecalciferol (VITAMIN D-3) 5000 units TABS Take 5,000 Units by mouth daily.   . Coenzyme Q10 (CO Q 10) 100 MG CAPS Take 100 mg by mouth daily.   . Cyanocobalamin (VITAMIN B-12) 2500 MCG SUBL Place 2,500 mcg under the tongue daily.  Marland Kitchen doxepin (SINEQUAN) 10 MG capsule Take 10 mg by mouth at bedtime.   Marland Kitchen ELIQUIS 2.5 MG TABS tablet TAKE 1 TABLET(2.5 MG) BY MOUTH TWICE DAILY (Patient taking differently: Take 2.5 mg by mouth 2 (two) times daily. )  . ferrous sulfate 325 (65 FE) MG tablet Take 325 mg by mouth daily.   Marland Kitchen FLUoxetine (PROZAC) 10 MG capsule   . furosemide (LASIX) 20 MG tablet Take 2 tablets (40 mg total) by mouth daily. Take 2 tablets daily until you reach 110lbs and then decrease to 1 tablet daily  . gabapentin (NEURONTIN) 100 MG capsule Take 1 capsule (100 mg total) by mouth at bedtime.  Marland Kitchen latanoprost (XALATAN) 0.005 % ophthalmic solution Place 1 drop into both eyes at bedtime.   . memantine (NAMENDA) 10 MG tablet Take 1 tablet (10 mg total) by mouth 2 (two) times daily.  . metFORMIN (GLUCOPHAGE) 500 MG tablet TAKE 1 TABLET BY MOUTH DAILY (Patient taking differently: Take 500 mg by mouth daily with breakfast. )  . MYRBETRIQ 50 MG TB24 tablet Take 50 mg by mouth daily.   Marland Kitchen olmesartan-hydrochlorothiazide (BENICAR HCT) 20-12.5 MG tablet Take 1 tablet by mouth daily.   Marland Kitchen omega-3 acid ethyl esters (LOVAZA) 1 g capsule Take  1 g by mouth daily.  Vladimir Faster Glycol-Propyl Glycol (SYSTANE) 0.4-0.3 % SOLN Place 2 drops into both eyes every 8 (eight) hours as needed (for dry eyes).   . potassium chloride 20 MEQ TBCR Take 20 mEq by mouth daily.  . pravastatin (PRAVACHOL) 40 MG tablet Take 40 mg by mouth daily.  Marland Kitchen topiramate (TOPAMAX) 25 MG tablet Take 25 mg by mouth daily.   No current facility-administered medications on file prior to visit.     Social Hx: She  reports that she has never smoked. She has never used smokeless tobacco.  She reports that she does not drink alcohol or use drugs.    Diagnosis/Reason for Visit:   LAB WORK (BMET) AND BP CHECK; FREQUENT FALLS, A-FIB, CHF  Services Requested:  Vital Signs (BP, Pulse, O2, Weight)  Medication Reconciliation  Labs:  BMET  # of Visits Needed/Frequency per Week: 1 VISIT IF POSSIBLE BY 12/24/19

## 2019-12-23 DIAGNOSIS — D631 Anemia in chronic kidney disease: Secondary | ICD-10-CM | POA: Diagnosis not present

## 2019-12-23 DIAGNOSIS — E1122 Type 2 diabetes mellitus with diabetic chronic kidney disease: Secondary | ICD-10-CM | POA: Diagnosis not present

## 2019-12-23 DIAGNOSIS — S81811D Laceration without foreign body, right lower leg, subsequent encounter: Secondary | ICD-10-CM | POA: Diagnosis not present

## 2019-12-23 DIAGNOSIS — E1151 Type 2 diabetes mellitus with diabetic peripheral angiopathy without gangrene: Secondary | ICD-10-CM | POA: Diagnosis not present

## 2019-12-23 DIAGNOSIS — I129 Hypertensive chronic kidney disease with stage 1 through stage 4 chronic kidney disease, or unspecified chronic kidney disease: Secondary | ICD-10-CM | POA: Diagnosis not present

## 2019-12-23 DIAGNOSIS — N183 Chronic kidney disease, stage 3 unspecified: Secondary | ICD-10-CM | POA: Diagnosis not present

## 2019-12-23 LAB — BASIC METABOLIC PANEL
BUN/Creatinine Ratio: 19 (ref 12–28)
BUN: 23 mg/dL (ref 8–27)
CO2: 24 mmol/L (ref 20–29)
Calcium: 9.9 mg/dL (ref 8.7–10.3)
Chloride: 106 mmol/L (ref 96–106)
Creatinine, Ser: 1.23 mg/dL — ABNORMAL HIGH (ref 0.57–1.00)
GFR calc Af Amer: 45 mL/min/{1.73_m2} — ABNORMAL LOW (ref >59)
GFR calc non Af Amer: 39 mL/min/{1.73_m2} — ABNORMAL LOW (ref >59)
Glucose: 100 mg/dL — ABNORMAL HIGH (ref 65–99)
Potassium: 5.1 mmol/L (ref 3.5–5.2)
Sodium: 141 mmol/L (ref 134–144)

## 2019-12-27 DIAGNOSIS — D631 Anemia in chronic kidney disease: Secondary | ICD-10-CM | POA: Diagnosis not present

## 2019-12-27 DIAGNOSIS — N183 Chronic kidney disease, stage 3 unspecified: Secondary | ICD-10-CM | POA: Diagnosis not present

## 2019-12-27 DIAGNOSIS — E1151 Type 2 diabetes mellitus with diabetic peripheral angiopathy without gangrene: Secondary | ICD-10-CM | POA: Diagnosis not present

## 2019-12-27 DIAGNOSIS — E1122 Type 2 diabetes mellitus with diabetic chronic kidney disease: Secondary | ICD-10-CM | POA: Diagnosis not present

## 2019-12-27 DIAGNOSIS — I129 Hypertensive chronic kidney disease with stage 1 through stage 4 chronic kidney disease, or unspecified chronic kidney disease: Secondary | ICD-10-CM | POA: Diagnosis not present

## 2019-12-27 DIAGNOSIS — S81811D Laceration without foreign body, right lower leg, subsequent encounter: Secondary | ICD-10-CM | POA: Diagnosis not present

## 2019-12-28 ENCOUNTER — Encounter: Payer: Self-pay | Admitting: *Deleted

## 2019-12-29 DIAGNOSIS — E1151 Type 2 diabetes mellitus with diabetic peripheral angiopathy without gangrene: Secondary | ICD-10-CM | POA: Diagnosis not present

## 2019-12-29 DIAGNOSIS — I129 Hypertensive chronic kidney disease with stage 1 through stage 4 chronic kidney disease, or unspecified chronic kidney disease: Secondary | ICD-10-CM | POA: Diagnosis not present

## 2019-12-29 DIAGNOSIS — E1122 Type 2 diabetes mellitus with diabetic chronic kidney disease: Secondary | ICD-10-CM | POA: Diagnosis not present

## 2019-12-29 DIAGNOSIS — D631 Anemia in chronic kidney disease: Secondary | ICD-10-CM | POA: Diagnosis not present

## 2019-12-29 DIAGNOSIS — N183 Chronic kidney disease, stage 3 unspecified: Secondary | ICD-10-CM | POA: Diagnosis not present

## 2019-12-29 DIAGNOSIS — S81811D Laceration without foreign body, right lower leg, subsequent encounter: Secondary | ICD-10-CM | POA: Diagnosis not present

## 2019-12-30 DIAGNOSIS — S81811D Laceration without foreign body, right lower leg, subsequent encounter: Secondary | ICD-10-CM | POA: Diagnosis not present

## 2019-12-30 DIAGNOSIS — E1151 Type 2 diabetes mellitus with diabetic peripheral angiopathy without gangrene: Secondary | ICD-10-CM | POA: Diagnosis not present

## 2019-12-30 DIAGNOSIS — N183 Chronic kidney disease, stage 3 unspecified: Secondary | ICD-10-CM | POA: Diagnosis not present

## 2019-12-30 DIAGNOSIS — D631 Anemia in chronic kidney disease: Secondary | ICD-10-CM | POA: Diagnosis not present

## 2019-12-30 DIAGNOSIS — E1122 Type 2 diabetes mellitus with diabetic chronic kidney disease: Secondary | ICD-10-CM | POA: Diagnosis not present

## 2019-12-30 DIAGNOSIS — I129 Hypertensive chronic kidney disease with stage 1 through stage 4 chronic kidney disease, or unspecified chronic kidney disease: Secondary | ICD-10-CM | POA: Diagnosis not present

## 2020-01-03 DIAGNOSIS — S81811D Laceration without foreign body, right lower leg, subsequent encounter: Secondary | ICD-10-CM | POA: Diagnosis not present

## 2020-01-03 DIAGNOSIS — D631 Anemia in chronic kidney disease: Secondary | ICD-10-CM | POA: Diagnosis not present

## 2020-01-03 DIAGNOSIS — E1122 Type 2 diabetes mellitus with diabetic chronic kidney disease: Secondary | ICD-10-CM | POA: Diagnosis not present

## 2020-01-03 DIAGNOSIS — I129 Hypertensive chronic kidney disease with stage 1 through stage 4 chronic kidney disease, or unspecified chronic kidney disease: Secondary | ICD-10-CM | POA: Diagnosis not present

## 2020-01-03 DIAGNOSIS — N183 Chronic kidney disease, stage 3 unspecified: Secondary | ICD-10-CM | POA: Diagnosis not present

## 2020-01-03 DIAGNOSIS — E1151 Type 2 diabetes mellitus with diabetic peripheral angiopathy without gangrene: Secondary | ICD-10-CM | POA: Diagnosis not present

## 2020-01-05 DIAGNOSIS — E1122 Type 2 diabetes mellitus with diabetic chronic kidney disease: Secondary | ICD-10-CM | POA: Diagnosis not present

## 2020-01-05 DIAGNOSIS — S81811D Laceration without foreign body, right lower leg, subsequent encounter: Secondary | ICD-10-CM | POA: Diagnosis not present

## 2020-01-05 DIAGNOSIS — E1151 Type 2 diabetes mellitus with diabetic peripheral angiopathy without gangrene: Secondary | ICD-10-CM | POA: Diagnosis not present

## 2020-01-05 DIAGNOSIS — N183 Chronic kidney disease, stage 3 unspecified: Secondary | ICD-10-CM | POA: Diagnosis not present

## 2020-01-05 DIAGNOSIS — D631 Anemia in chronic kidney disease: Secondary | ICD-10-CM | POA: Diagnosis not present

## 2020-01-05 DIAGNOSIS — I129 Hypertensive chronic kidney disease with stage 1 through stage 4 chronic kidney disease, or unspecified chronic kidney disease: Secondary | ICD-10-CM | POA: Diagnosis not present

## 2020-01-06 DIAGNOSIS — S81811D Laceration without foreign body, right lower leg, subsequent encounter: Secondary | ICD-10-CM | POA: Diagnosis not present

## 2020-01-06 DIAGNOSIS — N183 Chronic kidney disease, stage 3 unspecified: Secondary | ICD-10-CM | POA: Diagnosis not present

## 2020-01-06 DIAGNOSIS — E1122 Type 2 diabetes mellitus with diabetic chronic kidney disease: Secondary | ICD-10-CM | POA: Diagnosis not present

## 2020-01-06 DIAGNOSIS — E1151 Type 2 diabetes mellitus with diabetic peripheral angiopathy without gangrene: Secondary | ICD-10-CM | POA: Diagnosis not present

## 2020-01-06 DIAGNOSIS — I129 Hypertensive chronic kidney disease with stage 1 through stage 4 chronic kidney disease, or unspecified chronic kidney disease: Secondary | ICD-10-CM | POA: Diagnosis not present

## 2020-01-06 DIAGNOSIS — D631 Anemia in chronic kidney disease: Secondary | ICD-10-CM | POA: Diagnosis not present

## 2020-01-10 ENCOUNTER — Emergency Department (HOSPITAL_COMMUNITY): Payer: Medicare Other

## 2020-01-10 ENCOUNTER — Encounter (HOSPITAL_COMMUNITY): Payer: Self-pay | Admitting: Emergency Medicine

## 2020-01-10 ENCOUNTER — Other Ambulatory Visit: Payer: Self-pay

## 2020-01-10 ENCOUNTER — Emergency Department (HOSPITAL_COMMUNITY)
Admission: EM | Admit: 2020-01-10 | Discharge: 2020-01-10 | Disposition: A | Discharge status: Home / Self Care | Payer: Medicare Other | Attending: Emergency Medicine | Admitting: Emergency Medicine

## 2020-01-10 DIAGNOSIS — I110 Hypertensive heart disease with heart failure: Secondary | ICD-10-CM | POA: Insufficient documentation

## 2020-01-10 DIAGNOSIS — I129 Hypertensive chronic kidney disease with stage 1 through stage 4 chronic kidney disease, or unspecified chronic kidney disease: Secondary | ICD-10-CM | POA: Diagnosis not present

## 2020-01-10 DIAGNOSIS — E1165 Type 2 diabetes mellitus with hyperglycemia: Secondary | ICD-10-CM | POA: Diagnosis not present

## 2020-01-10 DIAGNOSIS — Z853 Personal history of malignant neoplasm of breast: Secondary | ICD-10-CM | POA: Insufficient documentation

## 2020-01-10 DIAGNOSIS — Z7984 Long term (current) use of oral hypoglycemic drugs: Secondary | ICD-10-CM | POA: Insufficient documentation

## 2020-01-10 DIAGNOSIS — Z7901 Long term (current) use of anticoagulants: Secondary | ICD-10-CM | POA: Diagnosis not present

## 2020-01-10 DIAGNOSIS — Z950 Presence of cardiac pacemaker: Secondary | ICD-10-CM | POA: Diagnosis not present

## 2020-01-10 DIAGNOSIS — N183 Chronic kidney disease, stage 3 unspecified: Secondary | ICD-10-CM | POA: Diagnosis not present

## 2020-01-10 DIAGNOSIS — R296 Repeated falls: Secondary | ICD-10-CM | POA: Diagnosis not present

## 2020-01-10 DIAGNOSIS — S81811D Laceration without foreign body, right lower leg, subsequent encounter: Secondary | ICD-10-CM | POA: Diagnosis not present

## 2020-01-10 DIAGNOSIS — E119 Type 2 diabetes mellitus without complications: Secondary | ICD-10-CM | POA: Diagnosis not present

## 2020-01-10 DIAGNOSIS — R519 Headache, unspecified: Secondary | ICD-10-CM | POA: Diagnosis not present

## 2020-01-10 DIAGNOSIS — R531 Weakness: Secondary | ICD-10-CM

## 2020-01-10 DIAGNOSIS — E1151 Type 2 diabetes mellitus with diabetic peripheral angiopathy without gangrene: Secondary | ICD-10-CM | POA: Diagnosis not present

## 2020-01-10 DIAGNOSIS — Z20822 Contact with and (suspected) exposure to covid-19: Secondary | ICD-10-CM | POA: Diagnosis not present

## 2020-01-10 DIAGNOSIS — R29810 Facial weakness: Secondary | ICD-10-CM | POA: Diagnosis not present

## 2020-01-10 DIAGNOSIS — I5032 Chronic diastolic (congestive) heart failure: Secondary | ICD-10-CM | POA: Insufficient documentation

## 2020-01-10 DIAGNOSIS — E860 Dehydration: Secondary | ICD-10-CM

## 2020-01-10 DIAGNOSIS — E1122 Type 2 diabetes mellitus with diabetic chronic kidney disease: Secondary | ICD-10-CM | POA: Diagnosis not present

## 2020-01-10 DIAGNOSIS — Z79899 Other long term (current) drug therapy: Secondary | ICD-10-CM | POA: Diagnosis not present

## 2020-01-10 DIAGNOSIS — D631 Anemia in chronic kidney disease: Secondary | ICD-10-CM | POA: Diagnosis not present

## 2020-01-10 DIAGNOSIS — I10 Essential (primary) hypertension: Secondary | ICD-10-CM | POA: Diagnosis not present

## 2020-01-10 DIAGNOSIS — R0902 Hypoxemia: Secondary | ICD-10-CM | POA: Diagnosis not present

## 2020-01-10 LAB — BASIC METABOLIC PANEL
Anion gap: 14 (ref 5–15)
BUN: 38 mg/dL — ABNORMAL HIGH (ref 8–23)
CO2: 25 mmol/L (ref 22–32)
Calcium: 10 mg/dL (ref 8.9–10.3)
Chloride: 99 mmol/L (ref 98–111)
Creatinine, Ser: 1.86 mg/dL — ABNORMAL HIGH (ref 0.44–1.00)
GFR calc Af Amer: 27 mL/min — ABNORMAL LOW (ref >60)
GFR calc non Af Amer: 24 mL/min — ABNORMAL LOW (ref >60)
Glucose, Bld: 77 mg/dL (ref 70–99)
Potassium: 4.3 mmol/L (ref 3.5–5.1)
Sodium: 138 mmol/L (ref 135–145)

## 2020-01-10 LAB — CBC WITH DIFFERENTIAL/PLATELET
Abs Immature Granulocytes: 0.02 10*3/uL (ref 0.00–0.07)
Basophils Absolute: 0.1 10*3/uL (ref 0.0–0.1)
Basophils Relative: 1 %
Eosinophils Absolute: 0.2 10*3/uL (ref 0.0–0.5)
Eosinophils Relative: 3 %
HCT: 42.6 % (ref 36.0–46.0)
Hemoglobin: 13.5 g/dL (ref 12.0–15.0)
Immature Granulocytes: 0 %
Lymphocytes Relative: 15 %
Lymphs Abs: 0.9 10*3/uL (ref 0.7–4.0)
MCH: 29.9 pg (ref 26.0–34.0)
MCHC: 31.7 g/dL (ref 30.0–36.0)
MCV: 94.2 fL (ref 80.0–100.0)
Monocytes Absolute: 0.5 10*3/uL (ref 0.1–1.0)
Monocytes Relative: 9 %
Neutro Abs: 4.1 10*3/uL (ref 1.7–7.7)
Neutrophils Relative %: 72 %
Platelets: 160 10*3/uL (ref 150–400)
RBC: 4.52 MIL/uL (ref 3.87–5.11)
RDW: 15 % (ref 11.5–15.5)
WBC: 5.7 10*3/uL (ref 4.0–10.5)
nRBC: 0 % (ref 0.0–0.2)

## 2020-01-10 LAB — URINALYSIS, ROUTINE W REFLEX MICROSCOPIC
Bilirubin Urine: NEGATIVE
Glucose, UA: NEGATIVE mg/dL
Hgb urine dipstick: NEGATIVE
Ketones, ur: NEGATIVE mg/dL
Leukocytes,Ua: NEGATIVE
Nitrite: NEGATIVE
Protein, ur: NEGATIVE mg/dL
Specific Gravity, Urine: 1.013 (ref 1.005–1.030)
pH: 7 (ref 5.0–8.0)

## 2020-01-10 LAB — POC SARS CORONAVIRUS 2 AG -  ED: SARS Coronavirus 2 Ag: NEGATIVE

## 2020-01-10 LAB — TROPONIN I (HIGH SENSITIVITY): Troponin I (High Sensitivity): 8 ng/L (ref <18)

## 2020-01-10 MED ORDER — SODIUM CHLORIDE 0.9 % IV BOLUS
500.0000 mL | Freq: Once | INTRAVENOUS | Status: AC
  Administered 2020-01-10: 18:00:00 500 mL via INTRAVENOUS

## 2020-01-10 MED ORDER — LACTATED RINGERS IV BOLUS
500.0000 mL | Freq: Once | INTRAVENOUS | Status: AC
  Administered 2020-01-10: 18:00:00 500 mL via INTRAVENOUS

## 2020-01-10 NOTE — Discharge Instructions (Signed)
Lab results here show that you have dehydration. Everything else appears to be reassuring.  Please follow-up with your primary care doctor in 1 week.  Hydrate well, and return to the ER if your symptoms get worse.

## 2020-01-10 NOTE — ED Notes (Signed)
Put patient on bedpan. 

## 2020-01-10 NOTE — ED Notes (Signed)
Patient transported to CT 

## 2020-01-10 NOTE — ED Notes (Signed)
Got patient on the monitor did ekg shown to er doctor patient is resting with call bell in reach 

## 2020-01-10 NOTE — ED Provider Notes (Signed)
[facility] EMERGENCY DEPARTMENT Provider Note   CSN: [id number] Arrival date & time: 01/10/20  1248     History Chief Complaint  Patient presents with  . Weakness    Donna Owens is a 86 y.o. female history of anemia, coronary artery disease, hypertension, high cholesterol, migraines, presenting to the emergency department with a headache and malaise.  She reports onset of symptoms 2 days ago.  She does state that about a month ago she had a mechanical fall and fell backwards and struck the back of her head on the ground, with no loss of consciousness.  She has had no headaches since then.  Over 2 days ago she developed a frontal headache which is throbbing.  She denies that she regularly gets headaches, however her chart does document a history of migraines.  She denies any abdominal pain, vomiting, vision changes.  She denies any cough, congestion, shortness of breath.  She denies any diarrhea or dysuria.  She denies UTI symptoms.  She was reported she has felt generally malaised and weaker than normal for the past several days.  She feels that she has a poor appetite.  She says her daughter lives with her was concerned and wanted her to come to the emergency department to get "checked out".  HPI     Past Medical History:  Diagnosis Date  . Anemia   . Anxiety   . Arthritis    "back and right knee" (07/08/2018)  . Atrophic vaginitis   . Breast cancer, right breast ([place]) 2001  . Carotid arterial disease ([place]) 12/13/11    mild by doppler  . Chronic lower back pain   . CVA (cerebral vascular accident) [facility])    "dr never told me I had one" (07/08/2018)  . Depression   . Hyperlipemia   . Hypertension   . Migraine    "1-2/year" (07/08/2018)  . OSA (obstructive sleep apnea)   . OSA on CPAP 06/20/2014  . Osteoporosis   . Presence of permanent cardiac pacemaker 07/08/2018  . Pulmonary hypertension ([place])    moderate echo 11/07/10  . PVD (peripheral vascular  disease) ([place])   . Sleep apnea   . Type II diabetes mellitus [facility])     Patient Active Problem List   Diagnosis Date Noted  . Type 2 diabetes mellitus with diabetic peripheral angiopathy without gangrene, with long-term current use of insulin ([place]) 10/05/2019  . Ischemic chest pain ([place]) 10/05/2019  . Hypercholesterolemia 10/30/2018  . Symptomatic bradycardia 07/08/2018  . Pacemaker 07/08/2018  . Pressure injury of skin 10/13/2017  . Rhabdomyolysis 10/12/2017  . Elevated troponin 10/12/2017  . Confusion 10/12/2017  . Fall 10/12/2017  . Acute urinary retention 10/12/2017  . Carotid stenosis, asymptomatic, right 01/27/2017  . Frequent falls 01/23/2016  . 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 Atrial fibrillation with slow ventricular response ([place]) 01/21/2016  . Chronic diastolic heart failure ([place]) 01/21/2016  . Atrial flutter ([place]) 07/03/2014  . Chronic anticoagulation 07/03/2014  . PAD (peripheral artery disease) ([place]) 06/07/2013  . Essential hypertension 06/07/2013  . Dyslipidemia 06/07/2013  . Diabetes mellitus type 2 in nonobese [facility]) 06/24/2011    Past Surgical History:  Procedure Laterality Date  . BREAST LUMPECTOMY Right 07/26/2000   chemo/radiation  . BREAST LUMPECTOMY Left    benign  . CARDIOVERSION N/A 07/21/2014   Procedure: CARDIOVERSION;  Surgeon: Sanda Klein, MD;  Location: [facility] ENDOSCOPY;  Service: Cardiovascular;  Laterality: N/A;  . CATARACT EXTRACTION  W/ INTRAOCULAR LENS  IMPLANT, BILATERAL Bilateral    "not sure about left eye; ?lens"  . COLONOSCOPY  2010  . INSERT / REPLACE / REMOVE PACEMAKER  07/08/2018  . PACEMAKER IMPLANT N/A 07/08/2018   Procedure: PACEMAKER IMPLANT;  Surgeon: Sanda Klein, MD;  Location: [place] CV LAB;  Service: Cardiovascular;  Laterality: N/A;  . TOENAIL EXCISION  03/2013   ingrown nail     OB History    Gravida  4   Para  2   Term      Preterm      AB  2   Living  1      SAB  2   TAB      Ectopic      Multiple      Live Births              Family History  Problem Relation Age of Onset  . Diabetes Brother   . Hypertension Brother   . Heart failure Daughter 75  . Diabetes Other     Social History   Tobacco Use  . Smoking status: Never Smoker  . Smokeless tobacco: Never Used  Substance Use Topics  . Alcohol use: Never  . Drug use: Never    Home Medications Prior to Admission medications   Medication Sig Start Date End Date Taking? Authorizing Provider  acetaminophen (TYLENOL) 325 MG tablet Take 325 mg by mouth every 6 (six) hours as needed for mild pain or headache.    Yes [provider]  amLODipine (NORVASC) 5 MG tablet Take 1 tablet (5 mg total) by mouth daily. 11/01/19  Yes Croitoru, Mihai, MD  Calcium Carbonate-Vitamin D (CALCIUM-VITAMIN D3 PO) Take 1 tablet by mouth daily.   Yes [provider]  Cholecalciferol (VITAMIN D-3) 5000 units TABS Take 5,000 Units by mouth daily.    Yes [provider]  Coenzyme Q10 (CO Q 10) 100 MG CAPS Take 100 mg by mouth daily.    Yes [provider]  Cyanocobalamin (VITAMIN B-12) 2500 MCG SUBL Place 2,500 mcg under the tongue daily.   Yes [provider]  doxepin (SINEQUAN) 10 MG capsule Take 10 mg by mouth at bedtime.  03/08/19  Yes [provider]  ELIQUIS 2.5 MG TABS tablet TAKE 1 TABLET(2.5 MG) BY MOUTH TWICE DAILY Patient taking differently: Take 2.5 mg by mouth 2 (two) times daily.  02/15/19  Yes Croitoru, Mihai, MD  ferrous sulfate 325 (65 FE) MG tablet Take 325 mg by mouth daily with breakfast.  02/16/16  Yes [provider]  FLUoxetine (PROZAC) 10 MG capsule Take 10 mg by mouth daily.  07/30/19  Yes [provider]  furosemide (LASIX) 20 MG tablet Take 2 tablets (40 mg total) by mouth daily. Take 2 tablets daily until you reach 110lbs and then decrease to 1 tablet daily 12/16/19  Yes Croitoru, Mihai, MD  gabapentin (NEURONTIN)  100 MG capsule Take 1 capsule (100 mg total) by mouth at bedtime. 07/14/19  Yes Dohmeier, Asencion Partridge, MD  latanoprost (XALATAN) 0.005 % ophthalmic solution Place 1 drop into both eyes at bedtime.  09/25/19  Yes [provider]  memantine (NAMENDA) 10 MG tablet Take 1 tablet (10 mg total) by mouth 2 (two) times daily. 09/16/19  Yes Dohmeier, Asencion Partridge, MD  metFORMIN (GLUCOPHAGE) 500 MG tablet TAKE 1 TABLET BY MOUTH DAILY Patient taking differently: Take 500 mg by mouth daily with breakfast.  03/16/18  Yes Croitoru, Mihai, MD  MYRBETRIQ 50 MG  TB24 tablet Take 50 mg by mouth daily.  01/18/19  Yes [provider]  olmesartan-hydrochlorothiazide (BENICAR HCT) 20-12.5 MG tablet Take 1 tablet by mouth daily.  03/19/19  Yes [provider]  omega-3 acid ethyl esters (LOVAZA) 1 g capsule Take 1 g by mouth daily with breakfast.    Yes [provider]  Polyethyl Glycol-Propyl Glycol (SYSTANE) 0.4-0.3 % SOLN Place 2 drops into both eyes every 8 (eight) hours as needed (for dry eyes).    Yes [provider]  potassium chloride 20 MEQ TBCR Take 20 mEq by mouth daily. 12/16/19  Yes Croitoru, Mihai, MD  pravastatin (PRAVACHOL) 40 MG tablet Take 40 mg by mouth daily.   Yes [provider]  topiramate (TOPAMAX) 25 MG tablet Take 25 mg by mouth daily. 10/17/19  Yes [provider]    Allergies    Patient has no known allergies.  Review of Systems   Review of Systems  Constitutional: Positive for appetite change and fatigue. Negative for chills and fever.  HENT: Negative for congestion and ear pain.   Eyes: Negative for photophobia, redness and visual disturbance.  Respiratory: Negative for cough and shortness of breath.   Cardiovascular: Negative for chest pain and palpitations.  Gastrointestinal: Negative for abdominal pain, diarrhea, nausea and vomiting.  Musculoskeletal: Negative for arthralgias and back pain.  Skin: Negative for color change and rash.    Neurological: Positive for headaches. Negative for syncope.  Psychiatric/Behavioral: Negative for agitation and confusion.  All other systems reviewed and are negative.   Physical Exam Updated Vital Signs BP (!) 167/71 (BP Location: Right Arm)   Pulse (!) 59   Temp 98 F (36.7 C) (Oral)   Resp 18   SpO2 96%   Physical Exam Vitals and nursing note reviewed.  Constitutional:      General: She is not in acute distress.    Appearance: She is well-developed.  HENT:     Head: Normocephalic and atraumatic.  Eyes:     Conjunctiva/sclera: Conjunctivae normal.  Cardiovascular:     Rate and Rhythm: Normal rate and regular rhythm.     Pulses: Normal pulses.  Pulmonary:     Effort: Pulmonary effort is normal. No respiratory distress.     Breath sounds: Normal breath sounds.  Musculoskeletal:     Cervical back: Neck supple.  Skin:    General: Skin is warm and dry.  Neurological:     General: No focal deficit present.     Mental Status: She is alert and oriented to person, place, and time.     Cranial Nerves: No cranial nerve deficit.  Psychiatric:        Mood and Affect: Mood normal.        Behavior: Behavior normal.     ED Results / Procedures / Treatments   Labs (all labs ordered are listed, but only abnormal results are displayed) Labs Reviewed  BASIC METABOLIC PANEL - Abnormal; Notable for the following components:      Result Value   BUN 38 (*)    Creatinine, Ser 1.86 (*)    GFR calc non Af Amer 24 (*)    GFR calc Af Amer 27 (*)    All other components within normal limits  CBC WITH DIFFERENTIAL/PLATELET  URINALYSIS, ROUTINE W REFLEX MICROSCOPIC  POC SARS CORONAVIRUS 2 AG -  ED  TROPONIN I (HIGH SENSITIVITY)    EKG EKG Interpretation  Date/Time:  Monday January 10 2020 13:00:55 EST Ventricular Rate:  60 PR Interval:    QRS Duration: 146 QT Interval:  443 QTC Calculation: 443 R Axis:   153 Text Interpretation: Sinus rhythm Short PR interval Nonspecific  intraventricular conduction delay Lateral infarct, old Anterior infarct, possibly acute  No STEMI Confirmed by Octaviano Glow [phone]) on 01/10/2020 1:20:26 PM   Radiology CT Head Wo Contrast  Result Date: 01/10/2020 CLINICAL DATA:  Recurrent falls in a patient who is anticoagulated. Initial encounter. EXAM: CT HEAD WITHOUT CONTRAST TECHNIQUE: Contiguous axial images were obtained from the base of the skull through the vertex without intravenous contrast. COMPARISON:  Head CT scan 10/19/2019. FINDINGS: Brain: No evidence of acute infarction, hemorrhage, hydrocephalus, extra-axial collection or mass lesion/mass effect. Mild atrophy and chronic microvascular ischemic change noted. Vascular: Atherosclerosis. Skull: Intact.  No focal lesion Sinuses/Orbits: Negative. Other: None. IMPRESSION: No acute abnormality. Mild atrophy and chronic microvascular ischemic change. Atherosclerosis. Electronically Signed   By: Inge Rise M.D.   On: 01/10/2020 15:12    Procedures Procedures (including critical care time)  Medications Ordered in ED Medications  sodium chloride 0.9 % bolus 500 mL (0 mLs Intravenous Stopped 01/10/20 2040)  lactated ringers bolus 500 mL (0 mLs Intravenous Stopped 01/10/20 2040)    ED Course  I have reviewed the triage vital signs and the nursing notes.  Pertinent labs & imaging results that were available during my care of the patient were reviewed by me and considered in my medical decision making (see chart for details).  This is an 86 year old female presenting to emergency department with multiple symptoms.  She reports gradual onset frontal headache for the past 2 days.  This is the primary reason for her visit to the ED.  She also reports myalgia, fatigue, diminished appetite for the past several days to weeks.  She denies any chest pain or shortness of breath or difficulty breathing.  She appears quite comfortable and well on my physical exam.  Plan to check her  electrolytes and her hemoglobin level.  We will get a rapid Covid test.  Obtain a CT scan of the head given supportive new onset of headache.  Also check an EKG and a troponin, although my overall suspicion for acute coronary syndrome is fairly low.  We'll check a UA as well.  If her workup is benign I anticipate discharge home.  Donna Owens was evaluated in Emergency Department on 01/11/2020 for the symptoms described in the history of present illness. She was evaluated in the context of the global COVID-19 pandemic, which necessitated consideration that the patient might be at risk for infection with the SARS-CoV-2 virus that causes COVID-19. Institutional protocols and algorithms that pertain to the evaluation of patients at risk for COVID-19 are in a state of rapid change based on information released by regulatory bodies including the CDC and federal and state organizations. These policies and algorithms were followed during the patient's care in the ED.   Clinical Course as of Jan 10 617  Mon Jan 10, 2020  1551 Patient signed out to Dr Kathrynn Humble, plan to f/u on CBC and BMP, if unremarkable can discharge home.   [MT]  1552 Single troponin of 8 in the setting of multiple days of symptoms is not suggestive of ACS, do not believe we need to repeat at this time.   [MT]    Clinical Course User Index [MT] Elyzabeth Goatley, Carola Rhine, MD   Final Clinical Impression(s) / ED Diagnoses Final diagnoses:  Weakness  Dehydration    Rx /  DC Orders ED Discharge Orders    None       Nehemiah Montee, Carola Rhine, MD 01/11/20 [phone]

## 2020-01-10 NOTE — ED Provider Notes (Addendum)
  Physical Exam  BP (!) 169/64   Pulse (!) 50   Temp 98 F (36.7 C) (Oral)   Resp (!) 31   SpO2 100%   Physical Exam  ED Course/Procedures   Clinical Course as of Jan 14 1758  Mon Jan 10, 2020  1551 Patient signed out to Dr Donna Owens, plan to f/u on CBC and BMP, if unremarkable can discharge home.   [MT]  1552 Single troponin of 8 in the setting of multiple days of symptoms is not suggestive of ACS, do not believe we need to repeat at this time.   [MT]    Clinical Course User Index [MT] Donna Dusky, MD    Procedures  MDM  86 y/o with weakness, fatigue and anorexia from her home. Also has headache x 2 days.  CT brain is neg. She has hx of carotid artery disease and CVA, pulm HTN, HL.  Basic labs and CT ordered. If results are neg. Patient can be discharged.  Vitals:   01/10/20 1400 01/10/20 1415  BP: (!) 153/74 (!) 169/64  Pulse: 60 (!) 50  Resp: (!) 21 (!) 31  Temp:    SpO2: 100% 100%           Donna Biles, MD 01/10/20 Owens, Donna Molenda, MD 01/15/20 1759

## 2020-01-10 NOTE — ED Notes (Signed)
Updated patient's daughter.

## 2020-01-10 NOTE — ED Triage Notes (Signed)
Pt here from home via [facility] for weakness, abnormal gait and stuttering/slurred speech that has increasingly been getting worse for 6 mo per family. Pt has hx of dementia and alzheimers. EMS stroke screen negative, but family wanted pt to get checked out. Pt c/o headache for 2 days. VSS, Disoriented to time, aox3.

## 2020-01-13 DIAGNOSIS — E1122 Type 2 diabetes mellitus with diabetic chronic kidney disease: Secondary | ICD-10-CM | POA: Diagnosis not present

## 2020-01-13 DIAGNOSIS — D631 Anemia in chronic kidney disease: Secondary | ICD-10-CM | POA: Diagnosis not present

## 2020-01-13 DIAGNOSIS — I129 Hypertensive chronic kidney disease with stage 1 through stage 4 chronic kidney disease, or unspecified chronic kidney disease: Secondary | ICD-10-CM | POA: Diagnosis not present

## 2020-01-13 DIAGNOSIS — E1151 Type 2 diabetes mellitus with diabetic peripheral angiopathy without gangrene: Secondary | ICD-10-CM | POA: Diagnosis not present

## 2020-01-13 DIAGNOSIS — S81811D Laceration without foreign body, right lower leg, subsequent encounter: Secondary | ICD-10-CM | POA: Diagnosis not present

## 2020-01-13 DIAGNOSIS — N183 Chronic kidney disease, stage 3 unspecified: Secondary | ICD-10-CM | POA: Diagnosis not present

## 2020-01-14 DIAGNOSIS — E1122 Type 2 diabetes mellitus with diabetic chronic kidney disease: Secondary | ICD-10-CM | POA: Diagnosis not present

## 2020-01-14 DIAGNOSIS — E1151 Type 2 diabetes mellitus with diabetic peripheral angiopathy without gangrene: Secondary | ICD-10-CM | POA: Diagnosis not present

## 2020-01-14 DIAGNOSIS — I129 Hypertensive chronic kidney disease with stage 1 through stage 4 chronic kidney disease, or unspecified chronic kidney disease: Secondary | ICD-10-CM | POA: Diagnosis not present

## 2020-01-14 DIAGNOSIS — N183 Chronic kidney disease, stage 3 unspecified: Secondary | ICD-10-CM | POA: Diagnosis not present

## 2020-01-14 DIAGNOSIS — S81811D Laceration without foreign body, right lower leg, subsequent encounter: Secondary | ICD-10-CM | POA: Diagnosis not present

## 2020-01-14 DIAGNOSIS — D631 Anemia in chronic kidney disease: Secondary | ICD-10-CM | POA: Diagnosis not present

## 2020-01-17 DIAGNOSIS — E1151 Type 2 diabetes mellitus with diabetic peripheral angiopathy without gangrene: Secondary | ICD-10-CM | POA: Diagnosis not present

## 2020-01-17 DIAGNOSIS — E1122 Type 2 diabetes mellitus with diabetic chronic kidney disease: Secondary | ICD-10-CM | POA: Diagnosis not present

## 2020-01-17 DIAGNOSIS — S81811D Laceration without foreign body, right lower leg, subsequent encounter: Secondary | ICD-10-CM | POA: Diagnosis not present

## 2020-01-17 DIAGNOSIS — D631 Anemia in chronic kidney disease: Secondary | ICD-10-CM | POA: Diagnosis not present

## 2020-01-17 DIAGNOSIS — I129 Hypertensive chronic kidney disease with stage 1 through stage 4 chronic kidney disease, or unspecified chronic kidney disease: Secondary | ICD-10-CM | POA: Diagnosis not present

## 2020-01-17 DIAGNOSIS — N183 Chronic kidney disease, stage 3 unspecified: Secondary | ICD-10-CM | POA: Diagnosis not present

## 2020-01-18 DIAGNOSIS — E1122 Type 2 diabetes mellitus with diabetic chronic kidney disease: Secondary | ICD-10-CM | POA: Diagnosis not present

## 2020-01-18 DIAGNOSIS — E1151 Type 2 diabetes mellitus with diabetic peripheral angiopathy without gangrene: Secondary | ICD-10-CM | POA: Diagnosis not present

## 2020-01-18 DIAGNOSIS — F5104 Psychophysiologic insomnia: Secondary | ICD-10-CM | POA: Diagnosis not present

## 2020-01-18 DIAGNOSIS — S81811D Laceration without foreign body, right lower leg, subsequent encounter: Secondary | ICD-10-CM | POA: Diagnosis not present

## 2020-01-18 DIAGNOSIS — I87303 Chronic venous hypertension (idiopathic) without complications of bilateral lower extremity: Secondary | ICD-10-CM | POA: Diagnosis not present

## 2020-01-18 DIAGNOSIS — M1612 Unilateral primary osteoarthritis, left hip: Secondary | ICD-10-CM | POA: Diagnosis not present

## 2020-01-18 DIAGNOSIS — R339 Retention of urine, unspecified: Secondary | ICD-10-CM | POA: Diagnosis not present

## 2020-01-18 DIAGNOSIS — G4733 Obstructive sleep apnea (adult) (pediatric): Secondary | ICD-10-CM | POA: Diagnosis not present

## 2020-01-18 DIAGNOSIS — I4892 Unspecified atrial flutter: Secondary | ICD-10-CM | POA: Diagnosis not present

## 2020-01-18 DIAGNOSIS — G309 Alzheimer's disease, unspecified: Secondary | ICD-10-CM | POA: Diagnosis not present

## 2020-01-18 DIAGNOSIS — D631 Anemia in chronic kidney disease: Secondary | ICD-10-CM | POA: Diagnosis not present

## 2020-01-18 DIAGNOSIS — F0280 Dementia in other diseases classified elsewhere without behavioral disturbance: Secondary | ICD-10-CM | POA: Diagnosis not present

## 2020-01-18 DIAGNOSIS — F419 Anxiety disorder, unspecified: Secondary | ICD-10-CM | POA: Diagnosis not present

## 2020-01-18 DIAGNOSIS — M5117 Intervertebral disc disorders with radiculopathy, lumbosacral region: Secondary | ICD-10-CM | POA: Diagnosis not present

## 2020-01-18 DIAGNOSIS — I2510 Atherosclerotic heart disease of native coronary artery without angina pectoris: Secondary | ICD-10-CM | POA: Diagnosis not present

## 2020-01-18 DIAGNOSIS — L570 Actinic keratosis: Secondary | ICD-10-CM | POA: Diagnosis not present

## 2020-01-18 DIAGNOSIS — E46 Unspecified protein-calorie malnutrition: Secondary | ICD-10-CM | POA: Diagnosis not present

## 2020-01-18 DIAGNOSIS — E559 Vitamin D deficiency, unspecified: Secondary | ICD-10-CM | POA: Diagnosis not present

## 2020-01-18 DIAGNOSIS — F329 Major depressive disorder, single episode, unspecified: Secondary | ICD-10-CM | POA: Diagnosis not present

## 2020-01-18 DIAGNOSIS — I129 Hypertensive chronic kidney disease with stage 1 through stage 4 chronic kidney disease, or unspecified chronic kidney disease: Secondary | ICD-10-CM | POA: Diagnosis not present

## 2020-01-18 DIAGNOSIS — E785 Hyperlipidemia, unspecified: Secondary | ICD-10-CM | POA: Diagnosis not present

## 2020-01-18 DIAGNOSIS — H9193 Unspecified hearing loss, bilateral: Secondary | ICD-10-CM | POA: Diagnosis not present

## 2020-01-18 DIAGNOSIS — H6123 Impacted cerumen, bilateral: Secondary | ICD-10-CM | POA: Diagnosis not present

## 2020-01-18 DIAGNOSIS — I441 Atrioventricular block, second degree: Secondary | ICD-10-CM | POA: Diagnosis not present

## 2020-01-18 DIAGNOSIS — N183 Chronic kidney disease, stage 3 unspecified: Secondary | ICD-10-CM | POA: Diagnosis not present

## 2020-01-20 ENCOUNTER — Ambulatory Visit: Payer: Medicare Other | Admitting: Adult Health

## 2020-01-21 DIAGNOSIS — N183 Chronic kidney disease, stage 3 unspecified: Secondary | ICD-10-CM | POA: Diagnosis not present

## 2020-01-21 DIAGNOSIS — E1151 Type 2 diabetes mellitus with diabetic peripheral angiopathy without gangrene: Secondary | ICD-10-CM | POA: Diagnosis not present

## 2020-01-21 DIAGNOSIS — I129 Hypertensive chronic kidney disease with stage 1 through stage 4 chronic kidney disease, or unspecified chronic kidney disease: Secondary | ICD-10-CM | POA: Diagnosis not present

## 2020-01-21 DIAGNOSIS — E1122 Type 2 diabetes mellitus with diabetic chronic kidney disease: Secondary | ICD-10-CM | POA: Diagnosis not present

## 2020-01-21 DIAGNOSIS — S81811D Laceration without foreign body, right lower leg, subsequent encounter: Secondary | ICD-10-CM | POA: Diagnosis not present

## 2020-01-21 DIAGNOSIS — D631 Anemia in chronic kidney disease: Secondary | ICD-10-CM | POA: Diagnosis not present

## 2020-01-24 DIAGNOSIS — E1122 Type 2 diabetes mellitus with diabetic chronic kidney disease: Secondary | ICD-10-CM | POA: Diagnosis not present

## 2020-01-24 DIAGNOSIS — I129 Hypertensive chronic kidney disease with stage 1 through stage 4 chronic kidney disease, or unspecified chronic kidney disease: Secondary | ICD-10-CM | POA: Diagnosis not present

## 2020-01-24 DIAGNOSIS — D631 Anemia in chronic kidney disease: Secondary | ICD-10-CM | POA: Diagnosis not present

## 2020-01-24 DIAGNOSIS — E1151 Type 2 diabetes mellitus with diabetic peripheral angiopathy without gangrene: Secondary | ICD-10-CM | POA: Diagnosis not present

## 2020-01-24 DIAGNOSIS — N183 Chronic kidney disease, stage 3 unspecified: Secondary | ICD-10-CM | POA: Diagnosis not present

## 2020-01-24 DIAGNOSIS — S81811D Laceration without foreign body, right lower leg, subsequent encounter: Secondary | ICD-10-CM | POA: Diagnosis not present

## 2020-01-26 DIAGNOSIS — E1122 Type 2 diabetes mellitus with diabetic chronic kidney disease: Secondary | ICD-10-CM | POA: Diagnosis not present

## 2020-01-26 DIAGNOSIS — I129 Hypertensive chronic kidney disease with stage 1 through stage 4 chronic kidney disease, or unspecified chronic kidney disease: Secondary | ICD-10-CM | POA: Diagnosis not present

## 2020-01-26 DIAGNOSIS — S81811D Laceration without foreign body, right lower leg, subsequent encounter: Secondary | ICD-10-CM | POA: Diagnosis not present

## 2020-01-26 DIAGNOSIS — N183 Chronic kidney disease, stage 3 unspecified: Secondary | ICD-10-CM | POA: Diagnosis not present

## 2020-01-26 DIAGNOSIS — E1151 Type 2 diabetes mellitus with diabetic peripheral angiopathy without gangrene: Secondary | ICD-10-CM | POA: Diagnosis not present

## 2020-01-26 DIAGNOSIS — D631 Anemia in chronic kidney disease: Secondary | ICD-10-CM | POA: Diagnosis not present

## 2020-01-27 DIAGNOSIS — H10413 Chronic giant papillary conjunctivitis, bilateral: Secondary | ICD-10-CM | POA: Diagnosis not present

## 2020-01-27 DIAGNOSIS — E119 Type 2 diabetes mellitus without complications: Secondary | ICD-10-CM | POA: Diagnosis not present

## 2020-01-27 DIAGNOSIS — H401131 Primary open-angle glaucoma, bilateral, mild stage: Secondary | ICD-10-CM | POA: Diagnosis not present

## 2020-01-27 DIAGNOSIS — H04123 Dry eye syndrome of bilateral lacrimal glands: Secondary | ICD-10-CM | POA: Diagnosis not present

## 2020-01-27 DIAGNOSIS — Z961 Presence of intraocular lens: Secondary | ICD-10-CM | POA: Diagnosis not present

## 2020-01-28 DIAGNOSIS — E1122 Type 2 diabetes mellitus with diabetic chronic kidney disease: Secondary | ICD-10-CM | POA: Diagnosis not present

## 2020-01-28 DIAGNOSIS — S81811D Laceration without foreign body, right lower leg, subsequent encounter: Secondary | ICD-10-CM | POA: Diagnosis not present

## 2020-01-28 DIAGNOSIS — D631 Anemia in chronic kidney disease: Secondary | ICD-10-CM | POA: Diagnosis not present

## 2020-01-28 DIAGNOSIS — N183 Chronic kidney disease, stage 3 unspecified: Secondary | ICD-10-CM | POA: Diagnosis not present

## 2020-01-28 DIAGNOSIS — I129 Hypertensive chronic kidney disease with stage 1 through stage 4 chronic kidney disease, or unspecified chronic kidney disease: Secondary | ICD-10-CM | POA: Diagnosis not present

## 2020-01-28 DIAGNOSIS — E1151 Type 2 diabetes mellitus with diabetic peripheral angiopathy without gangrene: Secondary | ICD-10-CM | POA: Diagnosis not present

## 2020-01-31 DIAGNOSIS — D631 Anemia in chronic kidney disease: Secondary | ICD-10-CM | POA: Diagnosis not present

## 2020-01-31 DIAGNOSIS — E1151 Type 2 diabetes mellitus with diabetic peripheral angiopathy without gangrene: Secondary | ICD-10-CM | POA: Diagnosis not present

## 2020-01-31 DIAGNOSIS — N183 Chronic kidney disease, stage 3 unspecified: Secondary | ICD-10-CM | POA: Diagnosis not present

## 2020-01-31 DIAGNOSIS — I129 Hypertensive chronic kidney disease with stage 1 through stage 4 chronic kidney disease, or unspecified chronic kidney disease: Secondary | ICD-10-CM | POA: Diagnosis not present

## 2020-01-31 DIAGNOSIS — S81811D Laceration without foreign body, right lower leg, subsequent encounter: Secondary | ICD-10-CM | POA: Diagnosis not present

## 2020-01-31 DIAGNOSIS — E1122 Type 2 diabetes mellitus with diabetic chronic kidney disease: Secondary | ICD-10-CM | POA: Diagnosis not present

## 2020-02-02 DIAGNOSIS — E1122 Type 2 diabetes mellitus with diabetic chronic kidney disease: Secondary | ICD-10-CM | POA: Diagnosis not present

## 2020-02-02 DIAGNOSIS — N183 Chronic kidney disease, stage 3 unspecified: Secondary | ICD-10-CM | POA: Diagnosis not present

## 2020-02-02 DIAGNOSIS — D631 Anemia in chronic kidney disease: Secondary | ICD-10-CM | POA: Diagnosis not present

## 2020-02-02 DIAGNOSIS — E1151 Type 2 diabetes mellitus with diabetic peripheral angiopathy without gangrene: Secondary | ICD-10-CM | POA: Diagnosis not present

## 2020-02-02 DIAGNOSIS — S81811D Laceration without foreign body, right lower leg, subsequent encounter: Secondary | ICD-10-CM | POA: Diagnosis not present

## 2020-02-02 DIAGNOSIS — I129 Hypertensive chronic kidney disease with stage 1 through stage 4 chronic kidney disease, or unspecified chronic kidney disease: Secondary | ICD-10-CM | POA: Diagnosis not present

## 2020-02-03 ENCOUNTER — Ambulatory Visit (INDEPENDENT_AMBULATORY_CARE_PROVIDER_SITE_OTHER): Payer: Medicare Other | Admitting: *Deleted

## 2020-02-03 DIAGNOSIS — I4891 Unspecified atrial fibrillation: Secondary | ICD-10-CM

## 2020-02-03 LAB — CUP PACEART REMOTE DEVICE CHECK
Battery Remaining Longevity: 121 mo
Battery Remaining Percentage: 95.5 %
Battery Voltage: 3.02 V
Brady Statistic RV Percent Paced: 93 %
Date Time Interrogation Session: 20210210020015
Implantable Lead Implant Date: 20190717
Implantable Lead Location: 753860
Implantable Pulse Generator Implant Date: 20190717
Lead Channel Impedance Value: 480 Ohm
Lead Channel Pacing Threshold Amplitude: 0.5 V
Lead Channel Pacing Threshold Pulse Width: 0.5 ms
Lead Channel Sensing Intrinsic Amplitude: 12 mV
Lead Channel Setting Pacing Amplitude: 2.5 V
Lead Channel Setting Pacing Pulse Width: 0.5 ms
Lead Channel Setting Sensing Sensitivity: 2 mV
Pulse Gen Model: 1272
Pulse Gen Serial Number: 9002800

## 2020-02-04 DIAGNOSIS — E1122 Type 2 diabetes mellitus with diabetic chronic kidney disease: Secondary | ICD-10-CM | POA: Diagnosis not present

## 2020-02-04 DIAGNOSIS — E1151 Type 2 diabetes mellitus with diabetic peripheral angiopathy without gangrene: Secondary | ICD-10-CM | POA: Diagnosis not present

## 2020-02-04 DIAGNOSIS — D631 Anemia in chronic kidney disease: Secondary | ICD-10-CM | POA: Diagnosis not present

## 2020-02-04 DIAGNOSIS — I129 Hypertensive chronic kidney disease with stage 1 through stage 4 chronic kidney disease, or unspecified chronic kidney disease: Secondary | ICD-10-CM | POA: Diagnosis not present

## 2020-02-04 DIAGNOSIS — N183 Chronic kidney disease, stage 3 unspecified: Secondary | ICD-10-CM | POA: Diagnosis not present

## 2020-02-04 DIAGNOSIS — S81811D Laceration without foreign body, right lower leg, subsequent encounter: Secondary | ICD-10-CM | POA: Diagnosis not present

## 2020-02-04 NOTE — Progress Notes (Signed)
PPM Remote  

## 2020-02-07 DIAGNOSIS — I129 Hypertensive chronic kidney disease with stage 1 through stage 4 chronic kidney disease, or unspecified chronic kidney disease: Secondary | ICD-10-CM | POA: Diagnosis not present

## 2020-02-07 DIAGNOSIS — D631 Anemia in chronic kidney disease: Secondary | ICD-10-CM | POA: Diagnosis not present

## 2020-02-07 DIAGNOSIS — E1122 Type 2 diabetes mellitus with diabetic chronic kidney disease: Secondary | ICD-10-CM | POA: Diagnosis not present

## 2020-02-07 DIAGNOSIS — S81811D Laceration without foreign body, right lower leg, subsequent encounter: Secondary | ICD-10-CM | POA: Diagnosis not present

## 2020-02-07 DIAGNOSIS — E1151 Type 2 diabetes mellitus with diabetic peripheral angiopathy without gangrene: Secondary | ICD-10-CM | POA: Diagnosis not present

## 2020-02-07 DIAGNOSIS — N183 Chronic kidney disease, stage 3 unspecified: Secondary | ICD-10-CM | POA: Diagnosis not present

## 2020-02-12 DIAGNOSIS — D631 Anemia in chronic kidney disease: Secondary | ICD-10-CM | POA: Diagnosis not present

## 2020-02-12 DIAGNOSIS — I129 Hypertensive chronic kidney disease with stage 1 through stage 4 chronic kidney disease, or unspecified chronic kidney disease: Secondary | ICD-10-CM | POA: Diagnosis not present

## 2020-02-12 DIAGNOSIS — E1122 Type 2 diabetes mellitus with diabetic chronic kidney disease: Secondary | ICD-10-CM | POA: Diagnosis not present

## 2020-02-12 DIAGNOSIS — S81811D Laceration without foreign body, right lower leg, subsequent encounter: Secondary | ICD-10-CM | POA: Diagnosis not present

## 2020-02-12 DIAGNOSIS — E1151 Type 2 diabetes mellitus with diabetic peripheral angiopathy without gangrene: Secondary | ICD-10-CM | POA: Diagnosis not present

## 2020-02-12 DIAGNOSIS — N183 Chronic kidney disease, stage 3 unspecified: Secondary | ICD-10-CM | POA: Diagnosis not present

## 2020-02-14 DIAGNOSIS — I129 Hypertensive chronic kidney disease with stage 1 through stage 4 chronic kidney disease, or unspecified chronic kidney disease: Secondary | ICD-10-CM | POA: Diagnosis not present

## 2020-02-14 DIAGNOSIS — E1122 Type 2 diabetes mellitus with diabetic chronic kidney disease: Secondary | ICD-10-CM | POA: Diagnosis not present

## 2020-02-14 DIAGNOSIS — S81811D Laceration without foreign body, right lower leg, subsequent encounter: Secondary | ICD-10-CM | POA: Diagnosis not present

## 2020-02-14 DIAGNOSIS — D631 Anemia in chronic kidney disease: Secondary | ICD-10-CM | POA: Diagnosis not present

## 2020-02-14 DIAGNOSIS — N183 Chronic kidney disease, stage 3 unspecified: Secondary | ICD-10-CM | POA: Diagnosis not present

## 2020-02-14 DIAGNOSIS — E1151 Type 2 diabetes mellitus with diabetic peripheral angiopathy without gangrene: Secondary | ICD-10-CM | POA: Diagnosis not present

## 2020-02-15 ENCOUNTER — Telehealth: Payer: Self-pay | Admitting: Cardiovascular Disease

## 2020-02-15 NOTE — Telephone Encounter (Signed)
They can travel, Just make sure to stretch legs often (she is relatively protected from DVT by Eliquis anyway, though) and avoid (convenient but salty) fast food. They do not need to take the monitor with them. Please follow all COVID-19 protection measures!

## 2020-02-15 NOTE — Telephone Encounter (Signed)
Routed to MD for review.

## 2020-02-15 NOTE — Telephone Encounter (Signed)
New Message   Pt's daughter called and wanted to ask Dr. Sallyanne Kuster if pt is ok to travel. She said that they are driving to [place] to attend a wedding and they will stay there for 4 days. She also wanted to know If they need to bring pt's heart monitor.   Please advise

## 2020-02-15 NOTE — Telephone Encounter (Signed)
Spoke to daughter-aware of recommendations and verbalized understanding.    

## 2020-02-21 ENCOUNTER — Ambulatory Visit: Payer: Medicare Other | Attending: Internal Medicine

## 2020-02-21 DIAGNOSIS — Z23 Encounter for immunization: Secondary | ICD-10-CM | POA: Insufficient documentation

## 2020-02-21 NOTE — Progress Notes (Signed)
   Covid-19 Vaccination Clinic  Name:  Donna Owens    MRN: [id number] DOB: [birth year]  02/21/2020  [name] was observed post Covid-19 immunization for 15 minutes without incidence. She was provided with Vaccine Information Sheet and instruction to access the V-Safe system.   [name] was instructed to call 911 with any severe reactions post vaccine: [place] Kitchen Difficulty breathing  . Swelling of your face and throat  . A fast heartbeat  . A bad rash all over your body  . Dizziness and weakness    Immunizations Administered    Name Date Dose VIS Date Route   Pfizer COVID-19 Vaccine 02/21/2020  2:52 PM 0.3 mL 12/03/2019 Intramuscular   Manufacturer: [place]   Lot: [id number]   [place]: [id number]

## 2020-02-29 DIAGNOSIS — I739 Peripheral vascular disease, unspecified: Secondary | ICD-10-CM | POA: Diagnosis not present

## 2020-03-15 ENCOUNTER — Ambulatory Visit: Payer: Medicare Other | Attending: Internal Medicine

## 2020-03-15 DIAGNOSIS — I700 Atherosclerosis of aorta: Secondary | ICD-10-CM | POA: Diagnosis not present

## 2020-03-15 DIAGNOSIS — Z23 Encounter for immunization: Secondary | ICD-10-CM

## 2020-03-15 NOTE — Progress Notes (Signed)
   Covid-19 Vaccination Clinic  Name:  Donna Owens    MRN: [id number] DOB: 03-17-1928  03/15/2020  Ms. Donna Owens was observed post Covid-19 immunization for 15 minutes without incident. She was provided with Vaccine Information Sheet and instruction to access the V-Safe system.   Ms. Donna Owens was instructed to call 911 with any severe reactions post vaccine: Marland Kitchen Difficulty breathing  . Swelling of face and throat  . A fast heartbeat  . A bad rash all over body  . Dizziness and weakness   Immunizations Administered    Name Date Dose VIS Date Route   Pfizer COVID-19 Vaccine 03/15/2020  2:48 PM 0.3 mL 12/03/2019 Intramuscular   Manufacturer: [place]   Lot: [id number]   [place]: [id number]

## 2020-03-20 DIAGNOSIS — E46 Unspecified protein-calorie malnutrition: Secondary | ICD-10-CM | POA: Diagnosis not present

## 2020-03-20 DIAGNOSIS — I700 Atherosclerosis of aorta: Secondary | ICD-10-CM | POA: Diagnosis not present

## 2020-03-20 DIAGNOSIS — I87313 Chronic venous hypertension (idiopathic) with ulcer of bilateral lower extremity: Secondary | ICD-10-CM | POA: Diagnosis not present

## 2020-03-20 DIAGNOSIS — K921 Melena: Secondary | ICD-10-CM | POA: Diagnosis not present

## 2020-03-27 DIAGNOSIS — I700 Atherosclerosis of aorta: Secondary | ICD-10-CM | POA: Diagnosis not present

## 2020-03-27 DIAGNOSIS — I4892 Unspecified atrial flutter: Secondary | ICD-10-CM | POA: Diagnosis not present

## 2020-04-05 ENCOUNTER — Ambulatory Visit: Payer: Medicare Other | Admitting: Adult Health

## 2020-04-18 DIAGNOSIS — E1151 Type 2 diabetes mellitus with diabetic peripheral angiopathy without gangrene: Secondary | ICD-10-CM | POA: Diagnosis not present

## 2020-04-18 DIAGNOSIS — G4733 Obstructive sleep apnea (adult) (pediatric): Secondary | ICD-10-CM | POA: Diagnosis not present

## 2020-04-18 DIAGNOSIS — G309 Alzheimer's disease, unspecified: Secondary | ICD-10-CM | POA: Diagnosis not present

## 2020-04-18 DIAGNOSIS — I87313 Chronic venous hypertension (idiopathic) with ulcer of bilateral lower extremity: Secondary | ICD-10-CM | POA: Diagnosis not present

## 2020-04-18 DIAGNOSIS — I679 Cerebrovascular disease, unspecified: Secondary | ICD-10-CM | POA: Diagnosis not present

## 2020-04-18 DIAGNOSIS — E46 Unspecified protein-calorie malnutrition: Secondary | ICD-10-CM | POA: Diagnosis not present

## 2020-04-18 DIAGNOSIS — R269 Unspecified abnormalities of gait and mobility: Secondary | ICD-10-CM | POA: Diagnosis not present

## 2020-04-18 DIAGNOSIS — I739 Peripheral vascular disease, unspecified: Secondary | ICD-10-CM | POA: Diagnosis not present

## 2020-04-25 ENCOUNTER — Ambulatory Visit (INDEPENDENT_AMBULATORY_CARE_PROVIDER_SITE_OTHER): Payer: Medicare Other | Admitting: Adult Health

## 2020-04-25 ENCOUNTER — Other Ambulatory Visit: Payer: Self-pay

## 2020-04-25 ENCOUNTER — Encounter: Payer: Self-pay | Admitting: Adult Health

## 2020-04-25 VITALS — BP 146/74 | HR 63 | Temp 97.2°F | Ht 65.0 in | Wt 122.0 lb

## 2020-04-25 DIAGNOSIS — F0280 Dementia in other diseases classified elsewhere without behavioral disturbance: Secondary | ICD-10-CM

## 2020-04-25 DIAGNOSIS — G309 Alzheimer's disease, unspecified: Secondary | ICD-10-CM

## 2020-04-25 MED ORDER — MEMANTINE HCL 10 MG PO TABS: 10.0000 mg | ORAL_TABLET | Freq: Two times a day (BID) | ORAL | 11 refills | Status: AC

## 2020-04-25 NOTE — Progress Notes (Signed)
PATIENT: Donna Owens DOB: [birth year]  REASON FOR VISIT: follow up HISTORY FROM: patient  HISTORY OF PRESENT ILLNESS: Today 04/25/20:  Donna Owens is a 86 year old female with a history of Alzheimer's disease.  She returns today for follow-up.  She lives at her home with her Donna Owens.  Reports that she is able to complete all ADLs independently.  She no longer operates a motor vehicle.  Her Donna Owens manages her medications and appointments.  She also helps her with her finances.  Donna Owens reports that she is moody.  Reports that she constantly thinks that she or her husband is moving her stuff.  Donna Owens reports that she does not sleep well at night.  Reports that she will not take the gabapentin consistently as she feels that it may cause her not to wake up.  She returns today for an evaluation.  HISTORY (Copied from Dr.Dohmeier's note) 10-05-2019. The patient is defiant, a little irritated. Her Donna Owens has been moving to her mothers home and helping with care, housekeeping. The patient is an 86 year old african -[place] female with paranoid ideas.  She forgets information, repeatedly asking the same questions and making the same comments, and misplaces things, and feels strongly that someone is doing it " to her".   She is quite frustrated is blaming a Donna Owens to break into her house, suspecting her to have a copy of her keys. Medication is regularly misplaced. Will need a pill p box- she is losing weight and needs food prepared for her.   REVIEW OF SYSTEMS: Out of a complete 14 system review of symptoms, the patient complains only of the following symptoms, and all other reviewed systems are negative.  See HPI  ALLERGIES: No Known Allergies  HOME MEDICATIONS: Outpatient Medications Prior to Visit  Medication Sig Dispense Refill  . acetaminophen (TYLENOL) 325 MG tablet Take 325 mg by mouth every 6 (six) hours as needed for mild pain or headache.     Marland Kitchen amLODipine (NORVASC) 5  MG tablet Take 1 tablet (5 mg total) by mouth daily. 90 tablet 0  . Calcium Carbonate-Vitamin D (CALCIUM-VITAMIN D3 PO) Take 1 tablet by mouth daily.    . Cholecalciferol (VITAMIN D-3) 5000 units TABS Take 5,000 Units by mouth daily.     . Coenzyme Q10 (CO Q 10) 100 MG CAPS Take 100 mg by mouth daily.     . Cyanocobalamin (VITAMIN B-12) 2500 MCG SUBL Place 2,500 mcg under the tongue daily.    Marland Kitchen doxepin (SINEQUAN) 10 MG capsule Take 10 mg by mouth at bedtime.     Marland Kitchen ELIQUIS 2.5 MG TABS tablet TAKE 1 TABLET(2.5 MG) BY MOUTH TWICE DAILY (Patient taking differently: Take 2.5 mg by mouth 2 (two) times daily. ) 180 tablet 2  . ferrous sulfate 325 (65 FE) MG tablet Take 325 mg by mouth daily with breakfast.   12  . FLUoxetine (PROZAC) 10 MG capsule Take 10 mg by mouth daily.     . furosemide (LASIX) 20 MG tablet Take 2 tablets (40 mg total) by mouth daily. Take 2 tablets daily until you reach 110lbs and then decrease to 1 tablet daily 90 tablet 3  . gabapentin (NEURONTIN) 100 MG capsule Take 1 capsule (100 mg total) by mouth at bedtime. 90 capsule 3  . latanoprost (XALATAN) 0.005 % ophthalmic solution Place 1 drop into both eyes at bedtime.     . memantine (NAMENDA) 10 MG tablet Take 1 tablet (10 mg total) by mouth 2 (  two) times daily. 60 tablet 11  . metFORMIN (GLUCOPHAGE) 500 MG tablet TAKE 1 TABLET BY MOUTH DAILY (Patient taking differently: Take 500 mg by mouth daily with breakfast. ) 30 tablet 9  . MYRBETRIQ 50 MG TB24 tablet Take 50 mg by mouth daily.     Marland Kitchen olmesartan-hydrochlorothiazide (BENICAR HCT) 20-12.5 MG tablet Take 1 tablet by mouth daily.     Marland Kitchen omega-3 acid ethyl esters (LOVAZA) 1 g capsule Take 1 g by mouth daily with breakfast.     . Polyethyl Glycol-Propyl Glycol (SYSTANE) 0.4-0.3 % SOLN Place 2 drops into both eyes every 8 (eight) hours as needed (for dry eyes).     . potassium chloride 20 MEQ TBCR Take 20 mEq by mouth daily. 90 tablet 3  . pravastatin (PRAVACHOL) 40 MG tablet Take 40  mg by mouth daily.    Marland Kitchen topiramate (TOPAMAX) 25 MG tablet Take 25 mg by mouth daily.     No facility-administered medications prior to visit.    PAST MEDICAL HISTORY: Past Medical History:  Diagnosis Date  . Anemia   . Anxiety   . Arthritis    "back and right knee" (07/08/2018)  . Atrophic vaginitis   . Breast cancer, right breast ([place]) 2001  . Carotid arterial disease ([place]) 12/13/11    mild by doppler  . Chronic lower back pain   . CVA (cerebral vascular accident) [facility])    "dr never told me I had one" (07/08/2018)  . Depression   . Hyperlipemia   . Hypertension   . Migraine    "1-2/year" (07/08/2018)  . OSA (obstructive sleep apnea)   . OSA on CPAP 06/20/2014  . Osteoporosis   . Presence of permanent cardiac pacemaker 07/08/2018  . Pulmonary hypertension ([place])    moderate echo 11/07/10  . PVD (peripheral vascular disease) ([place])   . Sleep apnea   . Type II diabetes mellitus ([place])     PAST SURGICAL HISTORY: Past Surgical History:  Procedure Laterality Date  . BREAST LUMPECTOMY Right 07/26/2000   chemo/radiation  . BREAST LUMPECTOMY Left    benign  . CARDIOVERSION N/A 07/21/2014   Procedure: CARDIOVERSION;  Surgeon: Sanda Klein, MD;  Location: [facility];  Service: Cardiovascular;  Laterality: N/A;  . CATARACT EXTRACTION W/ INTRAOCULAR LENS  IMPLANT, BILATERAL Bilateral    "not sure about left eye; ?lens"  . COLONOSCOPY  2010  . INSERT / REPLACE / REMOVE PACEMAKER  07/08/2018  . PACEMAKER IMPLANT N/A 07/08/2018   Procedure: PACEMAKER IMPLANT;  Surgeon: Sanda Klein, MD;  Location: [facility];  Service: Cardiovascular;  Laterality: N/A;  . TOENAIL EXCISION  03/2013   ingrown nail    FAMILY HISTORY: Family History  Problem Relation Age of Onset  . Diabetes Brother   . Hypertension Brother   . Heart failure Donna Owens 61  . Diabetes Other     SOCIAL HISTORY: Social History   Socioeconomic History  . Marital status: Divorced    Spouse name: Not  on file  . Number of children: 1  . Years of education: BSN  . Highest education level: Not on file  Occupational History  . Occupation: RETIRED    Employer: RETIRED  Tobacco Use  . Smoking status: Never Smoker  . Smokeless tobacco: Never Used  Substance and Sexual Activity  . Alcohol use: Never  . Drug use: Never  . Sexual activity: Not on file  Other Topics Concern  . Not on file  Social History Narrative  Patient is divorced and lives alone.   Patient has one adult Donna Owens.   Patient is retired.   Patient has a BSN degree in nursing.   Patient is right-handed.   Patient drinks one soda per week.   Social Determinants of Health   Financial Resource Strain:   . Difficulty of Paying Living Expenses:   Food Insecurity:   . Worried About Charity fundraiser in the Last Year:   . Arboriculturist in the Last Year:   Transportation Needs:   . Film/video editor (Medical):   Marland Kitchen Lack of Transportation (Non-Medical):   Physical Activity:   . Days of Exercise per Week:   . Minutes of Exercise per Session:   Stress:   . Feeling of Stress :   Social Connections:   . Frequency of Communication with Friends and Family:   . Frequency of Social Gatherings with Friends and Family:   . Attends Religious Services:   . Active Member of Clubs or Organizations:   . Attends Archivist Meetings:   Marland Kitchen Marital Status:   Intimate Partner Violence:   . Fear of Current or Ex-Partner:   . Emotionally Abused:   Marland Kitchen Physically Abused:   . Sexually Abused:       PHYSICAL EXAM  Vitals:   04/25/20 0846  BP: (!) 146/74  Pulse: 63  Temp: (!) 97.2 F (36.2 C)  Weight: 122 lb (55.3 kg)  Height: 5\' 5"  (1.651 m)   Body mass index is 20.3 kg/m.   MMSE - Mini Mental State Exam 04/25/2020 10/05/2019 07/14/2019  Not completed: - - -  Orientation to time 4 4 4   Orientation to Place 3 4 3   Registration 3 3 3   Attention/ Calculation 0 0 0  Attention/Calculation-comments - - -    Recall 0 0 0  Language- name 2 objects 2 2 1   Language- repeat 1 1 1   Language- follow 3 step command 3 3 2   Language- read & follow direction 1 0 1  Write a sentence 1 1 0  Copy design 0 0 0  Total score 18 18 15     Generalized: Well developed, in no acute distress   Neurological examination  Mentation: Alert oriented to time, place, history taking. Follows all commands speech and language fluent Cranial nerve II-XII: Pupils were equal round reactive to light. Extraocular movements were full, visual field were full on confrontational test. . Head turning and shoulder shrug  were normal and symmetric. Motor: The motor testing reveals 5 over 5 strength of all 4 extremities. Good symmetric motor tone is noted throughout.  Sensory: Sensory testing is intact to soft touch on all 4 extremities. No evidence of extinction is noted.  Coordination: Cerebellar testing reveals good finger-nose-finger and heel-to-shin bilaterally.  Gait and station: Patient uses a cane when ambulating.  Gait is slightly unsteady.   DIAGNOSTIC DATA (LABS, IMAGING, TESTING) - I reviewed patient records, labs, notes, testing and imaging myself where available.  Lab Results  Component Value Date   WBC 5.7 01/10/2020   HGB 13.5 01/10/2020   HCT 42.6 01/10/2020   MCV 94.2 01/10/2020   PLT 160 01/10/2020      Component Value Date/Time   NA 138 01/10/2020 1420   NA 141 12/22/2019 1115   K 4.3 01/10/2020 1420   CL 99 01/10/2020 1420   CO2 25 01/10/2020 1420   GLUCOSE 77 01/10/2020 1420   BUN 38 (H) 01/10/2020 1420  BUN 23 12/22/2019 1115   CREATININE 1.86 (H) 01/10/2020 1420   CREATININE 1.23 (H) 10/05/2015 1151   CALCIUM 10.0 01/10/2020 1420   PROT 7.5 06/04/2019 1145   ALBUMIN 4.3 06/04/2019 1145   AST 20 06/04/2019 1145   ALT 9 06/04/2019 1145   ALKPHOS 64 06/04/2019 1145   BILITOT 0.6 06/04/2019 1145   GFRNONAA 24 (L) 01/10/2020 1420   GFRNONAA 45 (L) 06/29/2014 1607   GFRAA 27 (L) 01/10/2020  1420   GFRAA 52 (L) 06/29/2014 1607   Lab Results  Component Value Date   CHOL 163 06/04/2019   HDL 74 06/04/2019   LDLCALC 80 06/04/2019   TRIG 46 06/04/2019   CHOLHDL 2.2 06/04/2019   Lab Results  Component Value Date   HGBA1C 5.4 02/02/2014   Lab Results  Component Value Date   VITAMINB12 3,005 (H) 10/13/2017   Lab Results  Component Value Date   TSH 1.630 06/04/2019      ASSESSMENT AND PLAN 86 y.o. year old female  has a past medical history of Anemia, Anxiety, Arthritis, Atrophic vaginitis, Breast cancer, right breast ([place]) (2001), Carotid arterial disease ([place]) (12/13/11), Chronic lower back pain, CVA (cerebral vascular accident) ([place]), Depression, Hyperlipemia, Hypertension, Migraine, OSA (obstructive sleep apnea), OSA on CPAP (06/20/2014), Osteoporosis, Presence of permanent cardiac pacemaker (07/08/2018), Pulmonary hypertension ([place]), PVD (peripheral vascular disease) ([place]), Sleep apnea, and Type II diabetes mellitus ([place]). here with:  1.  Alzheimer's disease  -Continue Namenda -MMSE stable 18 out of 30 -Had a long discussion with the Donna Owens about using distraction techniques during patient mood swings. -Advised if symptoms worsen or she develops new symptoms she should let us know -Follow-up in 6 months or sooner if needed  I spent 25 minutes of face-to-face and non-face-to-face time with patient.  This included previsit chart review, lab review, study review, order entry, electronic health record documentation, patient education.  Ward Givens, MSN, NP-C 04/25/2020, 9:02 AM [facility] [address] [place], [place] [address] [phone]

## 2020-04-25 NOTE — Patient Instructions (Signed)
Your Plan:  Continue Namenda MMSE 18/30- stable If your symptoms worsen or you develop new symptoms please let us know.    Thank you for coming to see [place] at [facility]. I hope we have been able to provide you high quality care today.  You may receive a patient satisfaction survey over the next few weeks. We would appreciate your feedback and comments so that we may continue to improve ourselves and the health of our patients.

## 2020-05-02 ENCOUNTER — Other Ambulatory Visit: Payer: Self-pay

## 2020-05-02 MED ORDER — AMLODIPINE BESYLATE 5 MG PO TABS: 5.0000 mg | ORAL_TABLET | Freq: Every day | ORAL | 0 refills | Status: AC

## 2020-05-04 ENCOUNTER — Ambulatory Visit (INDEPENDENT_AMBULATORY_CARE_PROVIDER_SITE_OTHER): Payer: Medicare Other | Admitting: *Deleted

## 2020-05-04 DIAGNOSIS — R001 Bradycardia, unspecified: Secondary | ICD-10-CM | POA: Diagnosis not present

## 2020-05-04 LAB — CUP PACEART REMOTE DEVICE CHECK
Battery Remaining Longevity: 122 mo
Battery Remaining Percentage: 95.5 %
Battery Voltage: 3.02 V
Brady Statistic RV Percent Paced: 92 %
Date Time Interrogation Session: 20210513020013
Implantable Lead Implant Date: 20190717
Implantable Lead Location: 753860
Implantable Pulse Generator Implant Date: 20190717
Lead Channel Impedance Value: 490 Ohm
Lead Channel Pacing Threshold Amplitude: 0.5 V
Lead Channel Pacing Threshold Pulse Width: 0.5 ms
Lead Channel Sensing Intrinsic Amplitude: 12 mV
Lead Channel Setting Pacing Amplitude: 2.5 V
Lead Channel Setting Pacing Pulse Width: 0.5 ms
Lead Channel Setting Sensing Sensitivity: 2 mV
Pulse Gen Model: 1272
Pulse Gen Serial Number: 9002800

## 2020-05-08 NOTE — Progress Notes (Signed)
Remote pacemaker transmission.   

## 2020-05-10 ENCOUNTER — Encounter (INDEPENDENT_AMBULATORY_CARE_PROVIDER_SITE_OTHER): Payer: Medicare Other | Admitting: Ophthalmology

## 2020-05-10 ENCOUNTER — Other Ambulatory Visit: Payer: Self-pay

## 2020-05-10 DIAGNOSIS — E113393 Type 2 diabetes mellitus with moderate nonproliferative diabetic retinopathy without macular edema, bilateral: Secondary | ICD-10-CM

## 2020-05-10 DIAGNOSIS — E11319 Type 2 diabetes mellitus with unspecified diabetic retinopathy without macular edema: Secondary | ICD-10-CM | POA: Diagnosis not present

## 2020-05-10 DIAGNOSIS — H43813 Vitreous degeneration, bilateral: Secondary | ICD-10-CM | POA: Diagnosis not present

## 2020-05-10 DIAGNOSIS — H35033 Hypertensive retinopathy, bilateral: Secondary | ICD-10-CM

## 2020-05-10 DIAGNOSIS — I10 Essential (primary) hypertension: Secondary | ICD-10-CM

## 2020-05-11 ENCOUNTER — Encounter (INDEPENDENT_AMBULATORY_CARE_PROVIDER_SITE_OTHER): Payer: Medicare Other | Admitting: Ophthalmology

## 2020-05-11 DIAGNOSIS — G4730 Sleep apnea, unspecified: Secondary | ICD-10-CM | POA: Diagnosis not present

## 2020-05-12 DIAGNOSIS — G4730 Sleep apnea, unspecified: Secondary | ICD-10-CM | POA: Diagnosis not present

## 2020-07-28 DIAGNOSIS — E7849 Other hyperlipidemia: Secondary | ICD-10-CM | POA: Diagnosis not present

## 2020-07-28 DIAGNOSIS — E1151 Type 2 diabetes mellitus with diabetic peripheral angiopathy without gangrene: Secondary | ICD-10-CM | POA: Diagnosis not present

## 2020-08-01 DIAGNOSIS — R4182 Altered mental status, unspecified: Secondary | ICD-10-CM | POA: Diagnosis not present

## 2020-08-01 DIAGNOSIS — M6281 Muscle weakness (generalized): Secondary | ICD-10-CM | POA: Diagnosis not present

## 2020-08-01 DIAGNOSIS — R300 Dysuria: Secondary | ICD-10-CM | POA: Diagnosis not present

## 2020-08-03 ENCOUNTER — Ambulatory Visit (INDEPENDENT_AMBULATORY_CARE_PROVIDER_SITE_OTHER): Payer: Medicare Other | Admitting: *Deleted

## 2020-08-03 DIAGNOSIS — I4821 Permanent atrial fibrillation: Secondary | ICD-10-CM

## 2020-08-04 DIAGNOSIS — R531 Weakness: Secondary | ICD-10-CM | POA: Diagnosis not present

## 2020-08-04 DIAGNOSIS — R001 Bradycardia, unspecified: Secondary | ICD-10-CM | POA: Diagnosis not present

## 2020-08-04 DIAGNOSIS — G309 Alzheimer's disease, unspecified: Secondary | ICD-10-CM | POA: Diagnosis not present

## 2020-08-04 DIAGNOSIS — R350 Frequency of micturition: Secondary | ICD-10-CM | POA: Diagnosis not present

## 2020-08-04 DIAGNOSIS — R410 Disorientation, unspecified: Secondary | ICD-10-CM | POA: Diagnosis not present

## 2020-08-04 LAB — CUP PACEART REMOTE DEVICE CHECK
Battery Remaining Longevity: 122 mo
Battery Remaining Percentage: 95.5 %
Battery Voltage: 3.02 V
Brady Statistic RV Percent Paced: 92 %
Date Time Interrogation Session: 20210812020019
Implantable Lead Implant Date: 20190717
Implantable Lead Location: 753860
Implantable Pulse Generator Implant Date: 20190717
Lead Channel Impedance Value: 490 Ohm
Lead Channel Pacing Threshold Amplitude: 0.5 V
Lead Channel Pacing Threshold Pulse Width: 0.5 ms
Lead Channel Sensing Intrinsic Amplitude: 12 mV
Lead Channel Setting Pacing Amplitude: 2.5 V
Lead Channel Setting Pacing Pulse Width: 0.5 ms
Lead Channel Setting Sensing Sensitivity: 2 mV
Pulse Gen Model: 1272
Pulse Gen Serial Number: 9002800

## 2020-08-07 NOTE — Progress Notes (Signed)
Remote pacemaker transmission.   

## 2020-08-15 DIAGNOSIS — R269 Unspecified abnormalities of gait and mobility: Secondary | ICD-10-CM | POA: Diagnosis not present

## 2020-08-15 DIAGNOSIS — I4892 Unspecified atrial flutter: Secondary | ICD-10-CM | POA: Diagnosis not present

## 2020-08-15 DIAGNOSIS — R634 Abnormal weight loss: Secondary | ICD-10-CM | POA: Diagnosis not present

## 2020-08-15 DIAGNOSIS — E46 Unspecified protein-calorie malnutrition: Secondary | ICD-10-CM | POA: Diagnosis not present

## 2020-08-15 DIAGNOSIS — R296 Repeated falls: Secondary | ICD-10-CM | POA: Diagnosis not present

## 2020-08-15 DIAGNOSIS — G309 Alzheimer's disease, unspecified: Secondary | ICD-10-CM | POA: Diagnosis not present

## 2020-08-15 DIAGNOSIS — R410 Disorientation, unspecified: Secondary | ICD-10-CM | POA: Diagnosis not present

## 2020-08-16 DIAGNOSIS — R32 Unspecified urinary incontinence: Secondary | ICD-10-CM | POA: Diagnosis not present

## 2020-08-16 DIAGNOSIS — R159 Full incontinence of feces: Secondary | ICD-10-CM | POA: Diagnosis not present

## 2020-08-16 DIAGNOSIS — G309 Alzheimer's disease, unspecified: Secondary | ICD-10-CM | POA: Diagnosis not present

## 2020-08-16 DIAGNOSIS — I739 Peripheral vascular disease, unspecified: Secondary | ICD-10-CM | POA: Diagnosis not present

## 2020-08-16 DIAGNOSIS — R634 Abnormal weight loss: Secondary | ICD-10-CM | POA: Diagnosis not present

## 2020-08-16 DIAGNOSIS — R609 Edema, unspecified: Secondary | ICD-10-CM | POA: Diagnosis not present

## 2020-08-16 DIAGNOSIS — Z7401 Bed confinement status: Secondary | ICD-10-CM | POA: Diagnosis not present

## 2020-08-16 DIAGNOSIS — F0281 Dementia in other diseases classified elsewhere with behavioral disturbance: Secondary | ICD-10-CM | POA: Diagnosis not present

## 2020-08-16 DIAGNOSIS — E785 Hyperlipidemia, unspecified: Secondary | ICD-10-CM | POA: Diagnosis not present

## 2020-08-16 DIAGNOSIS — I4892 Unspecified atrial flutter: Secondary | ICD-10-CM | POA: Diagnosis not present

## 2020-08-16 DIAGNOSIS — E1142 Type 2 diabetes mellitus with diabetic polyneuropathy: Secondary | ICD-10-CM | POA: Diagnosis not present

## 2020-08-16 DIAGNOSIS — I10 Essential (primary) hypertension: Secondary | ICD-10-CM | POA: Diagnosis not present

## 2020-08-16 DIAGNOSIS — Z681 Body mass index (BMI) 19 or less, adult: Secondary | ICD-10-CM | POA: Diagnosis not present

## 2020-08-16 DIAGNOSIS — F329 Major depressive disorder, single episode, unspecified: Secondary | ICD-10-CM | POA: Diagnosis not present

## 2020-08-17 DIAGNOSIS — F329 Major depressive disorder, single episode, unspecified: Secondary | ICD-10-CM | POA: Diagnosis not present

## 2020-08-17 DIAGNOSIS — E1142 Type 2 diabetes mellitus with diabetic polyneuropathy: Secondary | ICD-10-CM | POA: Diagnosis not present

## 2020-08-17 DIAGNOSIS — R609 Edema, unspecified: Secondary | ICD-10-CM | POA: Diagnosis not present

## 2020-08-17 DIAGNOSIS — F0281 Dementia in other diseases classified elsewhere with behavioral disturbance: Secondary | ICD-10-CM | POA: Diagnosis not present

## 2020-08-17 DIAGNOSIS — R634 Abnormal weight loss: Secondary | ICD-10-CM | POA: Diagnosis not present

## 2020-08-17 DIAGNOSIS — G309 Alzheimer's disease, unspecified: Secondary | ICD-10-CM | POA: Diagnosis not present

## 2020-08-23 DIAGNOSIS — F329 Major depressive disorder, single episode, unspecified: Secondary | ICD-10-CM | POA: Diagnosis not present

## 2020-08-23 DIAGNOSIS — Z681 Body mass index (BMI) 19 or less, adult: Secondary | ICD-10-CM | POA: Diagnosis not present

## 2020-08-23 DIAGNOSIS — R159 Full incontinence of feces: Secondary | ICD-10-CM | POA: Diagnosis not present

## 2020-08-23 DIAGNOSIS — I739 Peripheral vascular disease, unspecified: Secondary | ICD-10-CM | POA: Diagnosis not present

## 2020-08-23 DIAGNOSIS — R32 Unspecified urinary incontinence: Secondary | ICD-10-CM | POA: Diagnosis not present

## 2020-08-23 DIAGNOSIS — F0281 Dementia in other diseases classified elsewhere with behavioral disturbance: Secondary | ICD-10-CM | POA: Diagnosis not present

## 2020-08-23 DIAGNOSIS — Z7401 Bed confinement status: Secondary | ICD-10-CM | POA: Diagnosis not present

## 2020-08-23 DIAGNOSIS — I10 Essential (primary) hypertension: Secondary | ICD-10-CM | POA: Diagnosis not present

## 2020-08-23 DIAGNOSIS — R634 Abnormal weight loss: Secondary | ICD-10-CM | POA: Diagnosis not present

## 2020-08-23 DIAGNOSIS — R609 Edema, unspecified: Secondary | ICD-10-CM | POA: Diagnosis not present

## 2020-08-23 DIAGNOSIS — E785 Hyperlipidemia, unspecified: Secondary | ICD-10-CM | POA: Diagnosis not present

## 2020-08-23 DIAGNOSIS — G309 Alzheimer's disease, unspecified: Secondary | ICD-10-CM | POA: Diagnosis not present

## 2020-08-23 DIAGNOSIS — I4892 Unspecified atrial flutter: Secondary | ICD-10-CM | POA: Diagnosis not present

## 2020-08-23 DIAGNOSIS — E1142 Type 2 diabetes mellitus with diabetic polyneuropathy: Secondary | ICD-10-CM | POA: Diagnosis not present

## 2020-08-25 DIAGNOSIS — F0281 Dementia in other diseases classified elsewhere with behavioral disturbance: Secondary | ICD-10-CM | POA: Diagnosis not present

## 2020-08-25 DIAGNOSIS — E1142 Type 2 diabetes mellitus with diabetic polyneuropathy: Secondary | ICD-10-CM | POA: Diagnosis not present

## 2020-08-25 DIAGNOSIS — G309 Alzheimer's disease, unspecified: Secondary | ICD-10-CM | POA: Diagnosis not present

## 2020-08-25 DIAGNOSIS — F329 Major depressive disorder, single episode, unspecified: Secondary | ICD-10-CM | POA: Diagnosis not present

## 2020-08-25 DIAGNOSIS — R609 Edema, unspecified: Secondary | ICD-10-CM | POA: Diagnosis not present

## 2020-08-25 DIAGNOSIS — R634 Abnormal weight loss: Secondary | ICD-10-CM | POA: Diagnosis not present

## 2020-08-29 DIAGNOSIS — R634 Abnormal weight loss: Secondary | ICD-10-CM | POA: Diagnosis not present

## 2020-08-29 DIAGNOSIS — F0281 Dementia in other diseases classified elsewhere with behavioral disturbance: Secondary | ICD-10-CM | POA: Diagnosis not present

## 2020-08-29 DIAGNOSIS — R609 Edema, unspecified: Secondary | ICD-10-CM | POA: Diagnosis not present

## 2020-08-29 DIAGNOSIS — F329 Major depressive disorder, single episode, unspecified: Secondary | ICD-10-CM | POA: Diagnosis not present

## 2020-08-29 DIAGNOSIS — G309 Alzheimer's disease, unspecified: Secondary | ICD-10-CM | POA: Diagnosis not present

## 2020-08-29 DIAGNOSIS — E1142 Type 2 diabetes mellitus with diabetic polyneuropathy: Secondary | ICD-10-CM | POA: Diagnosis not present

## 2020-08-30 DIAGNOSIS — R634 Abnormal weight loss: Secondary | ICD-10-CM | POA: Diagnosis not present

## 2020-08-30 DIAGNOSIS — R609 Edema, unspecified: Secondary | ICD-10-CM | POA: Diagnosis not present

## 2020-08-30 DIAGNOSIS — G309 Alzheimer's disease, unspecified: Secondary | ICD-10-CM | POA: Diagnosis not present

## 2020-08-30 DIAGNOSIS — F329 Major depressive disorder, single episode, unspecified: Secondary | ICD-10-CM | POA: Diagnosis not present

## 2020-08-30 DIAGNOSIS — E1142 Type 2 diabetes mellitus with diabetic polyneuropathy: Secondary | ICD-10-CM | POA: Diagnosis not present

## 2020-08-30 DIAGNOSIS — F0281 Dementia in other diseases classified elsewhere with behavioral disturbance: Secondary | ICD-10-CM | POA: Diagnosis not present

## 2020-08-31 DIAGNOSIS — R609 Edema, unspecified: Secondary | ICD-10-CM | POA: Diagnosis not present

## 2020-08-31 DIAGNOSIS — E1142 Type 2 diabetes mellitus with diabetic polyneuropathy: Secondary | ICD-10-CM | POA: Diagnosis not present

## 2020-08-31 DIAGNOSIS — G309 Alzheimer's disease, unspecified: Secondary | ICD-10-CM | POA: Diagnosis not present

## 2020-08-31 DIAGNOSIS — F329 Major depressive disorder, single episode, unspecified: Secondary | ICD-10-CM | POA: Diagnosis not present

## 2020-08-31 DIAGNOSIS — R634 Abnormal weight loss: Secondary | ICD-10-CM | POA: Diagnosis not present

## 2020-08-31 DIAGNOSIS — F0281 Dementia in other diseases classified elsewhere with behavioral disturbance: Secondary | ICD-10-CM | POA: Diagnosis not present

## 2020-09-01 DIAGNOSIS — R609 Edema, unspecified: Secondary | ICD-10-CM | POA: Diagnosis not present

## 2020-09-01 DIAGNOSIS — F329 Major depressive disorder, single episode, unspecified: Secondary | ICD-10-CM | POA: Diagnosis not present

## 2020-09-01 DIAGNOSIS — E1142 Type 2 diabetes mellitus with diabetic polyneuropathy: Secondary | ICD-10-CM | POA: Diagnosis not present

## 2020-09-01 DIAGNOSIS — F0281 Dementia in other diseases classified elsewhere with behavioral disturbance: Secondary | ICD-10-CM | POA: Diagnosis not present

## 2020-09-01 DIAGNOSIS — G309 Alzheimer's disease, unspecified: Secondary | ICD-10-CM | POA: Diagnosis not present

## 2020-09-01 DIAGNOSIS — R634 Abnormal weight loss: Secondary | ICD-10-CM | POA: Diagnosis not present

## 2020-09-02 DIAGNOSIS — F0281 Dementia in other diseases classified elsewhere with behavioral disturbance: Secondary | ICD-10-CM | POA: Diagnosis not present

## 2020-09-02 DIAGNOSIS — E1142 Type 2 diabetes mellitus with diabetic polyneuropathy: Secondary | ICD-10-CM | POA: Diagnosis not present

## 2020-09-02 DIAGNOSIS — R634 Abnormal weight loss: Secondary | ICD-10-CM | POA: Diagnosis not present

## 2020-09-02 DIAGNOSIS — F329 Major depressive disorder, single episode, unspecified: Secondary | ICD-10-CM | POA: Diagnosis not present

## 2020-09-02 DIAGNOSIS — G309 Alzheimer's disease, unspecified: Secondary | ICD-10-CM | POA: Diagnosis not present

## 2020-09-02 DIAGNOSIS — R609 Edema, unspecified: Secondary | ICD-10-CM | POA: Diagnosis not present

## 2020-09-03 DIAGNOSIS — E1142 Type 2 diabetes mellitus with diabetic polyneuropathy: Secondary | ICD-10-CM | POA: Diagnosis not present

## 2020-09-03 DIAGNOSIS — G309 Alzheimer's disease, unspecified: Secondary | ICD-10-CM | POA: Diagnosis not present

## 2020-09-03 DIAGNOSIS — R609 Edema, unspecified: Secondary | ICD-10-CM | POA: Diagnosis not present

## 2020-09-03 DIAGNOSIS — F329 Major depressive disorder, single episode, unspecified: Secondary | ICD-10-CM | POA: Diagnosis not present

## 2020-09-03 DIAGNOSIS — F0281 Dementia in other diseases classified elsewhere with behavioral disturbance: Secondary | ICD-10-CM | POA: Diagnosis not present

## 2020-09-03 DIAGNOSIS — R634 Abnormal weight loss: Secondary | ICD-10-CM | POA: Diagnosis not present

## 2020-09-04 DIAGNOSIS — E1142 Type 2 diabetes mellitus with diabetic polyneuropathy: Secondary | ICD-10-CM | POA: Diagnosis not present

## 2020-09-04 DIAGNOSIS — G309 Alzheimer's disease, unspecified: Secondary | ICD-10-CM | POA: Diagnosis not present

## 2020-09-04 DIAGNOSIS — F0281 Dementia in other diseases classified elsewhere with behavioral disturbance: Secondary | ICD-10-CM | POA: Diagnosis not present

## 2020-09-04 DIAGNOSIS — R609 Edema, unspecified: Secondary | ICD-10-CM | POA: Diagnosis not present

## 2020-09-04 DIAGNOSIS — R634 Abnormal weight loss: Secondary | ICD-10-CM | POA: Diagnosis not present

## 2020-09-04 DIAGNOSIS — F329 Major depressive disorder, single episode, unspecified: Secondary | ICD-10-CM | POA: Diagnosis not present

## 2020-09-05 DIAGNOSIS — R609 Edema, unspecified: Secondary | ICD-10-CM | POA: Diagnosis not present

## 2020-09-05 DIAGNOSIS — F0281 Dementia in other diseases classified elsewhere with behavioral disturbance: Secondary | ICD-10-CM | POA: Diagnosis not present

## 2020-09-05 DIAGNOSIS — R634 Abnormal weight loss: Secondary | ICD-10-CM | POA: Diagnosis not present

## 2020-09-05 DIAGNOSIS — F329 Major depressive disorder, single episode, unspecified: Secondary | ICD-10-CM | POA: Diagnosis not present

## 2020-09-05 DIAGNOSIS — E1142 Type 2 diabetes mellitus with diabetic polyneuropathy: Secondary | ICD-10-CM | POA: Diagnosis not present

## 2020-09-05 DIAGNOSIS — G309 Alzheimer's disease, unspecified: Secondary | ICD-10-CM | POA: Diagnosis not present

## 2020-09-06 DIAGNOSIS — F0281 Dementia in other diseases classified elsewhere with behavioral disturbance: Secondary | ICD-10-CM | POA: Diagnosis not present

## 2020-09-06 DIAGNOSIS — E1142 Type 2 diabetes mellitus with diabetic polyneuropathy: Secondary | ICD-10-CM | POA: Diagnosis not present

## 2020-09-06 DIAGNOSIS — R609 Edema, unspecified: Secondary | ICD-10-CM | POA: Diagnosis not present

## 2020-09-06 DIAGNOSIS — R634 Abnormal weight loss: Secondary | ICD-10-CM | POA: Diagnosis not present

## 2020-09-06 DIAGNOSIS — G309 Alzheimer's disease, unspecified: Secondary | ICD-10-CM | POA: Diagnosis not present

## 2020-09-06 DIAGNOSIS — F329 Major depressive disorder, single episode, unspecified: Secondary | ICD-10-CM | POA: Diagnosis not present

## 2020-09-08 DIAGNOSIS — G309 Alzheimer's disease, unspecified: Secondary | ICD-10-CM | POA: Diagnosis not present

## 2020-09-08 DIAGNOSIS — R609 Edema, unspecified: Secondary | ICD-10-CM | POA: Diagnosis not present

## 2020-09-08 DIAGNOSIS — F0281 Dementia in other diseases classified elsewhere with behavioral disturbance: Secondary | ICD-10-CM | POA: Diagnosis not present

## 2020-09-08 DIAGNOSIS — E1142 Type 2 diabetes mellitus with diabetic polyneuropathy: Secondary | ICD-10-CM | POA: Diagnosis not present

## 2020-09-08 DIAGNOSIS — F329 Major depressive disorder, single episode, unspecified: Secondary | ICD-10-CM | POA: Diagnosis not present

## 2020-09-08 DIAGNOSIS — R634 Abnormal weight loss: Secondary | ICD-10-CM | POA: Diagnosis not present

## 2020-09-11 DIAGNOSIS — F329 Major depressive disorder, single episode, unspecified: Secondary | ICD-10-CM | POA: Diagnosis not present

## 2020-09-11 DIAGNOSIS — R634 Abnormal weight loss: Secondary | ICD-10-CM | POA: Diagnosis not present

## 2020-09-11 DIAGNOSIS — G309 Alzheimer's disease, unspecified: Secondary | ICD-10-CM | POA: Diagnosis not present

## 2020-09-11 DIAGNOSIS — E1142 Type 2 diabetes mellitus with diabetic polyneuropathy: Secondary | ICD-10-CM | POA: Diagnosis not present

## 2020-09-11 DIAGNOSIS — R609 Edema, unspecified: Secondary | ICD-10-CM | POA: Diagnosis not present

## 2020-09-11 DIAGNOSIS — F0281 Dementia in other diseases classified elsewhere with behavioral disturbance: Secondary | ICD-10-CM | POA: Diagnosis not present

## 2020-09-13 DIAGNOSIS — E1142 Type 2 diabetes mellitus with diabetic polyneuropathy: Secondary | ICD-10-CM | POA: Diagnosis not present

## 2020-09-13 DIAGNOSIS — R634 Abnormal weight loss: Secondary | ICD-10-CM | POA: Diagnosis not present

## 2020-09-13 DIAGNOSIS — F329 Major depressive disorder, single episode, unspecified: Secondary | ICD-10-CM | POA: Diagnosis not present

## 2020-09-13 DIAGNOSIS — F0281 Dementia in other diseases classified elsewhere with behavioral disturbance: Secondary | ICD-10-CM | POA: Diagnosis not present

## 2020-09-13 DIAGNOSIS — R609 Edema, unspecified: Secondary | ICD-10-CM | POA: Diagnosis not present

## 2020-09-13 DIAGNOSIS — G309 Alzheimer's disease, unspecified: Secondary | ICD-10-CM | POA: Diagnosis not present

## 2020-09-14 DIAGNOSIS — F0281 Dementia in other diseases classified elsewhere with behavioral disturbance: Secondary | ICD-10-CM | POA: Diagnosis not present

## 2020-09-14 DIAGNOSIS — R634 Abnormal weight loss: Secondary | ICD-10-CM | POA: Diagnosis not present

## 2020-09-14 DIAGNOSIS — E1142 Type 2 diabetes mellitus with diabetic polyneuropathy: Secondary | ICD-10-CM | POA: Diagnosis not present

## 2020-09-14 DIAGNOSIS — R609 Edema, unspecified: Secondary | ICD-10-CM | POA: Diagnosis not present

## 2020-09-14 DIAGNOSIS — F329 Major depressive disorder, single episode, unspecified: Secondary | ICD-10-CM | POA: Diagnosis not present

## 2020-09-14 DIAGNOSIS — G309 Alzheimer's disease, unspecified: Secondary | ICD-10-CM | POA: Diagnosis not present

## 2020-09-15 DIAGNOSIS — F0281 Dementia in other diseases classified elsewhere with behavioral disturbance: Secondary | ICD-10-CM | POA: Diagnosis not present

## 2020-09-15 DIAGNOSIS — G309 Alzheimer's disease, unspecified: Secondary | ICD-10-CM | POA: Diagnosis not present

## 2020-09-15 DIAGNOSIS — F329 Major depressive disorder, single episode, unspecified: Secondary | ICD-10-CM | POA: Diagnosis not present

## 2020-09-15 DIAGNOSIS — R634 Abnormal weight loss: Secondary | ICD-10-CM | POA: Diagnosis not present

## 2020-09-15 DIAGNOSIS — E1142 Type 2 diabetes mellitus with diabetic polyneuropathy: Secondary | ICD-10-CM | POA: Diagnosis not present

## 2020-09-15 DIAGNOSIS — R609 Edema, unspecified: Secondary | ICD-10-CM | POA: Diagnosis not present

## 2020-09-18 DIAGNOSIS — R609 Edema, unspecified: Secondary | ICD-10-CM | POA: Diagnosis not present

## 2020-09-18 DIAGNOSIS — E1142 Type 2 diabetes mellitus with diabetic polyneuropathy: Secondary | ICD-10-CM | POA: Diagnosis not present

## 2020-09-18 DIAGNOSIS — F329 Major depressive disorder, single episode, unspecified: Secondary | ICD-10-CM | POA: Diagnosis not present

## 2020-09-18 DIAGNOSIS — G309 Alzheimer's disease, unspecified: Secondary | ICD-10-CM | POA: Diagnosis not present

## 2020-09-18 DIAGNOSIS — R634 Abnormal weight loss: Secondary | ICD-10-CM | POA: Diagnosis not present

## 2020-09-18 DIAGNOSIS — F0281 Dementia in other diseases classified elsewhere with behavioral disturbance: Secondary | ICD-10-CM | POA: Diagnosis not present

## 2020-09-19 DIAGNOSIS — F0281 Dementia in other diseases classified elsewhere with behavioral disturbance: Secondary | ICD-10-CM | POA: Diagnosis not present

## 2020-09-19 DIAGNOSIS — F329 Major depressive disorder, single episode, unspecified: Secondary | ICD-10-CM | POA: Diagnosis not present

## 2020-09-19 DIAGNOSIS — G309 Alzheimer's disease, unspecified: Secondary | ICD-10-CM | POA: Diagnosis not present

## 2020-09-19 DIAGNOSIS — R609 Edema, unspecified: Secondary | ICD-10-CM | POA: Diagnosis not present

## 2020-09-19 DIAGNOSIS — E1142 Type 2 diabetes mellitus with diabetic polyneuropathy: Secondary | ICD-10-CM | POA: Diagnosis not present

## 2020-09-19 DIAGNOSIS — R634 Abnormal weight loss: Secondary | ICD-10-CM | POA: Diagnosis not present

## 2020-09-22 DEATH — deceased

## 2020-09-25 ENCOUNTER — Telehealth: Payer: Self-pay | Admitting: Adult Health

## 2020-09-25 NOTE — Telephone Encounter (Signed)
I am sorry to hear of her passing. CD

## 2020-09-25 NOTE — Telephone Encounter (Signed)
Pt's daughter called to inform us that the pt has passed away.

## 2020-10-26 ENCOUNTER — Ambulatory Visit: Payer: Medicare Other | Admitting: Adult Health

## 2020-11-20 ENCOUNTER — Encounter (INDEPENDENT_AMBULATORY_CARE_PROVIDER_SITE_OTHER): Payer: Medicare Other | Admitting: Ophthalmology
